# Patient Record
Sex: Female | Born: 1940
Health system: Southern US, Community
[De-identification: ages and names within clinical notes are randomized; demographics above are authoritative.]

## PROBLEM LIST (undated history)

## (undated) DIAGNOSIS — Z1211 Encounter for screening for malignant neoplasm of colon: Secondary | ICD-10-CM

## (undated) DIAGNOSIS — T7840XA Allergy, unspecified, initial encounter: Secondary | ICD-10-CM

## (undated) DIAGNOSIS — M8580 Other specified disorders of bone density and structure, unspecified site: Secondary | ICD-10-CM

## (undated) DIAGNOSIS — D721 Eosinophilia: Secondary | ICD-10-CM

## (undated) DIAGNOSIS — J45909 Unspecified asthma, uncomplicated: Secondary | ICD-10-CM

## (undated) DIAGNOSIS — M810 Age-related osteoporosis without current pathological fracture: Secondary | ICD-10-CM

## (undated) DIAGNOSIS — R011 Cardiac murmur, unspecified: Secondary | ICD-10-CM

## (undated) DIAGNOSIS — J309 Allergic rhinitis, unspecified: Secondary | ICD-10-CM

## (undated) DIAGNOSIS — I10 Essential (primary) hypertension: Secondary | ICD-10-CM

## (undated) DIAGNOSIS — C4492 Squamous cell carcinoma of skin, unspecified: Secondary | ICD-10-CM

## (undated) DIAGNOSIS — E538 Deficiency of other specified B group vitamins: Secondary | ICD-10-CM

## (undated) DIAGNOSIS — H269 Unspecified cataract: Secondary | ICD-10-CM

## (undated) DIAGNOSIS — C50312 Malignant neoplasm of lower-inner quadrant of left female breast: Secondary | ICD-10-CM

## (undated) DIAGNOSIS — C4491 Basal cell carcinoma of skin, unspecified: Secondary | ICD-10-CM

## (undated) DIAGNOSIS — I493 Ventricular premature depolarization: Secondary | ICD-10-CM

## (undated) DIAGNOSIS — D219 Benign neoplasm of connective and other soft tissue, unspecified: Secondary | ICD-10-CM

## (undated) DIAGNOSIS — C50919 Malignant neoplasm of unspecified site of unspecified female breast: Secondary | ICD-10-CM

## (undated) DIAGNOSIS — Z8719 Personal history of other diseases of the digestive system: Secondary | ICD-10-CM

## (undated) DIAGNOSIS — E785 Hyperlipidemia, unspecified: Secondary | ICD-10-CM

## (undated) HISTORY — DX: Age-related osteoporosis without current pathological fracture: M81.0

## (undated) HISTORY — DX: Basal cell carcinoma of skin, unspecified: C44.91

## (undated) HISTORY — DX: Hyperlipidemia, unspecified: E78.5

## (undated) HISTORY — PX: BREAST SURGERY: SHX581

## (undated) HISTORY — PX: OTHER SURGICAL HISTORY: SHX169

## (undated) HISTORY — DX: Eosinophilia: D72.1

## (undated) HISTORY — PX: FOOT SURGERY: SHX648

## (undated) HISTORY — DX: Essential (primary) hypertension: I10

## (undated) HISTORY — DX: Allergy, unspecified, initial encounter: T78.40XA

## (undated) HISTORY — DX: Malignant neoplasm of lower-inner quadrant of left female breast: C50.312

## (undated) HISTORY — PX: KNEE ARTHROSCOPY: SUR90

## (undated) HISTORY — DX: Squamous cell carcinoma of skin, unspecified: C44.92

## (undated) HISTORY — DX: Personal history of other diseases of the digestive system: Z87.19

## (undated) HISTORY — DX: Ventricular premature depolarization: I49.3

## (undated) HISTORY — DX: Malignant neoplasm of unspecified site of unspecified female breast: C50.919

## (undated) HISTORY — DX: Other specified disorders of bone density and structure, unspecified site: M85.80

## (undated) HISTORY — DX: Benign neoplasm of connective and other soft tissue, unspecified: D21.9

## (undated) HISTORY — PX: NEUROMA SURGERY: SHX722

## (undated) HISTORY — DX: Unspecified cataract: H26.9

## (undated) HISTORY — DX: Cardiac murmur, unspecified: R01.1

## (undated) HISTORY — DX: Allergic rhinitis, unspecified: J30.9

## (undated) HISTORY — DX: Deficiency of other specified B group vitamins: E53.8

## (undated) HISTORY — DX: Encounter for screening for malignant neoplasm of colon: Z12.11

---

## 1998-05-02 ENCOUNTER — Other Ambulatory Visit: Admission: RE | Admit: 1998-05-02 | Discharge: 1998-05-02 | Payer: Self-pay | Admitting: *Deleted

## 1999-04-28 ENCOUNTER — Other Ambulatory Visit: Admission: RE | Admit: 1999-04-28 | Discharge: 1999-04-28 | Payer: Self-pay | Admitting: *Deleted

## 1999-04-29 ENCOUNTER — Ambulatory Visit (HOSPITAL_COMMUNITY): Admission: RE | Admit: 1999-04-29 | Discharge: 1999-04-29 | Payer: Self-pay | Admitting: Internal Medicine

## 1999-04-29 ENCOUNTER — Encounter: Payer: Self-pay | Admitting: Internal Medicine

## 2000-07-16 ENCOUNTER — Other Ambulatory Visit: Admission: RE | Admit: 2000-07-16 | Discharge: 2000-07-16 | Payer: Self-pay | Admitting: *Deleted

## 2001-01-25 ENCOUNTER — Encounter: Admission: RE | Admit: 2001-01-25 | Discharge: 2001-01-25 | Payer: Self-pay | Admitting: *Deleted

## 2001-01-25 ENCOUNTER — Encounter: Payer: Self-pay | Admitting: *Deleted

## 2002-08-24 ENCOUNTER — Encounter: Payer: Self-pay | Admitting: Internal Medicine

## 2003-05-02 ENCOUNTER — Other Ambulatory Visit: Admission: RE | Admit: 2003-05-02 | Discharge: 2003-05-02 | Payer: Self-pay | Admitting: Internal Medicine

## 2003-06-02 HISTORY — PX: COLONOSCOPY: SHX174

## 2003-06-13 ENCOUNTER — Encounter: Payer: Self-pay | Admitting: Internal Medicine

## 2003-06-13 LAB — HM COLONOSCOPY

## 2004-10-07 ENCOUNTER — Ambulatory Visit: Payer: Self-pay | Admitting: Internal Medicine

## 2004-10-07 ENCOUNTER — Other Ambulatory Visit: Admission: RE | Admit: 2004-10-07 | Discharge: 2004-10-07 | Payer: Self-pay | Admitting: Internal Medicine

## 2004-10-21 ENCOUNTER — Ambulatory Visit: Payer: Self-pay | Admitting: Internal Medicine

## 2004-12-15 ENCOUNTER — Ambulatory Visit: Payer: Self-pay | Admitting: Internal Medicine

## 2005-03-30 ENCOUNTER — Ambulatory Visit: Payer: Self-pay | Admitting: Internal Medicine

## 2005-05-22 ENCOUNTER — Ambulatory Visit: Payer: Self-pay | Admitting: Internal Medicine

## 2005-07-06 ENCOUNTER — Ambulatory Visit: Payer: Self-pay | Admitting: Internal Medicine

## 2005-07-24 ENCOUNTER — Ambulatory Visit: Payer: Self-pay | Admitting: Internal Medicine

## 2005-07-29 ENCOUNTER — Ambulatory Visit: Payer: Self-pay | Admitting: Internal Medicine

## 2005-08-03 ENCOUNTER — Ambulatory Visit: Payer: Self-pay | Admitting: Cardiology

## 2005-10-27 ENCOUNTER — Ambulatory Visit: Payer: Self-pay | Admitting: Internal Medicine

## 2005-11-26 ENCOUNTER — Ambulatory Visit: Payer: Self-pay | Admitting: Internal Medicine

## 2005-11-26 ENCOUNTER — Other Ambulatory Visit: Admission: RE | Admit: 2005-11-26 | Discharge: 2005-11-26 | Payer: Self-pay | Admitting: Internal Medicine

## 2005-11-26 ENCOUNTER — Encounter: Payer: Self-pay | Admitting: Internal Medicine

## 2005-11-26 LAB — CONVERTED CEMR LAB: Pap Smear: NORMAL

## 2006-05-10 ENCOUNTER — Ambulatory Visit: Payer: Self-pay | Admitting: Internal Medicine

## 2006-05-19 ENCOUNTER — Ambulatory Visit: Payer: Self-pay | Admitting: Internal Medicine

## 2006-05-21 ENCOUNTER — Encounter: Payer: Self-pay | Admitting: Internal Medicine

## 2006-06-16 ENCOUNTER — Ambulatory Visit: Payer: Self-pay | Admitting: Internal Medicine

## 2006-12-15 ENCOUNTER — Ambulatory Visit: Payer: Self-pay | Admitting: Internal Medicine

## 2006-12-20 DIAGNOSIS — M810 Age-related osteoporosis without current pathological fracture: Secondary | ICD-10-CM

## 2006-12-20 DIAGNOSIS — E785 Hyperlipidemia, unspecified: Secondary | ICD-10-CM | POA: Insufficient documentation

## 2006-12-20 DIAGNOSIS — M8580 Other specified disorders of bone density and structure, unspecified site: Secondary | ICD-10-CM | POA: Insufficient documentation

## 2006-12-20 HISTORY — DX: Age-related osteoporosis without current pathological fracture: M81.0

## 2006-12-20 HISTORY — DX: Hyperlipidemia, unspecified: E78.5

## 2007-01-12 ENCOUNTER — Ambulatory Visit: Payer: Self-pay | Admitting: Internal Medicine

## 2007-01-12 LAB — CONVERTED CEMR LAB
ALT: 17 units/L (ref 0–35)
AST: 20 units/L (ref 0–37)
Albumin: 4 g/dL (ref 3.5–5.2)
Alkaline Phosphatase: 55 units/L (ref 39–117)
BUN: 12 mg/dL (ref 6–23)
Basophils Absolute: 0 10*3/uL (ref 0.0–0.1)
Basophils Relative: 0.2 % (ref 0.0–1.0)
Bilirubin Urine: NEGATIVE
Bilirubin, Direct: 0.1 mg/dL (ref 0.0–0.3)
Blood in Urine, dipstick: NEGATIVE
CO2: 28 meq/L (ref 19–32)
Calcium: 9.4 mg/dL (ref 8.4–10.5)
Chloride: 107 meq/L (ref 96–112)
Cholesterol: 219 mg/dL (ref 0–200)
Creatinine, Ser: 1 mg/dL (ref 0.4–1.2)
Direct LDL: 140.8 mg/dL
Eosinophils Absolute: 0.3 10*3/uL (ref 0.0–0.6)
Eosinophils Relative: 5.9 % — ABNORMAL HIGH (ref 0.0–5.0)
GFR calc Af Amer: 71 mL/min
GFR calc non Af Amer: 59 mL/min
Glucose, Bld: 95 mg/dL (ref 70–99)
Glucose, Urine, Semiquant: NEGATIVE
HCT: 37.5 % (ref 36.0–46.0)
HDL: 55.9 mg/dL (ref >39.0)
Hemoglobin: 13.1 g/dL (ref 12.0–15.0)
Ketones, urine, test strip: NEGATIVE
Lymphocytes Relative: 27 % (ref 12.0–46.0)
MCHC: 35 g/dL (ref 30.0–36.0)
MCV: 90.1 fL (ref 78.0–100.0)
Monocytes Absolute: 0.4 10*3/uL (ref 0.2–0.7)
Monocytes Relative: 7.6 % (ref 3.0–11.0)
Neutro Abs: 2.9 10*3/uL (ref 1.4–7.7)
Neutrophils Relative %: 59.3 % (ref 43.0–77.0)
Nitrite: NEGATIVE
Platelets: 292 10*3/uL (ref 150–400)
Potassium: 4 meq/L (ref 3.5–5.1)
Protein, U semiquant: NEGATIVE
RBC: 4.16 M/uL (ref 3.87–5.11)
RDW: 12.1 % (ref 11.5–14.6)
Sodium: 141 meq/L (ref 135–145)
Specific Gravity, Urine: 1.02
TSH: 2.13 microintl units/mL (ref 0.35–5.50)
Total Bilirubin: 1 mg/dL (ref 0.3–1.2)
Total CHOL/HDL Ratio: 3.9
Total Protein: 6.5 g/dL (ref 6.0–8.3)
Triglycerides: 113 mg/dL (ref 0–149)
Urobilinogen, UA: 0.2
VLDL: 23 mg/dL (ref 0–40)
WBC Urine, dipstick: NEGATIVE
WBC: 5 10*3/uL (ref 4.5–10.5)
pH: 6

## 2007-01-19 ENCOUNTER — Encounter: Payer: Self-pay | Admitting: Internal Medicine

## 2007-01-19 ENCOUNTER — Ambulatory Visit: Payer: Self-pay | Admitting: Internal Medicine

## 2007-02-24 ENCOUNTER — Telehealth: Payer: Self-pay | Admitting: Internal Medicine

## 2007-04-14 ENCOUNTER — Telehealth (INDEPENDENT_AMBULATORY_CARE_PROVIDER_SITE_OTHER): Payer: Self-pay | Admitting: *Deleted

## 2007-04-20 ENCOUNTER — Ambulatory Visit: Payer: Self-pay | Admitting: Internal Medicine

## 2007-04-20 ENCOUNTER — Encounter: Payer: Self-pay | Admitting: Internal Medicine

## 2007-07-12 ENCOUNTER — Ambulatory Visit: Payer: Self-pay | Admitting: Internal Medicine

## 2007-08-31 ENCOUNTER — Telehealth: Payer: Self-pay | Admitting: Internal Medicine

## 2007-09-30 ENCOUNTER — Encounter: Payer: Self-pay | Admitting: Internal Medicine

## 2007-09-30 LAB — HM MAMMOGRAPHY: HM Mammogram: NORMAL

## 2007-10-27 ENCOUNTER — Ambulatory Visit: Payer: Self-pay | Admitting: Internal Medicine

## 2008-01-23 ENCOUNTER — Ambulatory Visit: Payer: Self-pay | Admitting: Internal Medicine

## 2008-01-23 LAB — CONVERTED CEMR LAB
AST: 21 units/L (ref 0–37)
Albumin: 4 g/dL (ref 3.5–5.2)
BUN: 11 mg/dL (ref 6–23)
Basophils Relative: 1 % (ref 0.0–3.0)
Creatinine, Ser: 1 mg/dL (ref 0.4–1.2)
Direct LDL: 159.1 mg/dL
Eosinophils Absolute: 0.5 10*3/uL (ref 0.0–0.7)
Eosinophils Relative: 9.3 % — ABNORMAL HIGH (ref 0.0–5.0)
GFR calc Af Amer: 71 mL/min
GFR calc non Af Amer: 59 mL/min
HCT: 39.9 % (ref 36.0–46.0)
Hemoglobin: 13.8 g/dL (ref 12.0–15.0)
Ketones, urine, test strip: NEGATIVE
MCV: 91.8 fL (ref 78.0–100.0)
Monocytes Absolute: 0.4 10*3/uL (ref 0.1–1.0)
Neutro Abs: 2.5 10*3/uL (ref 1.4–7.7)
Nitrite: NEGATIVE
RBC: 4.35 M/uL (ref 3.87–5.11)
Specific Gravity, Urine: 1.015
Total Protein: 6.8 g/dL (ref 6.0–8.3)
Triglycerides: 125 mg/dL (ref 0–149)
WBC Urine, dipstick: NEGATIVE
WBC: 5.1 10*3/uL (ref 4.5–10.5)

## 2008-01-31 ENCOUNTER — Ambulatory Visit: Payer: Self-pay | Admitting: Internal Medicine

## 2008-01-31 DIAGNOSIS — J309 Allergic rhinitis, unspecified: Secondary | ICD-10-CM

## 2008-01-31 HISTORY — DX: Allergic rhinitis, unspecified: J30.9

## 2008-03-20 ENCOUNTER — Telehealth: Payer: Self-pay | Admitting: Internal Medicine

## 2008-03-22 ENCOUNTER — Ambulatory Visit: Payer: Self-pay | Admitting: Internal Medicine

## 2008-03-26 ENCOUNTER — Telehealth: Payer: Self-pay | Admitting: Internal Medicine

## 2008-04-24 ENCOUNTER — Ambulatory Visit: Payer: Self-pay | Admitting: Internal Medicine

## 2008-05-14 ENCOUNTER — Ambulatory Visit: Payer: Self-pay | Admitting: Internal Medicine

## 2008-05-17 ENCOUNTER — Ambulatory Visit: Payer: Self-pay | Admitting: Cardiology

## 2008-06-04 ENCOUNTER — Ambulatory Visit: Payer: Self-pay | Admitting: Internal Medicine

## 2008-06-07 ENCOUNTER — Encounter: Payer: Self-pay | Admitting: Internal Medicine

## 2008-06-26 ENCOUNTER — Encounter: Payer: Self-pay | Admitting: Internal Medicine

## 2008-09-07 ENCOUNTER — Ambulatory Visit: Payer: Self-pay | Admitting: Family Medicine

## 2008-09-07 DIAGNOSIS — Z8719 Personal history of other diseases of the digestive system: Secondary | ICD-10-CM | POA: Insufficient documentation

## 2008-09-07 HISTORY — DX: Personal history of other diseases of the digestive system: Z87.19

## 2008-09-07 LAB — CONVERTED CEMR LAB
Bilirubin Urine: NEGATIVE
Glucose, Urine, Semiquant: NEGATIVE
Urobilinogen, UA: 0.2
WBC Urine, dipstick: NEGATIVE

## 2008-10-01 ENCOUNTER — Encounter: Payer: Self-pay | Admitting: Internal Medicine

## 2008-10-03 ENCOUNTER — Encounter: Payer: Self-pay | Admitting: Internal Medicine

## 2009-01-30 ENCOUNTER — Ambulatory Visit: Payer: Self-pay | Admitting: Internal Medicine

## 2009-01-30 LAB — CONVERTED CEMR LAB
ALT: 18 units/L (ref 0–35)
Albumin: 4 g/dL (ref 3.5–5.2)
Basophils Relative: 0.9 % (ref 0.0–3.0)
CO2: 28 meq/L (ref 19–32)
Chloride: 108 meq/L (ref 96–112)
Cholesterol: 224 mg/dL — ABNORMAL HIGH (ref 0–200)
Creatinine, Ser: 1 mg/dL (ref 0.4–1.2)
Direct LDL: 145 mg/dL
Eosinophils Absolute: 0.6 10*3/uL (ref 0.0–0.7)
Eosinophils Relative: 10.8 % — ABNORMAL HIGH (ref 0.0–5.0)
HCT: 40.5 % (ref 36.0–46.0)
Hemoglobin: 14 g/dL (ref 12.0–15.0)
Lymphs Abs: 1.3 10*3/uL (ref 0.7–4.0)
MCHC: 34.5 g/dL (ref 30.0–36.0)
MCV: 92.2 fL (ref 78.0–100.0)
Monocytes Absolute: 0.4 10*3/uL (ref 0.1–1.0)
Neutro Abs: 2.8 10*3/uL (ref 1.4–7.7)
Neutrophils Relative %: 54.8 % (ref 43.0–77.0)
Potassium: 4.5 meq/L (ref 3.5–5.1)
RBC: 4.39 M/uL (ref 3.87–5.11)
TSH: 2.06 microintl units/mL (ref 0.35–5.50)
Total CHOL/HDL Ratio: 4
Total Protein: 6.6 g/dL (ref 6.0–8.3)
WBC: 5.1 10*3/uL (ref 4.5–10.5)

## 2009-02-08 ENCOUNTER — Ambulatory Visit: Payer: Self-pay | Admitting: Internal Medicine

## 2009-02-08 DIAGNOSIS — D721 Eosinophilia, unspecified: Secondary | ICD-10-CM

## 2009-02-08 HISTORY — DX: Eosinophilia, unspecified: D72.10

## 2009-02-21 ENCOUNTER — Ambulatory Visit: Payer: Self-pay | Admitting: Internal Medicine

## 2009-03-08 ENCOUNTER — Telehealth: Payer: Self-pay | Admitting: Internal Medicine

## 2009-03-08 ENCOUNTER — Ambulatory Visit: Payer: Self-pay | Admitting: Internal Medicine

## 2009-03-09 ENCOUNTER — Encounter: Payer: Self-pay | Admitting: Internal Medicine

## 2009-03-13 ENCOUNTER — Ambulatory Visit: Payer: Self-pay | Admitting: Internal Medicine

## 2009-03-13 ENCOUNTER — Telehealth: Payer: Self-pay | Admitting: Internal Medicine

## 2009-03-14 LAB — CONVERTED CEMR LAB
Basophils Absolute: 0 10*3/uL (ref 0.0–0.1)
Eosinophils Relative: 11.8 % — ABNORMAL HIGH (ref 0.0–5.0)
HCT: 39.8 % (ref 36.0–46.0)
Lymphs Abs: 1.6 10*3/uL (ref 0.7–4.0)
Monocytes Relative: 6.7 % (ref 3.0–12.0)
Neutrophils Relative %: 51.4 % (ref 43.0–77.0)
Platelets: 264 10*3/uL (ref 150.0–400.0)
RDW: 12 % (ref 11.5–14.6)
WBC: 5.2 10*3/uL (ref 4.5–10.5)

## 2009-03-18 ENCOUNTER — Encounter: Payer: Self-pay | Admitting: Internal Medicine

## 2009-04-04 ENCOUNTER — Encounter: Payer: Self-pay | Admitting: Internal Medicine

## 2009-06-27 ENCOUNTER — Encounter: Payer: Self-pay | Admitting: Internal Medicine

## 2009-06-27 ENCOUNTER — Ambulatory Visit: Payer: Self-pay | Admitting: Internal Medicine

## 2009-10-02 ENCOUNTER — Encounter: Payer: Self-pay | Admitting: Internal Medicine

## 2009-10-07 ENCOUNTER — Encounter: Payer: Self-pay | Admitting: Internal Medicine

## 2009-10-08 ENCOUNTER — Encounter: Payer: Self-pay | Admitting: Family Medicine

## 2009-10-21 ENCOUNTER — Ambulatory Visit: Payer: Self-pay | Admitting: Internal Medicine

## 2009-10-30 ENCOUNTER — Ambulatory Visit: Payer: Self-pay | Admitting: Internal Medicine

## 2009-11-19 ENCOUNTER — Ambulatory Visit: Payer: Self-pay | Admitting: Internal Medicine

## 2010-01-29 ENCOUNTER — Ambulatory Visit: Payer: Self-pay | Admitting: Internal Medicine

## 2010-01-29 LAB — CONVERTED CEMR LAB
ALT: 18 units/L (ref 0–35)
BUN: 13 mg/dL (ref 6–23)
Basophils Absolute: 0 10*3/uL (ref 0.0–0.1)
Bilirubin Urine: NEGATIVE
Blood in Urine, dipstick: NEGATIVE
Chloride: 106 meq/L (ref 96–112)
Cholesterol: 231 mg/dL — ABNORMAL HIGH (ref 0–200)
Creatinine, Ser: 1 mg/dL (ref 0.4–1.2)
Direct LDL: 156.5 mg/dL
Eosinophils Absolute: 0.5 10*3/uL (ref 0.0–0.7)
Glucose, Bld: 90 mg/dL (ref 70–99)
Glucose, Urine, Semiquant: NEGATIVE
HCT: 38.5 % (ref 36.0–46.0)
Lymphs Abs: 1.5 10*3/uL (ref 0.7–4.0)
MCHC: 34.9 g/dL (ref 30.0–36.0)
MCV: 92.2 fL (ref 78.0–100.0)
Monocytes Absolute: 0.4 10*3/uL (ref 0.1–1.0)
Neutrophils Relative %: 50.2 % (ref 43.0–77.0)
Platelets: 256 10*3/uL (ref 150.0–400.0)
Potassium: 4.1 meq/L (ref 3.5–5.1)
Protein, U semiquant: NEGATIVE
RDW: 13.4 % (ref 11.5–14.6)
Specific Gravity, Urine: 1.015
TSH: 3.75 microintl units/mL (ref 0.35–5.50)
Total Bilirubin: 0.8 mg/dL (ref 0.3–1.2)
WBC Urine, dipstick: NEGATIVE
pH: 6.5

## 2010-02-10 ENCOUNTER — Ambulatory Visit: Payer: Self-pay | Admitting: Internal Medicine

## 2010-04-07 ENCOUNTER — Ambulatory Visit: Payer: Self-pay | Admitting: Internal Medicine

## 2010-04-16 ENCOUNTER — Encounter: Payer: Self-pay | Admitting: Internal Medicine

## 2010-04-29 ENCOUNTER — Telehealth: Payer: Self-pay | Admitting: Internal Medicine

## 2010-07-01 NOTE — Assessment & Plan Note (Signed)
Summary: cyst on back/dm   Vital Signs:  Patient profile:   70 year old female Menstrual status:  postmenopausal Pulse rate:   80 / minute Pulse rhythm:   regular Resp:     12 per minute BP sitting:   156 / 72  (left arm) Cuff size:   regular  Vitals Entered By: Gladis Riffle, RN (Oct 21, 2009 11:10 AM)  Procedure Note Last Tetanus: Td (07/02/2002)  Incision & Drainage: Onset of lesion: weeks---proggressive  Procedure # 1: I & D with drain    Size (in cm): 2.0 x 2.0    Location: back-lower-left    Comment: verbal consent    Instrument used: #10 blade    Anesthesia: 1% lidocaine w/epinephrine    Closure: simple interrupted  CC: c/o cyst on back x 1 year, has increased in size Is Patient Diabetic? No   CC:  c/o cyst on back x 1 year and has increased in size.  History of Present Illness: enlarging cyst on back tender to touch no fever redness has developed  no fever or chills All other systems reviewed and were negative   Preventive Screening-Counseling & Management  Alcohol-Tobacco     Smoking Status: never  Current Problems (verified): 1)  Eosinophilia  (ICD-288.3) 2)  Diverticulitis, Hx of  (ICD-V12.79) 3)  Allergic Rhinitis  (ICD-477.9) 4)  Physical Examination, Normal  (ICD-V70.0) 5)  Osteoporosis  (ICD-733.00) 6)  Hyperlipidemia  (ICD-272.4)  Current Medications (verified): 1)  Calcium 600 600 Mg Tabs (Calcium Carbonate) .... 2 Every Am 2)  Vitamin D 400 Unit Tabs (Cholecalciferol) .... Two Every Am  Allergies: 1)  ! Penicillin 2)  ! Sulfa  Past History:  Past Medical History: Last updated: 09/07/2008 Hyperlipidemia  increased HDL Osteoporosis palpitations--SVT Allergic rhinitis Diverticulitis, hx of  Past Surgical History: Last updated: 12/20/2006 thyroid duct cyst arthroscopy L knee neuroma X2 feet  Family History: Last updated: 01/31/2008 mother brain tumor 61 yo father deceased age 14--copd (cad-stent age 44)  Social  History: Last updated: 01/19/2007 Retired Never Smoked Alcohol use-no  Regular exercise-yes  Risk Factors: Exercise: yes (01/19/2007)  Risk Factors: Smoking Status: never (10/21/2009)  Physical Exam  General:  Well-developed,well-nourished,in no acute distress; alert,appropriate and cooperative throughout examination Head:  normocephalic and atraumatic.   Skin:  2 cm cyst wiht erythema and some induration on back  tender to touch   Impression & Recommendations:  Problem # 1:  CELLULITIS/ABSCESS NOS (ICD-682.9)  enlarging needs treatment disucssed options she agrees to proceed with  Her updated medication list for this problem includes:    Doxycycline Hyclate 100 Mg Caps (Doxycycline hyclate) .Marland Kitchen... Take 1 tab twice a day , use sun screen if going to be outside  Orders: I&D Abscess, Complex (10061) I&D Abscess, Simple / Single (10060)  Complete Medication List: 1)  Calcium 600 600 Mg Tabs (Calcium carbonate) .... 2 every am 2)  Vitamin D 400 Unit Tabs (Cholecalciferol) .... Two every am 3)  Doxycycline Hyclate 100 Mg Caps (Doxycycline hyclate) .... Take 1 tab twice a day , use sun screen if going to be outside Prescriptions: DOXYCYCLINE HYCLATE 100 MG CAPS (DOXYCYCLINE HYCLATE) Take 1 tab twice a day , use sun screen if going to be outside  #20 x 0   Entered and Authorized by:   Birdie Sons MD   Signed by:   Birdie Sons MD on 10/21/2009   Method used:   Electronically to        [facility]  [place]  [address] [phone]* (retail)       [address]       [place], [place]  [address]       Ph: [phone] or [phone]       Fax: [phone]   RxID:   [id number]

## 2010-07-01 NOTE — Assessment & Plan Note (Signed)
Summary: check cyst//ccm   Vital Signs:  Patient profile:   70 year old female Menstrual status:  postmenopausal Pulse rate:   76 / minute Pulse rhythm:   regular Resp:     12 per minute BP sitting:   136 / 68  (left arm) Cuff size:   regular  Vitals Entered By: Gladis Riffle, RN (November 19, 2009 9:22 AM) CC: recheck of cyst on back, continues to have pain at times and draining some Is Patient Diabetic? No   CC:  recheck of cyst on back and continues to have pain at times and draining some.  Preventive Screening-Counseling & Management  Alcohol-Tobacco     Smoking Status: never  Current Medications (verified): 1)  Calcium 600 600 Mg Tabs (Calcium Carbonate) .... 2 Every Am 2)  Vitamin D 400 Unit Tabs (Cholecalciferol) .... Two Every Am  Allergies: 1)  ! Penicillin 2)  ! Sulfa   Impression & Recommendations:  Problem # 1:  CELLULITIS/ABSCESS NOS (ICD-682.9)  area on back with minimal drainage advised to keep area clean advised to apply moist heat several times daily call if any change  Orders: No Charge Patient Arrived (NCPA0) (NCPA0)  Complete Medication List: 1)  Calcium 600 600 Mg Tabs (Calcium carbonate) .... 2 every am 2)  Vitamin D 400 Unit Tabs (Cholecalciferol) .... Two every am

## 2010-07-01 NOTE — Assessment & Plan Note (Signed)
Summary: cpx//ccm   Vital Signs:  Patient profile:   70 year old female Menstrual status:  postmenopausal Height:      62.75 inches Weight:      144 pounds BMI:     25.81 Temp:     98.9 degrees F oral BP sitting:   120 / 84  (left arm) Cuff size:   regular  Vitals Entered By: Kern Reap CMA Duncan Dull) (February 10, 2010 8:51 AM) CC: annual wellness exam Is Patient Diabetic? No Pain Assessment Patient in pain? no        CC:  annual wellness exam.  History of Present Illness: cpx  Current Problems (verified): 1)  Eosinophilia  (ICD-288.3) 2)  Diverticulitis, Hx of  (ICD-V12.79) 3)  Allergic Rhinitis  (ICD-477.9) 4)  Physical Examination, Normal  (ICD-V70.0) 5)  Osteoporosis  (ICD-733.00) 6)  Hyperlipidemia  (ICD-272.4)  Current Medications (verified): 1)  Calcium 600 600 Mg Tabs (Calcium Carbonate) .... 2 Every Am 2)  Vitamin D 400 Unit Tabs (Cholecalciferol) .... Two Every Am  Allergies: 1)  ! Penicillin 2)  ! Sulfa  Past History:  Past Medical History: Last updated: 09/07/2008 Hyperlipidemia  increased HDL Osteoporosis palpitations--SVT Allergic rhinitis Diverticulitis, hx of  Past Surgical History: Last updated: 12/20/2006 thyroid duct cyst arthroscopy L knee neuroma X2 feet  Family History: Last updated: 01/31/2008 mother brain tumor 65 yo father deceased age 24--copd (cad-stent age 41)  Social History: Last updated: 01/19/2007 Retired Never Smoked Alcohol use-no  Regular exercise-yes  Risk Factors: Exercise: yes (01/19/2007)  Risk Factors: Smoking Status: never (11/19/2009)  Physical Exam  General:  alert and well-developed.   Head:  normocephalic and atraumatic.   Eyes:  pupils equal and pupils round.   Ears:  R ear normal and L ear normal.   Nose:  no external deformity and no external erythema.   Neck:  No deformities, masses, or tenderness noted. Chest Wall:  No deformities, masses, or tenderness noted. Lungs:  normal  respiratory effort and no intercostal retractions.   Heart:  normal rate and regular rhythm.   Abdomen:  soft and non-tender.   Msk:  No deformity or scoliosis noted of thoracic or lumbar spine.   Pulses:  R and L carotid,radial,femoral,dorsalis pedis and posterior tibial pulses are full and equal bilaterally Neurologic:  cranial nerves II-XII intact and gait normal.   Skin:  turgor normal and color normal.   Psych:  normally interactive and good eye contact.     Impression & Recommendations:  Problem # 1:  PHYSICAL EXAMINATION, NORMAL (ICD-V70.0) Health Maint UTD zostavax today continued excellent helath habits (exercise)  Complete Medication List: 1)  Calcium 600 600 Mg Tabs (Calcium carbonate) .... 2 every am 2)  Vitamin D 400 Unit Tabs (Cholecalciferol) .... Two every am  Other Orders: Zoster (Shingles) Vaccine Live [phone]) Admin 1st Vaccine (66440)  Contraindications/Deferment of Procedures/Staging:    Test/Procedure: PAP Smear    Reason for deferment: not indicated    Zostavax (to be given today)

## 2010-07-01 NOTE — Assessment & Plan Note (Signed)
Summary: ?ALLERIES/NJR   Vital Signs:  Patient profile:   70 year old female Menstrual status:  postmenopausal Weight:      146 pounds Temp:     99.0 degrees F oral BP sitting:   142 / 72  (left arm) Cuff size:   regular  Vitals Entered By: Alfred Levins, CMA (April 07, 2010 10:23 AM) CC: sinus pressure, chest sore x1 mth   CC:  sinus pressure and chest sore x1 mth.  History of Present Illness: off and on ST for 3 weeks scrathcy throat  chest soreness for 3 weeks. Sore to touch. No SOB, no exertional chest pain she exercises regularly  some nasal congestion--no real relief from mucinex dm---some relief with allegra  Current Medications (verified): 1)  Calcium 600 600 Mg Tabs (Calcium Carbonate) .Marland Kitchen.. 1 By Mouth Every Morning and 1 By Mouth Every Night 2)  Vitamin D 400 Unit Tabs (Cholecalciferol) .Marland Kitchen.. 1 By Mouth Every Morning and 1 By Mouth Every Night  Allergies (verified): 1)  ! Penicillin 2)  ! Sulfa  Past History:  Past Medical History: Last updated: 09/07/2008 Hyperlipidemia  increased HDL Osteoporosis palpitations--SVT Allergic rhinitis Diverticulitis, hx of  Past Surgical History: Last updated: 12/20/2006 thyroid duct cyst arthroscopy L knee neuroma X2 feet  Family History: Last updated: 01/31/2008 mother brain tumor 68 yo father deceased age 26--copd (cad-stent age 43)  Social History: Last updated: 01/19/2007 Retired Never Smoked Alcohol use-no  Regular exercise-yes  Risk Factors: Exercise: yes (01/19/2007)  Risk Factors: Smoking Status: never (11/19/2009)  Review of Systems       no significant complaints. Specifically no chest pain, shortness breath, PND, orthopnea, rashes, abdominal pain, change in appetite. It's are normal.  Physical Exam  General:  well-developed well-nourished female. Chest clear to auscultation cardiac exam S1-S2 are regular. Abdominal exam active bowel sounds, soft. HEENT exam atraumatic, normocephalic,  nasal mucosa is moist and pink. Oral Marletta Lor is moist and pink.   Impression & Recommendations:  Problem # 1:  ALLERGIC RHINITIS (ICD-477.9) patient with recurrent allergic symptoms. Will try steroid nasal spray. Side effects discussed. She'll try over-the-counter antihistamines. she'll call me if her symptoms persist.  Complete Medication List: 1)  Calcium 600 600 Mg Tabs (Calcium carbonate) .Marland Kitchen.. 1 by mouth every morning and 1 by mouth every night 2)  Vitamin D 400 Unit Tabs (Cholecalciferol) .Marland Kitchen.. 1 by mouth every morning and 1 by mouth every night   Orders Added: 1)  Est. Patient Level III [[address]]

## 2010-07-01 NOTE — Assessment & Plan Note (Signed)
Summary: STITCH REMOVAL // RS   Allergies: 1)  ! Penicillin 2)  ! Sulfa   Impression & Recommendations:  Problem # 1:  CELLULITIS/ABSCESS NOS (ICD-682.9)  sutures removed site looks good ster strips applied  Orders: No Charge Patient Arrived (NCPA0) (NCPA0)  Complete Medication List: 1)  Calcium 600 600 Mg Tabs (Calcium carbonate) .... 2 every am 2)  Vitamin D 400 Unit Tabs (Cholecalciferol) .... Two every am

## 2010-07-01 NOTE — Progress Notes (Signed)
Summary: new rx  Phone Note Call from Patient   Caller: Patient Call For: Birdie Sons MD Summary of Call: PT was given sample of nasonex needs new rx call into [facility] [address] [phone] Initial call taken by: Heron Sabins,  April 29, 2010 10:44 AM  Follow-up for Phone Call        Phone Call Completed, Rx Called In Follow-up by: Alfred Levins, CMA,  April 29, 2010 10:51 AM    New/Updated Medications: NASONEX 50 MCG/ACT SUSP (MOMETASONE FUROATE) 1-2 each nostril qd Prescriptions: NASONEX 50 MCG/ACT SUSP (MOMETASONE FUROATE) 1-2 each nostril qd  #1 x 5   Entered by:   Alfred Levins, CMA   Authorized by:   Birdie Sons MD   Signed by:   Alfred Levins, CMA on 04/29/2010   Method used:   Electronically to        [facility] [address] [phone]* (retail)       [address]       [address]       [place], [place]  [address]       Ph: [phone]       Fax: [phone]   RxID:   [id number]

## 2010-07-01 NOTE — Miscellaneous (Signed)
Summary: Waiver of Liability for Zostavax  Waiver of Liability for Zostavax   Imported By: Maryln Gottron 02/13/2010 13:14:48  _____________________________________________________________________  External Attachment:    Type:   Image     Comment:   External Document

## 2010-07-01 NOTE — Miscellaneous (Signed)
Summary: BONE DENSITY  Clinical Lists Changes  Orders: Added new Test order of T-Bone Densitometry (77080) - Signed Added new Test order of T-Lumbar Vertebral Assessment (77082) - Signed 

## 2010-08-04 ENCOUNTER — Encounter: Payer: Self-pay | Admitting: Internal Medicine

## 2010-08-04 ENCOUNTER — Ambulatory Visit (INDEPENDENT_AMBULATORY_CARE_PROVIDER_SITE_OTHER): Payer: PRIVATE HEALTH INSURANCE | Admitting: Internal Medicine

## 2010-08-04 VITALS — BP 170/90 | HR 72 | Temp 98.3°F | Ht 62.5 in | Wt 146.0 lb

## 2010-08-04 DIAGNOSIS — R42 Dizziness and giddiness: Secondary | ICD-10-CM

## 2010-08-04 MED ORDER — MECLIZINE HCL 25 MG PO TABS: 25.0000 mg | ORAL_TABLET | Freq: Three times a day (TID) | ORAL | Status: DC | PRN

## 2010-08-04 NOTE — Assessment & Plan Note (Signed)
She describes typical sxs. Trial meclizine Call if sxs persist  May need vestibular rehab

## 2010-08-04 NOTE — Progress Notes (Signed)
  Subjective:    Patient ID: Lisa Greene, female    DOB: 08/24/1940, 70 y.o.   MRN: [id number]  HPI  Has had some dizziness over the past week She reports 10 years ago she had intermittent vertigo that spontaneously resolved. Last week she had an episode of vertigo---took dramamine with some relief but she has had increased sinus congestion and cough and mild vertigo. Overall has felt some fatigue. No other specific complaints  Past Medical History  Diagnosis Date  . ALLERGIC RHINITIS 01/31/2008  . DIVERTICULITIS, HX OF 09/07/2008  . Eosinophilia 02/08/2009  . HYPERLIPIDEMIA 12/20/2006  . OSTEOPOROSIS 12/20/2006   Past Surgical History  Procedure Date  . Thyroid duct cyst   . Knee arthroscopy     left  . Neuroma surgery     x2 feet    reports that she has never smoked. She does not have any smokeless tobacco history on file. She reports that she does not drink alcohol or use illicit drugs. family history includes COPD in her father and Heart disease in her father.    Allergies  Allergen Reactions  . Penicillins     REACTION: rash  . Sulfonamide Derivatives     REACTION: rash     Review of Systems  patient denies chest pain, shortness of breath, orthopnea. Denies lower extremity edema, abdominal pain, change in appetite, change in bowel movements. Patient denies rashes, musculoskeletal complaints. No other specific complaints in a complete review of systems.      Objective:   Physical Exam  Well-developed well-nourished female in no acute distress. HEENT exam atraumatic, normocephalic, extraocular muscles are intact. No nystagmus.  Neck is supple. No jugular venous distention no thyromegaly. Chest clear to auscultation without increased work of breathing. Cardiac exam S1 and S2 are regular. Abdominal exam active bowel sounds, soft, nontender. Extremities no edema. Neurologic exam she is alert without any motor sensory deficits. Gait is normal.        Assessment & Plan:     Vertigo. See assessment and plan. Side effects of new meds discussed Her blood pressure is also elevated the office today. She will monitor this at home. Goal blood pressure less than 140/90.

## 2010-09-01 ENCOUNTER — Ambulatory Visit: Payer: Self-pay | Admitting: Internal Medicine

## 2010-09-23 ENCOUNTER — Ambulatory Visit (INDEPENDENT_AMBULATORY_CARE_PROVIDER_SITE_OTHER): Payer: PRIVATE HEALTH INSURANCE | Admitting: Internal Medicine

## 2010-09-23 ENCOUNTER — Encounter: Payer: Self-pay | Admitting: Internal Medicine

## 2010-09-23 DIAGNOSIS — IMO0001 Reserved for inherently not codable concepts without codable children: Secondary | ICD-10-CM

## 2010-09-23 DIAGNOSIS — R030 Elevated blood-pressure reading, without diagnosis of hypertension: Secondary | ICD-10-CM

## 2010-09-23 DIAGNOSIS — J40 Bronchitis, not specified as acute or chronic: Secondary | ICD-10-CM

## 2010-09-23 MED ORDER — DOXYCYCLINE HYCLATE 100 MG PO TABS: 100.0000 mg | ORAL_TABLET | Freq: Two times a day (BID) | ORAL | Status: AC

## 2010-09-23 MED ORDER — ALBUTEROL 90 MCG/ACT IN AERS: 2.0000 | INHALATION_SPRAY | Freq: Four times a day (QID) | RESPIRATORY_TRACT | Status: DC | PRN

## 2010-09-23 NOTE — Progress Notes (Signed)
  Subjective:    Patient ID: Lisa Greene, female    DOB: [birth year], 70 y.o.   MRN: [id number]  HPI Pt presents to clinic for evaluation of cough and wheezing. Patient states four-day history cough intermittently productive without hemoptysis. No segment mild shortness of breath with associated wheezing. Nasal discharge yellow. Positive sick exposure. No alleviating or exacerbating factors. Taking no medication. No known history of asthma. Blood pressure elevated without symptoms. Recheck three hundred forty over eighty. Outpatient monitoring typically reveal systolic of 138-39. No other complaint.  Reviewed past medical history, medications and allergies.    Review of Systems see history of present illness     Objective:   Physical Exam  [nursing notereviewed. Constitutional: She appears well-developed and well-nourished. No distress.  HENT:  Head: Normocephalic and atraumatic.  Right Ear: Tympanic membrane, external ear and ear canal normal.  Left Ear: Tympanic membrane, external ear and ear canal normal.  Nose: Nose normal.  Mouth/Throat: Oropharynx is clear and moist. No oropharyngeal exudate.  Eyes: Conjunctivae are normal. No scleral icterus.  Neck: Neck supple.  Cardiovascular: Normal rate, regular rhythm and normal heart sounds.  Exam reveals no gallop and no friction rub.   No murmur heard. Pulmonary/Chest: Effort normal. No respiratory distress. She has wheezes. She has no rales.       + mild intermittent wheeze bilaterally with inspiration  Lymphadenopathy:    She has no cervical adenopathy.  Neurological: She is alert.  Skin: Skin is warm and dry. She is not diaphoretic.          Assessment & Plan:

## 2010-09-23 NOTE — Assessment & Plan Note (Signed)
Asx. Likely exacerbated by URI. Recommend outpt blood pressure log to be submitted for review

## 2010-09-23 NOTE — Assessment & Plan Note (Signed)
Begin abx tx. Albuterol MDI prn. Followup if no improvement or worsening. Present to ED if worsening dyspnea or wheezing.

## 2010-09-25 ENCOUNTER — Telehealth: Payer: Self-pay | Admitting: *Deleted

## 2010-09-25 NOTE — Telephone Encounter (Signed)
mucinex otc prn is good. Can add nasonex but flonase is cheaper. 2 sprays each nostril qhs #1 rf5. May take a few days to help.

## 2010-09-25 NOTE — Telephone Encounter (Signed)
Pt has been taking Allegra for nasal congestion, but is not helping.  Would like to change to Nasonex, Mucinex or something else that Dr. Ty Hilts would recommend.  [facility]

## 2010-09-25 NOTE — Telephone Encounter (Signed)
Notified pt. 

## 2010-10-20 ENCOUNTER — Encounter: Payer: Self-pay | Admitting: Internal Medicine

## 2010-10-28 ENCOUNTER — Encounter: Payer: Self-pay | Admitting: Internal Medicine

## 2010-11-03 ENCOUNTER — Encounter: Payer: Self-pay | Admitting: Internal Medicine

## 2010-11-18 ENCOUNTER — Observation Stay (HOSPITAL_COMMUNITY)
Admission: EM | Admit: 2010-11-18 | Discharge: 2010-11-19 | Disposition: A | Discharge status: Home / Self Care | Payer: PRIVATE HEALTH INSURANCE | Source: Ambulatory Visit | Attending: Family Medicine | Admitting: Family Medicine

## 2010-11-18 ENCOUNTER — Emergency Department (HOSPITAL_COMMUNITY): Payer: PRIVATE HEALTH INSURANCE

## 2010-11-18 DIAGNOSIS — R5381 Other malaise: Secondary | ICD-10-CM | POA: Insufficient documentation

## 2010-11-18 DIAGNOSIS — I498 Other specified cardiac arrhythmias: Secondary | ICD-10-CM | POA: Insufficient documentation

## 2010-11-18 DIAGNOSIS — K219 Gastro-esophageal reflux disease without esophagitis: Secondary | ICD-10-CM | POA: Insufficient documentation

## 2010-11-18 DIAGNOSIS — I10 Essential (primary) hypertension: Secondary | ICD-10-CM | POA: Insufficient documentation

## 2010-11-18 DIAGNOSIS — R110 Nausea: Secondary | ICD-10-CM | POA: Insufficient documentation

## 2010-11-18 DIAGNOSIS — R079 Chest pain, unspecified: Principal | ICD-10-CM | POA: Insufficient documentation

## 2010-11-18 LAB — HEPATIC FUNCTION PANEL
ALT: 17 U/L (ref 0–35)
AST: 22 U/L (ref 0–37)
Albumin: 3.8 g/dL (ref 3.5–5.2)
Total Bilirubin: 0.3 mg/dL (ref 0.3–1.2)

## 2010-11-18 LAB — DIFFERENTIAL
Basophils Absolute: 0 10*3/uL (ref 0.0–0.1)
Basophils Relative: 0 % (ref 0–1)
Eosinophils Relative: 13 % — ABNORMAL HIGH (ref 0–5)
Lymphocytes Relative: 30 % (ref 12–46)
Monocytes Absolute: 0.6 10*3/uL (ref 0.1–1.0)
Neutro Abs: 3.2 10*3/uL (ref 1.7–7.7)

## 2010-11-18 LAB — BASIC METABOLIC PANEL
CO2: 24 mEq/L (ref 19–32)
Chloride: 105 mEq/L (ref 96–112)
GFR calc non Af Amer: >60 mL/min (ref >60)
Glucose, Bld: 109 mg/dL — ABNORMAL HIGH (ref 70–99)
Potassium: 3.3 mEq/L — ABNORMAL LOW (ref 3.5–5.1)
Sodium: 140 mEq/L (ref 135–145)

## 2010-11-18 LAB — CBC
HCT: 38.6 % (ref 36.0–46.0)
MCHC: 35.8 g/dL (ref 30.0–36.0)
RDW: 12.6 % (ref 11.5–15.5)
WBC: 6.7 10*3/uL (ref 4.0–10.5)

## 2010-11-18 LAB — PRO B NATRIURETIC PEPTIDE: Pro B Natriuretic peptide (BNP): 390 pg/mL — ABNORMAL HIGH (ref 0–125)

## 2010-11-18 LAB — CARDIAC PANEL(CRET KIN+CKTOT+MB+TROPI)
CK, MB: 2 ng/mL (ref 0.3–4.0)
CK, MB: 2 ng/mL (ref 0.3–4.0)
Troponin I: <0.3 ng/mL (ref <0.30)

## 2010-11-19 DIAGNOSIS — I517 Cardiomegaly: Secondary | ICD-10-CM

## 2010-11-19 LAB — LIPID PANEL
Cholesterol: 231 mg/dL — ABNORMAL HIGH (ref 0–200)
HDL: 67 mg/dL (ref >39)
Total CHOL/HDL Ratio: 3.4 RATIO
Triglycerides: 127 mg/dL (ref <150)
VLDL: 25 mg/dL (ref 0–40)

## 2010-11-19 LAB — CBC
Hemoglobin: 13.3 g/dL (ref 12.0–15.0)
MCH: 30.4 pg (ref 26.0–34.0)
MCV: 86.3 fL (ref 78.0–100.0)
Platelets: 269 10*3/uL (ref 150–400)
RBC: 4.38 MIL/uL (ref 3.87–5.11)

## 2010-11-19 LAB — CARDIAC PANEL(CRET KIN+CKTOT+MB+TROPI): Relative Index: INVALID (ref 0.0–2.5)

## 2010-11-19 LAB — BASIC METABOLIC PANEL
CO2: 25 mEq/L (ref 19–32)
Chloride: 104 mEq/L (ref 96–112)
Sodium: 139 mEq/L (ref 135–145)

## 2010-11-25 ENCOUNTER — Ambulatory Visit (INDEPENDENT_AMBULATORY_CARE_PROVIDER_SITE_OTHER): Payer: PRIVATE HEALTH INSURANCE | Admitting: Internal Medicine

## 2010-11-25 ENCOUNTER — Encounter: Payer: Self-pay | Admitting: Internal Medicine

## 2010-11-25 DIAGNOSIS — I10 Essential (primary) hypertension: Secondary | ICD-10-CM

## 2010-11-25 NOTE — Progress Notes (Signed)
  Subjective:    Patient ID: Lisa Greene, female    DOB: 1940-08-31, 70 y.o.   MRN: [id number]  HPI Pt went to hospital---chest discomfort and htn Reviewed records She is now feeling well. No side effect on meds No CP, SOB, PND  Past Medical History  Diagnosis Date  . ALLERGIC RHINITIS 01/31/2008  . DIVERTICULITIS, HX OF 09/07/2008  . Eosinophilia 02/08/2009  . HYPERLIPIDEMIA 12/20/2006  . OSTEOPOROSIS 12/20/2006   Past Surgical History  Procedure Date  . Thyroid duct cyst   . Knee arthroscopy     left  . Neuroma surgery     x2 feet    reports that she has never smoked. She does not have any smokeless tobacco history on file. She reports that she does not drink alcohol or use illicit drugs. family history includes COPD in her father and Heart disease in her father. Allergies  Allergen Reactions  . Penicillins     REACTION: rash  . Sulfonamide Derivatives     REACTION: rash      Review of Systems  patient denies chest pain, shortness of breath, orthopnea. Denies lower extremity edema, abdominal pain, change in appetite, change in bowel movements. Patient denies rashes, musculoskeletal complaints. No other specific complaints in a complete review of systems.      Objective:   Physical Exam  Well-developed well-nourished female in no acute distress. HEENT exam atraumatic, normocephalic, extraocular muscles are intact. Neck is supple. No jugular venous distention no thyromegaly. Chest clear to auscultation without increased work of breathing. Cardiac exam S1 and S2 are regular. Abdominal exam active bowel sounds, soft, nontender. Extremities no edema. Neurologic exam she is alert without any motor sensory deficits. Gait is normal.        Assessment & Plan:

## 2010-11-25 NOTE — Assessment & Plan Note (Signed)
Reviewed ed record and echo report Home bps 117-139/66-72 Continue meds  See me one month

## 2010-11-25 NOTE — Progress Notes (Signed)
Addended by: Lindley Magnus on: 11/25/2010 09:50 AM   Modules accepted: Orders

## 2010-12-01 ENCOUNTER — Telehealth: Payer: Self-pay | Admitting: Internal Medicine

## 2010-12-01 NOTE — Telephone Encounter (Signed)
Pt called 7/2. States Dr. Cato Mulligan told her he would call her in a day or two. He has not. She would like a call.

## 2010-12-04 ENCOUNTER — Telehealth: Payer: Self-pay | Admitting: *Deleted

## 2010-12-04 NOTE — Telephone Encounter (Signed)
Notified pt that Dr. Cato Mulligan said not further evaluation is necessary.

## 2010-12-04 NOTE — Telephone Encounter (Signed)
No further evaluation necessary

## 2010-12-17 NOTE — H&P (Signed)
Lisa Greene, JAHN               ACCOUNT NO.:  [id number]  MEDICAL RECORD NO.:  [id number]  LOCATION:  [facility]                         FACILITY:  [facility]  PHYSICIAN:  Mauro Kaufmann, MD         DATE OF BIRTH:  May 06, 1941  DATE OF ADMISSION:  11/18/2010 DATE OF DISCHARGE:                             HISTORY & PHYSICAL   PRIMARY CARE DOCTOR:  Valetta Mole. Swords, MD  CHIEF COMPLAINT:  Chest pain.  HISTORY OF PRESENT ILLNESS:  This is a 70 year old female who came to the hospital after she had an episode of chest discomfort.  The patient says that she ate popcorn last night and after when she laid down, she experienced heaviness and tightness in the chest which was located in the epigastric region.  She woke up and she had feeling of indigestion, heart racing, nausea which was unusual for her.  The patient has no history of CAD.  The patient took aspirin at home and after that she came to the hospital.  The patient denies any loss of consciousness. Denies any blurred vision.  Denies any shortness of breath.  The patient though says that she was having some chills.  The patient denies any cough.  The patient says that she had a similar episode in the past but this unusual as it was associated with heart racing and nausea.  The patient is very active and has active lifestyle.  She goes to gym 4 times a week and also goes for long walks almost every day which led 2 miles every day.  She denies any dysuria, urgency, frequency of urination.  PAST MEDICAL HISTORY:  Significant for hypertension.  The patient currently is not taking any medication.  She says her blood pressure has been controlled without medication.  FAMILY HISTORY:  Nobody in the family has CAD.  SURGICAL HISTORY:  None.  SOCIAL HISTORY:  The patient is nonsmoker, nonalcoholic.  No history of illicit drug abuse.  ALLERGIES:  There are no known drug allergies.  MEDICATIONS:  Currently the patient is taking Flonase as  needed.  REVIEW OF SYSTEMS:  HEENT:  There is no headache, no blurred vision.  No runny nose or sore throat.  NECK:  No history of thyroid problems. CHEST:  No history of COPD, emphysema.  HEART:  No history of CAD.  GI: As in HPI.  GENITOURINARY:  As in HPI.  NEUROLOGICAL:  No history of stroke or TIA in the past.  No history of seizures.  PHYSICAL EXAMINATION:  VITAL SIGNS:  The patient's blood pressure is 196/74, temperature 98.1, pulse 103, respirations 18. HEENT:  Head is atraumatic, normocephalic.  Eyes:  Extraocular muscles are intact.  Oral mucosa is pink and moist. NECK:  Supple. CHEST:  Clear to auscultation bilaterally. HEART:  S1, S2.  Regular in rate and rhythm. ABDOMEN:  Soft, nontender.  No organomegaly. EXTREMITIES:  No cyanosis, no clubbing, no edema. NEUROLOGICAL:  Cranial nerves II-XII grossly intact.  Motor strength is 5/5 in both upper and lower extremities.  Sensations are intact.  PERTINENT LABS:  WBC of 6.7, hemoglobin 13.8, hematocrit 38.6, platelet count of 273,000.  Sodium is 140, potassium 3.3,  chloride 105, CO2 24, BUN 13, creatinine 0.89, glucose 109.  Full set of cardiac enzymes negative.  EKG shows nonspecific ST-T changes with sinus rhythm.  IMAGING STUDIES:  The chest x-ray shows no acute finding.  ASSESSMENT: 1. Chest pain, rule out acute coronary syndrome. 2. Hypertension. 3. Sinus tachycardia.  PLAN: 1. Chest pain.  The patient was admitted to the hospital and we will     cycle 3 sets of cardiac enzymes and also repeat EKG in the morning.     We will put the patient on telemetry.  The patient will be started     on aspirin.  At this time, the patient is chest pain free, so we     will not give any nitrates at this time. 2. Hypertension.  The patient came with blood pressure of 196/74 and I     am going to obtain an echocardiogram.  The patient also was having     tachycardia.  I will not start any medication at this time except     putting  her on hydralazine 25 mg p.o. q.6 h. p.r.n.  Also I am     going to start the patient on metoprolol 25 mg p.o. b.i.d. for the     hypertension. 3. Sinus tachycardia.  As above, the patient will be started on     metoprolol. 4. DVT prophylaxis with Lovenox.     Mauro Kaufmann, MD     GL/MEDQ  D:  11/18/2010  T:  11/18/2010  Job:  [id number]  Electronically Signed by Mauro Kaufmann  on 12/17/2010 07:49:30 PM

## 2010-12-17 NOTE — Discharge Summary (Signed)
Lisa Greene, Lisa Greene               ACCOUNT NO.:  [id number]  MEDICAL RECORD NO.:  [id number]  LOCATION:  3734                         FACILITY:  [facility]  PHYSICIAN:  Mauro Kaufmann, MD         DATE OF BIRTH:  03/17/1941  DATE OF ADMISSION:  11/18/2010 DATE OF DISCHARGE:  11/19/2010                              DISCHARGE SUMMARY   ADMISSION DIAGNOSES: 1. Chest, rule out acute coronary syndrome. 2. Hypertension. 3. Sinus tachycardia.  DISCHARGE DIAGNOSES: 1. Gastroesophageal reflux disease. 2. Atypical chest pain secondary to gastroesophageal reflux disease. 3. Hypertension. 4. Sinus tachycardia, resolved.  TESTS PERFORMED IN THE HOSPITAL STAY:  Chest x-ray on June 19 showed no findings.  Three sets of cardiac enzymes are negative.  EKG showed sinus tachycardia on the day of admission.  The patient's cholesterol showed LDL of 139 with HDL of 67, total cholesterol 231.  TSH 2.692.  The patient's BNP of 390.  Echocardiogram was done, which showed EF of 65%, wall motion normal with no regional wall motion abnormality, left ventricular diastolic functions were within normal limits.  BRIEF HISTORY AND PHYSICAL:  This is a 70 year old female who came to the hospital after an episode of chest discomfort and the patient ate popcorn last night and after when she laid down, she experienced heaviness and tightness in the chest which was located in the epigastric region.  The patient woke up and had a feeling of indigestion, heart racing, nausea which was unusual for her.  No history of CAD.  The patient was admitted with a diagnosis of chest out, rule out acute coronary syndrome and 3 sets of cardiac enzymes were ordered.  The patient also was found to have hypertension, blood pressure 196/74. Echo was ordered and also the patient was started on metoprolol for hypertension.  BRIEF HOSPITAL COURSE: 1. Chest pain.  The patient's chest pain is atypical in nature as it     resolved before  coming to the hospital and also the patient's 3     sets of cardiac enzymes negative and EKG is normal.  The patient     has a history of GERD.  She was started on Protonix and will be     continued on Protonix at this time. 2. Hypertension.  The patient's blood pressure was elevated and LVH     was seen on the echo, so the patient is to continue her     hypertension medication.  The patient was started on metoprolol 25     b.i.d. along with lisinopril 10 mg p.o. daily, but the patient had     an episode of bradycardia, so the dose of metoprolol has been cut     down to 12.5 b.i.d. or Toprol-XL 25 mg p.o. daily and I am also     going to start the patient on amlodipine 5 mg p.o. daily and stop     the lisinopril as the patient had a funny feeling of taking     lisinopril.  An EKG was performed, which showed sinus bradycardia     and no other abnormality after the patient had this episode on the  day of discharge.  At this time, the patient is asymptomatic.  She     denies any chest pain.  Denies any shortness of breath.  No     dizziness.  The patient is advised to take these medications and     check her blood pressure and pulse every day and report to primary     care physician in 1-2 weeks. 3. GERD.  The patient will be started on Protonix. 4. Sinus tachycardia is resolved.  In fact, the patient is having     sinus bradycardia at this time.  On the day of discharge, the patient's vitals are pulse is 71 as of 2:00 p.m. on June 20, temperature 98.2, respirations 16, blood pressure 131/72, O2 sat 96% on room air.  The patient is stable for discharge and she will follow up in weeks.  DISCHARGE MEDICATIONS: 1. Amlodipine 5 mg p.o. daily. 2. Protonix 40 mg p.o. daily. 3. Toprol-XL 25 mg p.o. daily. 4. Calcium carbonate over the counter 2 tablets p.o. daily 5. Flonase 1 spray daily as needed. 6. Vitamin D2 400 units 2 tablets by mouth daily.     Mauro Kaufmann, MD     GL/MEDQ   D:  11/19/2010  T:  11/20/2010  Job:  [id number]  cc:   Valetta Mole. Swords, MD  Electronically Signed by Mauro Kaufmann  on 12/17/2010 07:48:11 PM

## 2010-12-23 ENCOUNTER — Ambulatory Visit: Payer: PRIVATE HEALTH INSURANCE | Admitting: Internal Medicine

## 2011-01-15 ENCOUNTER — Ambulatory Visit (INDEPENDENT_AMBULATORY_CARE_PROVIDER_SITE_OTHER): Payer: PRIVATE HEALTH INSURANCE | Admitting: Internal Medicine

## 2011-01-15 ENCOUNTER — Encounter: Payer: Self-pay | Admitting: Internal Medicine

## 2011-01-15 DIAGNOSIS — I10 Essential (primary) hypertension: Secondary | ICD-10-CM

## 2011-01-15 DIAGNOSIS — E785 Hyperlipidemia, unspecified: Secondary | ICD-10-CM

## 2011-01-15 MED ORDER — METOPROLOL SUCCINATE ER 25 MG PO TB24: 25.0000 mg | ORAL_TABLET | Freq: Every day | ORAL | Status: DC

## 2011-01-15 MED ORDER — AMLODIPINE BESYLATE 5 MG PO TABS: 5.0000 mg | ORAL_TABLET | Freq: Every day | ORAL | Status: DC

## 2011-01-15 NOTE — Assessment & Plan Note (Signed)
Fair control BP Readings from Last 3 Encounters:  01/15/11 134/72  11/25/10 140/70  09/23/10 148/80

## 2011-01-15 NOTE — Progress Notes (Signed)
  Subjective:    Patient ID: Lisa Greene, female    DOB: 12/28/1940, 70 y.o.   MRN: [id number]  HPI  Patient Active Problem List  Diagnoses  . HYPERLIPIDEMIA  . EOSINOPHILIA  . ALLERGIC RHINITIS  . OSTEOPOROSIS  . DIVERTICULITIS, HX OF  . Hypertension   Past Medical History  Diagnosis Date  . ALLERGIC RHINITIS 01/31/2008  . DIVERTICULITIS, HX OF 09/07/2008  . Eosinophilia 02/08/2009  . HYPERLIPIDEMIA 12/20/2006  . OSTEOPOROSIS 12/20/2006   Past Surgical History  Procedure Date  . Thyroid duct cyst   . Knee arthroscopy     left  . Neuroma surgery     x2 feet    reports that she has never smoked. She does not have any smokeless tobacco history on file. She reports that she does not drink alcohol or use illicit drugs. family history includes COPD in her father and Heart disease in her father. Allergies  Allergen Reactions  . Penicillins     REACTION: rash  . Sulfonamide Derivatives     REACTION: rash     Review of Systems  patient denies chest pain, shortness of breath, orthopnea. Denies lower extremity edema, abdominal pain, change in appetite, change in bowel movements. Patient denies rashes, musculoskeletal complaints. No other specific complaints in a complete review of systems.      Objective:   Physical Exam  Well-developed well-nourished female in no acute distress. HEENT exam atraumatic, normocephalic, extraocular muscles are intact. Neck is supple. No jugular venous distention no thyromegaly. Chest clear to auscultation without increased work of breathing. Cardiac exam S1 and S2 are regular. Abdominal exam active bowel sounds, soft, nontender.      Assessment & Plan:

## 2011-01-15 NOTE — Assessment & Plan Note (Signed)
Reasonable control given HDL

## 2011-06-02 HISTORY — PX: CATARACT EXTRACTION BILATERAL W/ ANTERIOR VITRECTOMY: SHX1304

## 2011-07-20 ENCOUNTER — Ambulatory Visit: Payer: PRIVATE HEALTH INSURANCE | Admitting: Internal Medicine

## 2011-08-20 ENCOUNTER — Encounter: Payer: Self-pay | Admitting: Internal Medicine

## 2011-08-20 ENCOUNTER — Ambulatory Visit (INDEPENDENT_AMBULATORY_CARE_PROVIDER_SITE_OTHER): Payer: PRIVATE HEALTH INSURANCE | Admitting: Internal Medicine

## 2011-08-20 VITALS — BP 132/76 | HR 64 | Temp 98.2°F | Wt 145.0 lb

## 2011-08-20 DIAGNOSIS — I10 Essential (primary) hypertension: Secondary | ICD-10-CM

## 2011-08-20 DIAGNOSIS — E785 Hyperlipidemia, unspecified: Secondary | ICD-10-CM

## 2011-08-20 MED ORDER — MOMETASONE FUROATE 50 MCG/ACT NA SUSP: 2.0000 | Freq: Every day | NASAL | Status: DC

## 2011-08-20 NOTE — Assessment & Plan Note (Signed)
Lab Results  Component Value Date   CHOL 231* 11/19/2010   HDL 67 11/19/2010   LDLCALC 139* 11/19/2010   LDLDIRECT 156.5 01/29/2010   TRIG 127 11/19/2010   CHOLHDL 3.4 11/19/2010    hdl is good No need for treatment

## 2011-08-20 NOTE — Progress Notes (Signed)
Patient ID: Lisa Greene, female   DOB: 1940/08/11, 71 y.o.   MRN: [id number] htn--tolerating meds  Lipids---not treated because hdl is elevated  Past Medical History  Diagnosis Date  . ALLERGIC RHINITIS 01/31/2008  . DIVERTICULITIS, HX OF 09/07/2008  . Eosinophilia 02/08/2009  . HYPERLIPIDEMIA 12/20/2006  . OSTEOPOROSIS 12/20/2006    History   Social History  . Marital Status: Married    Spouse Name: N/A    Number of Children: N/A  . Years of Education: N/A   Occupational History  . Not on file.   Social History Main Topics  . Smoking status: Never Smoker   . Smokeless tobacco: Not on file  . Alcohol Use: No  . Drug Use: No  . Sexually Active: Not on file   Other Topics Concern  . Not on file   Social History Narrative  . No narrative on file    Past Surgical History  Procedure Date  . Thyroid duct cyst   . Knee arthroscopy     left  . Neuroma surgery     x2 feet    Family History  Problem Relation Age of Onset  . COPD Father   . Heart disease Father     stent placed    Allergies  Allergen Reactions  . Penicillins     REACTION: rash  . Sulfonamide Derivatives     REACTION: rash    Current Outpatient Prescriptions on File Prior to Visit  Medication Sig Dispense Refill  . amLODipine (NORVASC) 5 MG tablet Take 1 tablet (5 mg total) by mouth daily.  90 tablet  3  . Calcium Carbonate-Vitamin D (CALCIUM + D) 600-200 MG-UNIT TABS Take 600 Units by mouth daily.        . metoprolol succinate (TOPROL-XL) 25 MG 24 hr tablet Take 1 tablet (25 mg total) by mouth daily.  90 tablet  3     patient denies chest pain, shortness of breath, orthopnea. Denies lower extremity edema, abdominal pain, change in appetite, change in bowel movements. Patient denies rashes, musculoskeletal complaints. No other specific complaints in a complete review of systems.   BP 132/76  Pulse 64  Temp(Src) 98.2 F (36.8 C) (Oral)  Wt 145 lb (65.772 kg)  well-developed well-nourished  female in no acute distress. HEENT exam atraumatic, normocephalic, neck supple without jugular venous distention. Chest clear to auscultation cardiac exam S1-S2 are regular. Abdominal exam overweight with bowel sounds, soft and nontender. Extremities no edema. Neurologic exam is alert with a normal gait.

## 2011-08-20 NOTE — Assessment & Plan Note (Signed)
Well controlled Continue meds BP Readings from Last 3 Encounters:  08/20/11 132/76  01/15/11 134/72  11/25/10 140/70

## 2011-08-21 ENCOUNTER — Ambulatory Visit: Payer: PRIVATE HEALTH INSURANCE | Admitting: Internal Medicine

## 2011-10-29 ENCOUNTER — Encounter: Payer: Self-pay | Admitting: Internal Medicine

## 2011-11-05 ENCOUNTER — Encounter: Payer: Self-pay | Admitting: Internal Medicine

## 2011-12-02 ENCOUNTER — Telehealth: Payer: Self-pay | Admitting: Internal Medicine

## 2011-12-02 NOTE — Telephone Encounter (Signed)
Patient seems to be in control of Asthma symptoms w/Albuterol HFA.

## 2011-12-02 NOTE — Telephone Encounter (Signed)
Caller: Lisa Greene/Patient; PCP: Birdie Sons; CB#: [phone]; ; ; Call regarding Cough/Congestion; ONset yesterday morning 12/01/11, hacky cough. Afebrile, no rash, +runny nose clear.  +wheezing.  She has albuterol inhaler left over from last year but has not used it since then.  [place] and wants to know what she can do for "hacky cough'. Voice is clear and relaxed but noted congested. Do not hear any audible wheezing. No chest pain.  Reviewed Asthma-Adult protocol with patient.  Advised a trial of Albuterol inhaler, 2 puffs now and will follow back up with her in 30 minutes.  Also recommended, steam, humidifer and tea/honey for throat.  Patient expressed understanding. Will follow back up with patient. Called to speak with patient-  She states she now feels better, she does not have any chest tightness and her throat is no longer burning or coughing. She is staying at the beach Tuesday . Emergent s/sx ruled out per Asthma Adult Protocol with exception to "had wheezing episode that has now ended after following providers instructions".  Reviewed Asthma home care and Albuterol usage.  Will closely mointor her s/sx and call back with questions, changes or concerns. She will schedule appt with office upon returning from vacation.  Discussed use of Ace inhbitors sometimes increasing Asthma triggers advised to speak with physician regarding medication. Caller expressed understanding.

## 2011-12-09 ENCOUNTER — Encounter: Payer: Self-pay | Admitting: Internal Medicine

## 2011-12-09 ENCOUNTER — Ambulatory Visit (INDEPENDENT_AMBULATORY_CARE_PROVIDER_SITE_OTHER): Payer: PRIVATE HEALTH INSURANCE | Admitting: Internal Medicine

## 2011-12-09 VITALS — BP 130/70 | Temp 98.3°F | Wt 149.0 lb

## 2011-12-09 DIAGNOSIS — J40 Bronchitis, not specified as acute or chronic: Secondary | ICD-10-CM

## 2011-12-09 DIAGNOSIS — J309 Allergic rhinitis, unspecified: Secondary | ICD-10-CM

## 2011-12-09 DIAGNOSIS — I10 Essential (primary) hypertension: Secondary | ICD-10-CM

## 2011-12-09 NOTE — Patient Instructions (Signed)
Get plenty of rest, Drink lots of  clear liquids, and use Tylenol or ibuprofen for fever and discomfort.    Call or return to clinic prn if these symptoms worsen or fail to improve as anticipated.  

## 2011-12-09 NOTE — Progress Notes (Signed)
  Subjective:    Patient ID: Lisa Greene, female    DOB: [birth year], 71 y.o.   MRN: [id number]  HPI  71 year old patient who has a history of hypertension and allergic rhinitis. 8 days ago as he had the onset of URI symptoms with head and chest congestion and developed wheezing.  She had a similar fashion approximately one year ago and did have albuterol inhaler. This began at the beach and her symptoms have slowly improved. She returned to [facility] yesterday. At the present time she has mild resolving nonproductive cough but no fever or active wheezing. She has not required her albuterol in over 24 hours   Review of Systems  Constitutional: Negative.   HENT: Positive for congestion and rhinorrhea. Negative for hearing loss, sore throat, dental problem, sinus pressure and tinnitus.   Eyes: Negative for pain, discharge and visual disturbance.  Respiratory: Positive for cough, shortness of breath and wheezing.   Cardiovascular: Negative for chest pain, palpitations and leg swelling.  Gastrointestinal: Negative for nausea, vomiting, abdominal pain, diarrhea, constipation, blood in stool and abdominal distention.  Genitourinary: Negative for dysuria, urgency, frequency, hematuria, flank pain, vaginal bleeding, vaginal discharge, difficulty urinating, vaginal pain and pelvic pain.  Musculoskeletal: Negative for joint swelling, arthralgias and gait problem.  Skin: Negative for rash.  Neurological: Negative for dizziness, syncope, speech difficulty, weakness, numbness and headaches.  Hematological: Negative for adenopathy.  Psychiatric/Behavioral: Negative for behavioral problems, dysphoric mood and agitation. The patient is not nervous/anxious.        Objective:   Physical Exam  Constitutional: She is oriented to person, place, and time. She appears well-developed and well-nourished.  HENT:  Head: Normocephalic.  Right Ear: External ear normal.  Left Ear: External ear normal.    Mouth/Throat: Oropharynx is clear and moist.  Eyes: Conjunctivae and EOM are normal. Pupils are equal, round, and reactive to light.  Neck: Normal range of motion. Neck supple. No thyromegaly present.  Cardiovascular: Normal rate, regular rhythm, normal heart sounds and intact distal pulses.   Pulmonary/Chest: Effort normal and breath sounds normal. She has no wheezes.       O2 sat 98%  Abdominal: Soft. Bowel sounds are normal. She exhibits no mass. There is no tenderness.  Musculoskeletal: Normal range of motion.  Lymphadenopathy:    She has no cervical adenopathy.  Neurological: She is alert and oriented to person, place, and time.  Skin: Skin is warm and dry. No rash noted.  Psychiatric: She has a normal mood and affect. Her behavior is normal.          Assessment & Plan:   Viral URI with Asthmatic bronchitis-largely resolved HTN-stable AR-continue Nasonex

## 2012-01-10 ENCOUNTER — Other Ambulatory Visit: Payer: Self-pay | Admitting: Internal Medicine

## 2012-02-12 ENCOUNTER — Other Ambulatory Visit (INDEPENDENT_AMBULATORY_CARE_PROVIDER_SITE_OTHER): Payer: PRIVATE HEALTH INSURANCE

## 2012-02-12 DIAGNOSIS — Z79899 Other long term (current) drug therapy: Secondary | ICD-10-CM

## 2012-02-12 DIAGNOSIS — E785 Hyperlipidemia, unspecified: Secondary | ICD-10-CM

## 2012-02-12 DIAGNOSIS — Z0000 Encounter for general adult medical examination without abnormal findings: Secondary | ICD-10-CM

## 2012-02-12 LAB — CBC WITH DIFFERENTIAL/PLATELET
Basophils Relative: 1.1 % (ref 0.0–3.0)
Eosinophils Absolute: 0.6 10*3/uL (ref 0.0–0.7)
Eosinophils Relative: 11.3 % — ABNORMAL HIGH (ref 0.0–5.0)
Hemoglobin: 13.6 g/dL (ref 12.0–15.0)
Lymphocytes Relative: 26.3 % (ref 12.0–46.0)
MCHC: 33 g/dL (ref 30.0–36.0)
Neutro Abs: 2.8 10*3/uL (ref 1.4–7.7)
RBC: 4.48 Mil/uL (ref 3.87–5.11)
WBC: 5.3 10*3/uL (ref 4.5–10.5)

## 2012-02-12 LAB — POCT URINALYSIS DIPSTICK
Bilirubin, UA: NEGATIVE
Glucose, UA: NEGATIVE
Ketones, UA: NEGATIVE
Leukocytes, UA: NEGATIVE
Nitrite, UA: NEGATIVE

## 2012-02-12 LAB — HEPATIC FUNCTION PANEL
ALT: 20 U/L (ref 0–35)
AST: 19 U/L (ref 0–37)
Albumin: 4 g/dL (ref 3.5–5.2)
Alkaline Phosphatase: 55 U/L (ref 39–117)
Total Protein: 6.8 g/dL (ref 6.0–8.3)

## 2012-02-12 LAB — LIPID PANEL
Total CHOL/HDL Ratio: 4
Triglycerides: 124 mg/dL (ref 0.0–149.0)

## 2012-02-12 LAB — BASIC METABOLIC PANEL
BUN: 11 mg/dL (ref 6–23)
Creatinine, Ser: 1 mg/dL (ref 0.4–1.2)
GFR: 61.53 mL/min (ref >60.00)

## 2012-02-12 LAB — LDL CHOLESTEROL, DIRECT: Direct LDL: 148.5 mg/dL

## 2012-02-19 ENCOUNTER — Ambulatory Visit: Payer: PRIVATE HEALTH INSURANCE | Admitting: Internal Medicine

## 2012-02-22 ENCOUNTER — Encounter: Payer: Self-pay | Admitting: Internal Medicine

## 2012-02-22 ENCOUNTER — Ambulatory Visit (INDEPENDENT_AMBULATORY_CARE_PROVIDER_SITE_OTHER): Payer: PRIVATE HEALTH INSURANCE | Admitting: Internal Medicine

## 2012-02-22 VITALS — BP 146/70 | HR 72 | Temp 98.4°F | Wt 148.0 lb

## 2012-02-22 DIAGNOSIS — M899 Disorder of bone, unspecified: Secondary | ICD-10-CM

## 2012-02-22 DIAGNOSIS — I10 Essential (primary) hypertension: Secondary | ICD-10-CM

## 2012-02-22 DIAGNOSIS — M8580 Other specified disorders of bone density and structure, unspecified site: Secondary | ICD-10-CM

## 2012-02-22 MED ORDER — AMLODIPINE BESYLATE 5 MG PO TABS: 5.0000 mg | ORAL_TABLET | Freq: Every day | ORAL | Status: DC

## 2012-02-22 NOTE — Progress Notes (Signed)
Patient ID: Lisa Greene, female   DOB: 09-20-1940, 71 y.o.   MRN: [id number] BP-- home measurements: 120-135/70  She feels well, no complaints  Reviewed pmhx and meds  Exam:    patient denies chest pain, shortness of breath, orthopnea. Denies lower extremity edema, abdominal pain, change in appetite, change in bowel movements. Patient denies rashes, musculoskeletal complaints. No other specific complaints in a complete review of systems.   BP 146/70  Pulse 72  Temp 98.4 F (36.9 C) (Oral)  Wt 148 lb (67.132 kg)  Well-developed well-nourished female in no acute distress. HEENT exam atraumatic, normocephalic, extraocular muscles are intact. Neck is supple. No jugular venous distention no thyromegaly. Chest clear to auscultation without increased work of breathing. Cardiac exam S1 and S2 are regular. Abdominal exam active bowel sounds, soft, nontender. Extremities no edema. Neurologic exam she is alert without any motor sensory deficits. Gait is normal.

## 2012-02-22 NOTE — Assessment & Plan Note (Signed)
Adequate control, continue same meds

## 2012-02-24 ENCOUNTER — Ambulatory Visit (INDEPENDENT_AMBULATORY_CARE_PROVIDER_SITE_OTHER)
Admission: RE | Admit: 2012-02-24 | Discharge: 2012-02-24 | Disposition: A | Discharge status: Home / Self Care | Payer: PRIVATE HEALTH INSURANCE | Source: Ambulatory Visit

## 2012-02-24 DIAGNOSIS — M949 Disorder of cartilage, unspecified: Secondary | ICD-10-CM

## 2012-02-24 DIAGNOSIS — M8580 Other specified disorders of bone density and structure, unspecified site: Secondary | ICD-10-CM

## 2012-02-24 DIAGNOSIS — M899 Disorder of bone, unspecified: Secondary | ICD-10-CM

## 2012-06-23 ENCOUNTER — Ambulatory Visit (INDEPENDENT_AMBULATORY_CARE_PROVIDER_SITE_OTHER): Payer: PRIVATE HEALTH INSURANCE | Admitting: Family

## 2012-06-23 ENCOUNTER — Encounter: Payer: Self-pay | Admitting: Family

## 2012-06-23 VITALS — BP 138/70 | HR 96 | Temp 98.4°F | Wt 146.0 lb

## 2012-06-23 DIAGNOSIS — J209 Acute bronchitis, unspecified: Secondary | ICD-10-CM

## 2012-06-23 DIAGNOSIS — I10 Essential (primary) hypertension: Secondary | ICD-10-CM

## 2012-06-23 DIAGNOSIS — R059 Cough, unspecified: Secondary | ICD-10-CM

## 2012-06-23 DIAGNOSIS — R05 Cough: Secondary | ICD-10-CM

## 2012-06-23 MED ORDER — METHYLPREDNISOLONE 4 MG PO KIT: PACK | ORAL | Status: AC

## 2012-06-23 NOTE — Progress Notes (Signed)
Subjective:    Patient ID: Lisa Greene, female    DOB: [birth year], 72 y.o.   MRN: [id number]  HPI 72 year old white female, nonsmoker, patient of Dr. Cato Mulligan is in today with complaints of cough, congestion, wheezing x2 days. Cough is productive with clear phlegm. She has an albuterol inhaler that she has not used. Reports feeling tightness in her chest when she coughs. Denies any fever, muscle aches or pain, no chest pain.   Review of Systems  Constitutional: Negative.   HENT: Positive for sore throat and postnasal drip.   Respiratory: Positive for cough.   Cardiovascular: Negative.  Negative for chest pain.  Gastrointestinal: Negative.   Musculoskeletal: Negative.   Skin: Negative.   Hematological: Negative.   Psychiatric/Behavioral: Negative.    Past Medical History  Diagnosis Date  . ALLERGIC RHINITIS 01/31/2008  . DIVERTICULITIS, HX OF 09/07/2008  . Eosinophilia 02/08/2009  . HYPERLIPIDEMIA 12/20/2006  . OSTEOPOROSIS 12/20/2006    History   Social History  . Marital Status: Married    Spouse Name: N/A    Number of Children: N/A  . Years of Education: N/A   Occupational History  . Not on file.   Social History Main Topics  . Smoking status: Never Smoker   . Smokeless tobacco: Not on file  . Alcohol Use: No  . Drug Use: No  . Sexually Active: Not on file   Other Topics Concern  . Not on file   Social History Narrative  . No narrative on file    Past Surgical History  Procedure Date  . Thyroid duct cyst   . Knee arthroscopy     left  . Neuroma surgery     x2 feet  . Cataract extraction bilateral w/ anterior vitrectomy 2013    bilateral cataracts    Family History  Problem Relation Age of Onset  . COPD Father   . Heart disease Father     stent placed    Allergies  Allergen Reactions  . Penicillins     REACTION: rash  . Sulfonamide Derivatives     REACTION: rash    Current Outpatient Prescriptions on File Prior to Visit  Medication Sig  Dispense Refill  . albuterol (PROVENTIL HFA;VENTOLIN HFA) 108 (90 BASE) MCG/ACT inhaler Inhale 2 puffs into the lungs every 6 (six) hours as needed.      Marland Kitchen amLODipine (NORVASC) 5 MG tablet Take 1 tablet (5 mg total) by mouth daily.  90 tablet  2  . Calcium Carbonate-Vitamin D (CALCIUM + D) 600-200 MG-UNIT TABS Take 600 Units by mouth daily.        . metoprolol succinate (TOPROL-XL) 25 MG 24 hr tablet TAKE 1 TABLET BY MOUTH EVERY DAY  90 tablet  2  . mometasone (NASONEX) 50 MCG/ACT nasal spray Place 2 sprays into the nose daily.  17 g  12  . [DISCONTINUED] albuterol (PROVENTIL) 90 MCG/ACT inhaler Inhale 2 puffs into the lungs every 6 (six) hours as needed for wheezing.  17 g  0  . [DISCONTINUED] pantoprazole (PROTONIX) 40 MG tablet Take 1 tablet by mouth daily.        BP 138/70  Pulse 96  Temp 98.4 F (36.9 C) (Oral)  Wt 146 lb (66.225 kg)  SpO2 98%chart    Objective:   Physical Exam  Constitutional: She is oriented to person, place, and time. She appears well-developed and well-nourished.  HENT:  Right Ear: External ear normal.  Nose: Nose normal.  Mouth/Throat: Oropharynx is clear  and moist.       Pharynx moderately red  Neck: Normal range of motion. Neck supple.  Cardiovascular: Normal rate, regular rhythm and normal heart sounds.   Pulmonary/Chest: Effort normal and breath sounds normal.       Very mild expiratory wheezes in the base  Neurological: She is alert and oriented to person, place, and time.  Skin: Skin is warm and dry.  Psychiatric: She has a normal mood and affect.          Assessment & Plan:  Assessment:  1. Bronchitis 2. Cough 3. Hypertension  Plan: Medrol Dosepak as directed. Rest. Drink plenty of fluids. Patient call the office if symptoms worsen or persist. Recheck as scheduled and as needed.

## 2012-06-23 NOTE — Patient Instructions (Signed)

## 2012-06-27 ENCOUNTER — Ambulatory Visit (INDEPENDENT_AMBULATORY_CARE_PROVIDER_SITE_OTHER): Payer: Medicare Other | Admitting: Family Medicine

## 2012-06-27 ENCOUNTER — Encounter: Payer: Self-pay | Admitting: Family Medicine

## 2012-06-27 VITALS — BP 140/70 | Temp 98.8°F | Wt 146.0 lb

## 2012-06-27 DIAGNOSIS — R05 Cough: Secondary | ICD-10-CM

## 2012-06-27 DIAGNOSIS — R062 Wheezing: Secondary | ICD-10-CM

## 2012-06-27 DIAGNOSIS — R059 Cough, unspecified: Secondary | ICD-10-CM

## 2012-06-27 MED ORDER — METHYLPREDNISOLONE ACETATE 80 MG/ML IJ SUSP: 80.0000 mg | Freq: Once | INTRAMUSCULAR | Status: DC

## 2012-06-27 NOTE — Patient Instructions (Addendum)
Follow up promptly for any fever or worsening symptoms 

## 2012-06-27 NOTE — Progress Notes (Signed)
  Subjective:    Patient ID: Lisa Greene, female    DOB: April 05, 1941, 72 y.o.   MRN: [id number]  HPI  Cough with onset 5 days ago.  Mostly dry. No fever.  Mild nasal congestion. No sore throat.  No OTC meds. Nonsmoker.  Pt seen last Thursday and given prednisone for ? Wheezing Using albuterol. Feels she is still wheezing slightly especially at night.  Past Medical History  Diagnosis Date  . ALLERGIC RHINITIS 01/31/2008  . DIVERTICULITIS, HX OF 09/07/2008  . Eosinophilia 02/08/2009  . HYPERLIPIDEMIA 12/20/2006  . OSTEOPOROSIS 12/20/2006   Past Surgical History  Procedure Date  . Thyroid duct cyst   . Knee arthroscopy     left  . Neuroma surgery     x2 feet  . Cataract extraction bilateral w/ anterior vitrectomy 2013    bilateral cataracts    reports that she has never smoked. She does not have any smokeless tobacco history on file. She reports that she does not drink alcohol or use illicit drugs. family history includes COPD in her father and Heart disease in her father. Allergies  Allergen Reactions  . Penicillins     REACTION: rash  . Sulfonamide Derivatives     REACTION: rash      Review of Systems  Constitutional: Negative for fever and chills.  HENT: Positive for congestion. Negative for sore throat.   Respiratory: Positive for cough and wheezing.   Cardiovascular: Negative for chest pain.  Neurological: Negative for headaches.       Objective:   Physical Exam  Constitutional: She appears well-developed and well-nourished.  HENT:  Right Ear: External ear normal.  Left Ear: External ear normal.  Mouth/Throat: Oropharynx is clear and moist.  Neck: Neck supple. No thyromegaly present.  Cardiovascular: Normal rate and regular rhythm.   Pulmonary/Chest: Effort normal.       Patient some faint expiratory wheezes. No rales. No retractions          Assessment & Plan:  Cough associated with mild wheezing. Suspect viral trigger. She has no fever and nonfocal  exam otherwise. Depo-Medrol 80 mg IM given. Continue albuterol as needed.

## 2012-08-21 NOTE — Assessment & Plan Note (Signed)
Lipid Panel     Component Value Date/Time   CHOL 231* 02/12/2012 0822   TRIG 124.0 02/12/2012 0822   HDL 59.30 02/12/2012 0822   CHOLHDL 4 02/12/2012 0822   VLDL 24.8 02/12/2012 0822   LDLCALC 139* 11/19/2010 0500    Will follow. Note HDL

## 2012-08-21 NOTE — Progress Notes (Signed)
htn- tolerating meds without difficulty. Home bps120-128/60s  Hx of bronchospasm- rare use of SABA  Past Medical History  Diagnosis Date  . ALLERGIC RHINITIS 01/31/2008  . DIVERTICULITIS, HX OF 09/07/2008  . Eosinophilia 02/08/2009  . HYPERLIPIDEMIA 12/20/2006  . OSTEOPOROSIS 12/20/2006    History   Social History  . Marital Status: Married    Spouse Name: N/A    Number of Children: N/A  . Years of Education: N/A   Occupational History  . Not on file.   Social History Main Topics  . Smoking status: Never Smoker   . Smokeless tobacco: Not on file  . Alcohol Use: No  . Drug Use: No  . Sexually Active: Not on file   Other Topics Concern  . Not on file   Social History Narrative  . No narrative on file    Past Surgical History  Procedure Laterality Date  . Thyroid duct cyst    . Knee arthroscopy      left  . Neuroma surgery      x2 feet  . Cataract extraction bilateral w/ anterior vitrectomy  2013    bilateral cataracts    Family History  Problem Relation Age of Onset  . COPD Father   . Heart disease Father     stent placed    Allergies  Allergen Reactions  . Penicillins     REACTION: rash  . Sulfonamide Derivatives     REACTION: rash    Current Outpatient Prescriptions on File Prior to Visit  Medication Sig Dispense Refill  . albuterol (PROVENTIL HFA;VENTOLIN HFA) 108 (90 BASE) MCG/ACT inhaler Inhale 2 puffs into the lungs every 6 (six) hours as needed.      Marland Kitchen amLODipine (NORVASC) 5 MG tablet Take 1 tablet (5 mg total) by mouth daily.  90 tablet  2  . Calcium Carbonate-Vitamin D (CALCIUM + D) 600-200 MG-UNIT TABS Take 600 Units by mouth daily.        . metoprolol succinate (TOPROL-XL) 25 MG 24 hr tablet TAKE 1 TABLET BY MOUTH EVERY DAY  90 tablet  2  . [DISCONTINUED] albuterol (PROVENTIL) 90 MCG/ACT inhaler Inhale 2 puffs into the lungs every 6 (six) hours as needed for wheezing.  17 g  0  . [DISCONTINUED] pantoprazole (PROTONIX) 40 MG tablet Take 1 tablet  by mouth daily.       No current facility-administered medications on file prior to visit.     patient denies chest pain, shortness of breath, orthopnea. Denies lower extremity edema, abdominal pain, change in appetite, change in bowel movements. Patient denies rashes, musculoskeletal complaints. No other specific complaints in a complete review of systems.   BP 150/74  Pulse 64  Temp(Src) 98 F (36.7 C) (Oral)  Wt 147 lb (66.679 kg)  BMI 26.88 kg/m2   Well-developed well-nourished female in no acute distress. HEENT exam atraumatic, normocephalic, extraocular muscles are intact. Neck is supple. No jugular venous distention no thyromegaly. Chest clear to auscultation without increased work of breathing. Cardiac exam S1 and S2 are regular. Abdominal exam active bowel sounds, soft, nontender. Extremities no edema. Neurologic exam she is alert without any motor sensory deficits. Gait is normal.

## 2012-08-21 NOTE — Assessment & Plan Note (Addendum)
Good control at home Continue current meds

## 2012-08-22 ENCOUNTER — Ambulatory Visit (INDEPENDENT_AMBULATORY_CARE_PROVIDER_SITE_OTHER): Payer: Medicare Other | Admitting: Internal Medicine

## 2012-08-22 ENCOUNTER — Encounter: Payer: Self-pay | Admitting: Internal Medicine

## 2012-08-22 VITALS — BP 150/74 | HR 64 | Temp 98.0°F | Wt 147.0 lb

## 2012-08-22 DIAGNOSIS — Z23 Encounter for immunization: Secondary | ICD-10-CM

## 2012-08-22 DIAGNOSIS — I10 Essential (primary) hypertension: Secondary | ICD-10-CM

## 2012-08-22 DIAGNOSIS — E785 Hyperlipidemia, unspecified: Secondary | ICD-10-CM

## 2012-08-29 ENCOUNTER — Other Ambulatory Visit: Payer: Self-pay | Admitting: Internal Medicine

## 2012-09-20 ENCOUNTER — Telehealth: Payer: Self-pay | Admitting: Internal Medicine

## 2012-09-20 NOTE — Telephone Encounter (Signed)
Scheduler for [facility] called and stated that they needed a doctors order for a diagnostic mammogram. Please fax it to [phone]. Please assist.

## 2012-09-22 NOTE — Telephone Encounter (Signed)
Pt had an abnormal mammogram last year of the right breast.  Per radiologist report she needs a f/u diagnostic mammogram.  Order was faxed

## 2012-09-22 NOTE — Telephone Encounter (Signed)
Left for Malachi Bonds to call back

## 2012-10-10 ENCOUNTER — Other Ambulatory Visit: Payer: Self-pay | Admitting: Internal Medicine

## 2012-11-07 ENCOUNTER — Encounter: Payer: Self-pay | Admitting: Internal Medicine

## 2013-01-07 ENCOUNTER — Other Ambulatory Visit: Payer: Self-pay | Admitting: Internal Medicine

## 2013-02-20 ENCOUNTER — Ambulatory Visit (INDEPENDENT_AMBULATORY_CARE_PROVIDER_SITE_OTHER): Payer: Medicare Other | Admitting: Internal Medicine

## 2013-02-20 ENCOUNTER — Encounter: Payer: Self-pay | Admitting: Internal Medicine

## 2013-02-20 VITALS — BP 142/66 | HR 76 | Temp 98.3°F | Wt 147.0 lb

## 2013-02-20 DIAGNOSIS — D721 Eosinophilia, unspecified: Secondary | ICD-10-CM

## 2013-02-20 DIAGNOSIS — Z23 Encounter for immunization: Secondary | ICD-10-CM

## 2013-02-20 DIAGNOSIS — M810 Age-related osteoporosis without current pathological fracture: Secondary | ICD-10-CM

## 2013-02-20 DIAGNOSIS — E785 Hyperlipidemia, unspecified: Secondary | ICD-10-CM

## 2013-02-20 DIAGNOSIS — I10 Essential (primary) hypertension: Secondary | ICD-10-CM

## 2013-02-20 LAB — CBC WITH DIFFERENTIAL/PLATELET
Basophils Absolute: 0.1 10*3/uL (ref 0.0–0.1)
Eosinophils Absolute: 0.8 10*3/uL — ABNORMAL HIGH (ref 0.0–0.7)
Eosinophils Relative: 12.3 % — ABNORMAL HIGH (ref 0.0–5.0)
Hemoglobin: 14.1 g/dL (ref 12.0–15.0)
Lymphocytes Relative: 26.9 % (ref 12.0–46.0)
MCHC: 34.2 g/dL (ref 30.0–36.0)
Monocytes Relative: 6.9 % (ref 3.0–12.0)
Neutro Abs: 3.4 10*3/uL (ref 1.4–7.7)
Platelets: 298 10*3/uL (ref 150.0–400.0)
RDW: 13.5 % (ref 11.5–14.6)

## 2013-02-20 LAB — BASIC METABOLIC PANEL
BUN: 12 mg/dL (ref 6–23)
Calcium: 9.1 mg/dL (ref 8.4–10.5)
Creatinine, Ser: 1 mg/dL (ref 0.4–1.2)
GFR: 61.35 mL/min (ref >60.00)
Glucose, Bld: 95 mg/dL (ref 70–99)
Potassium: 4.6 mEq/L (ref 3.5–5.1)

## 2013-02-20 LAB — TSH: TSH: 3.15 u[IU]/mL (ref 0.35–5.50)

## 2013-02-20 LAB — LIPID PANEL
Cholesterol: 248 mg/dL — ABNORMAL HIGH (ref 0–200)
HDL: 57 mg/dL (ref >39.00)
Total CHOL/HDL Ratio: 4
Triglycerides: 188 mg/dL — ABNORMAL HIGH (ref 0.0–149.0)
VLDL: 37.6 mg/dL (ref 0.0–40.0)

## 2013-02-20 LAB — HEPATIC FUNCTION PANEL
Albumin: 4.1 g/dL (ref 3.5–5.2)
Total Bilirubin: 0.7 mg/dL (ref 0.3–1.2)

## 2013-02-20 NOTE — Assessment & Plan Note (Signed)
Continue weight bearing exercise

## 2013-02-20 NOTE — Assessment & Plan Note (Signed)
Chronic- Will continue to follow

## 2013-02-20 NOTE — Progress Notes (Signed)
htn- tolerating meds- minimal lower extremity edema (on amlodipine).  Home bps: 120-140/60s  Lipids- not treated  Osteoporosis- last BMD 2013- "osteopenia"  Reviewed pmh, meds, sochx, surg hx  Data- reviewed last dexa  Ros- no c/o- she remains very active  Exam: reviewed vitals  Well-developed well-nourished female in no acute distress. HEENT exam atraumatic, normocephalic, extraocular muscles are intact. Neck is supple. No jugular venous distention no thyromegaly. Chest clear to auscultation without increased work of breathing. Cardiac exam S1 and S2 are regular. Abdominal exam active bowel sounds, soft, nontender. Extremities no edema. Neurologic exam she is alert without any motor sensory deficits. Gait is normal.

## 2013-02-20 NOTE — Addendum Note (Signed)
Addended by: Alfred Levins D on: 02/20/2013 01:51 PM   Modules accepted: Orders

## 2013-02-20 NOTE — Assessment & Plan Note (Signed)
Check labs today.

## 2013-02-20 NOTE — Assessment & Plan Note (Signed)
Adequate control Continue same meds Monitor minimal LE edema

## 2013-03-06 ENCOUNTER — Ambulatory Visit (INDEPENDENT_AMBULATORY_CARE_PROVIDER_SITE_OTHER): Payer: Medicare Other | Admitting: Family Medicine

## 2013-03-06 ENCOUNTER — Encounter: Payer: Self-pay | Admitting: Family Medicine

## 2013-03-06 VITALS — BP 140/66 | HR 72 | Temp 98.2°F | Wt 150.0 lb

## 2013-03-06 DIAGNOSIS — R21 Rash and other nonspecific skin eruption: Secondary | ICD-10-CM

## 2013-03-06 MED ORDER — ALBUTEROL SULFATE HFA 108 (90 BASE) MCG/ACT IN AERS: 2.0000 | INHALATION_SPRAY | Freq: Four times a day (QID) | RESPIRATORY_TRACT | Status: DC | PRN

## 2013-03-06 NOTE — Progress Notes (Signed)
  Subjective:    Patient ID: Lisa Greene, female    DOB: 1941/05/15, 72 y.o.   MRN: [id number]  HPI  Acute visit for skin rash. Patient has pruritic rash involving her legs back shoulder waist.. Onset about one week ago. No clear precipitants. No recent change in soaps or detergents. No history of any clear food allergies. No alleviating factors. Possibly exacerbated by heat.  No recent infectious symptoms. No fevers or chills. She takes amlodipine and metoprolol for hypertension but has been on these for several years. She declines flu vaccine  Past Medical History  Diagnosis Date  . ALLERGIC RHINITIS 01/31/2008  . DIVERTICULITIS, HX OF 09/07/2008  . Eosinophilia 02/08/2009  . HYPERLIPIDEMIA 12/20/2006  . OSTEOPOROSIS 12/20/2006   Past Surgical History  Procedure Laterality Date  . Thyroid duct cyst    . Knee arthroscopy      left  . Neuroma surgery      x2 feet  . Cataract extraction bilateral w/ anterior vitrectomy  2013    bilateral cataracts    reports that she has never smoked. She does not have any smokeless tobacco history on file. She reports that she does not drink alcohol or use illicit drugs. family history includes COPD in her father; Heart disease in her father. Allergies  Allergen Reactions  . Penicillins     REACTION: rash  . Sulfonamide Derivatives     REACTION: rash     Review of Systems  Constitutional: Negative for fever and chills.  Skin: Positive for rash.       Objective:   Physical Exam  Constitutional: She appears well-developed and well-nourished.  Cardiovascular: Normal rate and regular rhythm.   Pulmonary/Chest: Effort normal and breath sounds normal. No respiratory distress. She has no wheezes. She has no rales.  Skin: Rash noted.  Patient has nonspecific minimally raised scattered rash which is erythematous and blanches with pressure involving her legs, back, waist, upper arms. Nonscaly. No vesicles          Assessment & Plan:   Nonspecific, allergic appearing rash with widespread distribution as above. She'll try over-the-counter Zyrtec or Allegra. Consider Aveeno soap. Avoid scratching. Avoid getting overheated.

## 2013-03-06 NOTE — Patient Instructions (Addendum)
Try daily use of Zyrtec or Allegra for skin rash Avoid getting over heated Consider non itch soap such as Aveeno.

## 2013-04-10 ENCOUNTER — Other Ambulatory Visit: Payer: Self-pay | Admitting: Internal Medicine

## 2013-05-13 ENCOUNTER — Encounter: Payer: Self-pay | Admitting: Internal Medicine

## 2013-07-10 ENCOUNTER — Telehealth: Payer: Self-pay | Admitting: Internal Medicine

## 2013-07-10 MED ORDER — AMLODIPINE BESYLATE 5 MG PO TABS: ORAL_TABLET | ORAL | Status: DC

## 2013-07-10 NOTE — Telephone Encounter (Signed)
rx sent in electronically 

## 2013-07-10 NOTE — Telephone Encounter (Signed)
Pt in requesting new rx amLODipine (NORVASC) 5 MG tablet, and metroprolol succinate (torprol-xl) 25 mg, 90 day supply- sent to [address]

## 2013-07-11 MED ORDER — METOPROLOL SUCCINATE ER 25 MG PO TB24: ORAL_TABLET | ORAL | Status: DC

## 2013-07-11 NOTE — Addendum Note (Signed)
Addended by: Townsend Roger D on: 07/11/2013 01:29 PM   Modules accepted: Orders

## 2013-07-11 NOTE — Telephone Encounter (Signed)
rx sent in electronically 

## 2013-07-11 NOTE — Telephone Encounter (Signed)
Pt states metoprolol succinate (TOPROL-XL) 25 MG 24 hr tablet wasn't filled yesterday.  90 day supply

## 2013-08-07 ENCOUNTER — Ambulatory Visit (INDEPENDENT_AMBULATORY_CARE_PROVIDER_SITE_OTHER): Payer: Medicare HMO | Admitting: Family Medicine

## 2013-08-07 ENCOUNTER — Encounter: Payer: Self-pay | Admitting: Family Medicine

## 2013-08-07 VITALS — BP 150/70 | Temp 98.3°F | Wt 151.0 lb

## 2013-08-07 DIAGNOSIS — I10 Essential (primary) hypertension: Secondary | ICD-10-CM

## 2013-08-07 DIAGNOSIS — N9489 Other specified conditions associated with female genital organs and menstrual cycle: Secondary | ICD-10-CM

## 2013-08-07 DIAGNOSIS — N949 Unspecified condition associated with female genital organs and menstrual cycle: Secondary | ICD-10-CM

## 2013-08-07 DIAGNOSIS — M549 Dorsalgia, unspecified: Secondary | ICD-10-CM

## 2013-08-07 LAB — POCT URINALYSIS DIPSTICK
BILIRUBIN UA: NEGATIVE
Blood, UA: NEGATIVE
Glucose, UA: NEGATIVE
KETONES UA: NEGATIVE
LEUKOCYTES UA: NEGATIVE
NITRITE UA: NEGATIVE
PH UA: 6
Spec Grav, UA: 1.025
Urobilinogen, UA: 0.2

## 2013-08-07 NOTE — Patient Instructions (Addendum)
-  heat for 15 minutes twice daily  -exercises provided 3-4 days per week  -gentle activities  -tylenol 500-1000mg  up to 3 times per day on if needed  -follow up in 1 month with your doctor

## 2013-08-07 NOTE — Progress Notes (Signed)
Pre visit review using our clinic review tool, if applicable. No additional management support is needed unless otherwise documented below in the visit note. 

## 2013-08-07 NOTE — Progress Notes (Signed)
Chief Complaint  Patient presents with  . burning sensation in lower back and pelvic area    HPI:  Acute visit back pain: -started: 2 weeks ago after starting back on the treadmill - 40 minutes -symptoms: bilateral lumbar soreness in back and radiates to groin on L at times, mild to mod, intermittent, standing/walking a lot makes it worse, relieved with tylenol -denies: fevers, malaise, weight loss, diarrhea, constipation, urinary symptoms, weakness or numbness   ROS: See pertinent positives and negatives per HPI.  Past Medical History  Diagnosis Date  . ALLERGIC RHINITIS 01/31/2008  . DIVERTICULITIS, HX OF 09/07/2008  . Eosinophilia 02/08/2009  . HYPERLIPIDEMIA 12/20/2006  . OSTEOPOROSIS 12/20/2006    Past Surgical History  Procedure Laterality Date  . Thyroid duct cyst    . Knee arthroscopy      left  . Neuroma surgery      x2 feet  . Cataract extraction bilateral w/ anterior vitrectomy  2013    bilateral cataracts    Family History  Problem Relation Age of Onset  . COPD Father   . Heart disease Father     stent placed    History   Social History  . Marital Status: Married    Spouse Name: N/A    Number of Children: N/A  . Years of Education: N/A   Social History Main Topics  . Smoking status: Never Smoker   . Smokeless tobacco: None  . Alcohol Use: No  . Drug Use: No  . Sexual Activity: None   Other Topics Concern  . None   Social History Narrative  . None    Current outpatient prescriptions:albuterol (PROVENTIL HFA;VENTOLIN HFA) 108 (90 BASE) MCG/ACT inhaler, Inhale 2 puffs into the lungs every 6 (six) hours as needed., Disp: 1 Inhaler, Rfl: 2;  amLODipine (NORVASC) 5 MG tablet, TAKE 1 TABLET BY MOUTH EVERY DAY, Disp: 90 tablet, Rfl: 1;  metoprolol succinate (TOPROL-XL) 25 MG 24 hr tablet, TAKE 1 TABLET BY MOUTH EVERY DAY, Disp: 90 tablet, Rfl: 1 NASONEX 50 MCG/ACT nasal spray, INSTILL 2 SPRAYS IN THE NOSE EVERY DAY, Disp: 17 g, Rfl: 1;  [DISCONTINUED]  albuterol (PROVENTIL) 90 MCG/ACT inhaler, Inhale 2 puffs into the lungs every 6 (six) hours as needed for wheezing., Disp: 17 g, Rfl: 0;  [DISCONTINUED] pantoprazole (PROTONIX) 40 MG tablet, Take 1 tablet by mouth daily., Disp: , Rfl:   EXAM:  Filed Vitals:   08/07/13 0907  BP: 150/70  Temp: 98.3 F (36.8 C)    Body mass index is 27.61 kg/(m^2).  GENERAL: vitals reviewed and listed above, alert, oriented, appears well hydrated and in no acute distress  HEENT: atraumatic, conjunttiva clear, no obvious abnormalities on inspection of external nose and ears  NECK: no obvious masses on inspection  LUNGS: clear to auscultation bilaterally, no wheezes, rales or rhonchi, good air movement  CV: HRRR, no peripheral edema  MS: moves all extremities without noticeable abnormality  PSYCH: pleasant and cooperative, no obvious depression or anxiety  ASSESSMENT AND PLAN:  Discussed the following assessment and plan:  Burning sensation of female pelvis - Plan: POCT urinalysis dipstick  Hypertension  Back pain -we discussed possible serious and likely etiologies, workup and treatment, treatment risks and return precautions -seems musculoskeletal per exam with posisbility of sacroiliitis mild or mild L hip OA though seems more in muscles and ligaments from -udip neg -after this discussion, Riki opted for conservative approach per instructions and tylenol prn -follow up advised in 1 month -of  course, we advised Avice  to return or notify a doctor immediately if symptoms worsen or persist or new concerns arise.  Patient Instructions  -heat for 15 minutes twice daily  -exercises provided 3-4 days per week  -gentle activities  -tylenol 500-1000mg  up to 3 times per day on if needed  -follow up in 1 month with your doctor     Lucretia Kern.

## 2013-08-08 ENCOUNTER — Telehealth: Payer: Self-pay | Admitting: Internal Medicine

## 2013-08-08 NOTE — Telephone Encounter (Signed)
Relevant patient education assigned to patient using Emmi. ° °

## 2013-09-29 LAB — HM MAMMOGRAPHY: HM MAMMO: NORMAL

## 2013-10-04 ENCOUNTER — Ambulatory Visit (INDEPENDENT_AMBULATORY_CARE_PROVIDER_SITE_OTHER): Payer: Medicare HMO | Admitting: Podiatry

## 2013-10-04 ENCOUNTER — Encounter: Payer: Self-pay | Admitting: Podiatry

## 2013-10-04 ENCOUNTER — Ambulatory Visit (INDEPENDENT_AMBULATORY_CARE_PROVIDER_SITE_OTHER): Payer: Medicare HMO

## 2013-10-04 VITALS — BP 151/74 | HR 79 | Resp 15 | Ht 62.0 in | Wt 150.0 lb

## 2013-10-04 DIAGNOSIS — M79609 Pain in unspecified limb: Secondary | ICD-10-CM

## 2013-10-04 DIAGNOSIS — M21619 Bunion of unspecified foot: Secondary | ICD-10-CM

## 2013-10-04 NOTE — Progress Notes (Signed)
   Subjective:    Patient ID: Lisa Greene, female    DOB: 1940/11/20, 73 y.o.   MRN: [id number]  HPI Comments: N foot pain L right 5th MPJ plantar area D 3 months O  C enlarged MPJ, redness, burning pain A walking for exercise, and enclosed shoe feels like a knot T none treatment  Foot Pain   This patient walks 2 miles 3 times a week for the past 3 years. She is still able to continue walking in spite of her foot pain.    Review of Systems  All other systems reviewed and are negative.      Objective:   Physical Exam  Orientated x3 space white female  Vascular: DP and PT pulses 2/4 bilaterally  Neurological: Sensation to 10 g monofilament wire intact 5/5 bilaterally Vibratory sensation intact bilaterally Ankle reflexes reactive bilaterally  Dermatological: There is a palpable fibrosis on the plantar fifth right MPJ.  Musculoskeletal: HAV and Taylor's bunions bilaterally The lateral fifth right MPJ is tender to lateral and plantar pressure which is the symptomatic area the  X-ray examination right foot  Intact bony structure without fracture and/or dislocation noted.  HAV and Taylor's bunion noted  Generalized decrease bone density noted in all views  Inferior calcaneal spur noted  Radiographic impression:  No acute bony abnormality noted in the right foot        Assessment & Plan:   Assessment: Symptomatic right Taylor's bunion with associated surrounding capsulitis and reactive fibrosis plantarly.  Plan: Placed a surgical felt pad and the patient's athletic shoe insole to offload the fifth right MPJ Skin is prepped with alcohol and Betadine and 4 mg of dexamethasone phosphate mixed with 5 mg of plain Marcaine were injected around the medial and plantar fifth right MPJ area.  Reappoint at patient's request

## 2013-10-05 ENCOUNTER — Encounter: Payer: Self-pay | Admitting: Podiatry

## 2013-10-05 DIAGNOSIS — M21619 Bunion of unspecified foot: Secondary | ICD-10-CM

## 2013-10-05 MED ORDER — DEXAMETHASONE SODIUM PHOSPHATE 120 MG/30ML IJ SOLN
4.0000 mg | Freq: Once | INTRAMUSCULAR | Status: AC
  Administered 2013-10-05: 4 mg via INTRA_ARTICULAR

## 2013-11-01 ENCOUNTER — Encounter: Payer: Self-pay | Admitting: Internal Medicine

## 2013-11-07 ENCOUNTER — Ambulatory Visit (INDEPENDENT_AMBULATORY_CARE_PROVIDER_SITE_OTHER): Payer: Medicare HMO | Admitting: Family Medicine

## 2013-11-07 ENCOUNTER — Encounter: Payer: Self-pay | Admitting: Family Medicine

## 2013-11-07 VITALS — BP 136/74 | HR 85 | Temp 98.6°F | Wt 150.0 lb

## 2013-11-07 DIAGNOSIS — J209 Acute bronchitis, unspecified: Secondary | ICD-10-CM

## 2013-11-07 MED ORDER — MOMETASONE FUROATE 50 MCG/ACT NA SUSP: NASAL | Status: DC

## 2013-11-07 NOTE — Progress Notes (Signed)
   Subjective:    Patient ID: Lisa Greene, female    DOB: [birth year], 73 y.o.   MRN: [id number]  Cough Associated symptoms include chills and headaches. Pertinent negatives include no fever or sore throat.   Acute visit for cough. Minimal nasal congestion. Onset last Thursday. She's had some mild headache off and on. Occasional chills but no fever. She was exposed to granddaughter who had similar symptoms couple of weeks ago. Cough is been relatively mild. She has not taken any medications for her cough. Patient is nonsmoker. Denies sore throat.  Past Medical History  Diagnosis Date  . ALLERGIC RHINITIS 01/31/2008  . DIVERTICULITIS, HX OF 09/07/2008  . Eosinophilia 02/08/2009  . HYPERLIPIDEMIA 12/20/2006  . OSTEOPOROSIS 12/20/2006   Past Surgical History  Procedure Laterality Date  . Thyroid duct cyst    . Knee arthroscopy      left  . Neuroma surgery      x2 feet  . Cataract extraction bilateral w/ anterior vitrectomy  2013    bilateral cataracts    reports that she has never smoked. She does not have any smokeless tobacco history on file. She reports that she does not drink alcohol or use illicit drugs. family history includes COPD in her father; Heart disease in her father. Allergies  Allergen Reactions  . Penicillins     REACTION: rash  . Sulfonamide Derivatives     REACTION: rash      Review of Systems  Constitutional: Positive for chills and fatigue. Negative for fever.  HENT: Positive for congestion. Negative for sore throat.   Respiratory: Positive for cough.   Neurological: Positive for headaches.       Objective:   Physical Exam  Constitutional: She appears well-developed and well-nourished.  HENT:  Right Ear: External ear normal.  Left Ear: External ear normal.  Mouth/Throat: Oropharynx is clear and moist.  Neck: Neck supple. No thyromegaly present.  Cardiovascular: Normal rate and regular rhythm.   Pulmonary/Chest: Effort normal and breath sounds  normal. No respiratory distress. She has no wheezes. She has no rales.  Lymphadenopathy:    She has no cervical adenopathy.          Assessment & Plan:  Acute bronchitis. Suspect viral. Nonfocal exam. Treat symptomatically. She'll try over-the-counter Mucinex. Followup for fever or worsening symptoms

## 2013-11-07 NOTE — Patient Instructions (Signed)
Acute Bronchitis Bronchitis is inflammation of the airways that extend from the windpipe into the lungs (bronchi). The inflammation often causes mucus to develop. This leads to a cough, which is the most common symptom of bronchitis.  In acute bronchitis, the condition usually develops suddenly and goes away over time, usually in a couple weeks. Smoking, allergies, and asthma can make bronchitis worse. Repeated episodes of bronchitis may cause further lung problems.  CAUSES Acute bronchitis is most often caused by the same virus that causes a cold. The virus can spread from person to person (contagious).  SIGNS AND SYMPTOMS   Cough.   Fever.   Coughing up mucus.   Body aches.   Chest congestion.   Chills.   Shortness of breath.   Sore throat.  DIAGNOSIS  Acute bronchitis is usually diagnosed through a physical exam. Tests, such as chest X-rays, are sometimes done to rule out other conditions.  TREATMENT  Acute bronchitis usually goes away in a couple weeks. Often times, no medical treatment is necessary. Medicines are sometimes given for relief of fever or cough. Antibiotics are usually not needed but may be prescribed in certain situations. In some cases, an inhaler may be recommended to help reduce shortness of breath and control the cough. A cool mist vaporizer may also be used to help thin bronchial secretions and make it easier to clear the chest.  HOME CARE INSTRUCTIONS  Get plenty of rest.   Drink enough fluids to keep your urine clear or pale yellow (unless you have a medical condition that requires fluid restriction). Increasing fluids may help thin your secretions and will prevent dehydration.   Only take over-the-counter or prescription medicines as directed by your health care provider.   Avoid smoking and secondhand smoke. Exposure to cigarette smoke or irritating chemicals will make bronchitis worse. If you are a smoker, consider using nicotine gum or skin  patches to help control withdrawal symptoms. Quitting smoking will help your lungs heal faster.   Reduce the chances of another bout of acute bronchitis by washing your hands frequently, avoiding people with cold symptoms, and trying not to touch your hands to your mouth, nose, or eyes.   Follow up with your health care provider as directed.  SEEK MEDICAL CARE IF: Your symptoms do not improve after 1 week of treatment.  SEEK IMMEDIATE MEDICAL CARE IF:  You develop an increased fever or chills.   You have chest pain.   You have severe shortness of breath.  You have bloody sputum.   You develop dehydration.  You develop fainting.  You develop repeated vomiting.  You develop a severe headache. MAKE SURE YOU:   Understand these instructions.  Will watch your condition.  Will get help right away if you are not doing well or get worse. Document Released: 06/25/2004 Document Revised: 01/18/2013 Document Reviewed: 11/08/2012 Exit[employer] Patient Information 2014 [employer].  

## 2013-11-08 ENCOUNTER — Encounter: Payer: Self-pay | Admitting: Internal Medicine

## 2013-11-13 ENCOUNTER — Encounter: Payer: Self-pay | Admitting: Family Medicine

## 2013-11-13 ENCOUNTER — Ambulatory Visit (INDEPENDENT_AMBULATORY_CARE_PROVIDER_SITE_OTHER): Payer: Medicare HMO | Admitting: Family Medicine

## 2013-11-13 VITALS — BP 132/70 | HR 81 | Temp 98.6°F | Wt 150.0 lb

## 2013-11-13 DIAGNOSIS — H6121 Impacted cerumen, right ear: Secondary | ICD-10-CM

## 2013-11-13 DIAGNOSIS — H6120 Impacted cerumen, unspecified ear: Secondary | ICD-10-CM

## 2013-11-13 NOTE — Progress Notes (Signed)
   Subjective:    Patient ID: Lisa Greene, female    DOB: 03/16/1941, 73 y.o.   MRN: [id number]  HPI  Patient recently seen for acute bronchitis. Her cough is slightly improved. She now presents today with some right ear fullness and pain.  No fever. Denies any sore throat. Pain is relatively mild.   last night noted throbbing type sensation right ear but no tinnitus.  No hearing loss.  No headaches   Past Medical History  Diagnosis Date  . ALLERGIC RHINITIS 01/31/2008  . DIVERTICULITIS, HX OF 09/07/2008  . Eosinophilia 02/08/2009  . HYPERLIPIDEMIA 12/20/2006  . OSTEOPOROSIS 12/20/2006   Past Surgical History  Procedure Laterality Date  . Thyroid duct cyst    . Knee arthroscopy      left  . Neuroma surgery      x2 feet  . Cataract extraction bilateral w/ anterior vitrectomy  2013    bilateral cataracts    reports that she has never smoked. She does not have any smokeless tobacco history on file. She reports that she does not drink alcohol or use illicit drugs. family history includes COPD in her father; Heart disease in her father. Allergies  Allergen Reactions  . Penicillins     REACTION: rash  . Sulfonamide Derivatives     REACTION: rash      Review of Systems  Constitutional: Negative for fever and chills.  HENT: Positive for congestion and ear pain. Negative for ear discharge, facial swelling and hearing loss.   Neurological: Negative for headaches.       Objective:   Physical Exam  Constitutional: She appears well-developed and well-nourished.  HENT:  Left Ear: External ear normal.  Mouth/Throat: Oropharynx is clear and moist.  She has some cerumen right canal which is partially impacting, otherwise canal appears normal  Neck: Neck supple. No thyromegaly present.  Lymphadenopathy:    She has no cervical adenopathy.          Assessment & Plan:   right otalgia. She has some cerumen impaction. Will irrigate.  Cerumen removed with irrigation.  TM appears  normal.  Symptoms improved.

## 2013-11-13 NOTE — Progress Notes (Signed)
Pre visit review using our clinic review tool, if applicable. No additional management support is needed unless otherwise documented below in the visit note. 

## 2013-11-13 NOTE — Patient Instructions (Signed)
Cerumen Impaction A cerumen impaction is when the wax in your ear forms a plug. This plug usually causes reduced hearing. Sometimes it also causes an earache or dizziness. Removing a cerumen impaction can be difficult and painful. The wax sticks to the ear canal. The canal is sensitive and bleeds easily. If you try to remove a heavy wax buildup with a cotton tipped swab, you may push it in further. Irrigation with water, suction, and small ear curettes may be used to clear out the wax. If the impaction is fixed to the skin in the ear canal, ear drops may be needed for a few days to loosen the wax. People who build up a lot of wax frequently can use ear wax removal products available in your local drugstore. SEEK MEDICAL CARE IF:  You develop an earache, increased hearing loss, or marked dizziness. Document Released: 06/25/2004 Document Revised: 08/10/2011 Document Reviewed: 08/15/2009 Exit[employer] Patient Information 2014 [employer].  

## 2013-11-28 ENCOUNTER — Other Ambulatory Visit: Payer: Self-pay | Admitting: Physician Assistant

## 2013-11-28 DIAGNOSIS — C4492 Squamous cell carcinoma of skin, unspecified: Secondary | ICD-10-CM

## 2013-11-28 HISTORY — DX: Squamous cell carcinoma of skin, unspecified: C44.92

## 2014-01-08 ENCOUNTER — Other Ambulatory Visit: Payer: Self-pay | Admitting: Ophthalmology

## 2014-01-08 ENCOUNTER — Other Ambulatory Visit: Payer: Self-pay | Admitting: Internal Medicine

## 2014-02-01 ENCOUNTER — Ambulatory Visit: Payer: Medicare HMO | Admitting: Family Medicine

## 2014-03-05 ENCOUNTER — Encounter: Payer: Self-pay | Admitting: Internal Medicine

## 2014-04-06 ENCOUNTER — Encounter: Payer: Self-pay | Admitting: Family Medicine

## 2014-04-09 ENCOUNTER — Other Ambulatory Visit: Payer: Self-pay | Admitting: Internal Medicine

## 2014-04-12 ENCOUNTER — Encounter: Payer: Self-pay | Admitting: Internal Medicine

## 2014-04-19 ENCOUNTER — Ambulatory Visit (INDEPENDENT_AMBULATORY_CARE_PROVIDER_SITE_OTHER): Payer: Medicare HMO | Admitting: Family Medicine

## 2014-04-19 ENCOUNTER — Encounter: Payer: Self-pay | Admitting: Family Medicine

## 2014-04-19 VITALS — BP 126/74 | Temp 98.1°F | Ht 62.25 in | Wt 153.0 lb

## 2014-04-19 DIAGNOSIS — E785 Hyperlipidemia, unspecified: Secondary | ICD-10-CM

## 2014-04-19 DIAGNOSIS — R062 Wheezing: Secondary | ICD-10-CM

## 2014-04-19 DIAGNOSIS — Z23 Encounter for immunization: Secondary | ICD-10-CM

## 2014-04-19 DIAGNOSIS — I10 Essential (primary) hypertension: Secondary | ICD-10-CM

## 2014-04-19 DIAGNOSIS — J4540 Moderate persistent asthma, uncomplicated: Secondary | ICD-10-CM | POA: Insufficient documentation

## 2014-04-19 MED ORDER — ALBUTEROL SULFATE 108 (90 BASE) MCG/ACT IN AEPB: 1.0000 | INHALATION_SPRAY | Freq: Three times a day (TID) | RESPIRATORY_TRACT | Status: DC | PRN

## 2014-04-19 NOTE — Progress Notes (Signed)
Lisa Reddish, MD Phone: [phone]  Subjective:  Patient presents today to establish care with me as their new primary care provider. Patient was formerly a patient of Dr. Leanne Chang. Chief complaint-noted.   Hypertension-excellent control  BP Readings from Last 3 Encounters:  04/19/14 126/74  11/13/13 132/70  11/07/13 136/74  Tries to walk 2 miles a day Home BP monitoring-no Compliant with medications-yes without side effects ROS-Denies any CP, HA, SOB, blurry vision, LE edema   Hyperlipidemia-poor control with last LDL >140 not on statin  on statin: yes,  Regular exercise: walking as above ROS- no chest pain or shortness of breath. No myalgias  Wheeze-stable 2012 had what appeared to be asthma flare with wheezing and improved with albuterol has wheezing intermittently at night now and requires as needed albuterol. Seems to be worse in the winter. No current issues but likes to have albuterol on hand ROS- no cough or shortness of breath  Colonoscopy coming December 11th.   The following were reviewed and entered/updated in epic: Past Medical History  Diagnosis Date  . ALLERGIC RHINITIS 01/31/2008  . DIVERTICULITIS, HX OF 09/07/2008  . Eosinophilia 02/08/2009  . HYPERLIPIDEMIA 12/20/2006  . OSTEOPOROSIS 12/20/2006   Patient Active Problem List   Diagnosis Date Noted  . Wheeze 04/19/2014    Priority: Medium  . Hypertension 11/25/2010    Priority: Medium  . Hyperlipidemia 12/20/2006    Priority: Medium  . Eosinophilia 02/08/2009    Priority: Low  . Allergic rhinitis 01/31/2008    Priority: Low  . Osteopenia 12/20/2006    Priority: Low   Past Surgical History  Procedure Laterality Date  . Thyroid duct cyst    . Knee arthroscopy      left  . Neuroma surgery      x2 feet  . Cataract extraction bilateral w/ anterior vitrectomy  2013    bilateral cataracts    Family History  Problem Relation Age of Onset  . COPD Father   . Heart disease Father     stent placed 80     Medications- reviewed and updated Current Outpatient Prescriptions  Medication Sig Dispense Refill  . amLODipine (NORVASC) 5 MG tablet TAKE 1 TABLET BY MOUTH EVERY DAY 30 tablet 0  . metoprolol succinate (TOPROL-XL) 25 MG 24 hr tablet TAKE 1 TABLET BY MOUTH EVERY DAY 30 tablet 0  . mometasone (NASONEX) 50 MCG/ACT nasal spray INSTILL 2 SPRAYS IN THE NOSE EVERY DAY 17 g 1  . Albuterol Sulfate (PROAIR RESPICLICK) 093 (90 BASE) MCG/ACT AEPB Inhale 1 puff into the lungs every 8 (eight) hours as needed. 1 each 0  . [DISCONTINUED] albuterol (PROVENTIL) 90 MCG/ACT inhaler Inhale 2 puffs into the lungs every 6 (six) hours as needed for wheezing. 17 g 0  . [DISCONTINUED] pantoprazole (PROTONIX) 40 MG tablet Take 1 tablet by mouth daily.     No current facility-administered medications for this visit.    Allergies-reviewed and updated Allergies  Allergen Reactions  . Penicillins     REACTION: rash  . Sulfonamide Derivatives     REACTION: rash    History   Social History  . Marital Status: Married    Spouse Name: N/A    Number of Children: N/A  . Years of Education: N/A   Social History Main Topics  . Smoking status: Never Smoker   . Smokeless tobacco: None  . Alcohol Use: No  . Drug Use: No  . Sexual Activity: None   Other Topics Concern  . None  Social History Narrative   Married. No kids- 2 step kids. 5 step grandkids.       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 Oropharynx normal. Dentures on top, implant dentures on bottom.  CV: RRR no murmurs rubs or gallops Lungs: CTAB no crackles, wheeze, rhonchi Abdomen: soft/nontender/nondistended/normal bowel sounds.  Ext: no edema Skin: warm,  dry, no rash  Neuro: PERRLA   Assessment/Plan:  Wheeze I provided a refill for albuterol with coupon for free proair respiclick. I discussed with patient we really needed to consider PFTs in the future.   Hypertension Excellent control on amlodipine 5 mg of metoprolol 25 mg. Continue current medications  Hyperlipidemia We had a long discussion today about benefits and risks of statins. Patient is in the potential benefit group  For primary prevention. She is worried about potential side effects of statins and decides not to take 1. We discussed that she ever had an event she would definitely need to take one. I will plan on discussing on aspirin as a middle ground next visit.   Return in 6 months for a morning visit with fasting labs.   Orders Placed This Encounter  Procedures  . Pneumococcal conjugate vaccine 13-valent IM   Meds ordered this encounter  Medications  . Albuterol Sulfate (PROAIR RESPICLICK) 400 (90 BASE) MCG/ACT AEPB    Sig: Inhale 1 puff into the lungs every 8 (eight) hours as needed.    Dispense:  1 each    Refill:  0

## 2014-04-19 NOTE — Patient Instructions (Signed)
Come in for a morning appointment in 6 months and we will check labs  Things look great today from blood pressure perspective.   Cholesterol is high but you made a decision that risks are higher to you than potential benefits for cholesterol medicatoins  Pneumonia shot today -final one

## 2014-04-19 NOTE — Progress Notes (Signed)
Pre visit review using our clinic review tool, if applicable. No additional management support is needed unless otherwise documented below in the visit note. 

## 2014-04-20 NOTE — Assessment & Plan Note (Signed)
I provided a refill for albuterol with coupon for free proair respiclick. I discussed with patient we really needed to consider PFTs in the future.

## 2014-04-20 NOTE — Assessment & Plan Note (Signed)
We had a long discussion today about benefits and risks of statins. Patient is in the potential benefit group  For primary prevention. She is worried about potential side effects of statins and decides not to take 1. We discussed that she ever had an event she would definitely need to take one. I will plan on discussing on aspirin as a middle ground next visit.

## 2014-04-20 NOTE — Assessment & Plan Note (Signed)
Excellent control on amlodipine 5 mg of metoprolol 25 mg. Continue current medications

## 2014-05-04 ENCOUNTER — Ambulatory Visit (AMBULATORY_SURGERY_CENTER): Payer: Self-pay | Admitting: *Deleted

## 2014-05-04 VITALS — Ht 62.0 in | Wt 153.8 lb

## 2014-05-04 DIAGNOSIS — Z1211 Encounter for screening for malignant neoplasm of colon: Secondary | ICD-10-CM

## 2014-05-04 MED ORDER — MOVIPREP 100 G PO SOLR: 1.0000 | Freq: Once | ORAL | Status: DC

## 2014-05-04 NOTE — Progress Notes (Signed)
No egg or soy allergy. ewm No issues with past sedation. ewm No home 02 use. ewm No diet pills. ewm Pt declined emmi. ewm  

## 2014-05-08 ENCOUNTER — Other Ambulatory Visit: Payer: Self-pay | Admitting: Internal Medicine

## 2014-05-11 ENCOUNTER — Ambulatory Visit (AMBULATORY_SURGERY_CENTER): Payer: Medicare HMO | Admitting: Internal Medicine

## 2014-05-11 ENCOUNTER — Encounter: Payer: Self-pay | Admitting: Internal Medicine

## 2014-05-11 VITALS — BP 179/82 | HR 70 | Temp 96.5°F | Resp 23 | Ht 62.0 in | Wt 153.0 lb

## 2014-05-11 DIAGNOSIS — Z1211 Encounter for screening for malignant neoplasm of colon: Secondary | ICD-10-CM

## 2014-05-11 MED ORDER — SODIUM CHLORIDE 0.9 % IV SOLN: 500.0000 mL | INTRAVENOUS | Status: DC

## 2014-05-11 NOTE — Patient Instructions (Signed)
YOU HAD AN ENDOSCOPIC PROCEDURE TODAY AT THE [facility]: Refer to the procedure report that was given to you for any specific questions about what was found during the examination.  If the procedure report does not answer your questions, please call your gastroenterologist to clarify.  If you requested that your care partner not be given the details of your procedure findings, then the procedure report has been included in a sealed envelope for you to review at your convenience later.  YOU SHOULD EXPECT: Some feelings of bloating in the abdomen. Passage of more gas than usual.  Walking can help get rid of the air that was put into your GI tract during the procedure and reduce the bloating. If you had a lower endoscopy (such as a colonoscopy or flexible sigmoidoscopy) you may notice spotting of blood in your stool or on the toilet paper. If you underwent a bowel prep for your procedure, then you may not have a normal bowel movement for a few days.  DIET: Your first meal following the procedure should be a light meal and then it is ok to progress to your normal diet.  A half-sandwich or bowl of soup is an example of a good first meal.  Heavy or fried foods are harder to digest and may make you feel nauseous or bloated.  Likewise meals heavy in dairy and vegetables can cause extra gas to form and this can also increase the bloating.  Drink plenty of fluids but you should avoid alcoholic beverages for 24 hours.  ACTIVITY: Your care partner should take you home directly after the procedure.  You should plan to take it easy, moving slowly for the rest of the day.  You can resume normal activity the day after the procedure however you should NOT DRIVE or use heavy machinery for 24 hours (because of the sedation medicines used during the test).    SYMPTOMS TO REPORT IMMEDIATELY: A gastroenterologist can be reached at any hour.  During normal business hours, 8:30 AM to 5:00 PM Monday through Friday,  call [phone].  After hours and on weekends, please call the GI answering service at [phone] who will take a message and have the physician on call contact you.   Following lower endoscopy (colonoscopy or flexible sigmoidoscopy):  Excessive amounts of blood in the stool  Significant tenderness or worsening of abdominal pains  Swelling of the abdomen that is new, acute  Fever of 100F or higher    FOLLOW UP: If any biopsies were taken you will be contacted by phone or by letter within the next 1-3 weeks.  Call your gastroenterologist if you have not heard about the biopsies in 3 weeks.  Our staff will call the home number listed on your records the next business day following your procedure to check on you and address any questions or concerns that you may have at that time regarding the information given to you following your procedure. This is a courtesy call and so if there is no answer at the home number and we have not heard from you through the emergency physician on call, we will assume that you have returned to your regular daily activities without incident.  SIGNATURES/CONFIDENTIALITY: You and/or your care partner have signed paperwork which will be entered into your electronic medical record.  These signatures attest to the fact that that the information above on your After Visit Summary has been reviewed and is understood.  Full responsibility of the confidentiality   of this discharge information lies with you and/or your care-partner.  Diverticulosis and high fiber diet information given.  No recall due to age. 

## 2014-05-11 NOTE — Progress Notes (Signed)
Stable to RR 

## 2014-05-11 NOTE — Op Note (Signed)
[place]  [address]. [place], [address]   COLONOSCOPY PROCEDURE REPORT  PATIENT: Lisa Greene, Lisa Greene  MR#: [id number] BIRTHDATE: 1940-08-02 , 73  yrs. old GENDER: female ENDOSCOPIST: Lafayette Dragon, MD REFERRED BY:Dr S.Hunter PROCEDURE DATE:  05/11/2014 PROCEDURE:   Colonoscopy, screening First Screening Colonoscopy - Avg.  risk and is 50 yrs.  old or older - No.  Prior Negative Screening - Now for repeat screening. 10 or more years since last screening  History of Adenoma - Now for follow-up colonoscopy & has been > or = to 3 yrs.  N/A  Polyps Removed Today? No.  Polyps Removed Today? No.  Recommend repeat exam, <10 yrs? Polyps Removed Today? No.  Recommend repeat exam, <10 yrs? No. ASA CLASS:   Class II INDICATIONS:prior colonoscopy in January 2005 showed diverticulosis.  MEDICATIONS: Monitored anesthesia care and Propofol 250 mg IV  DESCRIPTION OF PROCEDURE:   After the risks benefits and alternatives of the procedure were thoroughly explained, informed consent was obtained.  The digital rectal exam revealed no abnormalities of the rectum.   The LB [id number]  endoscope was introduced through the anus and advanced to the cecum, which was identified by both the appendix and ileocecal valve. No adverse events experienced.   The quality of the prep was good, using MoviPrep  The instrument was then slowly withdrawn as the colon was fully examined.      COLON FINDINGS: There was moderate diverticulosis noted in the descending colon with associated tortuosity.  Retroflexed views revealed no abnormalities. The time to cecum=7 minutes 10 seconds. Withdrawal time=6 minutes 48 seconds.  The scope was withdrawn and the procedure completed. COMPLICATIONS: There were no immediate complications.  ENDOSCOPIC IMPRESSION: There was moderate diverticulosis noted in the descending colon  RECOMMENDATIONS: High fiber diet No recall due to age  eSigned:  Lafayette Dragon, MD 05/11/2014 9:38 AM   cc:

## 2014-05-14 ENCOUNTER — Telehealth: Payer: Self-pay

## 2014-05-14 NOTE — Telephone Encounter (Signed)
  Follow up Call-  Call back number 05/11/2014  Post procedure Call Back phone  # [phone]  Permission to leave phone message Yes     Patient questions:  Do you have a fever, pain , or abdominal swelling? No. Pain Score  0 *  Have you tolerated food without any problems? Yes.    Have you been able to return to your normal activities? Yes.    Do you have any questions about your discharge instructions: Diet   No. Medications  No. Follow up visit  No.  Do you have questions or concerns about your Care? No.  Actions: * If pain score is 4 or above: No action needed, pain <4.

## 2014-05-21 ENCOUNTER — Encounter: Payer: Self-pay | Admitting: Family Medicine

## 2014-05-21 DIAGNOSIS — Z1211 Encounter for screening for malignant neoplasm of colon: Secondary | ICD-10-CM | POA: Insufficient documentation

## 2014-05-21 HISTORY — DX: Encounter for screening for malignant neoplasm of colon: Z12.11

## 2014-06-28 ENCOUNTER — Other Ambulatory Visit: Payer: Self-pay

## 2014-06-28 MED ORDER — METOPROLOL SUCCINATE ER 25 MG PO TB24: 25.0000 mg | ORAL_TABLET | Freq: Every day | ORAL | Status: DC

## 2014-06-28 MED ORDER — AMLODIPINE BESYLATE 5 MG PO TABS: 5.0000 mg | ORAL_TABLET | Freq: Every day | ORAL | Status: DC

## 2014-07-19 ENCOUNTER — Other Ambulatory Visit: Payer: Self-pay | Admitting: Dermatology

## 2014-08-13 ENCOUNTER — Ambulatory Visit (INDEPENDENT_AMBULATORY_CARE_PROVIDER_SITE_OTHER): Payer: Medicare HMO | Admitting: Family Medicine

## 2014-08-13 ENCOUNTER — Encounter: Payer: Self-pay | Admitting: Family Medicine

## 2014-08-13 VITALS — BP 148/78 | HR 76 | Temp 98.8°F | Wt 155.0 lb

## 2014-08-13 DIAGNOSIS — R062 Wheezing: Secondary | ICD-10-CM

## 2014-08-13 DIAGNOSIS — R059 Cough, unspecified: Secondary | ICD-10-CM

## 2014-08-13 DIAGNOSIS — R0602 Shortness of breath: Secondary | ICD-10-CM

## 2014-08-13 DIAGNOSIS — R05 Cough: Secondary | ICD-10-CM

## 2014-08-13 MED ORDER — PREDNISONE 20 MG PO TABS: ORAL_TABLET | ORAL | Status: DC

## 2014-08-13 NOTE — Patient Instructions (Addendum)
Let's try to calm your wheezing/cough/breathing issues down with prednisone (strong antiinflammatory). Albuterol 2x today and 3x tomorrow.   Come back to see me if not better in about 2 weeks.   If you improve, give me a call in about a month and we can refer to allergy and asthma for evaluation including lung function tests

## 2014-08-13 NOTE — Progress Notes (Signed)
  Garret Reddish, MD Phone: [phone]  Subjective:   Lisa Greene is a 74 y.o. year old very pleasant female patient who presents with the following:  Cough and wheeze -deep cough coming and going along with wheezing sensation. Seems to hear a humming sound from chest. Occasional shortness of breath associated with the humming. Has used albuterol twice and seems to help the shortness of breath and some with the cough. Persistent symptoms. Mild shortness of breath. Coughing fits make things worse, sitting up helps some. Has gained 3-4 pounds in last few weeks.   Has tried benadryl and seems to help some with cough (but not the SOB periods). Took claritin and helped some (once again with cough portion). Taking her nasonex  ROS- no chest pain, no fever/chills/nausea/vomiting  Past Medical History- Patient Active Problem List   Diagnosis Date Noted  . Wheeze 04/19/2014    Priority: Medium  . Hypertension 11/25/2010    Priority: Medium  . Hyperlipidemia 12/20/2006    Priority: Medium  . Eosinophilia 02/08/2009    Priority: Low  . Allergic rhinitis 01/31/2008    Priority: Low  . Osteopenia 12/20/2006    Priority: Low   Medications- reviewed and updated Current Outpatient Prescriptions  Medication Sig Dispense Refill  . Albuterol Sulfate (PROAIR RESPICLICK) 562 (90 BASE) MCG/ACT AEPB Inhale 1 puff into the lungs every 8 (eight) hours as needed. 1 each 0  . amLODipine (NORVASC) 5 MG tablet Take 1 tablet (5 mg total) by mouth daily. 90 tablet 1  . cholecalciferol (VITAMIN D) 1000 UNITS tablet Take 2,000 Units by mouth daily.    . metoprolol succinate (TOPROL-XL) 25 MG 24 hr tablet Take 1 tablet (25 mg total) by mouth daily. 90 tablet 1  . mometasone (NASONEX) 50 MCG/ACT nasal spray INSTILL 2 SPRAYS IN THE NOSE EVERY DAY 17 g 1     Objective: BP 148/78 mmHg  Pulse 76  Temp(Src) 98.8 F (37.1 C)  Wt 155 lb (70.308 kg)  SpO2 98% Gen: NAD, resting comfortably in chair TM  normal, MMM, oropharynx normal with some mild pharyngeal erythema CV: RRR no murmurs rubs or gallops Lungs: good air movement, no crackles,  Rhonchi, occasional diffuse wheeze Ext: no edema Skin: warm, dry, no rash Neuro: grossly normal, moves all extremities   Assessment/Plan:  Wheeze/Cough/Shortness of breath Wheeze and shortness of breath resolves with albuterol. Cough improves as well. History of ? Asthma in 2012. We will treat current issues with course of prednisone and scheduled albuterol. Once patient improved from current ailment, she will alert me and we will refer to [facility] allergy and asthma. She will need PFTs and has some portions of this which may be allergic rhinitis as well.   Return precautions advised. Discussed BP. Was up at colonoscopy and higher today than previous. Home readings in 120s. We will monitor for now.    Meds ordered this encounter  Medications  . predniSONE (DELTASONE) 20 MG tablet    Sig: Take 2 pills for 2 days then 1 pill for 3 days, then 1/2 pill for 2 days    Dispense:  8 tablet    Refill:  0

## 2014-08-13 NOTE — Assessment & Plan Note (Signed)
Wheeze and shortness of breath resolves with albuterol. Cough improves as well. History of ? Asthma in 2012. We will treat current issues with course of prednisone and scheduled albuterol. Once patient improved from current ailment, she will alert me and we will refer to [facility] allergy and asthma. She will need PFTs and has some portions of this which may be allergic rhinitis as well.

## 2014-08-23 ENCOUNTER — Ambulatory Visit (INDEPENDENT_AMBULATORY_CARE_PROVIDER_SITE_OTHER): Payer: Medicare HMO | Admitting: Family Medicine

## 2014-08-23 ENCOUNTER — Encounter: Payer: Self-pay | Admitting: Family Medicine

## 2014-08-23 VITALS — BP 150/64 | HR 84 | Temp 98.8°F | Wt 153.0 lb

## 2014-08-23 DIAGNOSIS — J069 Acute upper respiratory infection, unspecified: Secondary | ICD-10-CM

## 2014-08-23 DIAGNOSIS — J309 Allergic rhinitis, unspecified: Secondary | ICD-10-CM

## 2014-08-23 NOTE — Patient Instructions (Addendum)
Add claritin to nasonex for daily use Cold portion should clear within about 10 more days, hopeful allergies will be better within 2 weeks. You may have both allergies and an upper respiratory infection.  No signs bacterial infection.   Use claritin and nasonex over next month  I do not like your blood pressure trend, let's check in next month when hopefully you are feeling better-if blood pressure remains high, may need to increase medication  BP Readings from Last 3 Encounters:  08/23/14 150/64  08/13/14 148/78  05/11/14 179/82

## 2014-08-23 NOTE — Progress Notes (Signed)
  Garret Reddish, MD Phone: [phone]  Subjective:   Lisa Greene is a 74 y.o. year old very pleasant female patient who presents with the following:  Cough/burning and watery eyes/sore throat -Patient got at least 80% better on prednisone for recent possible asthma exacerbation. At tail end of treatment, started with sore throat and then developed a new cough. Had been outside some and started having some watery itchy/burning eyes. Restarted nasonex with only mild relief. Symptoms have persisted over last 4 days since starting. No other treatments tried. Occasional chill. No wheeze from lungs, thought she heard some upper airway wheeze last night. Used albuterol 1x last night for cough and seemed to help some.   ROS- no fever/nausea/vomiting. No chest pain or shortness of breath.   Past Medical History- Patient Active Problem List   Diagnosis Date Noted  . Wheeze 04/19/2014    Priority: Medium  . Hypertension 11/25/2010    Priority: Medium  . Hyperlipidemia 12/20/2006    Priority: Medium  . Eosinophilia 02/08/2009    Priority: Low  . Allergic rhinitis 01/31/2008    Priority: Low  . Osteopenia 12/20/2006    Priority: Low   Medications- reviewed and updated Current Outpatient Prescriptions  Medication Sig Dispense Refill  . amLODipine (NORVASC) 5 MG tablet Take 1 tablet (5 mg total) by mouth daily. 90 tablet 1  . cholecalciferol (VITAMIN D) 1000 UNITS tablet Take 2,000 Units by mouth daily.    . metoprolol succinate (TOPROL-XL) 25 MG 24 hr tablet Take 1 tablet (25 mg total) by mouth daily. 90 tablet 1  . Albuterol Sulfate (PROAIR RESPICLICK) 791 (90 BASE) MCG/ACT AEPB Inhale 1 puff into the lungs every 8 (eight) hours as needed. (Patient not taking: Reported on 08/23/2014) 1 each 0  . mometasone (NASONEX) 50 MCG/ACT nasal spray INSTILL 2 SPRAYS IN THE NOSE EVERY DAY (Patient not taking: Reported on 08/23/2014) 17 g 1  . [DISCONTINUED] albuterol (PROVENTIL) 90 MCG/ACT inhaler  Inhale 2 puffs into the lungs every 6 (six) hours as needed for wheezing. 17 g 0  . [DISCONTINUED] pantoprazole (PROTONIX) 40 MG tablet Take 1 tablet by mouth daily.     Objective: BP 150/64 mmHg  Pulse 84  Temp(Src) 98.8 F (37.1 C)  Wt 153 lb (69.4 kg)  SpO2 96% Gen: NAD, resting comfortably HEENT: nares erythematous and swollen, oropharynx normal without pharyngeal exudate but some cobblestoning noted, TM normal bilaterally, Mucous membranes are moist.sinuses nontender. Some watering of eyes.  CV: RRR no murmurs rubs or gallops Lungs: CTAB no crackles, wheeze, rhonchi Ext: no edema Skin: warm, dry, no rash Neuro: grossly normal, moves all extremities, normal gait  Assessment/Plan:  Allergic Rhinitis (possibly with viral URI as well) Add claritin to nasonex for daily use Cold portion should clear within about 10 more days, hopeful allergies will be better within 2 weeks.  No signs bacterial infection.   Return precautions advised. Keeping an eye on blood pressure-higher while ill but previously controled. 1 month follow up.

## 2014-08-24 ENCOUNTER — Telehealth: Payer: Self-pay | Admitting: Family Medicine

## 2014-08-27 ENCOUNTER — Telehealth: Payer: Self-pay | Admitting: Family Medicine

## 2014-08-27 MED ORDER — MOMETASONE FUROATE 50 MCG/ACT NA SUSP: NASAL | Status: DC

## 2014-08-27 NOTE — Telephone Encounter (Signed)
This is a [employer] patient.  Forwarded to [employer]

## 2014-08-27 NOTE — Telephone Encounter (Signed)
Rx called in 

## 2014-08-27 NOTE — Telephone Encounter (Signed)
Medication sent in. 

## 2014-08-27 NOTE — Telephone Encounter (Signed)
Pt needs new rx nasonex call into [facility] in [place] [phone]

## 2014-10-03 ENCOUNTER — Ambulatory Visit (INDEPENDENT_AMBULATORY_CARE_PROVIDER_SITE_OTHER): Payer: Medicare HMO | Admitting: Family Medicine

## 2014-10-03 ENCOUNTER — Encounter: Payer: Self-pay | Admitting: Family Medicine

## 2014-10-03 VITALS — BP 154/72 | HR 83 | Temp 98.5°F | Wt 153.0 lb

## 2014-10-03 DIAGNOSIS — I10 Essential (primary) hypertension: Secondary | ICD-10-CM

## 2014-10-03 DIAGNOSIS — R062 Wheezing: Secondary | ICD-10-CM | POA: Diagnosis not present

## 2014-10-03 DIAGNOSIS — E785 Hyperlipidemia, unspecified: Secondary | ICD-10-CM

## 2014-10-03 LAB — COMPREHENSIVE METABOLIC PANEL
ALT: 17 U/L (ref 0–35)
AST: 20 U/L (ref 0–37)
Albumin: 4.3 g/dL (ref 3.5–5.2)
Alkaline Phosphatase: 65 U/L (ref 39–117)
BUN: 12 mg/dL (ref 6–23)
CO2: 26 mEq/L (ref 19–32)
Calcium: 9.4 mg/dL (ref 8.4–10.5)
Chloride: 106 mEq/L (ref 96–112)
Creatinine, Ser: 0.97 mg/dL (ref 0.40–1.20)
GFR: 59.63 mL/min — ABNORMAL LOW (ref >60.00)
Glucose, Bld: 95 mg/dL (ref 70–99)
Potassium: 4.1 mEq/L (ref 3.5–5.1)
Sodium: 138 mEq/L (ref 135–145)
Total Bilirubin: 0.6 mg/dL (ref 0.2–1.2)
Total Protein: 7.2 g/dL (ref 6.0–8.3)

## 2014-10-03 LAB — LIPID PANEL
CHOLESTEROL: 250 mg/dL — AB (ref 0–200)
HDL: 63.4 mg/dL (ref >39.00)
LDL CALC: 161 mg/dL — AB (ref 0–99)
NonHDL: 186.6
TRIGLYCERIDES: 129 mg/dL (ref 0.0–149.0)
Total CHOL/HDL Ratio: 4
VLDL: 25.8 mg/dL (ref 0.0–40.0)

## 2014-10-03 LAB — CBC
HCT: 41 % (ref 36.0–46.0)
Hemoglobin: 14.3 g/dL (ref 12.0–15.0)
MCHC: 34.7 g/dL (ref 30.0–36.0)
MCV: 88.5 fl (ref 78.0–100.0)
Platelets: 309 10*3/uL (ref 150.0–400.0)
RBC: 4.64 Mil/uL (ref 3.87–5.11)
RDW: 13 % (ref 11.5–15.5)
WBC: 5.9 10*3/uL (ref 4.0–10.5)

## 2014-10-03 LAB — TSH: TSH: 2.52 u[IU]/mL (ref 0.35–4.50)

## 2014-10-03 MED ORDER — AMLODIPINE BESYLATE 10 MG PO TABS: 10.0000 mg | ORAL_TABLET | Freq: Every day | ORAL | Status: DC

## 2014-10-03 NOTE — Assessment & Plan Note (Signed)
Poor control on Amlodipine 5mg , metoprolol 25 mg XL though home readings are reasonable per JNC 8. Increase amlodipine to 10mg . May have to come off metoprolol if does has asthma but has had palpitations in past that this has helped. Consider HCTZ as next step.

## 2014-10-03 NOTE — Patient Instructions (Addendum)
Fasting labs today. Calculate 10 year cardiac risk though I doubt I would start statin given no history heart attack or stroke  Increase amlodipine to 10mg , continue home monitoring. See me in 3 months unless BP regularly over 140/90.   We will call you within a week about your referral to allergy/asthma for your wheeze. If you do not hear within 2 weeks, give [place] a call.

## 2014-10-03 NOTE — Assessment & Plan Note (Signed)
Wants to know lipids but is not on meds. Patient has opted against statin given age near 65 and no CVA/MI. We will calculate 10 year risk and let her make a decision. Risks may outweigh benefits.

## 2014-10-03 NOTE — Assessment & Plan Note (Signed)
i think patient needs PFTs. She is concerned about allergen exposure causing symptoms. We agreed to refer to allergy/asthma [place] for further evaluation given intermittent issues since 2012 requiring albuterol. Possible asthma

## 2014-10-03 NOTE — Progress Notes (Signed)
Garret Reddish, MD  Subjective:  Lisa Greene is a 74 y.o. year old very pleasant female patient who presents with:  Hypertension-poor control at office, reasonable at home per JNC 8  BP Readings from Last 3 Encounters:  10/03/14 154/72  08/23/14 150/64  08/13/14 148/78   Home BP monitoring-raninging from 129-144 over 68-75 Compliant with medications-yes without side effects, except potential asthma related issues ROS-Denies any CP, HA,  blurry vision, LE edema, transient weakness, orthopnea, PND.   Asthma concern/wheeze-recurrent concern -intermittent wheezing/coughing for 2 months. Has been using albuterol intermittently with these flares. Using nasonex and claritin. Walking an hour on treadmill each day and doing well without shortness of breath but if is wheezing has some shortness of breath improved by inhaler  Previously had noted "2012 had what appeared to be asthma flare with wheezing and improved with albuterol has wheezing intermittently at night now and requires as needed albuterol. Consider PFTs in future"  ROS- some itchy eyes, no watery eyes or runny nose  Hyperlipidemia-poor control in past without rx, fasting today  Lab Results  Component Value Date   LDLCALC 139* 11/19/2010   On statin: no Regular exercise: yes walks daily on treadmill ROS- no chest pain or shortness of breath. No myalgias  Past Medical History- known eosiophilia chronically, allergic rhinits, osteopenia  Medications- reviewed and updated Current Outpatient Prescriptions  Medication Sig Dispense Refill  . Albuterol Sulfate (PROAIR RESPICLICK) 364 (90 BASE) MCG/ACT AEPB Inhale 1 puff into the lungs every 8 (eight) hours as needed. 1 each 0  . amLODipine (NORVASC) 5 MG tablet Take 1 tablet (5 mg total) by mouth daily. 90 tablet 1  . cholecalciferol (VITAMIN D) 1000 UNITS tablet Take 2,000 Units by mouth daily.    . metoprolol succinate (TOPROL-XL) 25 MG 24 hr tablet Take 1 tablet (25 mg total)  by mouth daily. 90 tablet 1  . mometasone (NASONEX) 50 MCG/ACT nasal spray INSTILL 2 SPRAYS IN THE NOSE EVERY DAY 17 g 1  . NASONEX 50 MCG/ACT nasal spray INSTILL 2 SPRAYS IN THE NOSE EVERY DAY 17 g 0  . [DISCONTINUED] albuterol (PROVENTIL) 90 MCG/ACT inhaler Inhale 2 puffs into the lungs every 6 (six) hours as needed for wheezing. 17 g 0  . [DISCONTINUED] pantoprazole (PROTONIX) 40 MG tablet Take 1 tablet by mouth daily.     Objective: BP 154/72 mmHg  Pulse 83  Temp(Src) 98.5 F (36.9 C)  Wt 153 lb (69.4 kg)  SpO2 95% Gen: NAD, resting comfortably in chair CV: RRR no murmurs rubs or gallops Lungs: CTAB no crackles, wheeze, rhonchi Abdomen: soft/nontender/nondistended/normal bowel sounds.  Ext: no edema on amlodipine 5mg  Skin: warm, dry, no rash Neuro: grossly normal, moves all extremities   Assessment/Plan:  Wheeze i think patient needs PFTs. She is concerned about allergen exposure causing symptoms. We agreed to refer to allergy/asthma [place] for further evaluation given intermittent issues since 2012 requiring albuterol. Possible asthma   Hypertension Poor control on Amlodipine 5mg , metoprolol 25 mg XL though home readings are reasonable per JNC 8. Increase amlodipine to 10mg . May have to come off metoprolol if does has asthma but has had palpitations in past that this has helped. Consider HCTZ as next step.    Hyperlipidemia Wants to know lipids but is not on meds. Patient has opted against statin given age near 32 and no CVA/MI. We will calculate 10 year risk and let her make a decision. Risks may outweigh benefits.    3 month BP  recheck  Fasting labs Orders Placed This Encounter  Procedures  . CBC    [place]  . Comprehensive metabolic panel    [place]    Order Specific Question:  Has the patient fasted?    Answer:  No  . TSH    [place]  . Lipid panel    [place]    Order Specific Question:  Has the patient fasted?    Answer:  No  . Ambulatory referral to  Allergy    Referral Priority:  Routine    Referral Type:  Allergy Testing    Referral Reason:  Specialty Services Required    Requested Specialty:  Allergy    Number of Visits Requested:  1    Meds ordered this encounter  Medications  . amLODipine (NORVASC) 10 MG tablet    Sig: Take 1 tablet (10 mg total) by mouth daily.    Dispense:  30 tablet    Refill:  5

## 2014-10-08 ENCOUNTER — Ambulatory Visit
Admission: RE | Admit: 2014-10-08 | Discharge: 2014-10-08 | Disposition: A | Discharge status: Home / Self Care | Payer: Medicare HMO | Source: Ambulatory Visit | Attending: Allergy | Admitting: Allergy

## 2014-10-08 ENCOUNTER — Telehealth: Payer: Self-pay | Admitting: Family Medicine

## 2014-10-08 ENCOUNTER — Other Ambulatory Visit: Payer: Self-pay | Admitting: Allergy

## 2014-10-08 DIAGNOSIS — J4540 Moderate persistent asthma, uncomplicated: Secondary | ICD-10-CM

## 2014-10-08 MED ORDER — HYDROCHLOROTHIAZIDE 25 MG PO TABS: 25.0000 mg | ORAL_TABLET | Freq: Every day | ORAL | Status: DC

## 2014-10-08 NOTE — Telephone Encounter (Signed)
Pt said these are her blood pressure readings while taking the 10mg  10/03/14 thru 10/08/14    She would like a call back       145/74/75 150/73/71 145/67/80 153/76/89 144/75/84 150/87/67

## 2014-10-08 NOTE — Telephone Encounter (Signed)
See below

## 2014-10-08 NOTE — Telephone Encounter (Signed)
About 50% of blood pressures are too high. Let's add HCTZ 25 mg #30 take daily and see patient back 2 weeks from now. Bring home readings with her.

## 2014-10-09 ENCOUNTER — Telehealth: Payer: Self-pay | Admitting: Family Medicine

## 2014-10-09 NOTE — Telephone Encounter (Signed)
Pt notified and will call back to schedule 2 week f/u visit

## 2014-10-09 NOTE — Telephone Encounter (Signed)
Pt said she picked up the following med hydrochlorothiazide (HYDRODIURIL) 25 MG tablet and it say sensitive to sunlight. She said she is outside a lot and this will not work for her. She is asking if there is something else she can take .

## 2014-10-09 NOTE — Telephone Encounter (Signed)
Called and lm for pt tcb. 

## 2014-10-09 NOTE — Telephone Encounter (Signed)
That label probably means leave the medicine inside-not that she cannot be outside while taking the medicine. Tell her not to put the medicine in direct sunlight. She can bring the bottle and her concern in to next visit as well.

## 2014-10-09 NOTE — Telephone Encounter (Signed)
See below

## 2014-10-10 NOTE — Telephone Encounter (Signed)
Spoke with pt and relayed below information, pt verbalized understanding.

## 2014-10-20 LAB — HM MAMMOGRAPHY: HM Mammogram: NORMAL

## 2014-10-22 ENCOUNTER — Encounter: Payer: Self-pay | Admitting: Family Medicine

## 2014-10-23 ENCOUNTER — Encounter: Payer: Self-pay | Admitting: Family Medicine

## 2014-10-23 ENCOUNTER — Ambulatory Visit (INDEPENDENT_AMBULATORY_CARE_PROVIDER_SITE_OTHER): Payer: Medicare HMO | Admitting: Family Medicine

## 2014-10-23 VITALS — BP 128/72 | HR 71 | Temp 98.2°F | Wt 150.0 lb

## 2014-10-23 DIAGNOSIS — J4540 Moderate persistent asthma, uncomplicated: Secondary | ICD-10-CM | POA: Diagnosis not present

## 2014-10-23 DIAGNOSIS — E785 Hyperlipidemia, unspecified: Secondary | ICD-10-CM | POA: Diagnosis not present

## 2014-10-23 DIAGNOSIS — I10 Essential (primary) hypertension: Secondary | ICD-10-CM

## 2014-10-23 NOTE — Assessment & Plan Note (Signed)
Well controlled. Continue current meds: Hctz, 25mg , metoprolol 25 mg XL Stopped amlodipine which was not planned but seems to control BP better so continue current rx. Consider low dose amlodipine as next step

## 2014-10-23 NOTE — Patient Instructions (Signed)
Blood pressure looks much better with switch from amlodipine to HCTZ (although planned for both). Let's continue on HCTZ and metoprolol alone for now.   Follow up 6 months with kidney test a week before. May need to start cholesterol medicine if any worse. Hopeful we don't need this.   Glad we finally got to the bottom of the cough! Thanks for seeing the allergist

## 2014-10-23 NOTE — Assessment & Plan Note (Signed)
2016- new diagnosis asthma per [facility] allergy/asthma. Symptoms much improved on singulair and symbicort and well controlled and continue current rx.

## 2014-10-23 NOTE — Assessment & Plan Note (Signed)
No rx statin per patient. Willing to trial red yeast rice. She will have LDL checked before follow up visit.

## 2014-10-23 NOTE — Progress Notes (Signed)
Garret Reddish, MD  Subjective:  Lisa Greene is a 74 y.o. year old very pleasant female patient who presents with:  Hypertension-controlled on metorprolol and HCTZ alone ( was supposed to also be taking amlodipine)  BP Readings from Last 3 Encounters:  10/23/14 128/72  10/03/14 154/72  08/23/14 150/64   Home BP monitoring-majority of readings over 2 weeks <140/90. 3 readings right at 140 SBP Compliant with medications-yes without side effects, metop and hctz alone.  ROS-Denies any CP, HA, SOB, blurry vision, LE edema, transient weakness, orthopnea, PND.   Hyperlipidemia-poor control  Lab Results  Component Value Date   LDLCALC 161* 10/03/2014   On statin: no Regular exercise: yes ROS- no chest pain or shortness of breath. No myalgias  Asthma, moderate persistent, now controlled -sent to allergist for chronic cough and found to have asthma. On singulair and symbicort, has not had to use albuterol and cough has stopped ROS- denies recent wheeze  Past Medical History- eosinophilia, allergic rhinitis, osteopenia  Medications- reviewed and updated Current Outpatient Prescriptions  Medication Sig Dispense Refill  . amLODipine (NORVASC) 10 MG tablet  STOPPED TAKING after starting HCTZ Take 1 tablet (10 mg total) by mouth daily. 30 tablet 5  . cholecalciferol (VITAMIN D) 1000 UNITS tablet Take 2,000 Units by mouth daily.    . fluticasone (FLONASE) 50 MCG/ACT nasal spray     . hydrochlorothiazide (HYDRODIURIL) 25 MG tablet Take 1 tablet (25 mg total) by mouth daily. 30 tablet 0  . metoprolol succinate (TOPROL-XL) 25 MG 24 hr tablet Take 1 tablet (25 mg total) by mouth daily. 90 tablet 1  . mometasone (NASONEX) 50 MCG/ACT nasal spray INSTILL 2 SPRAYS IN THE NOSE EVERY DAY 17 g 1  . montelukast (SINGULAIR) 10 MG tablet     . NASONEX 50 MCG/ACT nasal spray INSTILL 2 SPRAYS IN THE NOSE EVERY DAY 17 g 0  . SYMBICORT 160-4.5 MCG/ACT inhaler     . Albuterol Sulfate (PROAIR RESPICLICK)  790 (90 BASE) MCG/ACT AEPB Inhale 1 puff into the lungs every 8 (eight) hours as needed. (Patient not taking: Reported on 10/23/2014) 1 each 0  . [DISCONTINUED] albuterol (PROVENTIL) 90 MCG/ACT inhaler Inhale 2 puffs into the lungs every 6 (six) hours as needed for wheezing. 17 g 0  . [DISCONTINUED] pantoprazole (PROTONIX) 40 MG tablet Take 1 tablet by mouth daily.     No current facility-administered medications for this visit.    Objective: BP 128/72 mmHg  Pulse 71  Temp(Src) 98.2 F (36.8 C)  Wt 150 lb (68.04 kg) Gen: NAD, resting comfortably CV: RRR no murmurs rubs or gallops Lungs: CTAB no crackles, wheeze, rhonchi Abdomen: soft/nontender/nondistended/normal bowel sounds.  Ext: no edema Skin: warm, dry, no rash- some varicose veins noted Neuro: grossly normal, moves all extremities   Assessment/Plan:  Hyperlipidemia No rx statin per patient. Willing to trial red yeast rice. She will have LDL checked before follow up visit.    Hypertension Well controlled. Continue current meds: Hctz, 25mg , metoprolol 25 mg XL Stopped amlodipine which was not planned but seems to control BP better so continue current rx. Consider low dose amlodipine as next step     Asthma, moderate persistent, well-controlled 2016- new diagnosis asthma per [place] allergy/asthma. Symptoms much improved on singulair and symbicort and well controlled and continue current rx.     6 month follow up with labs before visit Orders Placed This Encounter  Procedures  . Basic metabolic panel      Standing Status: Future     Number of Occurrences:      Standing Expiration Date: 10/23/2015  . LDL cholesterol, direct    [facility]    Standing Status: Future     Number of Occurrences:      Standing Expiration Date: 10/23/2015   Added by [place] allergy Meds ordered this encounter  Medications  . montelukast (SINGULAIR) 10 MG tablet    Sig:   . fluticasone (FLONASE) 50 MCG/ACT nasal spray    Sig:    . SYMBICORT 160-4.5 MCG/ACT inhaler    Sig:

## 2014-11-05 MED ORDER — HYDROCHLOROTHIAZIDE 25 MG PO TABS
25.0000 mg | ORAL_TABLET | Freq: Every day | ORAL | Status: DC
Start: 2014-11-05 — End: 2015-05-01

## 2014-11-05 MED ORDER — HYDROCHLOROTHIAZIDE 25 MG PO TABS: 25.0000 mg | ORAL_TABLET | Freq: Every day | ORAL | Status: DC

## 2014-11-05 NOTE — Addendum Note (Signed)
Addended by: Clyde Lundborg A on: 11/05/2014 10:23 AM   Modules accepted: Orders

## 2014-11-05 NOTE — Addendum Note (Signed)
Addended by: Clyde Lundborg A on: 11/05/2014 10:25 AM   Modules accepted: Orders

## 2015-01-02 ENCOUNTER — Other Ambulatory Visit: Payer: Self-pay | Admitting: Family Medicine

## 2015-01-14 ENCOUNTER — Encounter: Payer: Self-pay | Admitting: Family Medicine

## 2015-01-14 ENCOUNTER — Ambulatory Visit (INDEPENDENT_AMBULATORY_CARE_PROVIDER_SITE_OTHER): Payer: Medicare HMO | Admitting: Family Medicine

## 2015-01-14 VITALS — BP 140/70 | HR 75 | Temp 98.8°F | Wt 152.0 lb

## 2015-01-14 DIAGNOSIS — I4902 Ventricular flutter: Secondary | ICD-10-CM

## 2015-01-14 DIAGNOSIS — I10 Essential (primary) hypertension: Secondary | ICD-10-CM | POA: Diagnosis not present

## 2015-01-14 DIAGNOSIS — I498 Other specified cardiac arrhythmias: Secondary | ICD-10-CM

## 2015-01-14 MED ORDER — AMLODIPINE BESYLATE 10 MG PO TABS: 10.0000 mg | ORAL_TABLET | Freq: Every day | ORAL | Status: DC

## 2015-01-14 NOTE — Assessment & Plan Note (Addendum)
S: Blood pressure up to 170/90 at home last night on Hctz 25mg , metoprolol 25 mg XL. She had an old amlodipine and took that. Improved BP this morning. Has noted BP slowly creeping up in recent weeks. She states last night when blood pressure was up had short term palpitations which improved with improved control A/P: we will add back on amlodipine 10mg  (titrate to this per AVS). Continue Hctz 25mg , metoprolol 25 mg XL. Close follow up per avs

## 2015-01-14 NOTE — Progress Notes (Signed)
Garret Reddish, MD  Subjective:  Lisa Greene is a 74 y.o. year old very pleasant female patient who presents with:  See problem oriented charting ROS- no chest pain, blurry vision, shortness of breath, LE edema  Past Medical History- HTN, HLD, asthma, osteopenia, allergic rhinitis  Medications- reviewed and updated Current Outpatient Prescriptions  Medication Sig Dispense Refill  . cholecalciferol (VITAMIN D) 1000 UNITS tablet Take 2,000 Units by mouth daily.    . fluticasone (FLONASE) 50 MCG/ACT nasal spray     . hydrochlorothiazide (HYDRODIURIL) 25 MG tablet Take 1 tablet (25 mg total) by mouth daily. 90 tablet 1  . metoprolol succinate (TOPROL-XL) 25 MG 24 hr tablet TAKE 1 TABLET (25 MG TOTAL) BY MOUTH DAILY. 90 tablet 0  . mometasone (NASONEX) 50 MCG/ACT nasal spray INSTILL 2 SPRAYS IN THE NOSE EVERY DAY 17 g 1  . montelukast (SINGULAIR) 10 MG tablet     . SYMBICORT 160-4.5 MCG/ACT inhaler     . Albuterol Sulfate (PROAIR RESPICLICK) 440 (90 BASE) MCG/ACT AEPB Inhale 1 puff into the lungs every 8 (eight) hours as needed. (Patient not taking: Reported on 10/23/2014) 1 each 0  . amLODipine (NORVASC) 10 MG tablet Take 1 tablet (10 mg total) by mouth daily. 90 tablet 3  . [DISCONTINUED] albuterol (PROVENTIL) 90 MCG/ACT inhaler Inhale 2 puffs into the lungs every 6 (six) hours as needed for wheezing. 17 g 0  . [DISCONTINUED] pantoprazole (PROTONIX) 40 MG tablet Take 1 tablet by mouth daily.     No current facility-administered medications for this visit.    Objective: BP 140/70 mmHg  Pulse 75  Temp(Src) 98.8 F (37.1 C)  Wt 152 lb (68.947 kg)  My repeat 148/80 Gen: NAD, resting comfortably CV: RRR no murmurs rubs or gallops Lungs: CTAB no crackles, wheeze, rhonchi Abdomen: soft/nontender/nondistended/normal bowel sounds.  Ext: no edema Skin: warm, dry, no rash  Neuro: grossly normal, moves all extremities   EKG: Sinus rhythm, rate 70, normal axis, normal intervals, no  hypertrophy, no st or t wave changes except flattening at III (similar from 6/27/012 except from this flattening)   Assessment/Plan:  Hypertension S: Blood pressure up to 170/90 at home last night on Hctz 25mg , metoprolol 25 mg XL. Poor control She had an old amlodipine and took that. Improved BP this morning. Has noted BP slowly creeping up in recent weeks. She states last night when blood pressure was up had short term palpitations which improved with improved control A/P: we will add back on amlodipine 10mg  (titrate to this per AVS). Continue Hctz 25mg , metoprolol 25 mg XL. Close follow up per avs  EKG was done due to palpitations with BP elevation and no significant ischemic changes. We discussed if had recurrent issues, could consider cardiac monitoring or cards referral but 1x episode at this point.   Return precautions advised. Patient to call next Thursday to give BP update. 3 week in person visit.   Orders Placed This Encounter  Procedures  . EKG 12-Lead    Meds ordered this encounter  Medications  . amLODipine (NORVASC) 10 MG tablet    Sig: Take 1 tablet (10 mg total) by mouth daily.    Dispense:  90 tablet    Refill:  3

## 2015-01-14 NOTE — Patient Instructions (Signed)
Medication Instructions:  Continue metoprolol AND Hydrochlorothiazide  ALSO add amlodipine. Take 1/2 tab for 3 days and if blood pressures still above 140/90 increase to full tab on day 4.   Other Instructions:  Call and update K[facility] with your #s over week next Thursday.   Follow-Up (all visit scheduling, rescheduling, cancellations including labs should be scheduled at front desk): Schedule a vsit for about 3 weeks in person- if fluttering feeling persists with controlling blood pressure, may need to do more investigation

## 2015-01-15 ENCOUNTER — Ambulatory Visit: Payer: Medicare HMO | Admitting: Family Medicine

## 2015-01-24 ENCOUNTER — Telehealth: Payer: Self-pay | Admitting: Family Medicine

## 2015-01-24 NOTE — Telephone Encounter (Signed)
Lets go to full tab and try to keep all top #s below 140.

## 2015-01-24 NOTE — Telephone Encounter (Signed)
Pt is taking half a tab of amlodipine 5mg .

## 2015-01-24 NOTE — Telephone Encounter (Signed)
She is on a full tab of amlodipine 10mg  now correct? If so, would continue on current dose with in person check within a month

## 2015-01-24 NOTE — Telephone Encounter (Signed)
See below

## 2015-01-24 NOTE — Telephone Encounter (Signed)
Pt is calling with bp results. 8/16   144/68, 8/17 143/60, 8/18 137/67, 8/19 134/64, 8/20 133/63, 8/21 135/68, 8/22 140/65 .

## 2015-01-25 NOTE — Telephone Encounter (Signed)
Pt.notified

## 2015-02-12 ENCOUNTER — Encounter: Payer: Self-pay | Admitting: Family Medicine

## 2015-02-12 ENCOUNTER — Ambulatory Visit (INDEPENDENT_AMBULATORY_CARE_PROVIDER_SITE_OTHER): Payer: Medicare HMO | Admitting: Family Medicine

## 2015-02-12 VITALS — BP 140/56 | HR 82 | Temp 98.6°F | Wt 153.0 lb

## 2015-02-12 DIAGNOSIS — I10 Essential (primary) hypertension: Secondary | ICD-10-CM

## 2015-02-12 MED ORDER — LISINOPRIL 20 MG PO TABS: 20.0000 mg | ORAL_TABLET | Freq: Every day | ORAL | Status: DC

## 2015-02-12 NOTE — Patient Instructions (Signed)
Stop amlodipine since it is causing swelling Start lisinopril 20mg  Come back for labs in 2 weeks  Keep monitoring at home. As long as <150/90 can see me back in 1 month but if it is above this then call me and I will increase your dose of lisinopril to 40mg  before you see me back for an in office check

## 2015-02-12 NOTE — Assessment & Plan Note (Signed)
S: controlled at home with no blood pressure over 140. Mild poor control today. Unfortunately edema on amlodipine BP Readings from Last 3 Encounters:  02/12/15 140/56  01/14/15 140/70  10/23/14 128/72  A/P:Continue current meds:  HCTZ and metoprolol. Stop amlodipine. Start lisinopril 20 mg. Creatinine in 2 weeks. Follow-up in 1-2 months as long as blood pressure less than 150/90 at home

## 2015-02-12 NOTE — Progress Notes (Signed)
Garret Reddish, MD  Subjective:  Lisa Greene is a 74 y.o. year old very pleasant female patient who presents for/with See problem oriented charting ROS- no chest pain or shortness of breath. No headaches or blurry bruising. Does complain of edema at ankles  Past Medical History-  Patient Active Problem List   Diagnosis Date Noted  . Asthma, moderate persistent, well-controlled 04/19/2014    Priority: Medium  . Hypertension 11/25/2010    Priority: Medium  . Hyperlipidemia 12/20/2006    Priority: Medium  . Eosinophilia 02/08/2009    Priority: Low  . Allergic rhinitis 01/31/2008    Priority: Low  . Osteopenia 12/20/2006    Priority: Low    Medications- reviewed and updated Current Outpatient Prescriptions  Medication Sig Dispense Refill  . cholecalciferol (VITAMIN D) 1000 UNITS tablet Take 2,000 Units by mouth daily.    . fluticasone (FLONASE) 50 MCG/ACT nasal spray     . hydrochlorothiazide (HYDRODIURIL) 25 MG tablet Take 1 tablet (25 mg total) by mouth daily. 90 tablet 1  . metoprolol succinate (TOPROL-XL) 25 MG 24 hr tablet TAKE 1 TABLET (25 MG TOTAL) BY MOUTH DAILY. 90 tablet 0  . mometasone (NASONEX) 50 MCG/ACT nasal spray INSTILL 2 SPRAYS IN THE NOSE EVERY DAY 17 g 1  . montelukast (SINGULAIR) 10 MG tablet     . SYMBICORT 160-4.5 MCG/ACT inhaler     . Albuterol Sulfate (PROAIR RESPICLICK) 122 (90 BASE) MCG/ACT AEPB Inhale 1 puff into the lungs every 8 (eight) hours as needed. (Patient not taking: Reported on 02/12/2015) 1 each 0  . lisinopril (PRINIVIL,ZESTRIL) 20 MG tablet Take 1 tablet (20 mg total) by mouth daily. 30 tablet 3  . [DISCONTINUED] albuterol (PROVENTIL) 90 MCG/ACT inhaler Inhale 2 puffs into the lungs every 6 (six) hours as needed for wheezing. 17 g 0  . [DISCONTINUED] pantoprazole (PROTONIX) 40 MG tablet Take 1 tablet by mouth daily.     No current facility-administered medications for this visit.    Objective: BP 140/56 mmHg  Pulse 82  Temp(Src) 98.6  F (37 C)  Wt 153 lb (69.4 kg) Gen: NAD, resting comfortably CV: RRR no murmurs rubs or gallops Lungs: CTAB no crackles, wheeze, rhonchi Abdomen: soft/nontender/nondistended/normal bowel sounds. No rebound or guarding.  Ext: Trace edema Skin: warm, dry Neuro: grossly normal, moves all extremities  Assessment/Plan:  Hypertension S: controlled at home with no blood pressure over 140. Mild poor control today. Unfortunately edema on amlodipine BP Readings from Last 3 Encounters:  02/12/15 140/56  01/14/15 140/70  10/23/14 128/72  A/P:Continue current meds:  HCTZ and metoprolol. Stop amlodipine. Start lisinopril 20 mg. Creatinine in 2 weeks. Follow-up in 1-2 months as long as blood pressure less than 150/90 at home   Return precautions advised.   Meds ordered this encounter  Medications  . lisinopril (PRINIVIL,ZESTRIL) 20 MG tablet    Sig: Take 1 tablet (20 mg total) by mouth daily.    Dispense:  30 tablet    Refill:  3

## 2015-02-19 ENCOUNTER — Other Ambulatory Visit: Payer: Self-pay | Admitting: Family Medicine

## 2015-02-19 ENCOUNTER — Telehealth: Payer: Self-pay | Admitting: Family Medicine

## 2015-02-19 DIAGNOSIS — I10 Essential (primary) hypertension: Secondary | ICD-10-CM

## 2015-02-19 NOTE — Telephone Encounter (Signed)
Pt scheduled  

## 2015-02-19 NOTE — Telephone Encounter (Signed)
Lab entered, ok to schedule.

## 2015-02-19 NOTE — Telephone Encounter (Signed)
Lm with pt hus °

## 2015-02-19 NOTE — Telephone Encounter (Signed)
Pt was seen on 02-12-15 and was told to make lab appt in 2 wks. What labs ?

## 2015-02-25 ENCOUNTER — Other Ambulatory Visit (INDEPENDENT_AMBULATORY_CARE_PROVIDER_SITE_OTHER): Payer: Medicare HMO

## 2015-02-25 DIAGNOSIS — E785 Hyperlipidemia, unspecified: Secondary | ICD-10-CM | POA: Diagnosis not present

## 2015-02-25 DIAGNOSIS — I10 Essential (primary) hypertension: Secondary | ICD-10-CM

## 2015-02-25 LAB — BASIC METABOLIC PANEL
BUN: 17 mg/dL (ref 6–23)
CO2: 26 meq/L (ref 19–32)
CREATININE: 1.22 mg/dL — AB (ref 0.40–1.20)
Calcium: 9.5 mg/dL (ref 8.4–10.5)
Chloride: 102 mEq/L (ref 96–112)
GFR: 45.72 mL/min — ABNORMAL LOW (ref >60.00)
Glucose, Bld: 198 mg/dL — ABNORMAL HIGH (ref 70–99)
Potassium: 4.6 mEq/L (ref 3.5–5.1)
SODIUM: 139 meq/L (ref 135–145)

## 2015-02-25 LAB — LDL CHOLESTEROL, DIRECT: Direct LDL: 134 mg/dL

## 2015-03-01 ENCOUNTER — Encounter: Payer: Self-pay | Admitting: Family Medicine

## 2015-03-07 ENCOUNTER — Telehealth: Payer: Self-pay | Admitting: Family Medicine

## 2015-03-07 NOTE — Telephone Encounter (Signed)
Patient Name: Lisa Greene DOB: 1941-05-21 Initial Comment Caller States she is getting itching and stinging that appears in different parts of the body. she was just put on a new BP med 3 wks ago. Nurse Assessment Nurse: Marcelline Deist, RN, Lynda Date/Time (Eastern Time): 03/07/2015 9:17:27 AM Confirm and document reason for call. If symptomatic, describe symptoms. ---Caller states she is getting itching and stinging that appears in different parts of the body. She was just put on a new BP medication 3 wks. ago. No rash is apparent. Not sure of the rx name. The episodes can last minutes up to an hour. Has used anti itch lotion on different areas. Has the patient traveled out of the country within the last 30 days? ---Not Applicable Does the patient have any new or worsening symptoms? ---Yes Will a triage be completed? ---Yes Related visit to physician within the last 2 weeks? ---No Does the PT have any chronic conditions? (i.e. diabetes, asthma, etc.) ---Yes List chronic conditions. ---BP rx, asthma Guidelines Guideline Title Affirmed Question Affirmed Notes Itching - Widespread [1] MODERATE-SEVERE widespread itching (i.e., interferes with sleep, normal activities or school) AND [2] not improved after 24 hours of itching Care Advice Final Disposition User See Physician within [place], RN, Lynda Referrals REFERRED TO PCP OFFICE Disagree/Comply: Comply

## 2015-03-07 NOTE — Telephone Encounter (Signed)
Patient scheduled with Dr. Yong Channel tomorrow

## 2015-03-08 ENCOUNTER — Encounter: Payer: Self-pay | Admitting: Family Medicine

## 2015-03-08 ENCOUNTER — Ambulatory Visit (INDEPENDENT_AMBULATORY_CARE_PROVIDER_SITE_OTHER): Payer: Medicare HMO | Admitting: Family Medicine

## 2015-03-08 VITALS — BP 122/70 | HR 70 | Temp 98.4°F | Wt 152.0 lb

## 2015-03-08 DIAGNOSIS — Z8249 Family history of ischemic heart disease and other diseases of the circulatory system: Secondary | ICD-10-CM | POA: Diagnosis not present

## 2015-03-08 DIAGNOSIS — I10 Essential (primary) hypertension: Secondary | ICD-10-CM | POA: Diagnosis not present

## 2015-03-08 MED ORDER — LOSARTAN POTASSIUM 50 MG PO TABS: 50.0000 mg | ORAL_TABLET | Freq: Every day | ORAL | Status: DC

## 2015-03-08 NOTE — Assessment & Plan Note (Signed)
S: Blood pressure has done very well with lisinopril addition staying in 120s or 665L and diastolic always controlled. Started medicine about a month ago but recently within a few days has noted Stinging spots on skin - random spots all over body. Worse right after she takess lisinopril. She also in last few weeks has developed a dry cough.  A/P: we will continue hctz 25mg  and metoprolol 25mg  XL. Change lisinopril 20mg  to losartan 50mg . Odd side effect- stinging spots but there is no visual element to this such as hives so doubt that as cause. No angioedema. Patient will see me back in in November or sooner if other side effects. Lisa Greene

## 2015-03-08 NOTE — Patient Instructions (Signed)
Due to side effects , stop lisinopril  Start losartan instead   Follow up in November unless continued symptoms

## 2015-03-08 NOTE — Progress Notes (Signed)
Garret Reddish, MD  Subjective:  Lisa Greene is a 74 y.o. year old very pleasant female patient who presents for/with See problem oriented charting ROS- no chest pain, shortness of breath, blurry vision, lip or tongue swelling, no hives. Occasionally mild red spot in area that stings  Past Medical History-  Patient Active Problem List   Diagnosis Date Noted  . Asthma, moderate persistent, well-controlled 04/19/2014    Priority: Medium  . Hypertension 11/25/2010    Priority: Medium  . Hyperlipidemia 12/20/2006    Priority: Medium  . Family history of abdominal aortic aneurysm 03/08/2015    Priority: Low  . Eosinophilia 02/08/2009    Priority: Low  . Allergic rhinitis 01/31/2008    Priority: Low  . Osteopenia 12/20/2006    Priority: Low    Medications- reviewed and updated Current Outpatient Prescriptions  Medication Sig Dispense Refill  . cholecalciferol (VITAMIN D) 1000 UNITS tablet Take 2,000 Units by mouth daily.    . fluticasone (FLONASE) 50 MCG/ACT nasal spray     . hydrochlorothiazide (HYDRODIURIL) 25 MG tablet Take 1 tablet (25 mg total) by mouth daily. 90 tablet 1  . metoprolol succinate (TOPROL-XL) 25 MG 24 hr tablet TAKE 1 TABLET (25 MG TOTAL) BY MOUTH DAILY. 90 tablet 0  . mometasone (NASONEX) 50 MCG/ACT nasal spray INSTILL 2 SPRAYS IN THE NOSE EVERY DAY 17 g 1  . montelukast (SINGULAIR) 10 MG tablet     . SYMBICORT 160-4.5 MCG/ACT inhaler     . Albuterol Sulfate (PROAIR RESPICLICK) 856 (90 BASE) MCG/ACT AEPB Inhale 1 puff into the lungs every 8 (eight) hours as needed. (Patient not taking: Reported on 02/12/2015) 1 each 0  . losartan (COZAAR) 50 MG tablet Take 1 tablet (50 mg total) by mouth daily. 30 tablet 3  . [DISCONTINUED] albuterol (PROVENTIL) 90 MCG/ACT inhaler Inhale 2 puffs into the lungs every 6 (six) hours as needed for wheezing. 17 g 0  . [DISCONTINUED] pantoprazole (PROTONIX) 40 MG tablet Take 1 tablet by mouth daily.     No current  facility-administered medications for this visit.    Objective: BP 122/70 mmHg  Pulse 70  Temp(Src) 98.4 F (36.9 C)  Wt 152 lb (68.947 kg) Gen: NAD, resting comfortably CV: RRR no murmurs rubs or gallops Lungs: CTAB no crackles, wheeze, rhonchi Abdomen: soft/nontender/nondistended/normal bowel sounds. No rebound or guarding.  Ext: no edema Skin: warm, dry, no visualized lesions in areas she points to stinging Neuro: grossly normal, moves all extremities  Assessment/Plan:  Hypertension S: Blood pressure has done very well with lisinopril addition staying in 120s or 314H and diastolic always controlled. Started medicine about a month ago but recently within a few days has noted Stinging spots on skin - random spots all over body. Worse right after she takess lisinopril. She also in last few weeks has developed a dry cough.  A/P: we will continue hctz 25mg  and metoprolol 25mg  XL. Change lisinopril 20mg  to losartan 50mg . Odd side effect- stinging spots but there is no visual element to this such as hives so doubt that as cause. No angioedema. Patient will see me back in in November or sooner if other side effects. .    Sooner Return precautions advised.   Meds ordered this encounter  Medications  . losartan (COZAAR) 50 MG tablet    Sig: Take 1 tablet (50 mg total) by mouth daily.    Dispense:  30 tablet    Refill:  3

## 2015-03-13 ENCOUNTER — Ambulatory Visit: Payer: Medicare HMO | Admitting: Family Medicine

## 2015-03-18 ENCOUNTER — Ambulatory Visit: Payer: Medicare HMO | Admitting: Family Medicine

## 2015-03-19 DIAGNOSIS — R69 Illness, unspecified: Secondary | ICD-10-CM | POA: Diagnosis not present

## 2015-03-20 ENCOUNTER — Ambulatory Visit: Payer: Medicare HMO | Admitting: Family Medicine

## 2015-03-28 DIAGNOSIS — J4520 Mild intermittent asthma, uncomplicated: Secondary | ICD-10-CM | POA: Diagnosis not present

## 2015-03-28 DIAGNOSIS — I10 Essential (primary) hypertension: Secondary | ICD-10-CM | POA: Diagnosis not present

## 2015-04-01 ENCOUNTER — Other Ambulatory Visit: Payer: Self-pay | Admitting: Family Medicine

## 2015-04-10 DIAGNOSIS — H1045 Other chronic allergic conjunctivitis: Secondary | ICD-10-CM | POA: Diagnosis not present

## 2015-04-10 DIAGNOSIS — J4540 Moderate persistent asthma, uncomplicated: Secondary | ICD-10-CM | POA: Diagnosis not present

## 2015-04-10 DIAGNOSIS — J300 Vasomotor rhinitis: Secondary | ICD-10-CM | POA: Diagnosis not present

## 2015-04-12 ENCOUNTER — Other Ambulatory Visit (INDEPENDENT_AMBULATORY_CARE_PROVIDER_SITE_OTHER): Payer: Medicare HMO

## 2015-04-12 DIAGNOSIS — I10 Essential (primary) hypertension: Secondary | ICD-10-CM

## 2015-04-12 LAB — BASIC METABOLIC PANEL
BUN: 12 mg/dL (ref 6–23)
CALCIUM: 9.3 mg/dL (ref 8.4–10.5)
CO2: 28 meq/L (ref 19–32)
Chloride: 99 mEq/L (ref 96–112)
Creatinine, Ser: 1.06 mg/dL (ref 0.40–1.20)
GFR: 53.75 mL/min — ABNORMAL LOW (ref >60.00)
GLUCOSE: 103 mg/dL — AB (ref 70–99)
POTASSIUM: 4 meq/L (ref 3.5–5.1)
SODIUM: 136 meq/L (ref 135–145)

## 2015-04-19 ENCOUNTER — Encounter: Payer: Self-pay | Admitting: Family Medicine

## 2015-04-19 ENCOUNTER — Ambulatory Visit (INDEPENDENT_AMBULATORY_CARE_PROVIDER_SITE_OTHER): Payer: Medicare HMO | Admitting: Family Medicine

## 2015-04-19 VITALS — BP 124/52 | HR 71 | Temp 98.9°F | Wt 152.0 lb

## 2015-04-19 DIAGNOSIS — E785 Hyperlipidemia, unspecified: Secondary | ICD-10-CM | POA: Diagnosis not present

## 2015-04-19 DIAGNOSIS — I10 Essential (primary) hypertension: Secondary | ICD-10-CM

## 2015-04-19 DIAGNOSIS — M8580 Other specified disorders of bone density and structure, unspecified site: Secondary | ICD-10-CM

## 2015-04-19 NOTE — Patient Instructions (Addendum)
No changes to medicines today Thankfully things seem to have settled down and blood pressure looks great!   Physical in 6 months.  A week before- Schedule a lab visit at the front desk. Return for future fasting labs. Nothing but water after midnight please.   Schedule your bone density test at front desk

## 2015-04-19 NOTE — Progress Notes (Signed)
Garret Reddish, MD  Subjective:  Lisa Greene is a 74 y.o. year old very pleasant female patient who presents for/with See problem oriented charting ROS- No chest pain or shortness of breath. No headache or blurry vision.  No myalgias.   Past Medical History-  Patient Active Problem List   Diagnosis Date Noted  . Asthma, moderate persistent, well-controlled 04/19/2014    Priority: Medium  . Hypertension 11/25/2010    Priority: Medium  . Hyperlipidemia 12/20/2006    Priority: Medium  . Family history of abdominal aortic aneurysm 03/08/2015    Priority: Low  . Eosinophilia 02/08/2009    Priority: Low  . Allergic rhinitis 01/31/2008    Priority: Low  . Osteopenia 12/20/2006    Priority: Low    Medications- reviewed and updated Current Outpatient Prescriptions  Medication Sig Dispense Refill  . cholecalciferol (VITAMIN D) 1000 UNITS tablet Take 2,000 Units by mouth daily.    . fluticasone (FLONASE) 50 MCG/ACT nasal spray     . hydrochlorothiazide (HYDRODIURIL) 25 MG tablet Take 1 tablet (25 mg total) by mouth daily. 90 tablet 1  . losartan (COZAAR) 50 MG tablet Take 1 tablet (50 mg total) by mouth daily. 30 tablet 3  . metoprolol succinate (TOPROL-XL) 25 MG 24 hr tablet TAKE 1 TABLET (25 MG TOTAL) BY MOUTH DAILY. 90 tablet 3  . montelukast (SINGULAIR) 10 MG tablet     . SYMBICORT 160-4.5 MCG/ACT inhaler     . Albuterol Sulfate (PROAIR RESPICLICK) 123XX123 (90 BASE) MCG/ACT AEPB Inhale 1 puff into the lungs every 8 (eight) hours as needed. (Patient not taking: Reported on 02/12/2015) 1 each 0  . [DISCONTINUED] albuterol (PROVENTIL) 90 MCG/ACT inhaler Inhale 2 puffs into the lungs every 6 (six) hours as needed for wheezing. 17 g 0  . [DISCONTINUED] pantoprazole (PROTONIX) 40 MG tablet Take 1 tablet by mouth daily.     No current facility-administered medications for this visit.    Objective: BP 124/52 mmHg  Pulse 71  Temp(Src) 98.9 F (37.2 C)  Wt 152 lb (68.947 kg) Gen: NAD,  resting comfortably CV: RRR no murmurs rubs or gallops Lungs: CTAB no crackles, wheeze, rhonchi Abdomen: soft/nontender/nondistended/normal bowel sounds. No rebound or guarding.  Ext: no edema (had edema on amlodipine) Skin: warm, dry Neuro: grossly normal, moves all extremities  Assessment/Plan:  Hypertension S: controlled. On  Hctz 25mg , metoprolol 25 mg XL, losartan 50mg . Finally without SE on this combination BP Readings from Last 3 Encounters:  04/19/15 124/52  03/08/15 122/70  02/12/15 140/56  A/P:Continue current meds:  Doing well   Osteopenia S: compliant with Vitamin D, last DEXA about 5 years ago A/P: update DEXA at this time   Hyperlipidemia S: improved control with nearly 30 point drop of LDL on red yeast rice 2 capules daily A/P: No rx statin per patient. Doing well with red yeast rice. Patient opts against statin given age near 59 and no CVA/MI.    Return precautions advised.   6 month CPE with labs before visit Orders Placed This Encounter  Procedures  . DG Bone Density    Standing Status: Future     Number of Occurrences:      Standing Expiration Date: 06/18/2016    Order Specific Question:  Reason for Exam (SYMPTOM  OR DIAGNOSIS REQUIRED)    Answer:  osteopenia follow up    Order Specific Question:  Preferred imaging location?    Answer:  [facility]  . CBC    Standing Status:  Future     Number of Occurrences:      Standing Expiration Date: 04/18/2016  . Comprehensive metabolic panel    Standing Status: Future     Number of Occurrences:      Standing Expiration Date: 04/18/2016  . Lipid panel    Standing Status: Future     Number of Occurrences:      Standing Expiration Date: 04/18/2016

## 2015-04-20 NOTE — Assessment & Plan Note (Signed)
S: controlled. On  Hctz 25mg , metoprolol 25 mg XL, losartan 50mg . Finally without SE on this combination BP Readings from Last 3 Encounters:  04/19/15 124/52  03/08/15 122/70  02/12/15 140/56  A/P:Continue current meds:  Doing well

## 2015-04-20 NOTE — Assessment & Plan Note (Signed)
S: compliant with Vitamin D, last DEXA about 5 years ago A/P: update DEXA at this time

## 2015-04-20 NOTE — Assessment & Plan Note (Signed)
S: improved control with nearly 30 point drop of LDL on red yeast rice 2 capules daily A/P: No rx statin per patient. Doing well with red yeast rice. Patient opts against statin given age near 56 and no CVA/MI.

## 2015-04-23 ENCOUNTER — Ambulatory Visit: Payer: Medicare HMO | Admitting: Family Medicine

## 2015-05-01 ENCOUNTER — Other Ambulatory Visit: Payer: Self-pay | Admitting: Family Medicine

## 2015-06-05 ENCOUNTER — Telehealth: Payer: Self-pay | Admitting: Family Medicine

## 2015-06-05 NOTE — Telephone Encounter (Signed)
Used SDA 06/06/15 11:15 FOR DIZZYNESS

## 2015-06-06 ENCOUNTER — Ambulatory Visit (INDEPENDENT_AMBULATORY_CARE_PROVIDER_SITE_OTHER): Payer: Medicare HMO | Admitting: Family Medicine

## 2015-06-06 ENCOUNTER — Encounter: Payer: Self-pay | Admitting: Family Medicine

## 2015-06-06 VITALS — BP 122/64 | HR 70 | Temp 98.9°F | Wt 150.0 lb

## 2015-06-06 DIAGNOSIS — H8112 Benign paroxysmal vertigo, left ear: Secondary | ICD-10-CM | POA: Diagnosis not present

## 2015-06-06 DIAGNOSIS — Z23 Encounter for immunization: Secondary | ICD-10-CM

## 2015-06-06 MED ORDER — MECLIZINE HCL 25 MG PO TABS: 25.0000 mg | ORAL_TABLET | Freq: Three times a day (TID) | ORAL | Status: DC | PRN

## 2015-06-06 NOTE — Progress Notes (Signed)
Garret Reddish, MD  Subjective:  Lisa Greene is a 75 y.o. year old very pleasant female patient who presents for/with See problem oriented charting ROS- see HPI - included no palpitations, hearing loss, tinnitus, chest pain, shortness of breath  Past Medical History-  Patient Active Problem List   Diagnosis Date Noted  . Asthma, moderate persistent, well-controlled 04/19/2014    Priority: Medium  . Hypertension 11/25/2010    Priority: Medium  . Hyperlipidemia 12/20/2006    Priority: Medium  . Family history of abdominal aortic aneurysm 03/08/2015    Priority: Low  . Eosinophilia 02/08/2009    Priority: Low  . Allergic rhinitis 01/31/2008    Priority: Low  . Osteopenia 12/20/2006    Priority: Low    Medications- reviewed and updated Current Outpatient Prescriptions  Medication Sig Dispense Refill  . cholecalciferol (VITAMIN D) 1000 UNITS tablet Take 2,000 Units by mouth daily.    . fluticasone (FLONASE) 50 MCG/ACT nasal spray     . hydrochlorothiazide (HYDRODIURIL) 25 MG tablet TAKE 1 TABLET (25 MG TOTAL) BY MOUTH DAILY. 90 tablet 3  . losartan (COZAAR) 50 MG tablet Take 1 tablet (50 mg total) by mouth daily. 30 tablet 3  . metoprolol succinate (TOPROL-XL) 25 MG 24 hr tablet TAKE 1 TABLET (25 MG TOTAL) BY MOUTH DAILY. 90 tablet 3  . montelukast (SINGULAIR) 10 MG tablet     . SYMBICORT 160-4.5 MCG/ACT inhaler     . Albuterol Sulfate (PROAIR RESPICLICK) 123XX123 (90 BASE) MCG/ACT AEPB Inhale 1 puff into the lungs every 8 (eight) hours as needed. (Patient not taking: Reported on 02/12/2015) 1 each 0  . [DISCONTINUED] albuterol (PROVENTIL) 90 MCG/ACT inhaler Inhale 2 puffs into the lungs every 6 (six) hours as needed for wheezing. 17 g 0  . [DISCONTINUED] pantoprazole (PROTONIX) 40 MG tablet Take 1 tablet by mouth daily.     No current facility-administered medications for this visit.    Objective: BP 122/64 mmHg  Pulse 70  Temp(Src) 98.9 F (37.2 C)  Wt 150 lb (68.04  kg) Gen: NAD, resting comfortably CV: RRR no murmurs rubs or gallops Lungs: CTAB no crackles, wheeze, rhonchi Abdomen: soft/nontender/nondistended/normal bowel sounds. No rebound or guarding.  Ext: no edema Skin: warm, dry Neuro: CN II-XII intact, sensation and reflexes normal throughout, 5/5 muscle strength in bilateral upper and lower extremities. Normal finger to nose. Normal rapid alternating movements. No pronator drift. Normal romberg. Normal gait.   Patient does get dizzy with head thrust. Did not perform epley.    Assessment/Plan:  Dizziness/lightheadedness S: started 2 days ago when she woke up. Seems to come and go. 5-6x a day lasts for a few minutes. Sometimes with standing, sometimes not. History of inner ear problems in past and antihistamines helped. No falls. No falling to one side or other. Mild ear pain in left ear. No hearing loss. Sinuses feel full. Some sinus drainage- taking symbicort and zyrtec. No chest pain, palpitations, shortness of breath. No hearing loss. Symptoms seem to be worse on turning head to left  She has had a history of similar symptoms in the past and usually responded to meclizine. Thought to be BPPV. Used to be yearly but has not had issues in about 10 years.   A/P: Likely BPPV though not with classic room spinning. Neuro exam reassuring, doubt CVA TIA. No symptoms to suggest arrhythmia and RRR on exam even when dizzy. Meniere's possible but no tinnitus or hearing loss. Labyrinthitis possible but would think less likely  to come and go. We discussed reasons for return/red flags. Sympomatic treatment with meclizine which has helped in past. Also given epley maneuvers for therapy at home.   Orders Placed This Encounter  Procedures  . Pneumococcal conjugate vaccine 13-valent

## 2015-06-06 NOTE — Patient Instructions (Addendum)
Final pneumonia shot [id number]) received today.  If you note any hearing loss please let us know immediately or if symptoms worsen. Return if not improving within 2 weeks.   May use meclizine to help with symptoms.   Benign Positional Vertigo Vertigo is the feeling that you or your surroundings are moving when they are not. Benign positional vertigo is the most common form of vertigo. The cause of this condition is not serious (is benign). This condition is triggered by certain movements and positions (is positional). This condition can be dangerous if it occurs while you are doing something that could endanger you or others, such as driving.  CAUSES In many cases, the cause of this condition is not known. It may be caused by a disturbance in an area of the inner ear that helps your brain to sense movement and balance. This disturbance can be caused by a viral infection (labyrinthitis), head injury, or repetitive motion. RISK FACTORS This condition is more likely to develop in:  Women.  People who are 47 years of age or older. SYMPTOMS Symptoms of this condition usually happen when you move your head or your eyes in different directions. Symptoms may start suddenly, and they usually last for less than a minute. Symptoms may include:  Loss of balance and falling.  Feeling like you are spinning or moving.  Feeling like your surroundings are spinning or moving.  Nausea and vomiting.  Blurred vision.  Dizziness.  Involuntary eye movement (nystagmus). Symptoms can be mild and cause only slight annoyance, or they can be severe and interfere with daily life. Episodes of benign positional vertigo may return (recur) over time, and they may be triggered by certain movements. Symptoms may improve over time. DIAGNOSIS This condition is usually diagnosed by medical history and a physical exam of the head, neck, and ears. You may be referred to a health care provider who specializes in ear,  nose, and throat (ENT) problems (otolaryngologist) or a provider who specializes in disorders of the nervous system (neurologist). You may have additional testing, including:  MRI.  A CT scan.  Eye movement tests. Your health care provider may ask you to change positions quickly while he or she watches you for symptoms of benign positional vertigo, such as nystagmus. Eye movement may be tested with an electronystagmogram (ENG), caloric stimulation, the Dix-Hallpike test, or the roll test.  An electroencephalogram (EEG). This records electrical activity in your brain.  Hearing tests. TREATMENT Usually, your health care provider will treat this by moving your head in specific positions to adjust your inner ear back to normal. Surgery may be needed in severe cases, but this is rare. In some cases, benign positional vertigo may resolve on its own in 2-4 weeks. HOME CARE INSTRUCTIONS Safety  Move slowly.Avoid sudden body or head movements.  Avoid driving.  Avoid operating heavy machinery.  Avoid doing any tasks that would be dangerous to you or others if a vertigo episode would occur.  If you have trouble walking or keeping your balance, try using a cane for stability. If you feel dizzy or unstable, sit down right away.  Return to your normal activities as told by your health care provider. Ask your health care provider what activities are safe for you. General Instructions  Take over-the-counter and prescription medicines only as told by your health care provider.  Avoid certain positions or movements as told by your health care provider.  Drink enough fluid to keep your urine clear or pale  yellow.  Keep all follow-up visits as told by your health care provider. This is important. SEEK MEDICAL CARE IF:  You have a fever.  Your condition gets worse or you develop new symptoms.  Your family or friends notice any behavioral changes.  Your nausea or vomiting gets worse.  You  have numbness or a "pins and needles" sensation. SEEK IMMEDIATE MEDICAL CARE IF:  You have difficulty speaking or moving.  You are always dizzy.  You faint.  You develop severe headaches.  You have weakness in your legs or arms.  You have changes in your hearing or vision.  You develop a stiff neck.  You develop sensitivity to light.   This information is not intended to replace advice given to you by your health care provider. Make sure you discuss any questions you have with your health care provider.   Document Released: 02/23/2006 Document Revised: 02/06/2015 Document Reviewed: 09/10/2014 Elsevier Interactive Patient Education [employer].

## 2015-06-13 ENCOUNTER — Ambulatory Visit (INDEPENDENT_AMBULATORY_CARE_PROVIDER_SITE_OTHER)
Admission: RE | Admit: 2015-06-13 | Discharge: 2015-06-13 | Disposition: A | Discharge status: Home / Self Care | Payer: Medicare HMO | Source: Ambulatory Visit | Attending: Family Medicine | Admitting: Family Medicine

## 2015-06-13 DIAGNOSIS — M8580 Other specified disorders of bone density and structure, unspecified site: Secondary | ICD-10-CM | POA: Diagnosis not present

## 2015-06-18 ENCOUNTER — Encounter: Payer: Self-pay | Admitting: Family Medicine

## 2015-06-25 ENCOUNTER — Other Ambulatory Visit: Payer: Self-pay | Admitting: Dermatology

## 2015-06-25 DIAGNOSIS — D485 Neoplasm of uncertain behavior of skin: Secondary | ICD-10-CM | POA: Diagnosis not present

## 2015-06-25 DIAGNOSIS — L570 Actinic keratosis: Secondary | ICD-10-CM | POA: Diagnosis not present

## 2015-06-25 DIAGNOSIS — D2239 Melanocytic nevi of other parts of face: Secondary | ICD-10-CM | POA: Diagnosis not present

## 2015-07-02 ENCOUNTER — Other Ambulatory Visit: Payer: Self-pay | Admitting: Family Medicine

## 2015-07-16 ENCOUNTER — Ambulatory Visit (INDEPENDENT_AMBULATORY_CARE_PROVIDER_SITE_OTHER): Payer: Medicare HMO | Admitting: Family Medicine

## 2015-07-16 ENCOUNTER — Encounter: Payer: Self-pay | Admitting: Family Medicine

## 2015-07-16 VITALS — BP 132/70 | HR 77 | Temp 98.8°F | Wt 153.0 lb

## 2015-07-16 DIAGNOSIS — I10 Essential (primary) hypertension: Secondary | ICD-10-CM

## 2015-07-16 MED ORDER — LOSARTAN POTASSIUM 100 MG PO TABS: 100.0000 mg | ORAL_TABLET | Freq: Every day | ORAL | Status: DC

## 2015-07-16 NOTE — Patient Instructions (Signed)
Blood pressure higher than desired at home at least 2/3 of home readings over last week. Let's increase to 100mg  losartan and follow up in 1 month.   You have also had some heart racing feelings- please keep a journal of frequency. If increases more, let's see each other sooner. We may set you up with a heart monitor at next visit  Your blood pressure trend concerns me. I would like for you to use a home cuff to check at least daily. Your goal is <150/90 but under 140/90 even better. If you note in the next few weeks that it is higher than our goal, see me sooner. Otherwise, see me in 4 weeks. Bring your home cuff and your log of blood pressures with you to visit.

## 2015-07-16 NOTE — Assessment & Plan Note (Signed)
S: controlled in office with Hctz 25mg , metoprolol 25 mg XL, losartan 50mg . Home readings 67% of time above 150/90. Some over 140 as well. 1-2x a day feeling palpitations as well- that's what prompted her to restart checking. Only SE- occasional cramping but better in recent days BP Readings from Last 3 Encounters:  07/16/15 132/70  06/06/15 122/64  04/19/15 124/52  A/P:Continue current meds but increase losartan to 100mg  due to poor home control. HR and rhythm normal today- we discussed if persistent palpitations consider holter monitor for 24-48 hours. She will journal BP as well as palpitations.

## 2015-07-16 NOTE — Progress Notes (Signed)
Garret Reddish, MD  Subjective:  Lisa Greene is a 75 y.o. year old very pleasant female patient who presents for/with See problem oriented charting ROS- No chest pain or shortness of breath. No headache or blurry vision. Palpitations short lived one to two times a day as below  Past Medical History-  Patient Active Problem List   Diagnosis Date Noted  . Asthma, moderate persistent, well-controlled 04/19/2014    Priority: Medium  . Hypertension 11/25/2010    Priority: Medium  . Hyperlipidemia 12/20/2006    Priority: Medium  . Family history of abdominal aortic aneurysm 03/08/2015    Priority: Low  . Eosinophilia 02/08/2009    Priority: Low  . Allergic rhinitis 01/31/2008    Priority: Low  . Osteopenia 12/20/2006    Priority: Low    Medications- reviewed and updated Current Outpatient Prescriptions  Medication Sig Dispense Refill  . cholecalciferol (VITAMIN D) 1000 UNITS tablet Take 2,000 Units by mouth daily.    . fluticasone (FLONASE) 50 MCG/ACT nasal spray     . hydrochlorothiazide (HYDRODIURIL) 25 MG tablet TAKE 1 TABLET (25 MG TOTAL) BY MOUTH DAILY. 90 tablet 3  . losartan (COZAAR) 100 MG tablet Take 1 tablet (100 mg total) by mouth daily. 30 tablet 5  . metoprolol succinate (TOPROL-XL) 25 MG 24 hr tablet TAKE 1 TABLET (25 MG TOTAL) BY MOUTH DAILY. 90 tablet 3  . montelukast (SINGULAIR) 10 MG tablet     . SYMBICORT 160-4.5 MCG/ACT inhaler     . Albuterol Sulfate (PROAIR RESPICLICK) 123XX123 (90 BASE) MCG/ACT AEPB Inhale 1 puff into the lungs every 8 (eight) hours as needed. (Patient not taking: Reported on 02/12/2015) 1 each 0  . meclizine (ANTIVERT) 25 MG tablet Take 1 tablet (25 mg total) by mouth 3 (three) times daily as needed for dizziness. (Patient not taking: Reported on 07/16/2015) 30 tablet 1  . [DISCONTINUED] albuterol (PROVENTIL) 90 MCG/ACT inhaler Inhale 2 puffs into the lungs every 6 (six) hours as needed for wheezing. 17 g 0  . [DISCONTINUED] pantoprazole  (PROTONIX) 40 MG tablet Take 1 tablet by mouth daily.     No current facility-administered medications for this visit.    Objective: BP 132/70 mmHg  Pulse 77  Temp(Src) 98.8 F (37.1 C)  Wt 153 lb (69.4 kg) Gen: NAD, resting comfortably CV: RRR no murmurs rubs or gallops Lungs: CTAB no crackles, wheeze, rhonchi Abdomen: soft/nontender/nondistended/normal bowel sounds. No rebound or guarding.  Ext: no edema Skin: warm, dry Neuro: grossly normal, moves all extremities  Assessment/Plan:  Hypertension S: controlled in office with Hctz 25mg , metoprolol 25 mg XL, losartan 50mg . Home readings 67% of time above 150/90. Some over 140 as well. 1-2x a day feeling palpitations as well- that's what prompted her to restart checking. Only SE- occasional cramping but better in recent days BP Readings from Last 3 Encounters:  07/16/15 132/70  06/06/15 122/64  04/19/15 124/52  A/P:Continue current meds but increase losartan to 100mg  due to poor home control. HR and rhythm normal today- we discussed if persistent palpitations consider holter monitor for 24-48 hours. She will journal BP as well as palpitations.     4 weeks. Also see avs Return precautions advised.   Meds ordered this encounter  Medications  . losartan (COZAAR) 100 MG tablet    Sig: Take 1 tablet (100 mg total) by mouth daily.    Dispense:  30 tablet    Refill:  5

## 2015-08-13 ENCOUNTER — Encounter: Payer: Self-pay | Admitting: Family Medicine

## 2015-08-13 ENCOUNTER — Ambulatory Visit (INDEPENDENT_AMBULATORY_CARE_PROVIDER_SITE_OTHER): Payer: Medicare HMO | Admitting: Family Medicine

## 2015-08-13 VITALS — BP 122/64 | HR 72 | Temp 98.5°F | Wt 154.0 lb

## 2015-08-13 DIAGNOSIS — I10 Essential (primary) hypertension: Secondary | ICD-10-CM

## 2015-08-13 NOTE — Progress Notes (Signed)
Garret Reddish, MD  Subjective:  Lisa Greene is a 75 y.o. year old very pleasant female patient who presents for/with See problem oriented charting ROS- No chest pain or shortness of breath. No headache or blurry vision.   Past Medical History-  Patient Active Problem List   Diagnosis Date Noted  . Asthma, moderate persistent, well-controlled 04/19/2014    Priority: Medium  . Hypertension 11/25/2010    Priority: Medium  . Hyperlipidemia 12/20/2006    Priority: Medium  . Family history of abdominal aortic aneurysm 03/08/2015    Priority: Low  . Eosinophilia 02/08/2009    Priority: Low  . Allergic rhinitis 01/31/2008    Priority: Low  . Osteopenia 12/20/2006    Priority: Low    Medications- reviewed and updated Current Outpatient Prescriptions  Medication Sig Dispense Refill  . cholecalciferol (VITAMIN D) 1000 UNITS tablet Take 2,000 Units by mouth daily.    . fluticasone (FLONASE) 50 MCG/ACT nasal spray     . fluticasone furoate-vilanterol (BREO ELLIPTA) 100-25 MCG/INH AEPB Inhale 1 puff into the lungs daily.    . hydrochlorothiazide (HYDRODIURIL) 25 MG tablet TAKE 1 TABLET (25 MG TOTAL) BY MOUTH DAILY. 90 tablet 3  . losartan (COZAAR) 100 MG tablet Take 1 tablet (100 mg total) by mouth daily. 30 tablet 5  . metoprolol succinate (TOPROL-XL) 25 MG 24 hr tablet TAKE 1 TABLET (25 MG TOTAL) BY MOUTH DAILY. 90 tablet 3  . Albuterol Sulfate (PROAIR RESPICLICK) 123XX123 (90 BASE) MCG/ACT AEPB Inhale 1 puff into the lungs every 8 (eight) hours as needed. (Patient not taking: Reported on 02/12/2015) 1 each 0  . meclizine (ANTIVERT) 25 MG tablet Take 1 tablet (25 mg total) by mouth 3 (three) times daily as needed for dizziness. (Patient not taking: Reported on 07/16/2015) 30 tablet 1  . [DISCONTINUED] albuterol (PROVENTIL) 90 MCG/ACT inhaler Inhale 2 puffs into the lungs every 6 (six) hours as needed for wheezing. 17 g 0  . [DISCONTINUED] pantoprazole (PROTONIX) 40 MG tablet Take 1 tablet by  mouth daily.     No current facility-administered medications for this visit.    Objective: BP 122/64 mmHg  Pulse 72  Temp(Src) 98.5 F (36.9 C)  Wt 154 lb (69.854 kg) Gen: NAD, resting comfortably CV: RRR no murmurs rubs or gallops Lungs: CTAB no crackles, wheeze, rhonchi Abdomen: soft/nontender/nondistended/normal bowel sounds. No rebound or guarding.  Ext: no edema Skin: warm, dry Neuro: grossly normal, moves all extremities  Assessment/Plan:  Hypertension S: controlled on Hctz 25mg , metoprolol 25 mg XL, losartan 100mg . Multiple/trial erorrs to get here. Reviewed home readings and only 1/13 above 150. With only 6/13 above 140. 138/68 here compared to home cuff 152/67 BP Readings from Last 3 Encounters:  08/13/15 122/64  07/16/15 132/70  06/06/15 122/64  A/P:Continue current meds:  Doing very well at home and in office. Home cuff about 10 points higher. We discussed home monitoring and goals 150/90 on home cuff though prefer if able <140/90 here. She was having some palpitations before but those have largely resolved from prior 1-2x per day  has visit in may. Return precautions advised.   Meds ordered this encounter  Medications  . fluticasone furoate-vilanterol (BREO ELLIPTA) 100-25 MCG/INH AEPB    Sig: Inhale 1 puff into the lungs daily.   The duration of face-to-face time during this visit was 15 minutes. Greater than 50% of this time was spent in counseling, explanation of diagnosis, planning of further management, and/or coordination of care.

## 2015-08-13 NOTE — Patient Instructions (Signed)
Your home cuff seems to be about 10 points higher than our cuff here. I would not get rid of it at this point- continue to bring to visits and we will continue to compare readings. I would say if your home #s are above 150 more than a few times- return to see [place].   Continue current blood pressure medicines- circled  Glad your palpitations are better  Sorry I can't hear you murmur!

## 2015-08-13 NOTE — Assessment & Plan Note (Signed)
S: controlled on Hctz 25mg , metoprolol 25 mg XL, losartan 100mg . Multiple/trial erorrs to get here. Reviewed home readings and only 1/13 above 150. With only 6/13 above 140. 138/68 here compared to home cuff 152/67 BP Readings from Last 3 Encounters:  08/13/15 122/64  07/16/15 132/70  06/06/15 122/64  A/P:Continue current meds:  Doing very well at home and in office. Home cuff about 10 points higher. We discussed home monitoring and goals 150/90 on home cuff though prefer if able <140/90 here. She was having some palpitations before but those have largely resolved from prior 1-2x per day

## 2015-08-14 MED ORDER — HYDROCHLOROTHIAZIDE 25 MG PO TABS
ORAL_TABLET | ORAL | Status: DC
Start: 2015-08-14 — End: 2016-06-06

## 2015-08-14 MED ORDER — METOPROLOL SUCCINATE ER 25 MG PO TB24: ORAL_TABLET | ORAL | Status: DC

## 2015-08-14 MED ORDER — LOSARTAN POTASSIUM 100 MG PO TABS: 100.0000 mg | ORAL_TABLET | Freq: Every day | ORAL | Status: DC

## 2015-08-14 NOTE — Addendum Note (Signed)
Addended by: Marin Olp on: 08/14/2015 01:12 PM   Modules accepted: Orders

## 2015-10-01 DIAGNOSIS — G43909 Migraine, unspecified, not intractable, without status migrainosus: Secondary | ICD-10-CM | POA: Diagnosis not present

## 2015-10-15 ENCOUNTER — Other Ambulatory Visit (INDEPENDENT_AMBULATORY_CARE_PROVIDER_SITE_OTHER): Payer: Medicare HMO

## 2015-10-15 DIAGNOSIS — E785 Hyperlipidemia, unspecified: Secondary | ICD-10-CM

## 2015-10-15 DIAGNOSIS — I10 Essential (primary) hypertension: Secondary | ICD-10-CM | POA: Diagnosis not present

## 2015-10-15 LAB — LIPID PANEL
Cholesterol: 172 mg/dL (ref 0–200)
HDL: 55.6 mg/dL (ref >39.00)
LDL CALC: 98 mg/dL (ref 0–99)
NONHDL: 116.74
Total CHOL/HDL Ratio: 3
Triglycerides: 94 mg/dL (ref 0.0–149.0)
VLDL: 18.8 mg/dL (ref 0.0–40.0)

## 2015-10-15 LAB — COMPREHENSIVE METABOLIC PANEL
ALK PHOS: 53 U/L (ref 39–117)
ALT: 13 U/L (ref 0–35)
AST: 17 U/L (ref 0–37)
Albumin: 4.1 g/dL (ref 3.5–5.2)
BUN: 18 mg/dL (ref 6–23)
CHLORIDE: 99 meq/L (ref 96–112)
CO2: 25 mEq/L (ref 19–32)
Calcium: 9 mg/dL (ref 8.4–10.5)
Creatinine, Ser: 1.2 mg/dL (ref 0.40–1.20)
GFR: 46.52 mL/min — AB (ref >60.00)
GLUCOSE: 100 mg/dL — AB (ref 70–99)
POTASSIUM: 3.6 meq/L (ref 3.5–5.1)
Sodium: 133 mEq/L — ABNORMAL LOW (ref 135–145)
TOTAL PROTEIN: 6.4 g/dL (ref 6.0–8.3)
Total Bilirubin: 0.9 mg/dL (ref 0.2–1.2)

## 2015-10-15 LAB — CBC
HCT: 37 % (ref 36.0–46.0)
HEMOGLOBIN: 12.6 g/dL (ref 12.0–15.0)
MCHC: 34.2 g/dL (ref 30.0–36.0)
MCV: 89.2 fl (ref 78.0–100.0)
PLATELETS: 341 10*3/uL (ref 150.0–400.0)
RBC: 4.15 Mil/uL (ref 3.87–5.11)
RDW: 12.9 % (ref 11.5–15.5)
WBC: 6.1 10*3/uL (ref 4.0–10.5)

## 2015-10-21 DIAGNOSIS — Z1231 Encounter for screening mammogram for malignant neoplasm of breast: Secondary | ICD-10-CM | POA: Diagnosis not present

## 2015-10-21 LAB — HM MAMMOGRAPHY

## 2015-10-22 ENCOUNTER — Ambulatory Visit (INDEPENDENT_AMBULATORY_CARE_PROVIDER_SITE_OTHER): Payer: Medicare HMO | Admitting: Family Medicine

## 2015-10-22 ENCOUNTER — Encounter: Payer: Self-pay | Admitting: Family Medicine

## 2015-10-22 VITALS — BP 128/72 | HR 65 | Temp 97.8°F | Ht 62.25 in | Wt 153.0 lb

## 2015-10-22 DIAGNOSIS — R921 Mammographic calcification found on diagnostic imaging of breast: Secondary | ICD-10-CM | POA: Diagnosis not present

## 2015-10-22 DIAGNOSIS — N6002 Solitary cyst of left breast: Secondary | ICD-10-CM | POA: Diagnosis not present

## 2015-10-22 DIAGNOSIS — Z0000 Encounter for general adult medical examination without abnormal findings: Secondary | ICD-10-CM | POA: Diagnosis not present

## 2015-10-22 DIAGNOSIS — N1832 Chronic kidney disease, stage 3b: Secondary | ICD-10-CM | POA: Insufficient documentation

## 2015-10-22 DIAGNOSIS — N183 Chronic kidney disease, stage 3 unspecified: Secondary | ICD-10-CM | POA: Insufficient documentation

## 2015-10-22 NOTE — Patient Instructions (Addendum)
You are doing so well!   Cholesterol looks great. Weight down 1 pound- great job. Glad you are being so active.   I would also like for you to sign up for an annual wellness visit on a Friday with our nurse Manuela Schwartz. This is a free benefit under medicare that may help [place] find additional ways to help you.

## 2015-10-22 NOTE — Progress Notes (Signed)
Phone: [phone]  Subjective:  Patient presents today for their annual physical. Chief complaint-noted.   See problem oriented charting- ROS- full  review of systems was completed and negative including No chest pain or shortness of breath. No headache or blurry vision.   The following were reviewed and entered/updated in epic: Past Medical History  Diagnosis Date  . ALLERGIC RHINITIS 01/31/2008  . DIVERTICULITIS, HX OF 09/07/2008    pt denies this   . Eosinophilia 02/08/2009  . HYPERLIPIDEMIA 12/20/2006  . OSTEOPOROSIS 12/20/2006  . Allergy   . Osteopenia   . Cataract   . Heart murmur     slight per pt.   . Hypertension   . Colon cancer screening 05/21/2014    No polyps at age 35. Diverticulosis alone-no more colonoscopies.     Patient Active Problem List   Diagnosis Date Noted  . Asthma, moderate persistent, well-controlled 04/19/2014    Priority: Medium  . Hypertension 11/25/2010    Priority: Medium  . Hyperlipidemia 12/20/2006    Priority: Medium  . Family history of abdominal aortic aneurysm 03/08/2015    Priority: Low  . Eosinophilia 02/08/2009    Priority: Low  . Allergic rhinitis 01/31/2008    Priority: Low  . Osteopenia 12/20/2006    Priority: Low   Past Surgical History  Procedure Laterality Date  . Thyroid duct cyst    . Knee arthroscopy      left  . Neuroma surgery      x2 feet  . Cataract extraction bilateral w/ anterior vitrectomy  2013    bilateral cataracts  . Breast surgery      cysts removed x2  . Colonoscopy  2005    tics only     Family History  Problem Relation Age of Onset  . COPD Father   . Heart disease Father     stent placed 50  . Colon cancer Neg Hx   . Stomach cancer Neg Hx     Medications- reviewed and updated Current Outpatient Prescriptions  Medication Sig Dispense Refill  . Albuterol Sulfate (PROAIR RESPICLICK) 123XX123 (90 BASE) MCG/ACT AEPB Inhale 1 puff into the lungs every 8 (eight) hours as needed. (Patient not  taking: Reported on 02/12/2015) 1 each 0  . cholecalciferol (VITAMIN D) 1000 UNITS tablet Take 2,000 Units by mouth daily.    . fluticasone (FLONASE) 50 MCG/ACT nasal spray     . fluticasone furoate-vilanterol (BREO ELLIPTA) 100-25 MCG/INH AEPB Inhale 1 puff into the lungs daily.    . hydrochlorothiazide (HYDRODIURIL) 25 MG tablet TAKE 1 TABLET (25 MG TOTAL) BY MOUTH DAILY. 90 tablet 3  . losartan (COZAAR) 100 MG tablet Take 1 tablet (100 mg total) by mouth daily. 90 tablet 3  . meclizine (ANTIVERT) 25 MG tablet Take 1 tablet (25 mg total) by mouth 3 (three) times daily as needed for dizziness. (Patient not taking: Reported on 07/16/2015) 30 tablet 1  . metoprolol succinate (TOPROL-XL) 25 MG 24 hr tablet TAKE 1 TABLET (25 MG TOTAL) BY MOUTH DAILY. 90 tablet 3  . [DISCONTINUED] albuterol (PROVENTIL) 90 MCG/ACT inhaler Inhale 2 puffs into the lungs every 6 (six) hours as needed for wheezing. 17 g 0  . [DISCONTINUED] pantoprazole (PROTONIX) 40 MG tablet Take 1 tablet by mouth daily.     No current facility-administered medications for this visit.    Allergies-reviewed and updated Allergies  Allergen Reactions  . Lisinopril     Dry cough and stinging rash  . Penicillins  REACTION: rash  . Sulfonamide Derivatives     REACTION: rash    Social History   Social History  . Marital Status: Married    Spouse Name: N/A  . Number of Children: N/A  . Years of Education: N/A   Social History Main Topics  . Smoking status: Never Smoker   . Smokeless tobacco: Never Used  . Alcohol Use: No  . Drug Use: No  . Sexual Activity: Not on file   Other Topics Concern  . Not on file   Social History Narrative   Married. No kids- 2 step kids. 5 step grandkids.       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 Gen: NAD, resting comfortably HEENT: Mucous membranes are  moist. Oropharynx normal Neck: no thyromegaly CV: RRR no murmurs rubs or gallops Lungs: CTAB no crackles, wheeze, rhonchi Abdomen: soft/nontender/nondistended/normal bowel sounds. No rebound or guarding.  Ext: no edema Skin: warm, dry Neuro: grossly normal, moves all extremities, PERRLA  Breasts: normal appearance, no masses or tenderness  Assessment/Plan:  75 y.o. female presenting for annual physical.  Health Maintenance counseling: 1. Anticipatory guidance: Patient counseled regarding regular dental exams, eye exams, wearing seatbelts.  2. Risk factor reduction:  Advised patient of need for regular exercise and diet rich and fruits and vegetables to reduce risk of heart attack and stroke. Very active in the yard- at least 1.5 hours once a week and then spends other time. Walks when goes to [employer]- goes at least a week a month.  3. Immunizations/screenings/ancillary studies Immunization History  Administered Date(s) Administered  . Influenza Whole 03/02/2011  . Pneumococcal Conjugate-13 01/19/2007, 04/19/2014, 06/06/2015  . Pneumococcal Polysaccharide-23 01/19/2007  . Td 07/02/2002  . Tdap 08/22/2012  . Zoster 02/10/2010  4. Cervical cancer screening- aged out 20. Breast cancer screening-  breast exam today hand mammogram 10/20/14 and repeated yesterday- awaiting results 6. Colon cancer screening - 10/09/13 with no recall due to age per Dr. Olevia Perches 7. Skin cancer screening- Dr Jerrell Belfast once a year  Status of chronic or acute concerns  Hyperlipidemia- previou spoor control, now with excellent control on red yeast rice- will continue  Wt Readings from Last 3 Encounters:  10/22/15 153 lb (69.4 kg)  08/13/15 154 lb (69.854 kg)  07/16/15 153 lb (69.4 kg)   Hypertension- well controlled on hctz 25, metoprolol 25mg  XL, losartan 100mg  BP Readings from Last 3 Encounters:  10/22/15 128/72  08/13/15 122/64  07/16/15 132/70   Asthma- controlled on breo and using albuterol once every 3  months about  Allergic Rhinitis- sees [facility] allergy and is on singulair, flonase.   Osteopenia with worst t-score -2.3 on 06/2015. Will plan on repeat in 2 years.     No problem-specific assessment & plan notes found for this encounter.   No Follow-up on file. Return precautions advised.   No orders of the defined types were placed in this encounter.    No orders of the defined types were placed in this encounter.    Garret Reddish, MD

## 2015-10-23 ENCOUNTER — Encounter: Payer: Self-pay | Admitting: Pediatrics

## 2015-10-30 ENCOUNTER — Other Ambulatory Visit: Payer: Self-pay | Admitting: Radiology

## 2015-10-30 ENCOUNTER — Encounter: Payer: Self-pay | Admitting: Family Medicine

## 2015-10-30 DIAGNOSIS — R921 Mammographic calcification found on diagnostic imaging of breast: Secondary | ICD-10-CM | POA: Diagnosis not present

## 2015-10-30 DIAGNOSIS — D0512 Intraductal carcinoma in situ of left breast: Secondary | ICD-10-CM | POA: Diagnosis not present

## 2015-10-30 DIAGNOSIS — Z0000 Encounter for general adult medical examination without abnormal findings: Secondary | ICD-10-CM | POA: Diagnosis not present

## 2015-11-01 ENCOUNTER — Encounter: Payer: Self-pay | Admitting: *Deleted

## 2015-11-01 ENCOUNTER — Telehealth: Payer: Self-pay | Admitting: *Deleted

## 2015-11-01 DIAGNOSIS — Z853 Personal history of malignant neoplasm of breast: Secondary | ICD-10-CM | POA: Insufficient documentation

## 2015-11-01 DIAGNOSIS — C50312 Malignant neoplasm of lower-inner quadrant of left female breast: Secondary | ICD-10-CM

## 2015-11-01 HISTORY — DX: Malignant neoplasm of lower-inner quadrant of left female breast: C50.312

## 2015-11-01 NOTE — Telephone Encounter (Signed)
Confirmed [facility] for 11/06/15 at 1215 .  Instructions and contact information given.

## 2015-11-04 ENCOUNTER — Encounter: Payer: Self-pay | Admitting: Family Medicine

## 2015-11-04 DIAGNOSIS — H1045 Other chronic allergic conjunctivitis: Secondary | ICD-10-CM | POA: Diagnosis not present

## 2015-11-04 DIAGNOSIS — J300 Vasomotor rhinitis: Secondary | ICD-10-CM | POA: Diagnosis not present

## 2015-11-04 DIAGNOSIS — J4540 Moderate persistent asthma, uncomplicated: Secondary | ICD-10-CM | POA: Diagnosis not present

## 2015-11-06 ENCOUNTER — Ambulatory Visit: Payer: Medicare HMO | Admitting: Physical Therapy

## 2015-11-06 ENCOUNTER — Ambulatory Visit
Admission: RE | Admit: 2015-11-06 | Discharge: 2015-11-06 | Disposition: A | Discharge status: Home / Self Care | Payer: Medicare HMO | Source: Ambulatory Visit | Attending: Radiation Oncology | Admitting: Radiation Oncology

## 2015-11-06 ENCOUNTER — Encounter: Payer: Self-pay | Admitting: Hematology and Oncology

## 2015-11-06 ENCOUNTER — Other Ambulatory Visit (HOSPITAL_BASED_OUTPATIENT_CLINIC_OR_DEPARTMENT_OTHER): Payer: Medicare HMO

## 2015-11-06 ENCOUNTER — Encounter: Payer: Self-pay | Admitting: *Deleted

## 2015-11-06 ENCOUNTER — Encounter: Payer: Self-pay | Admitting: General Practice

## 2015-11-06 ENCOUNTER — Encounter: Payer: Self-pay | Admitting: Family Medicine

## 2015-11-06 ENCOUNTER — Ambulatory Visit (HOSPITAL_BASED_OUTPATIENT_CLINIC_OR_DEPARTMENT_OTHER): Payer: Medicare HMO | Admitting: Hematology and Oncology

## 2015-11-06 ENCOUNTER — Other Ambulatory Visit: Payer: Self-pay | Admitting: General Surgery

## 2015-11-06 VITALS — BP 155/63 | HR 78 | Temp 98.5°F | Resp 18 | Ht 62.25 in | Wt 153.6 lb

## 2015-11-06 DIAGNOSIS — D0512 Intraductal carcinoma in situ of left breast: Secondary | ICD-10-CM

## 2015-11-06 DIAGNOSIS — C50312 Malignant neoplasm of lower-inner quadrant of left female breast: Secondary | ICD-10-CM

## 2015-11-06 LAB — CBC WITH DIFFERENTIAL/PLATELET
BASO%: 0.2 % (ref 0.0–2.0)
Basophils Absolute: 0 10*3/uL (ref 0.0–0.1)
EOS%: 2 % (ref 0.0–7.0)
Eosinophils Absolute: 0.2 10*3/uL (ref 0.0–0.5)
HCT: 38.9 % (ref 34.8–46.6)
HEMOGLOBIN: 13.7 g/dL (ref 11.6–15.9)
LYMPH%: 16.9 % (ref 14.0–49.7)
MCH: 31 pg (ref 25.1–34.0)
MCHC: 35.2 g/dL (ref 31.5–36.0)
MCV: 88 fL (ref 79.5–101.0)
MONO#: 0.6 10*3/uL (ref 0.1–0.9)
MONO%: 7.4 % (ref 0.0–14.0)
NEUT%: 73.5 % (ref 38.4–76.8)
NEUTROS ABS: 6 10*3/uL (ref 1.5–6.5)
Platelets: 308 10*3/uL (ref 145–400)
RBC: 4.42 10*6/uL (ref 3.70–5.45)
RDW: 12.5 % (ref 11.2–14.5)
WBC: 8.2 10*3/uL (ref 3.9–10.3)
lymph#: 1.4 10*3/uL (ref 0.9–3.3)

## 2015-11-06 LAB — COMPREHENSIVE METABOLIC PANEL
ALBUMIN: 3.9 g/dL (ref 3.5–5.0)
ALK PHOS: 63 U/L (ref 40–150)
ALT: 13 U/L (ref 0–55)
AST: 15 U/L (ref 5–34)
Anion Gap: 9 mEq/L (ref 3–11)
BILIRUBIN TOTAL: 0.57 mg/dL (ref 0.20–1.20)
BUN: 12.8 mg/dL (ref 7.0–26.0)
CO2: 26 meq/L (ref 22–29)
Calcium: 9.9 mg/dL (ref 8.4–10.4)
Chloride: 102 mEq/L (ref 98–109)
Creatinine: 1.1 mg/dL (ref 0.6–1.1)
EGFR: 47 mL/min/{1.73_m2} — ABNORMAL LOW (ref >90)
GLUCOSE: 145 mg/dL — AB (ref 70–140)
Potassium: 4.3 mEq/L (ref 3.5–5.1)
SODIUM: 137 meq/L (ref 136–145)
TOTAL PROTEIN: 7.2 g/dL (ref 6.4–8.3)

## 2015-11-06 NOTE — Assessment & Plan Note (Signed)
Left breast biopsy 10/30/2015: DCIS intermediate grade with calcifications (micropapillary architecture), fibrocystic changes with adenosis, complex sclerosing lesion, UDH, ER 95%, PR 80%; mammogram in ultrasound showed right breast calcifications 4.1 cm Clinical stage: Tis N0 stage 0  Pathology review: I discussed with the patient the difference between DCIS and invasive breast cancer. It is considered a precancerous lesion. DCIS is classified as a 0. It is generally detected through mammograms as calcifications. We discussed the significance of grades and its impact on prognosis. We also discussed the importance of ER and PR receptors and their implications to adjuvant treatment options. Prognosis of DCIS dependence on grade, comedo necrosis. It is anticipated that if not treated, 20-30% of DCIS can develop into invasive breast cancer.  Recommendation: 1. Breast conserving surgery 2. Followed by adjuvant radiation therapy 3. Followed by antiestrogen therapy with tamoxifen 5 years  Tamoxifen counseling: We discussed the risks and benefits of tamoxifen. These include but not limited to insomnia, hot flashes, mood changes, vaginal dryness, and weight gain. Although rare, serious side effects including endometrial cancer, risk of blood clots were also discussed. We strongly believe that the benefits far outweigh the risks. Patient understands these risks and consented to starting treatment. Planned treatment duration is 5 years.  Return to clinic after surgery to discuss the final pathology report and come up with an adjuvant treatment plan.

## 2015-11-06 NOTE — Progress Notes (Signed)
Lisa Greene  Patient Care Team: Marin Olp, MD as PCP - General (Family Medicine) Rolm Bookbinder, MD as Consulting Physician (General Surgery) Nicholas Lose, MD as Consulting Physician (Hematology and Oncology) Kyung Rudd, MD as Consulting Physician (Radiation Oncology)  CHIEF COMPLAINTS/PURPOSE OF CONSULTATION:  Newly diagnosed breast cancer  HISTORY OF PRESENTING ILLNESS:  Lisa Greene 75 y.o. female is here because of recent diagnosis of left breast DCIS. In 1983 she had benign biopsies. She did not have any lumps or nodules or any discomfort. She reveals screening mammogram that revealed calcifications measuring 4.1 cm. She underwent an ultrasound and biopsy which revealed intermediate grade DCIS. The tumor was ER/PR positive. They had biopsied a second area which was sclerosing adenosis. The third area of abnormality is felt to be fibrocystic changes. Patient was presented this morning at the multidisciplinary tumor board and she is here today to discuss a treatment plan.  I reviewed her records extensively and collaborated the history with the patient.  SUMMARY OF ONCOLOGIC HISTORY:   Breast cancer of lower-inner quadrant of left female breast ([place])   10/30/2015 Initial Diagnosis Left breast biopsy: DCIS intermediate grade with calcifications (micropapillary architecture), fibrocystic changes with adenosis, complex sclerosing lesion, UDH, ER 95%, PR 80%; mammogram in ultrasound showed right breast calcifications 4.1 cm    In terms of breast cancer risk profile:  She menarched at early age of 52 and went to menopause at age 34  She had no children  She had received birth control pills for approximately 2 years.  She was never exposed to fertility medications or hormone replacement therapy.  She has no family history of Breast/GYN/GI cancer  MEDICAL HISTORY:  Past Medical History  Diagnosis Date  . ALLERGIC RHINITIS 01/31/2008  .  DIVERTICULITIS, HX OF 09/07/2008    pt denies this   . Eosinophilia 02/08/2009  . HYPERLIPIDEMIA 12/20/2006  . OSTEOPOROSIS 12/20/2006  . Allergy   . Osteopenia   . Cataract   . Heart murmur     slight per pt.   . Hypertension   . Colon cancer screening 05/21/2014    No polyps at age 70. Diverticulosis alone-no more colonoscopies.    . Breast cancer of lower-inner quadrant of left female breast ([place]) 11/01/2015    SURGICAL HISTORY: Past Surgical History  Procedure Laterality Date  . Thyroid duct cyst    . Knee arthroscopy      left  . Neuroma surgery      x2 feet  . Cataract extraction bilateral w/ anterior vitrectomy  2013    bilateral cataracts  . Breast surgery      cysts removed x2  . Colonoscopy  2005    tics only     SOCIAL HISTORY: Social History   Social History  . Marital Status: Married    Spouse Name: N/A  . Number of Children: N/A  . Years of Education: N/A   Occupational History  . Not on file.   Social History Main Topics  . Smoking status: Never Smoker   . Smokeless tobacco: Never Used  . Alcohol Use: No  . Drug Use: No  . Sexual Activity: Not on file   Other Topics Concern  . Not on file   Social History Narrative   Married. No kids- 2 step kids. 5 step grandkids.       Retired from [employer] (different washes for [employer])      Hobbies: beach, read, crochet, knit, watch  tv    FAMILY HISTORY: Family History  Problem Relation Age of Onset  . COPD Father   . Heart disease Father     stent placed 2  . Colon cancer Neg Hx   . Stomach cancer Neg Hx     ALLERGIES:  is allergic to lisinopril; penicillins; and sulfonamide derivatives.  MEDICATIONS:  Current Outpatient Prescriptions  Medication Sig Dispense Refill  . Albuterol Sulfate (PROAIR RESPICLICK) 123XX123 (90 BASE) MCG/ACT AEPB Inhale 1 puff into the lungs every 8 (eight) hours as needed. 1 each 0  . cholecalciferol (VITAMIN D) 1000 UNITS tablet Take 2,000 Units by mouth daily.    .  fluticasone (FLONASE) 50 MCG/ACT nasal spray Place 1 spray into both nostrils daily as needed.     . fluticasone furoate-vilanterol (BREO ELLIPTA) 100-25 MCG/INH AEPB Inhale 1 puff into the lungs daily.    . hydrochlorothiazide (HYDRODIURIL) 25 MG tablet TAKE 1 TABLET (25 MG TOTAL) BY MOUTH DAILY. 90 tablet 3  . losartan (COZAAR) 100 MG tablet Take 1 tablet (100 mg total) by mouth daily. 90 tablet 3  . meclizine (ANTIVERT) 25 MG tablet Take 1 tablet (25 mg total) by mouth 3 (three) times daily as needed for dizziness. (Patient not taking: Reported on 07/16/2015) 30 tablet 1  . metoprolol succinate (TOPROL-XL) 25 MG 24 hr tablet TAKE 1 TABLET (25 MG TOTAL) BY MOUTH DAILY. 90 tablet 3  . montelukast (SINGULAIR) 10 MG tablet Take 1 tablet by mouth at bedtime.    . Red Yeast Rice Extract (RED YEAST RICE PO) Take 2,000 mg by mouth daily.    . [DISCONTINUED] albuterol (PROVENTIL) 90 MCG/ACT inhaler Inhale 2 puffs into the lungs every 6 (six) hours as needed for wheezing. 17 g 0  . [DISCONTINUED] pantoprazole (PROTONIX) 40 MG tablet Take 1 tablet by mouth daily.     No current facility-administered medications for this visit.    REVIEW OF SYSTEMS:   Constitutional: Denies fevers, chills or abnormal night sweats Eyes: Denies blurriness of vision, double vision or watery eyes Ears, nose, mouth, throat, and face: Denies mucositis or sore throat Respiratory: Denies cough, dyspnea or wheezes Cardiovascular: Denies palpitation, chest discomfort or lower extremity swelling Gastrointestinal:  Denies nausea, heartburn or change in bowel habits Skin: Denies abnormal skin rashes Lymphatics: Denies new lymphadenopathy or easy bruising Neurological:Denies numbness, tingling or new weaknesses Behavioral/Psych: Mood is stable, no new changes  Breast:  Denies any palpable lumps or discharge All other systems were reviewed with the patient and are negative.  PHYSICAL EXAMINATION: ECOG PERFORMANCE STATUS: 0 -  Asymptomatic  Filed Vitals:   11/06/15 1233  BP: 155/63  Pulse: 78  Temp: 98.5 F (36.9 C)  Resp: 18   Filed Weights   11/06/15 1233  Weight: 153 lb 9.6 oz (69.673 kg)    GENERAL:alert, no distress and comfortable SKIN: skin color, texture, turgor are normal, no rashes or significant lesions EYES: normal, conjunctiva are pink and non-injected, sclera clear OROPHARYNX:no exudate, no erythema and lips, buccal mucosa, and tongue normal  NECK: supple, thyroid normal size, non-tender, without nodularity LYMPH:  no palpable lymphadenopathy in the cervical, axillary or inguinal LUNGS: clear to auscultation and percussion with normal breathing effort HEART: regular rate & rhythm and no murmurs and no lower extremity edema ABDOMEN:abdomen soft, non-tender and normal bowel sounds Musculoskeletal:no cyanosis of digits and no clubbing  PSYCH: alert & oriented x 3 with fluent speech NEURO: no focal motor/sensory deficits BREAST: No palpable nodules in breast other  than postbiopsy changes in the left breast. No palpable axillary or supraclavicular lymphadenopathy (exam performed in the presence of a chaperone)   LABORATORY DATA:  I have reviewed the data as listed Lab Results  Component Value Date   WBC 8.2 11/06/2015   HGB 13.7 11/06/2015   HCT 38.9 11/06/2015   MCV 88.0 11/06/2015   PLT 308 11/06/2015   Lab Results  Component Value Date   NA 137 11/06/2015   K 4.3 11/06/2015   CL 99 10/15/2015   CO2 26 11/06/2015    RADIOGRAPHIC STUDIES: I have personally reviewed the radiological reports and agreed with the findings in the report.  ASSESSMENT AND PLAN:  Breast cancer of lower-inner quadrant of left female breast ([place]) Left breast biopsy 10/30/2015: DCIS intermediate grade with calcifications (micropapillary architecture), fibrocystic changes with adenosis, complex sclerosing lesion, UDH, ER 95%, PR 80%; mammogram in ultrasound showed right breast calcifications 4.1  cm Clinical stage: Tis N0 stage 0  Pathology review: I discussed with the patient the difference between DCIS and invasive breast cancer. It is considered a precancerous lesion. DCIS is classified as a 0. It is generally detected through mammograms as calcifications. We discussed the significance of grades and its impact on prognosis. We also discussed the importance of ER and PR receptors and their implications to adjuvant treatment options. Prognosis of DCIS dependence on grade, comedo necrosis. It is anticipated that if not treated, 20-30% of DCIS can develop into invasive breast cancer.  Recommendation: 1. Breast conserving surgery 2. Followed by adjuvant radiation therapy 3. Followed by antiestrogen therapy with tamoxifen 5 years  Tamoxifen counseling: We discussed the risks and benefits of tamoxifen. These include but not limited to insomnia, hot flashes, mood changes, vaginal dryness, and weight gain. Although rare, serious side effects including endometrial cancer, risk of blood clots were also discussed. We strongly believe that the benefits far outweigh the risks. Patient understands these risks and consented to starting treatment. Planned treatment duration is 5 years.  Return to clinic after surgery to discuss the final pathology report and come up with an adjuvant treatment plan.  All questions were answered. The patient knows to call the clinic with any problems, questions or concerns.    Rulon Eisenmenger, MD 11/06/2015

## 2015-11-06 NOTE — Addendum Note (Signed)
Addended by: Cheree Ditto on: 11/06/2015 04:43 PM   Modules accepted: Orders, Medications

## 2015-11-07 ENCOUNTER — Other Ambulatory Visit: Payer: Self-pay | Admitting: Family Medicine

## 2015-11-08 NOTE — Progress Notes (Signed)
[facility] Psychosocial Distress Screening Spiritual Care  Shadowed by counseling intern Aviry Reich/LPCA, met with pt, who goes by Lisa Greene, and her husband at [facility].  Reviewed distress screen per protocol.  The patient scored a 0 on the Psychosocial Distress Thermometer which indicates mild distress. Also assessed for distress and other psychosocial needs.   ON[facility]N DISTRESS SCREENING 11/08/2015  Screening Type Initial Screening  Distress experienced in past week (1-10) 0  Referral to support programs Yes  Other Spiritual Care, counseling intern   Lisa Greene reports no distress, approaching dx very matter-of-factly.  Per pt, she has good support from church and is buoyed up by prayer and faith.  They report no needs at this time.  Follow up needed: No.  Family is aware of ongoing [place] and team availability as desired.  Please also call if needs arise/circumstances change.  Thank you.  [place], [place], [facility] Pager [phone] Voicemail [phone]

## 2015-11-13 ENCOUNTER — Encounter: Payer: Self-pay | Admitting: Radiation Oncology

## 2015-11-13 NOTE — Progress Notes (Signed)
Radiation Oncology         [phone] ________________________________  Name: Lisa Greene MRN: [id number]  Date: 11/06/2015  DOB: 18-Feb-1941  [id number] Yong Channel, MD  Rolm Bookbinder, MD     REFERRING PHYSICIAN: Rolm Bookbinder, MD   DIAGNOSIS: The encounter diagnosis was Breast cancer of lower-inner quadrant of left female breast [facility]).  Breast cancer of lower-inner quadrant of left female breast [facility])   Staging form: Breast, AJCC 7th Edition     Clinical stage from 11/06/2015: Stage 0 (Tis (DCIS), N0, M0) - Unsigned       Staging comments: Staged at breast conference on 6.7.17     [place] ILLNESS::Lisa Greene is a 75 y.o. female who is seen for an initial consultation visit regarding the patient's diagnosis of DCIS of the left breast.  The patient presented was originally with suspicious calcifications on screening mammogram. A diagnostic mammogram confirmed this finding and a stereotactic biopsy of this area was performed. The area in question measured 4.1 cm. This returned positive for DCIS corresponding to a grade 2 tumor. Receptors studies have been performed and the tumor is estrogen receptor positive and progesterone receptor positive.  The patient has not undergone an MRI scan of the breasts.    PREVIOUS RADIATION THERAPY: No   PAST MEDICAL HISTORY:  has a past medical history of ALLERGIC RHINITIS (01/31/2008); DIVERTICULITIS, HX OF (09/07/2008); Eosinophilia (02/08/2009); HYPERLIPIDEMIA (12/20/2006); OSTEOPOROSIS (12/20/2006); Allergy; Osteopenia; Cataract; Heart murmur; Hypertension; Colon cancer screening (05/21/2014); Breast cancer of lower-inner quadrant of left female breast ([place]) (11/01/2015); and Breast cancer ([place]).     PAST SURGICAL HISTORY: Past Surgical History  Procedure Laterality Date  . Thyroid duct cyst    . Knee arthroscopy      left  . Neuroma surgery      x2 feet  . Cataract extraction bilateral w/ anterior vitrectomy  2013   bilateral cataracts  . Breast surgery      cysts removed x2  . Colonoscopy  2005    tics only      FAMILY HISTORY: family history includes Brain cancer in her mother; COPD in her father; Heart disease in her father. There is no history of Colon cancer or Stomach cancer.   SOCIAL HISTORY:  reports that she has never smoked. She has never used smokeless tobacco. She reports that she does not drink alcohol or use illicit drugs.   ALLERGIES: Lisinopril; Penicillins; and Sulfonamide derivatives   MEDICATIONS:  Current Outpatient Prescriptions  Medication Sig Dispense Refill  . Albuterol Sulfate (PROAIR RESPICLICK) [id number] (90 BASE) MCG/ACT AEPB Inhale 1 puff into the lungs every 8 (eight) hours as needed. 1 each 0  . Calcium-Vitamin D-Vitamin K (VIACTIV PO) Take by mouth.    . cholecalciferol (VITAMIN D) 1000 UNITS tablet Take 2,000 Units by mouth daily.    . Docosanol (ABREVA EX) Apply topically 3 (three) times daily.    . fluticasone (FLONASE) 50 MCG/ACT nasal spray Place 1 spray into both nostrils daily as needed.     . fluticasone furoate-vilanterol (BREO ELLIPTA) 100-25 MCG/INH AEPB Inhale 1 puff into the lungs daily.    . hydrochlorothiazide (HYDRODIURIL) 25 MG tablet TAKE 1 TABLET (25 MG TOTAL) BY MOUTH DAILY. 90 tablet 3  . losartan (COZAAR) 100 MG tablet TAKE 1 TABLET (100 MG TOTAL) BY MOUTH DAILY. 30 tablet 4  . metoprolol succinate (TOPROL-XL) 25 MG 24 hr tablet TAKE 1 TABLET (25 MG TOTAL) BY MOUTH DAILY. 90 tablet 3  .  montelukast (SINGULAIR) 10 MG tablet Take 1 tablet by mouth at bedtime.    . niacin 500 MG tablet Take 500 mg by mouth at bedtime.    . Red Yeast Rice Extract (RED YEAST RICE PO) Take 2,000 mg by mouth daily.    . [DISCONTINUED] albuterol (PROVENTIL) 90 MCG/ACT inhaler Inhale 2 puffs into the lungs every 6 (six) hours as needed for wheezing. 17 g 0  . [DISCONTINUED] pantoprazole (PROTONIX) 40 MG tablet Take 1 tablet by mouth daily.     No current  facility-administered medications for this encounter.     REVIEW OF SYSTEMS:  A 15 point review of systems is documented in the electronic medical record. This was obtained by the nursing staff. However, I reviewed this with the patient to discuss relevant findings and make appropriate changes.  Pertinent items are noted in HPI.    PHYSICAL EXAM:  vitals were not taken for this visit.  ECOG = 1  0 - Asymptomatic (Fully active, able to carry on all predisease activities without restriction)  1 - Symptomatic but completely ambulatory (Restricted in physically strenuous activity but ambulatory and able to carry out work of a light or sedentary nature. For example, light housework, office work)  2 - Symptomatic, <50% in bed during the day (Ambulatory and capable of all self care but unable to carry out any work activities. Up and about more than 50% of waking hours)  3 - Symptomatic, >50% in bed, but not bedbound (Capable of only limited self-care, confined to bed or chair 50% or more of waking hours)  4 - Bedbound (Completely disabled. Cannot carry on any self-care. Totally confined to bed or chair)  5 - Death   Eustace Pen MM, Creech RH, Tormey DC, et al. [phone]). "Toxicity and response criteria of the [facility]". [place] Oncol. 5 (6): 649-55  General: Well-developed, in no acute distress HEENT: Normocephalic, atraumatic; oral cavity clear Neuro: No focal deficits     LABORATORY DATA:  Lab Results  Component Value Date   WBC 8.2 11/06/2015   HGB 13.7 11/06/2015   HCT 38.9 11/06/2015   MCV 88.0 11/06/2015   PLT 308 11/06/2015   Lab Results  Component Value Date   NA 137 11/06/2015   K 4.3 11/06/2015   CL 99 10/15/2015   CO2 26 11/06/2015   Lab Results  Component Value Date   ALT 13 11/06/2015   AST 15 11/06/2015   ALKPHOS 63 11/06/2015   BILITOT 0.57 11/06/2015      RADIOGRAPHY: No results found.     IMPRESSION:    Breast cancer of  lower-inner quadrant of left female breast ([place])   10/30/2015 Initial Diagnosis Left breast biopsy: DCIS intermediate grade with calcifications (micropapillary architecture), fibrocystic changes with adenosis, complex sclerosing lesion, UDH, ER 95%, PR 80%; mammogram in ultrasound showed right breast calcifications 4.1 cm    The patient has a recent diagnosis of DCIS of the Left breast. She appears to be a good candidate for breast conservation treatment.  I discussed with the patient the role of adjuvant radiation treatment in this setting. We discussed the potential benefit of radiation treatment, especially with regards to local control of the patient's tumor. We also discussed the possible side effects and risks of such a treatment as well.  Given the patient's performance status, and size of the tumor at over 4 cm which is intermediate grade, I believe that it would be very reasonable to see the  patient postoperatively and review her final surgical results. The patient is currently gathering information and did not voice a strong preference at this time in terms of how aggressive she would like to be. The decision for this does not need to be made today and she was comfortable with seeing me after surgery to make a final decision.   All of the patient's questions were answered.   PLAN: I look forward to seeing the patient postoperatively to review her case and further discuss and coordinate a possible course of radiation treatment.  If we proceeded with this, the patient would be a good candidate for a hypo-fractionated, four-week course of treatment   I spent 45 minutes face to face with the patient and more than 50% of that time was spent in counseling and/or coordination of care.    ________________________________   Jodelle Gross, MD, PhD   **Disclaimer: This note was dictated with voice recognition software. Similar sounding words can inadvertently be transcribed and this note may  contain transcription errors which may not have been corrected upon publication of note.**

## 2015-11-14 ENCOUNTER — Telehealth: Payer: Self-pay | Admitting: *Deleted

## 2015-11-14 ENCOUNTER — Other Ambulatory Visit: Payer: Self-pay | Admitting: General Surgery

## 2015-11-14 DIAGNOSIS — C50312 Malignant neoplasm of lower-inner quadrant of left female breast: Secondary | ICD-10-CM

## 2015-11-14 NOTE — Telephone Encounter (Signed)
  Oncology Nurse Navigator Documentation    Navigator Encounter Type: Telephone (11/14/15 1000) Telephone: Lisa Greene Call;Clinic/MDC Follow-up (11/14/15 1000)  Denies questions or concerns regarding dx or treatment care plan   Surgery Date: 11/28/15 (11/14/15 1000) Treatment Initiated Date: 11/28/15 (11/14/15 1000)     Barriers/Navigation Needs: Coordination of Care (11/14/15 1000)   Interventions: Coordination of Care (11/14/15 1000)  Scheduled and confirmed post op appt with Dr. Lindi Adie on 12/13/15 at 8:15                    Time Spent with Patient: 15 (11/14/15 1000)

## 2015-11-25 ENCOUNTER — Encounter (HOSPITAL_BASED_OUTPATIENT_CLINIC_OR_DEPARTMENT_OTHER): Payer: Self-pay | Admitting: *Deleted

## 2015-11-26 ENCOUNTER — Inpatient Hospital Stay: Admission: RE | Admit: 2015-11-26 | Payer: Medicare HMO | Source: Ambulatory Visit

## 2015-11-26 DIAGNOSIS — R921 Mammographic calcification found on diagnostic imaging of breast: Secondary | ICD-10-CM | POA: Diagnosis not present

## 2015-11-26 DIAGNOSIS — C50312 Malignant neoplasm of lower-inner quadrant of left female breast: Secondary | ICD-10-CM | POA: Diagnosis not present

## 2015-11-26 NOTE — Pre-Procedure Instructions (Signed)
Pt in to pick up boost breeze, instructions reviewed. 

## 2015-11-28 ENCOUNTER — Ambulatory Visit (HOSPITAL_BASED_OUTPATIENT_CLINIC_OR_DEPARTMENT_OTHER): Payer: Medicare HMO | Admitting: Anesthesiology

## 2015-11-28 ENCOUNTER — Encounter (HOSPITAL_BASED_OUTPATIENT_CLINIC_OR_DEPARTMENT_OTHER)
Admission: RE | Disposition: A | Discharge status: Home / Self Care | Payer: Self-pay | Source: Ambulatory Visit | Attending: General Surgery

## 2015-11-28 ENCOUNTER — Ambulatory Visit (HOSPITAL_BASED_OUTPATIENT_CLINIC_OR_DEPARTMENT_OTHER)
Admission: RE | Admit: 2015-11-28 | Discharge: 2015-11-28 | Disposition: A | Discharge status: Home / Self Care | Payer: Medicare HMO | Source: Ambulatory Visit | Attending: General Surgery | Admitting: General Surgery

## 2015-11-28 ENCOUNTER — Encounter (HOSPITAL_BASED_OUTPATIENT_CLINIC_OR_DEPARTMENT_OTHER): Payer: Self-pay | Admitting: Anesthesiology

## 2015-11-28 DIAGNOSIS — D0512 Intraductal carcinoma in situ of left breast: Secondary | ICD-10-CM | POA: Diagnosis not present

## 2015-11-28 DIAGNOSIS — N183 Chronic kidney disease, stage 3 (moderate): Secondary | ICD-10-CM | POA: Insufficient documentation

## 2015-11-28 DIAGNOSIS — I10 Essential (primary) hypertension: Secondary | ICD-10-CM | POA: Insufficient documentation

## 2015-11-28 DIAGNOSIS — C50312 Malignant neoplasm of lower-inner quadrant of left female breast: Secondary | ICD-10-CM | POA: Diagnosis not present

## 2015-11-28 DIAGNOSIS — I129 Hypertensive chronic kidney disease with stage 1 through stage 4 chronic kidney disease, or unspecified chronic kidney disease: Secondary | ICD-10-CM | POA: Insufficient documentation

## 2015-11-28 DIAGNOSIS — J45909 Unspecified asthma, uncomplicated: Secondary | ICD-10-CM | POA: Diagnosis not present

## 2015-11-28 DIAGNOSIS — C50912 Malignant neoplasm of unspecified site of left female breast: Secondary | ICD-10-CM | POA: Diagnosis not present

## 2015-11-28 HISTORY — DX: Unspecified asthma, uncomplicated: J45.909

## 2015-11-28 HISTORY — PX: BREAST LUMPECTOMY WITH RADIOACTIVE SEED LOCALIZATION: SHX6424

## 2015-11-28 SURGERY — BREAST LUMPECTOMY WITH RADIOACTIVE SEED LOCALIZATION
Anesthesia: General | Site: Breast | Laterality: Left

## 2015-11-28 MED ORDER — ACETAMINOPHEN 500 MG PO TABS
ORAL_TABLET | ORAL | Status: AC
  Filled 2015-11-28: qty 2

## 2015-11-28 MED ORDER — FENTANYL CITRATE (PF) 100 MCG/2ML IJ SOLN
50.0000 ug | INTRAMUSCULAR | Status: DC | PRN
  Administered 2015-11-28: 50 ug via INTRAVENOUS

## 2015-11-28 MED ORDER — ONDANSETRON HCL 4 MG/2ML IJ SOLN
INTRAMUSCULAR | Status: DC | PRN
  Administered 2015-11-28: 4 mg via INTRAVENOUS

## 2015-11-28 MED ORDER — GABAPENTIN 300 MG PO CAPS
ORAL_CAPSULE | ORAL | Status: AC
  Filled 2015-11-28: qty 1

## 2015-11-28 MED ORDER — ACETAMINOPHEN 500 MG PO TABS
1000.0000 mg | ORAL_TABLET | ORAL | Status: AC
  Administered 2015-11-28: 1000 mg via ORAL

## 2015-11-28 MED ORDER — DEXAMETHASONE SODIUM PHOSPHATE 4 MG/ML IJ SOLN
INTRAMUSCULAR | Status: DC | PRN
  Administered 2015-11-28: 10 mg via INTRAVENOUS

## 2015-11-28 MED ORDER — GLYCOPYRROLATE 0.2 MG/ML IJ SOLN: 0.2000 mg | Freq: Once | INTRAMUSCULAR | Status: DC | PRN

## 2015-11-28 MED ORDER — PROPOFOL 10 MG/ML IV BOLUS
INTRAVENOUS | Status: DC | PRN
  Administered 2015-11-28: 200 mg via INTRAVENOUS

## 2015-11-28 MED ORDER — ONDANSETRON HCL 4 MG/2ML IJ SOLN
INTRAMUSCULAR | Status: AC
  Filled 2015-11-28: qty 2

## 2015-11-28 MED ORDER — HYDROCODONE-ACETAMINOPHEN 10-325 MG PO TABS: 1.0000 | ORAL_TABLET | Freq: Four times a day (QID) | ORAL | Status: DC | PRN

## 2015-11-28 MED ORDER — BUPIVACAINE HCL (PF) 0.25 % IJ SOLN
INTRAMUSCULAR | Status: DC | PRN
  Administered 2015-11-28: 10 mL

## 2015-11-28 MED ORDER — GABAPENTIN 300 MG PO CAPS
300.0000 mg | ORAL_CAPSULE | ORAL | Status: AC
  Administered 2015-11-28: 300 mg via ORAL

## 2015-11-28 MED ORDER — LACTATED RINGERS IV SOLN
INTRAVENOUS | Status: DC
  Administered 2015-11-28: 10:00:00 via INTRAVENOUS

## 2015-11-28 MED ORDER — FENTANYL CITRATE (PF) 100 MCG/2ML IJ SOLN
INTRAMUSCULAR | Status: AC
  Filled 2015-11-28: qty 2

## 2015-11-28 MED ORDER — SCOPOLAMINE 1 MG/3DAYS TD PT72: 1.0000 | MEDICATED_PATCH | Freq: Once | TRANSDERMAL | Status: DC | PRN

## 2015-11-28 MED ORDER — LIDOCAINE 2% (20 MG/ML) 5 ML SYRINGE
INTRAMUSCULAR | Status: DC | PRN
  Administered 2015-11-28: 80 mg via INTRAVENOUS

## 2015-11-28 MED ORDER — CIPROFLOXACIN IN D5W 400 MG/200ML IV SOLN
INTRAVENOUS | Status: AC
  Filled 2015-11-28: qty 200

## 2015-11-28 MED ORDER — LIDOCAINE 2% (20 MG/ML) 5 ML SYRINGE
INTRAMUSCULAR | Status: AC
  Filled 2015-11-28: qty 5

## 2015-11-28 MED ORDER — FENTANYL CITRATE (PF) 100 MCG/2ML IJ SOLN: 25.0000 ug | INTRAMUSCULAR | Status: DC | PRN

## 2015-11-28 MED ORDER — MIDAZOLAM HCL 2 MG/2ML IJ SOLN: 1.0000 mg | INTRAMUSCULAR | Status: DC | PRN

## 2015-11-28 MED ORDER — PROMETHAZINE HCL 25 MG/ML IJ SOLN: 6.2500 mg | INTRAMUSCULAR | Status: DC | PRN

## 2015-11-28 MED ORDER — CIPROFLOXACIN IN D5W 400 MG/200ML IV SOLN
400.0000 mg | INTRAVENOUS | Status: AC
  Administered 2015-11-28: 400 mg via INTRAVENOUS

## 2015-11-28 SURGICAL SUPPLY — 51 items
BINDER BREAST LRG (GAUZE/BANDAGES/DRESSINGS) IMPLANT
BINDER BREAST MEDIUM (GAUZE/BANDAGES/DRESSINGS) IMPLANT
BINDER BREAST XLRG (GAUZE/BANDAGES/DRESSINGS) ×2 IMPLANT
BINDER BREAST XXLRG (GAUZE/BANDAGES/DRESSINGS) IMPLANT
BLADE SURG 15 STRL LF DISP TIS (BLADE) ×1 IMPLANT
BLADE SURG 15 STRL SS (BLADE) ×2
CANISTER SUC SOCK COL 7IN (MISCELLANEOUS) IMPLANT
CANISTER SUCT 1200ML W/VALVE (MISCELLANEOUS) IMPLANT
CHLORAPREP W/TINT 26ML (MISCELLANEOUS) ×2 IMPLANT
CLIP TI WIDE RED SMALL 6 (CLIP) ×2 IMPLANT
COVER BACK TABLE 60X90IN (DRAPES) ×2 IMPLANT
COVER MAYO STAND STRL (DRAPES) ×2 IMPLANT
COVER PROBE W GEL 5X96 (DRAPES) ×2 IMPLANT
DECANTER SPIKE VIAL GLASS SM (MISCELLANEOUS) IMPLANT
DEVICE DUBIN W/COMP PLATE 8390 (MISCELLANEOUS) ×2 IMPLANT
DRAPE LAPAROSCOPIC ABDOMINAL (DRAPES) ×2 IMPLANT
DRAPE UTILITY XL STRL (DRAPES) ×2 IMPLANT
DRSG TEGADERM 4X4.75 (GAUZE/BANDAGES/DRESSINGS) IMPLANT
ELECT COATED BLADE 2.86 ST (ELECTRODE) ×2 IMPLANT
ELECT REM PT RETURN 9FT ADLT (ELECTROSURGICAL) ×2
ELECTRODE REM PT RTRN 9FT ADLT (ELECTROSURGICAL) ×1 IMPLANT
GLOVE BIO SURGEON STRL SZ7 (GLOVE) ×4 IMPLANT
GLOVE BIOGEL PI IND STRL 7.5 (GLOVE) ×1 IMPLANT
GLOVE BIOGEL PI INDICATOR 7.5 (GLOVE) ×1
GOWN STRL REUS W/ TWL LRG LVL3 (GOWN DISPOSABLE) ×2 IMPLANT
GOWN STRL REUS W/TWL LRG LVL3 (GOWN DISPOSABLE) ×4
ILLUMINATOR WAVEGUIDE N/F (MISCELLANEOUS) ×2 IMPLANT
KIT MARKER MARGIN INK (KITS) ×2 IMPLANT
LIGHT WAVEGUIDE WIDE FLAT (MISCELLANEOUS) IMPLANT
LIQUID BAND (GAUZE/BANDAGES/DRESSINGS) ×2 IMPLANT
NEEDLE HYPO 25X1 1.5 SAFETY (NEEDLE) ×2 IMPLANT
NS IRRIG 1000ML POUR BTL (IV SOLUTION) IMPLANT
PACK BASIN DAY SURGERY FS (CUSTOM PROCEDURE TRAY) ×2 IMPLANT
PENCIL BUTTON HOLSTER BLD 10FT (ELECTRODE) ×2 IMPLANT
SLEEVE SCD COMPRESS KNEE MED (MISCELLANEOUS) ×2 IMPLANT
SPONGE GAUZE 4X4 12PLY STER LF (GAUZE/BANDAGES/DRESSINGS) IMPLANT
SPONGE LAP 4X18 X RAY DECT (DISPOSABLE) ×2 IMPLANT
STRIP CLOSURE SKIN 1/2X4 (GAUZE/BANDAGES/DRESSINGS) ×2 IMPLANT
SUT MNCRL AB 4-0 PS2 18 (SUTURE) ×2 IMPLANT
SUT MON AB 5-0 PS2 18 (SUTURE) ×2 IMPLANT
SUT SILK 2 0 SH (SUTURE) IMPLANT
SUT VIC AB 2-0 SH 27 (SUTURE) ×2
SUT VIC AB 2-0 SH 27XBRD (SUTURE) ×1 IMPLANT
SUT VIC AB 3-0 SH 27 (SUTURE) ×2
SUT VIC AB 3-0 SH 27X BRD (SUTURE) ×1 IMPLANT
SUT VIC AB 5-0 PS2 18 (SUTURE) IMPLANT
SYR CONTROL 10ML LL (SYRINGE) ×2 IMPLANT
TOWEL OR 17X24 6PK STRL BLUE (TOWEL DISPOSABLE) ×2 IMPLANT
TOWEL OR NON WOVEN STRL DISP B (DISPOSABLE) ×2 IMPLANT
TUBE CONNECTING 20X1/4 (TUBING) IMPLANT
YANKAUER SUCT BULB TIP NO VENT (SUCTIONS) IMPLANT

## 2015-11-28 NOTE — Interval H&P Note (Signed)
History and Physical Interval Note:  11/28/2015 10:40 AM  Lisa Greene  has presented today for surgery, with the diagnosis of LEFT BREAST CANCER  The various methods of treatment have been discussed with the patient and family. After consideration of risks, benefits and other options for treatment, the patient has consented to  Procedure(s): LEFT BREAST LUMPECTOMY WITH BRACKETED RADIOACTIVE SEED LOCALIZATION (Left) as a surgical intervention .  The patient's history has been reviewed, patient examined, no change in status, stable for surgery.  I have reviewed the patient's chart and labs.  Questions were answered to the patient's satisfaction.     [name]

## 2015-11-28 NOTE — Op Note (Signed)
Preoperative diagnosis:left breast cancer, stage 0 Postoperative diagnosis: same as above Procedure: Left breast seed bracketed lumpectomy Surgeon Dr Serita Grammes Anes general  EBL: minimal Comps none Specimen: left breast tissue marked with paint Sponge count correct at completion dispo to recovery stable  Indications: This is a 14 yof who presents with about a 4 cm area of calcifications and dcis.  We discussed all options and elected to proceed with seed guided bracketed lumpectomy. She had two seeds placed prior to beginning in the OR. I had her mammograms available in the OR>   Procedure: After informed consent was obtained the patient was taken to the OR. She was injected with technetium in the standard periareolar fashion.She was given abx and  SCDs were in place. She was prepped and draped in the standard sterile surgical fashion. A timeout was performed. One of the seeds was immediately under the skin. I elected to remove an ellipse of skin to get clear margins.  I then created flaps and then went down to the pectoralis muscle.I was able to easily identify both seeds and  I then removed this entire area to include both seeds and the intervening tissue. I did confirm removal of the seeds with mammography. This was confirmed by radiology. The removal of both seeds was also confirmed by pathology. I placed clips in the cavity. I then closed this with 2-0 vicryl, 3-0 vicryl and 4-0 monocryl. I placed glue on this wound. A binder was placed, she was awakened and transferred to recovery stable.

## 2015-11-28 NOTE — Anesthesia Procedure Notes (Signed)
Procedure Name: LMA Insertion Date/Time: 11/28/2015 10:51 AM Performed by: Lieutenant Diego Pre-anesthesia Checklist: Patient identified, Emergency Drugs available, Suction available and Patient being monitored Patient Re-evaluated:Patient Re-evaluated prior to inductionOxygen Delivery Method: Circle system utilized Preoxygenation: Pre-oxygenation with 100% oxygen Intubation Type: IV induction Ventilation: Mask ventilation without difficulty LMA: LMA inserted LMA Size: 4.0 Number of attempts: 1 Airway Equipment and Method: Bite block Placement Confirmation: positive ETCO2 and breath sounds checked- equal and bilateral Tube secured with: Tape Dental Injury: Teeth and Oropharynx as per pre-operative assessment

## 2015-11-28 NOTE — Anesthesia Postprocedure Evaluation (Signed)
Anesthesia Post Note  Patient: Lisa Greene  Procedure(s) Performed: Procedure(s) (LRB): LEFT BREAST LUMPECTOMY WITH BRACKETED RADIOACTIVE SEED LOCALIZATION (Left)  Patient location during evaluation: PACU Anesthesia Type: General Level of consciousness: awake and alert Pain management: pain level controlled Vital Signs Assessment: post-procedure vital signs reviewed and stable Respiratory status: spontaneous breathing, nonlabored ventilation, respiratory function stable and patient connected to nasal cannula oxygen Cardiovascular status: blood pressure returned to baseline and stable Postop Assessment: no signs of nausea or vomiting Anesthetic complications: no    Last Vitals:  Filed Vitals:   11/28/15 1230 11/28/15 1245  BP: 140/63 148/60  Pulse: 65 60  Temp:  36.4 C  Resp: 19 19    Last Pain:  Filed Vitals:   11/28/15 1251  PainSc: 2                  Patti Shorb J

## 2015-11-28 NOTE — Interval H&P Note (Signed)
History and Physical Interval Note:  11/28/2015 10:34 AM  Lisa Greene  has presented today for surgery, with the diagnosis of LEFT BREAST CANCER  The various methods of treatment have been discussed with the patient and family. After consideration of risks, benefits and other options for treatment, the patient has consented to  Procedure(s): LEFT BREAST LUMPECTOMY WITH BRACKETED RADIOACTIVE SEED LOCALIZATION (Left) as a surgical intervention .  The patient's history has been reviewed, patient examined, no change in status, stable for surgery.  I have reviewed the patient's chart and labs.  Questions were answered to the patient's satisfaction.     Reford Olliff

## 2015-11-28 NOTE — Discharge Instructions (Signed)
[facility] °Office Phone Number [phone] ° °POST OP INSTRUCTIONS ° °Always review your discharge instruction sheet given to you by the facility where your surgery was performed. ° °IF YOU HAVE DISABILITY OR FAMILY LEAVE FORMS, YOU MUST BRING THEM TO THE OFFICE FOR PROCESSING.  DO NOT GIVE THEM TO YOUR DOCTOR. ° °1. A prescription for pain medication may be given to you upon discharge.  Take your pain medication as prescribed, if needed.  If narcotic pain medicine is not needed, then you may take acetaminophen (Tylenol), naprosyn (Alleve) or ibuprofen (Advil) as needed. °2. Take your usually prescribed medications unless otherwise directed °3. If you need a refill on your pain medication, please contact your pharmacy.  They will contact our office to request authorization.  Prescriptions will not be filled after 5pm or on week-ends. °4. You should eat very light the first 24 hours after surgery, such as soup, crackers, pudding, etc.  Resume your normal diet the day after surgery. °5. Most patients will experience some swelling and bruising in the breast.  Ice packs and a good support bra will help.  Wear the breast binder provided or a sports bra for 72 hours day and night.  After that wear a sports bra during the day until you return to the office. Swelling and bruising can take several days to resolve.  °6. It is common to experience some constipation if taking pain medication after surgery.  Increasing fluid intake and taking a stool softener will usually help or prevent this problem from occurring.  A mild laxative (Milk of Magnesia or Miralax) should be taken according to package directions if there are no bowel movements after 48 hours. °7. Unless discharge instructions indicate otherwise, you may remove your bandages 48 hours after surgery and you may shower at that time.  You may have steri-strips (small skin tapes) in place directly over the incision.  These strips should be left on the  skin for 7-10 days and will come off on their own.  If your surgeon used skin glue on the incision, you may shower in 24 hours.  The glue will flake off over the next 2-3 weeks.  Any sutures or staples will be removed at the office during your follow-up visit. °8. ACTIVITIES:  You may resume regular daily activities (gradually increasing) beginning the next day.  Wearing a good support bra or sports bra minimizes pain and swelling.  You may have sexual intercourse when it is comfortable. °a. You may drive when you no longer are taking prescription pain medication, you can comfortably wear a seatbelt, and you can safely maneuver your car and apply brakes. °b. RETURN TO WORK:  ______________________________________________________________________________________ °9. You should see your doctor in the office for a follow-up appointment approximately two weeks after your surgery.  Your doctor’s nurse will typically make your follow-up appointment when she calls you with your pathology report.  Expect your pathology report 3-4 business days after your surgery.  You may call to check if you do not hear from us after three days. °10. OTHER INSTRUCTIONS: _______________________________________________________________________________________________ _____________________________________________________________________________________________________________________________________ °_____________________________________________________________________________________________________________________________________ °_____________________________________________________________________________________________________________________________________ ° °WHEN TO CALL DR WAKEFIELD: °1. Fever over 101.0 °2. Nausea and/or vomiting. °3. Extreme swelling or bruising. °4. Continued bleeding from incision. °5. Increased pain, redness, or drainage from the incision. ° °The clinic staff is available to answer your questions during regular  business hours.  Please don’t hesitate to call and ask to speak to one of the nurses for clinical concerns.  If   you have a medical emergency, go to the nearest emergency room or call 911.  A surgeon from [facility] is always on call at the hospital. ° °For further questions, please visit [email or web address] ° ° ° °Post Anesthesia Home Care Instructions ° °Activity: °Get plenty of rest for the remainder of the day. A responsible adult should stay with you for 24 hours following the procedure.  °For the next 24 hours, DO NOT: °-Drive a car °-Operate machinery °-Drink alcoholic beverages °-Take any medication unless instructed by your physician °-Make any legal decisions or sign important papers. ° °Meals: °Start with liquid foods such as gelatin or soup. Progress to regular foods as tolerated. Avoid greasy, spicy, heavy foods. If nausea and/or vomiting occur, drink only clear liquids until the nausea and/or vomiting subsides. Call your physician if vomiting continues. ° °Special Instructions/Symptoms: °Your throat may feel dry or sore from the anesthesia or the breathing tube placed in your throat during surgery. If this causes discomfort, gargle with warm salt water. The discomfort should disappear within 24 hours. ° °If you had a scopolamine patch placed behind your ear for the management of post- operative nausea and/or vomiting: ° °1. The medication in the patch is effective for 72 hours, after which it should be removed.  Wrap patch in a tissue and discard in the trash. Wash hands thoroughly with soap and water. °2. You may remove the patch earlier than 72 hours if you experience unpleasant side effects which may include dry mouth, dizziness or visual disturbances. °3. Avoid touching the patch. Wash your hands with soap and water after contact with the patch. °  ° °

## 2015-11-28 NOTE — H&P (Signed)
68 yof who has history of right breast excisional biopsies by Dr Marylene Buerger is past, she has no fh. underwent routine screening mm that shows screening detected left breast calcifications. the calcs are in left upper inner quadrant and measure 4.1 cm. the axilla is negative by [place]. core biopsy of this area was done and is dics with csl that is int grade, er pos at 95%, pr pos at 80%. she has no complaints referable to either breast.   Other Problems Tawni Pummel, RN; 11/06/2015 7:53 AM) Asthma Back Pain Heart murmur High blood pressure  Past Surgical History Tawni Pummel, RN; 11/06/2015 7:53 AM) Breast Biopsy Bilateral. Knee Surgery Left. Thyroid Surgery  Diagnostic Studies History Tawni Pummel, RN; 11/06/2015 7:53 AM) Colonoscopy within last year Mammogram within last year Pap Smear >5 years ago  Medication History Tawni Pummel, RN; 11/06/2015 7:53 AM) Medications Reconciled  Social History Tawni Pummel, RN; 11/06/2015 7:53 AM) No alcohol use No caffeine use No drug use Tobacco use Never smoker.  Family History Tawni Pummel, RN; 11/06/2015 7:53 AM) Arthritis Brother, Mother. Cancer Mother. Hypertension Father. Melanoma Brother. Respiratory Condition Father.  Pregnancy / Birth History Tawni Pummel, RN; 11/06/2015 7:53 AM) Age at menarche 62 years. Age of menopause 11-50 Contraceptive History Oral contraceptives. Gravida 0    Review of Systems Sunday Spillers Ledford RN; 11/06/2015 7:53 AM) General Not Present- Appetite Loss, Chills, Fatigue, Fever, Night Sweats, Weight Gain and Weight Loss. Skin Not Present- Change in Wart/Mole, Dryness, Hives, Jaundice, New Lesions, Non-Healing Wounds, Rash and Ulcer. HEENT Present- Hoarseness, Seasonal Allergies and Wears glasses/contact lenses. Not Present- Earache, Hearing Loss, Nose Bleed, Oral Ulcers, Ringing in the Ears, Sinus Pain, Sore Throat, Visual Disturbances and Yellow Eyes. Respiratory Present-  Snoring. Not Present- Bloody sputum, Chronic Cough, Difficulty Breathing and Wheezing. Breast Not Present- Breast Mass, Breast Pain, Nipple Discharge and Skin Changes. Cardiovascular Present- Rapid Heart Rate. Not Present- Chest Pain, Difficulty Breathing Lying Down, Leg Cramps, Palpitations, Shortness of Breath and Swelling of Extremities. Gastrointestinal Not Present- Abdominal Pain, Bloating, Bloody Stool, Change in Bowel Habits, Chronic diarrhea, Constipation, Difficulty Swallowing, Excessive gas, Gets full quickly at meals, Hemorrhoids, Indigestion, Nausea, Rectal Pain and Vomiting. Female Genitourinary Not Present- Frequency, Nocturia, Painful Urination, Pelvic Pain and Urgency. Musculoskeletal Present- Back Pain and Muscle Pain. Not Present- Joint Pain, Joint Stiffness, Muscle Weakness and Swelling of Extremities. Neurological Not Present- Decreased Memory, Fainting, Headaches, Numbness, Seizures, Tingling, Tremor, Trouble walking and Weakness. Psychiatric Not Present- Anxiety, Bipolar, Change in Sleep Pattern, Depression, Fearful and Frequent crying. Endocrine Not Present- Cold Intolerance, Excessive Hunger, Hair Changes, Heat Intolerance, Hot flashes and New Diabetes. Hematology Not Present- Easy Bruising, Excessive bleeding, Gland problems, HIV and Persistent Infections.   Physical Exam Rolm Bookbinder MD; 11/09/2015 3:24 PM) General Mental Status-Alert. Orientation-Oriented X3.  Chest and Lung Exam Chest and lung exam reveals -on auscultation, normal breath sounds, no adventitious sounds and normal vocal resonance.  Breast Nipples-No Discharge. Breast Lump-No Palpable Breast Mass.  Cardiovascular Cardiovascular examination reveals -normal heart sounds, regular rate and rhythm with no murmurs.  Lymphatic Head & Neck  General Head & Neck Lymphatics: Bilateral - Description - Normal. Axillary  General Axillary Region: Bilateral - Description - Normal. Note: no  [place] adenopathy   Assessment & Plan Rolm Bookbinder MD; 11/09/2015 3:26 PM) DUCTAL CARCINOMA IN SITU (DCIS) OF LEFT BREAST (D05.12) Story: Left breast seed guided bracketed lumpectomy We discussed the staging and pathophysiology of breast cancer. We discussed all of the different options for treatment  for breast cancer including surgery, chemotherapy, radiation therapy, Herceptin, and antiestrogen therapy. We discussed the options for treatment of the breast cancer which included lumpectomy versus a mastectomy. We discussed the performance of the lumpectomy with radioactive seed placement. We discussed a 5-10% chance of a positive margin requiring reexcision in the operating room. We also discussed that she will need radiation therapy if she undergoes lumpectomy. We discussed the mastectomy (removal of whole breast) and the postoperative care for that as well. Mastectomy can be followed by reconstruction. The decision for lumpectomy vs mastectomy has no impact on decision for chemotherapy. Most mastectomy patients will not need radiation therapy. We discussed that there is no difference in her survival whether she undergoes lumpectomy with radiation therapy or antiestrogen therapy versus a mastectomy. There is also no real difference between her recurrence in the breast. We discussed the risks of operation including bleeding, infection, possible reoperation. She understands her further therapy will be based on what her stages at the time of her operation.

## 2015-11-28 NOTE — Transfer of Care (Signed)
Immediate Anesthesia Transfer of Care Note  Patient: Lisa Greene  Procedure(s) Performed: Procedure(s): LEFT BREAST LUMPECTOMY WITH BRACKETED RADIOACTIVE SEED LOCALIZATION (Left)  Patient Location: PACU  Anesthesia Type:General  Level of Consciousness: sedated  Airway & Oxygen Therapy: Patient Spontanous Breathing and Patient connected to face mask oxygen  Post-op Assessment: Report given to RN and Post -op Vital signs reviewed and stable  Post vital signs: Reviewed and stable  Last Vitals:  Filed Vitals:   11/28/15 1000  BP: 153/62  Pulse: 71  Temp: 36.6 C  Resp: 18    Last Pain: There were no vitals filed for this visit.       Complications: No apparent anesthesia complications

## 2015-11-28 NOTE — Anesthesia Preprocedure Evaluation (Addendum)
Anesthesia Evaluation  Patient identified by MRN, date of birth, ID band Patient awake    Reviewed: Allergy & Precautions, NPO status , Patient's Chart, lab work & pertinent test results  Airway Mallampati: II  TM Distance: >3 FB Neck ROM: Full    Dental no notable dental hx.    Pulmonary asthma ,    Pulmonary exam normal breath sounds clear to auscultation       Cardiovascular hypertension, Pt. on medications and Pt. on home beta blockers Normal cardiovascular exam+ Valvular Problems/Murmurs  Rhythm:Regular Rate:Normal     Neuro/Psych negative neurological ROS  negative psych ROS   GI/Hepatic negative GI ROS, Neg liver ROS,   Endo/Other  negative endocrine ROS  Renal/GU Renal diseaseCKD stage 3. Last GFR is 47  negative genitourinary   Musculoskeletal negative musculoskeletal ROS (+)   Abdominal   Peds negative pediatric ROS (+)  Hematology negative hematology ROS (+)   Anesthesia Other Findings   Reproductive/Obstetrics negative OB ROS                           Anesthesia Physical Anesthesia Plan  ASA: III  Anesthesia Plan: General   Post-op Pain Management:    Induction: Intravenous  Airway Management Planned: LMA  Additional Equipment:   Intra-op Plan:   Post-operative Plan: Extubation in OR  Informed Consent: I have reviewed the patients History and Physical, chart, labs and discussed the procedure including the risks, benefits and alternatives for the proposed anesthesia with the patient or authorized representative who has indicated his/her understanding and acceptance.   Dental advisory given  Plan Discussed with: CRNA  Anesthesia Plan Comments:         Anesthesia Quick Evaluation

## 2015-11-29 ENCOUNTER — Encounter (HOSPITAL_BASED_OUTPATIENT_CLINIC_OR_DEPARTMENT_OTHER): Payer: Self-pay | Admitting: General Surgery

## 2015-12-05 ENCOUNTER — Other Ambulatory Visit: Payer: Self-pay | Admitting: General Surgery

## 2015-12-05 ENCOUNTER — Encounter: Payer: Self-pay | Admitting: Family Medicine

## 2015-12-05 ENCOUNTER — Ambulatory Visit (INDEPENDENT_AMBULATORY_CARE_PROVIDER_SITE_OTHER): Payer: Medicare HMO | Admitting: Family Medicine

## 2015-12-05 VITALS — BP 110/70 | HR 72 | Temp 98.3°F | Ht 62.0 in | Wt 153.0 lb

## 2015-12-05 DIAGNOSIS — C50312 Malignant neoplasm of lower-inner quadrant of left female breast: Secondary | ICD-10-CM

## 2015-12-05 DIAGNOSIS — M79662 Pain in left lower leg: Secondary | ICD-10-CM

## 2015-12-05 NOTE — Patient Instructions (Signed)
Take xarelto twice a day with meals  This is to treat blood clot if there is one  Could just be a calf strain but with your history we have to be sure  We will try to get the doppler (ultrasound of leg) tomorrow. We will call you about this

## 2015-12-05 NOTE — Progress Notes (Signed)
Subjective:  Lisa Greene is a 75 y.o. year old very pleasant female patient who presents for/with See problem oriented charting ROS- see any ROS included in HPI as well.   Past Medical History-  Patient Active Problem List   Diagnosis Date Noted  . CKD (chronic kidney disease), stage III 10/22/2015    Priority: Medium  . Asthma, moderate persistent, well-controlled 04/19/2014    Priority: Medium  . Hypertension 11/25/2010    Priority: Medium  . Hyperlipidemia 12/20/2006    Priority: Medium  . Family history of abdominal aortic aneurysm 03/08/2015    Priority: Low  . Eosinophilia 02/08/2009    Priority: Low  . Allergic rhinitis 01/31/2008    Priority: Low  . Osteopenia 12/20/2006    Priority: Low  . Breast cancer of lower-inner quadrant of left female breast ([place]) 11/01/2015    Medications- reviewed and updated Current Outpatient Prescriptions  Medication Sig Dispense Refill  . Albuterol Sulfate (PROAIR RESPICLICK) 123XX123 (90 BASE) MCG/ACT AEPB Inhale 1 puff into the lungs every 8 (eight) hours as needed. 1 each 0  . Calcium-Vitamin D-Vitamin K (VIACTIV PO) Take by mouth.    . cholecalciferol (VITAMIN D) 1000 UNITS tablet Take 2,000 Units by mouth daily.    . Docosanol (ABREVA EX) Apply topically 3 (three) times daily.    . fluticasone (FLONASE) 50 MCG/ACT nasal spray Place 1 spray into both nostrils daily as needed.     . fluticasone furoate-vilanterol (BREO ELLIPTA) 100-25 MCG/INH AEPB Inhale 1 puff into the lungs daily.    . hydrochlorothiazide (HYDRODIURIL) 25 MG tablet TAKE 1 TABLET (25 MG TOTAL) BY MOUTH DAILY. 90 tablet 3  . HYDROcodone-acetaminophen (NORCO) 10-325 MG tablet Take 1 tablet by mouth every 6 (six) hours as needed. 20 tablet 0  . losartan (COZAAR) 100 MG tablet TAKE 1 TABLET (100 MG TOTAL) BY MOUTH DAILY. 30 tablet 4  . metoprolol succinate (TOPROL-XL) 25 MG 24 hr tablet TAKE 1 TABLET (25 MG TOTAL) BY MOUTH DAILY. 90 tablet 3  . montelukast (SINGULAIR) 10 MG  tablet Take 1 tablet by mouth at bedtime.    . niacin 500 MG tablet Take 500 mg by mouth at bedtime.    . [DISCONTINUED] albuterol (PROVENTIL) 90 MCG/ACT inhaler Inhale 2 puffs into the lungs every 6 (six) hours as needed for wheezing. 17 g 0  . [DISCONTINUED] pantoprazole (PROTONIX) 40 MG tablet Take 1 tablet by mouth daily.     No current facility-administered medications for this visit.    Objective: BP 110/70 mmHg  Pulse 72  Temp(Src) 98.3 F (36.8 C) (Oral)  Ht 5\' 2"  (1.575 m)  Wt 153 lb (69.4 kg)  BMI 27.98 kg/m2 Gen: NAD, resting comfortably CV: RRR no murmurs rubs or gallops Lungs: CTAB no crackles, wheeze, rhonchi Ext: 10 cm below tibial plateau right leg 34 cm, left leg 35.5 cm. Negative homans sign. No pitting edeam Skin: warm, dry, no rash Neuro: grossly normal, moves all extremities  Assessment/Plan:  Calf pain, left - Plan: VAS [place] LOWER EXTREMITY VENOUS (DVT) S: Patient started with left calf pain on Saturday. Of note she had breast cancer surgery last month and was less mobile recently. States mild toothache like pain sometimes goes up to knee and sometimes down leg but mainly in calf. Does not hurt getting on toes. Does not always hurt to walk but sometimes it does. Not worsening. Mild swelling in left leg.  ROS- no chest pain, shortness of breath, no pitting edema. No headache.  A/P: will get duplex tomorrow AM, given high risk of DVT with recent surgery and cancer- will cover with xarelto 15mg  BID until scan completed. Samples were given today. She also states her left eye vision is slightly off and has optho appt in AM. This could simply be a muscle strain but must rule out given level of risk.    New acute issue in high risk patient for DVT. Medication management.   Return precautions advised.  Garret Reddish, MD

## 2015-12-06 ENCOUNTER — Telehealth: Payer: Self-pay

## 2015-12-06 ENCOUNTER — Ambulatory Visit (HOSPITAL_COMMUNITY)
Admission: RE | Admit: 2015-12-06 | Discharge: 2015-12-06 | Disposition: A | Discharge status: Home / Self Care | Payer: Medicare HMO | Source: Ambulatory Visit | Attending: Family Medicine | Admitting: Family Medicine

## 2015-12-06 DIAGNOSIS — E785 Hyperlipidemia, unspecified: Secondary | ICD-10-CM | POA: Insufficient documentation

## 2015-12-06 DIAGNOSIS — H26492 Other secondary cataract, left eye: Secondary | ICD-10-CM | POA: Diagnosis not present

## 2015-12-06 DIAGNOSIS — C50919 Malignant neoplasm of unspecified site of unspecified female breast: Secondary | ICD-10-CM | POA: Insufficient documentation

## 2015-12-06 DIAGNOSIS — M79662 Pain in left lower leg: Secondary | ICD-10-CM | POA: Diagnosis not present

## 2015-12-06 DIAGNOSIS — I10 Essential (primary) hypertension: Secondary | ICD-10-CM | POA: Insufficient documentation

## 2015-12-06 NOTE — Telephone Encounter (Signed)
Received a call from [facility] at [facility]. Patient had a bilateral venus duplex and she states both legs are negative. Report given to Dr. Yong Channel and patient informed she can stop taking Xarelto. Patient to call with any questions or concerns.

## 2015-12-11 ENCOUNTER — Telehealth: Payer: Self-pay | Admitting: Family Medicine

## 2015-12-11 NOTE — Telephone Encounter (Signed)
Pt would like for you to know she is feeling better

## 2015-12-12 DIAGNOSIS — R921 Mammographic calcification found on diagnostic imaging of breast: Secondary | ICD-10-CM | POA: Diagnosis not present

## 2015-12-12 DIAGNOSIS — Z09 Encounter for follow-up examination after completed treatment for conditions other than malignant neoplasm: Secondary | ICD-10-CM | POA: Diagnosis not present

## 2015-12-12 NOTE — Telephone Encounter (Signed)
Great! Thanks for update.  

## 2015-12-13 ENCOUNTER — Encounter: Payer: Self-pay | Admitting: Hematology and Oncology

## 2015-12-13 ENCOUNTER — Telehealth: Payer: Self-pay | Admitting: Hematology and Oncology

## 2015-12-13 ENCOUNTER — Ambulatory Visit (HOSPITAL_BASED_OUTPATIENT_CLINIC_OR_DEPARTMENT_OTHER): Payer: Medicare HMO | Admitting: Hematology and Oncology

## 2015-12-13 VITALS — BP 135/60 | HR 71 | Temp 98.9°F | Resp 18 | Ht 62.0 in | Wt 153.0 lb

## 2015-12-13 DIAGNOSIS — D0512 Intraductal carcinoma in situ of left breast: Secondary | ICD-10-CM

## 2015-12-13 DIAGNOSIS — C50312 Malignant neoplasm of lower-inner quadrant of left female breast: Secondary | ICD-10-CM

## 2015-12-13 NOTE — Progress Notes (Signed)
Patient Care Team: Marin Olp, MD as PCP - General (Family Medicine) Rolm Bookbinder, MD as Consulting Physician (General Surgery) Nicholas Lose, MD as Consulting Physician (Hematology and Oncology) Kyung Rudd, MD as Consulting Physician (Radiation Oncology)  DIAGNOSIS: Breast cancer of lower-inner quadrant of left female breast [facility])   Staging form: Breast, AJCC 7th Edition     Clinical stage from 11/06/2015: Stage 0 (Tis (DCIS), N0, M0) - Unsigned       Staging comments: Staged at breast conference on 6.7.17  SUMMARY OF ONCOLOGIC HISTORY:   Breast cancer of lower-inner quadrant of left female breast ([place])   10/30/2015 Initial Diagnosis Left breast biopsy: DCIS intermediate grade with calcifications (micropapillary architecture), fibrocystic changes with adenosis, complex sclerosing lesion, UDH, ER 95%, PR 80%; mammogram in ultrasound showed right breast calcifications 4.1 cm   11/28/2015 Surgery Left lumpectomy: DCIS with calcifications, high-grade, 1.3 cm, focally less than 0.1 cm to posterior margin and focally less than 0.1 cm lateral margin, ER 95%, PR 80%, TisN0 stage 0    CHIEF COMPLIANT: Follow-up after recent lumpectomy  INTERVAL HISTORY: Lisa Greene is a 75 year old with above-mentioned history of left breast DCIS who underwent lumpectomy and is here today to discuss the final pathology report. She is recovering very well from recent surgery. She reports mild discomfort but not requiring any medications.  REVIEW OF SYSTEMS:   Constitutional: Denies fevers, chills or abnormal weight loss Eyes: Denies blurriness of vision Ears, nose, mouth, throat, and face: Denies mucositis or sore throat Respiratory: Denies cough, dyspnea or wheezes Cardiovascular: Denies palpitation, chest discomfort Gastrointestinal:  Denies nausea, heartburn or change in bowel habits Skin: Denies abnormal skin rashes Lymphatics: Denies new lymphadenopathy or easy bruising Neurological:Denies  numbness, tingling or new weaknesses Behavioral/Psych: Mood is stable, no new changes  Extremities: No lower extremity edema Breast: Recent right lumpectomy All other systems were reviewed with the patient and are negative.  I have reviewed the past medical history, past surgical history, social history and family history with the patient and they are unchanged from previous note.  ALLERGIES:  is allergic to lisinopril; penicillins; pork-derived products; and sulfonamide derivatives.  MEDICATIONS:  Current Outpatient Prescriptions  Medication Sig Dispense Refill  . Albuterol Sulfate (PROAIR RESPICLICK) 123XX123 (90 BASE) MCG/ACT AEPB Inhale 1 puff into the lungs every 8 (eight) hours as needed. 1 each 0  . Calcium-Vitamin D-Vitamin K (VIACTIV PO) Take by mouth.    . cholecalciferol (VITAMIN D) 1000 UNITS tablet Take 2,000 Units by mouth daily.    . Docosanol (ABREVA EX) Apply topically 3 (three) times daily.    . fluticasone (FLONASE) 50 MCG/ACT nasal spray Place 1 spray into both nostrils daily as needed.     . fluticasone furoate-vilanterol (BREO ELLIPTA) 100-25 MCG/INH AEPB Inhale 1 puff into the lungs daily.    . hydrochlorothiazide (HYDRODIURIL) 25 MG tablet TAKE 1 TABLET (25 MG TOTAL) BY MOUTH DAILY. 90 tablet 3  . HYDROcodone-acetaminophen (NORCO) 10-325 MG tablet Take 1 tablet by mouth every 6 (six) hours as needed. 20 tablet 0  . losartan (COZAAR) 100 MG tablet TAKE 1 TABLET (100 MG TOTAL) BY MOUTH DAILY. 30 tablet 4  . metoprolol succinate (TOPROL-XL) 25 MG 24 hr tablet TAKE 1 TABLET (25 MG TOTAL) BY MOUTH DAILY. 90 tablet 3  . montelukast (SINGULAIR) 10 MG tablet Take 1 tablet by mouth at bedtime.    . niacin 500 MG tablet Take 500 mg by mouth at bedtime.    . [DISCONTINUED]  albuterol (PROVENTIL) 90 MCG/ACT inhaler Inhale 2 puffs into the lungs every 6 (six) hours as needed for wheezing. 17 g 0  . [DISCONTINUED] pantoprazole (PROTONIX) 40 MG tablet Take 1 tablet by mouth daily.      No current facility-administered medications for this visit.    PHYSICAL EXAMINATION: ECOG PERFORMANCE STATUS: 1 - Symptomatic but completely ambulatory  There were no vitals filed for this visit. There were no vitals filed for this visit.  GENERAL:alert, no distress and comfortable SKIN: skin color, texture, turgor are normal, no rashes or significant lesions EYES: normal, Conjunctiva are pink and non-injected, sclera clear OROPHARYNX:no exudate, no erythema and lips, buccal mucosa, and tongue normal  NECK: supple, thyroid normal size, non-tender, without nodularity LYMPH:  no palpable lymphadenopathy in the cervical, axillary or inguinal LUNGS: clear to auscultation and percussion with normal breathing effort HEART: regular rate & rhythm and no murmurs and no lower extremity edema ABDOMEN:abdomen soft, non-tender and normal bowel sounds MUSCULOSKELETAL:no cyanosis of digits and no clubbing  NEURO: alert & oriented x 3 with fluent speech, no focal motor/sensory deficits EXTREMITIES: No lower extremity edema   LABORATORY DATA:  I have reviewed the data as listed   Chemistry      Component Value Date/Time   NA 137 11/06/2015 1219   NA 133* 10/15/2015 0818   K 4.3 11/06/2015 1219   K 3.6 10/15/2015 0818   CL 99 10/15/2015 0818   CO2 26 11/06/2015 1219   CO2 25 10/15/2015 0818   BUN 12.8 11/06/2015 1219   BUN 18 10/15/2015 0818   CREATININE 1.1 11/06/2015 1219   CREATININE 1.20 10/15/2015 0818      Component Value Date/Time   CALCIUM 9.9 11/06/2015 1219   CALCIUM 9.0 10/15/2015 0818   ALKPHOS 63 11/06/2015 1219   ALKPHOS 53 10/15/2015 0818   AST 15 11/06/2015 1219   AST 17 10/15/2015 0818   ALT 13 11/06/2015 1219   ALT 13 10/15/2015 0818   BILITOT 0.57 11/06/2015 1219   BILITOT 0.9 10/15/2015 0818       Lab Results  Component Value Date   WBC 8.2 11/06/2015   HGB 13.7 11/06/2015   HCT 38.9 11/06/2015   MCV 88.0 11/06/2015   PLT 308 11/06/2015   NEUTROABS  6.0 11/06/2015     ASSESSMENT & PLAN:  Breast cancer of lower-inner quadrant of left female breast ([place]) Left lumpectomy 11/28/2015: DCIS with calcifications, high-grade, 1.3 cm, focally less than 0.1 cm to posterior margin and focally less than 0.1 cm lateral margin, ER 95%, PR 80%, TisN0 stage 0  Pathology counseling: I discussed the final pathology report of the patient provided  a copy of this report. I discussed the margins. We also discussed the final staging along with previously performed ER/PR Testing. We presented her case at tumor board and the decision was made to obtain a mammogram. The mammogram still shows residual findings concerning for residual DCIS, she may need reexcision.  Treatment plan: 1. ? Reexcision 2. Radiation therapy 3. Followed by adjuvant antiestrogen therapy.  Prognosis: Based on [facility] nomogram, risk of recurrence without any further treatment 5 years 13% and 10 years 20%; both radiation and antiestrogen therapy are done, risk of recurrence 5 years 2%, 10 years 4 %; with antiestrogen therapy alone, risk of recurrence 6% 5 years and 10% at 10 years; with radiation therapy alone, risk of recurrence 5% at 5 years and 8% at 10 years  Return to clinic after radiation therapy to start  antiestrogen therapy.   No orders of the defined types were placed in this encounter.   The patient has a good understanding of the overall plan. she agrees with it. she will call with any problems that may develop before the next visit here.   Rulon Eisenmenger, MD 12/13/2015

## 2015-12-13 NOTE — Telephone Encounter (Signed)
appt made and avs printed °

## 2015-12-13 NOTE — Assessment & Plan Note (Signed)
Left lumpectomy 11/28/2015: DCIS with calcifications, high-grade, 1.3 cm, focally less than 0.1 cm to posterior margin and focally less than 0.1 cm lateral margin, ER 95%, PR 80%, TisN0 stage 0  Pathology counseling: I discussed the final pathology report of the patient provided  a copy of this report. I discussed the margins. We also discussed the final staging along with previously performed ER/PR Testing. We presented her case at tumor board and the decision was made to obtain a mammogram. The mammogram still shows residual findings concerning for residual DCIS, she may need reexcision. If the mammogram does not show any such changes, but no reexcision is going to be recommended.  Treatment plan: 1. ? Reexcision 2. Radiation therapy 3. Followed by adjuvant antiestrogen therapy.  Prognosis: Based on [facility] nomogram, risk of recurrence without any further treatment 5 years 13% and 10 years 20%; both radiation and antiestrogen therapy are done, risk of recurrence 5 years 2%, 10 years 4 %; with antiestrogen therapy alone, risk of recurrence 6% 5 years and 10% at 10 years; with radiation therapy alone, risk of recurrence 5% at 5 years and 8% at 10 years  Return to clinic after radiation therapy to start antiestrogen therapy.

## 2015-12-17 DIAGNOSIS — H26492 Other secondary cataract, left eye: Secondary | ICD-10-CM | POA: Diagnosis not present

## 2015-12-18 ENCOUNTER — Ambulatory Visit: Payer: Medicare HMO | Admitting: Radiation Oncology

## 2015-12-18 ENCOUNTER — Ambulatory Visit: Payer: Medicare HMO

## 2015-12-19 ENCOUNTER — Other Ambulatory Visit: Payer: Self-pay | Admitting: General Surgery

## 2015-12-19 DIAGNOSIS — C50312 Malignant neoplasm of lower-inner quadrant of left female breast: Secondary | ICD-10-CM

## 2015-12-30 ENCOUNTER — Encounter (HOSPITAL_BASED_OUTPATIENT_CLINIC_OR_DEPARTMENT_OTHER): Payer: Self-pay | Admitting: *Deleted

## 2015-12-30 ENCOUNTER — Institutional Professional Consult (permissible substitution): Payer: Medicare HMO | Admitting: Radiation Oncology

## 2015-12-31 ENCOUNTER — Encounter (HOSPITAL_BASED_OUTPATIENT_CLINIC_OR_DEPARTMENT_OTHER)
Admission: RE | Admit: 2015-12-31 | Discharge: 2015-12-31 | Disposition: A | Discharge status: Home / Self Care | Payer: Medicare HMO | Source: Ambulatory Visit | Attending: General Surgery | Admitting: General Surgery

## 2015-12-31 DIAGNOSIS — Z79899 Other long term (current) drug therapy: Secondary | ICD-10-CM | POA: Diagnosis not present

## 2015-12-31 DIAGNOSIS — Z7951 Long term (current) use of inhaled steroids: Secondary | ICD-10-CM | POA: Diagnosis not present

## 2015-12-31 DIAGNOSIS — I10 Essential (primary) hypertension: Secondary | ICD-10-CM | POA: Diagnosis not present

## 2015-12-31 DIAGNOSIS — M810 Age-related osteoporosis without current pathological fracture: Secondary | ICD-10-CM | POA: Diagnosis not present

## 2015-12-31 DIAGNOSIS — N6092 Unspecified benign mammary dysplasia of left breast: Secondary | ICD-10-CM | POA: Diagnosis not present

## 2015-12-31 DIAGNOSIS — J45909 Unspecified asthma, uncomplicated: Secondary | ICD-10-CM | POA: Diagnosis not present

## 2015-12-31 DIAGNOSIS — D0512 Intraductal carcinoma in situ of left breast: Secondary | ICD-10-CM | POA: Diagnosis not present

## 2015-12-31 LAB — BASIC METABOLIC PANEL
Anion gap: 7 (ref 5–15)
BUN: 15 mg/dL (ref 6–20)
CHLORIDE: 103 mmol/L (ref 101–111)
CO2: 26 mmol/L (ref 22–32)
CREATININE: 1.18 mg/dL — AB (ref 0.44–1.00)
Calcium: 9.4 mg/dL (ref 8.9–10.3)
GFR calc Af Amer: 51 mL/min — ABNORMAL LOW (ref >60)
GFR calc non Af Amer: 44 mL/min — ABNORMAL LOW (ref >60)
Glucose, Bld: 127 mg/dL — ABNORMAL HIGH (ref 65–99)
POTASSIUM: 4.1 mmol/L (ref 3.5–5.1)
SODIUM: 136 mmol/L (ref 135–145)

## 2015-12-31 NOTE — Progress Notes (Signed)
Pt given 8oz carton of wild berry boost breeze to drink at 10 am morning of surgery, given verbal and written instructions (teach back). Pt instructed no other liquid after midnight.  Pt voiced understanding.

## 2016-01-03 ENCOUNTER — Encounter (HOSPITAL_BASED_OUTPATIENT_CLINIC_OR_DEPARTMENT_OTHER): Payer: Self-pay

## 2016-01-03 ENCOUNTER — Ambulatory Visit (HOSPITAL_BASED_OUTPATIENT_CLINIC_OR_DEPARTMENT_OTHER): Payer: Medicare HMO | Admitting: Anesthesiology

## 2016-01-03 ENCOUNTER — Encounter (HOSPITAL_BASED_OUTPATIENT_CLINIC_OR_DEPARTMENT_OTHER)
Admission: RE | Disposition: A | Discharge status: Home / Self Care | Payer: Self-pay | Source: Ambulatory Visit | Attending: General Surgery

## 2016-01-03 ENCOUNTER — Ambulatory Visit (HOSPITAL_BASED_OUTPATIENT_CLINIC_OR_DEPARTMENT_OTHER)
Admission: RE | Admit: 2016-01-03 | Discharge: 2016-01-03 | Disposition: A | Discharge status: Home / Self Care | Payer: Medicare HMO | Source: Ambulatory Visit | Attending: General Surgery | Admitting: General Surgery

## 2016-01-03 DIAGNOSIS — N6092 Unspecified benign mammary dysplasia of left breast: Secondary | ICD-10-CM | POA: Insufficient documentation

## 2016-01-03 DIAGNOSIS — C50912 Malignant neoplasm of unspecified site of left female breast: Secondary | ICD-10-CM | POA: Diagnosis not present

## 2016-01-03 DIAGNOSIS — I129 Hypertensive chronic kidney disease with stage 1 through stage 4 chronic kidney disease, or unspecified chronic kidney disease: Secondary | ICD-10-CM | POA: Diagnosis not present

## 2016-01-03 DIAGNOSIS — I10 Essential (primary) hypertension: Secondary | ICD-10-CM | POA: Insufficient documentation

## 2016-01-03 DIAGNOSIS — C50919 Malignant neoplasm of unspecified site of unspecified female breast: Secondary | ICD-10-CM | POA: Diagnosis not present

## 2016-01-03 DIAGNOSIS — R920 Mammographic microcalcification found on diagnostic imaging of breast: Secondary | ICD-10-CM | POA: Diagnosis not present

## 2016-01-03 DIAGNOSIS — Z79899 Other long term (current) drug therapy: Secondary | ICD-10-CM | POA: Diagnosis not present

## 2016-01-03 DIAGNOSIS — D0512 Intraductal carcinoma in situ of left breast: Secondary | ICD-10-CM | POA: Diagnosis not present

## 2016-01-03 DIAGNOSIS — M810 Age-related osteoporosis without current pathological fracture: Secondary | ICD-10-CM | POA: Insufficient documentation

## 2016-01-03 DIAGNOSIS — Z0000 Encounter for general adult medical examination without abnormal findings: Secondary | ICD-10-CM | POA: Diagnosis not present

## 2016-01-03 DIAGNOSIS — N183 Chronic kidney disease, stage 3 (moderate): Secondary | ICD-10-CM | POA: Diagnosis not present

## 2016-01-03 DIAGNOSIS — J45909 Unspecified asthma, uncomplicated: Secondary | ICD-10-CM | POA: Insufficient documentation

## 2016-01-03 DIAGNOSIS — Z7951 Long term (current) use of inhaled steroids: Secondary | ICD-10-CM | POA: Insufficient documentation

## 2016-01-03 HISTORY — PX: BREAST LUMPECTOMY WITH NEEDLE LOCALIZATION: SHX5759

## 2016-01-03 SURGERY — BREAST LUMPECTOMY WITH NEEDLE LOCALIZATION
Anesthesia: General | Site: Breast | Laterality: Left

## 2016-01-03 MED ORDER — GLYCOPYRROLATE 0.2 MG/ML IV SOSY
PREFILLED_SYRINGE | INTRAVENOUS | Status: AC
  Filled 2016-01-03: qty 3

## 2016-01-03 MED ORDER — ACETAMINOPHEN 500 MG PO TABS
ORAL_TABLET | ORAL | Status: AC
  Filled 2016-01-03: qty 2

## 2016-01-03 MED ORDER — FENTANYL CITRATE (PF) 100 MCG/2ML IJ SOLN
50.0000 ug | INTRAMUSCULAR | Status: DC | PRN
  Administered 2016-01-03: 50 ug via INTRAVENOUS

## 2016-01-03 MED ORDER — ACETAMINOPHEN 500 MG PO TABS
1000.0000 mg | ORAL_TABLET | ORAL | Status: AC
  Administered 2016-01-03: 1000 mg via ORAL

## 2016-01-03 MED ORDER — GLYCOPYRROLATE 0.2 MG/ML IV SOSY
PREFILLED_SYRINGE | INTRAVENOUS | Status: DC | PRN
  Administered 2016-01-03: .2 mg via INTRAVENOUS

## 2016-01-03 MED ORDER — LIDOCAINE 2% (20 MG/ML) 5 ML SYRINGE
INTRAMUSCULAR | Status: AC
  Filled 2016-01-03: qty 5

## 2016-01-03 MED ORDER — MIDAZOLAM HCL 2 MG/2ML IJ SOLN: 1.0000 mg | INTRAMUSCULAR | Status: DC | PRN

## 2016-01-03 MED ORDER — CIPROFLOXACIN IN D5W 400 MG/200ML IV SOLN
INTRAVENOUS | Status: AC
  Filled 2016-01-03: qty 200

## 2016-01-03 MED ORDER — EPHEDRINE SULFATE-NACL 50-0.9 MG/10ML-% IV SOSY
PREFILLED_SYRINGE | INTRAVENOUS | Status: DC | PRN
  Administered 2016-01-03: 10 mg via INTRAVENOUS

## 2016-01-03 MED ORDER — DEXAMETHASONE SODIUM PHOSPHATE 10 MG/ML IJ SOLN
INTRAMUSCULAR | Status: AC
  Filled 2016-01-03: qty 1

## 2016-01-03 MED ORDER — DEXAMETHASONE SODIUM PHOSPHATE 4 MG/ML IJ SOLN
INTRAMUSCULAR | Status: DC | PRN
Start: 2016-01-03 — End: 2016-01-03
  Administered 2016-01-03: 10 mg via INTRAVENOUS

## 2016-01-03 MED ORDER — SCOPOLAMINE 1 MG/3DAYS TD PT72
1.0000 | MEDICATED_PATCH | Freq: Once | TRANSDERMAL | Status: DC | PRN
Start: 2016-01-03 — End: 2016-01-03

## 2016-01-03 MED ORDER — EPHEDRINE 5 MG/ML INJ
INTRAVENOUS | Status: AC
  Filled 2016-01-03: qty 10

## 2016-01-03 MED ORDER — FENTANYL CITRATE (PF) 100 MCG/2ML IJ SOLN: 25.0000 ug | INTRAMUSCULAR | Status: DC | PRN

## 2016-01-03 MED ORDER — BUPIVACAINE HCL (PF) 0.25 % IJ SOLN
INTRAMUSCULAR | Status: AC
  Filled 2016-01-03: qty 30

## 2016-01-03 MED ORDER — CIPROFLOXACIN IN D5W 400 MG/200ML IV SOLN
400.0000 mg | INTRAVENOUS | Status: AC
  Administered 2016-01-03: 400 mg via INTRAVENOUS

## 2016-01-03 MED ORDER — GABAPENTIN 300 MG PO CAPS
ORAL_CAPSULE | ORAL | Status: AC
  Filled 2016-01-03: qty 1

## 2016-01-03 MED ORDER — PROPOFOL 10 MG/ML IV BOLUS
INTRAVENOUS | Status: DC | PRN
  Administered 2016-01-03: 150 mg via INTRAVENOUS

## 2016-01-03 MED ORDER — FENTANYL CITRATE (PF) 100 MCG/2ML IJ SOLN
INTRAMUSCULAR | Status: AC
  Filled 2016-01-03: qty 2

## 2016-01-03 MED ORDER — BUPIVACAINE HCL (PF) 0.25 % IJ SOLN
INTRAMUSCULAR | Status: DC | PRN
  Administered 2016-01-03: 10 mL

## 2016-01-03 MED ORDER — GABAPENTIN 300 MG PO CAPS
300.0000 mg | ORAL_CAPSULE | ORAL | Status: AC
Start: 2016-01-03 — End: 2016-01-03
  Administered 2016-01-03: 300 mg via ORAL

## 2016-01-03 MED ORDER — HYDROCODONE-ACETAMINOPHEN 10-325 MG PO TABS: 1.0000 | ORAL_TABLET | Freq: Four times a day (QID) | ORAL | 0 refills | Status: DC | PRN

## 2016-01-03 MED ORDER — GLYCOPYRROLATE 0.2 MG/ML IJ SOLN: 0.2000 mg | Freq: Once | INTRAMUSCULAR | Status: DC | PRN

## 2016-01-03 MED ORDER — ONDANSETRON HCL 4 MG/2ML IJ SOLN
INTRAMUSCULAR | Status: AC
  Filled 2016-01-03: qty 2

## 2016-01-03 MED ORDER — LACTATED RINGERS IV SOLN
INTRAVENOUS | Status: DC
  Administered 2016-01-03: 13:00:00 via INTRAVENOUS

## 2016-01-03 MED ORDER — LIDOCAINE 2% (20 MG/ML) 5 ML SYRINGE
INTRAMUSCULAR | Status: DC | PRN
  Administered 2016-01-03: 60 mg via INTRAVENOUS

## 2016-01-03 SURGICAL SUPPLY — 51 items
BINDER BREAST LRG (GAUZE/BANDAGES/DRESSINGS) IMPLANT
BINDER BREAST MEDIUM (GAUZE/BANDAGES/DRESSINGS) IMPLANT
BINDER BREAST XLRG (GAUZE/BANDAGES/DRESSINGS) IMPLANT
BINDER BREAST XXLRG (GAUZE/BANDAGES/DRESSINGS) IMPLANT
BLADE SURG 15 STRL LF DISP TIS (BLADE) ×1 IMPLANT
BLADE SURG 15 STRL SS (BLADE) ×2
CANISTER SUCT 1200ML W/VALVE (MISCELLANEOUS) IMPLANT
CHLORAPREP W/TINT 26ML (MISCELLANEOUS) ×2 IMPLANT
CLIP TI WIDE RED SMALL 6 (CLIP) IMPLANT
COVER BACK TABLE 60X90IN (DRAPES) ×2 IMPLANT
COVER MAYO STAND STRL (DRAPES) ×2 IMPLANT
DECANTER SPIKE VIAL GLASS SM (MISCELLANEOUS) IMPLANT
DEVICE DUBIN W/COMP PLATE 8390 (MISCELLANEOUS) IMPLANT
DRAPE LAPAROSCOPIC ABDOMINAL (DRAPES) ×2 IMPLANT
DRSG TEGADERM 4X4.75 (GAUZE/BANDAGES/DRESSINGS) ×2 IMPLANT
ELECT COATED BLADE 2.86 ST (ELECTRODE) ×2 IMPLANT
ELECT REM PT RETURN 9FT ADLT (ELECTROSURGICAL) ×2
ELECTRODE REM PT RTRN 9FT ADLT (ELECTROSURGICAL) ×1 IMPLANT
GLOVE BIO SURGEON STRL SZ7 (GLOVE) ×2 IMPLANT
GLOVE BIOGEL PI IND STRL 7.5 (GLOVE) ×1 IMPLANT
GLOVE BIOGEL PI INDICATOR 7.5 (GLOVE) ×1
GOWN STRL REUS W/ TWL LRG LVL3 (GOWN DISPOSABLE) ×3 IMPLANT
GOWN STRL REUS W/TWL LRG LVL3 (GOWN DISPOSABLE) ×6
HEMOSTAT ARISTA ABSORB 3G PWDR (MISCELLANEOUS) ×2 IMPLANT
ILLUMINATOR WAVEGUIDE N/F (MISCELLANEOUS) IMPLANT
KIT MARKER MARGIN INK (KITS) IMPLANT
LIGHT WAVEGUIDE WIDE FLAT (MISCELLANEOUS) IMPLANT
LIQUID BAND (GAUZE/BANDAGES/DRESSINGS) ×2 IMPLANT
NEEDLE HYPO 25X1 1.5 SAFETY (NEEDLE) ×2 IMPLANT
NS IRRIG 1000ML POUR BTL (IV SOLUTION) IMPLANT
PACK BASIN DAY SURGERY FS (CUSTOM PROCEDURE TRAY) ×2 IMPLANT
PENCIL BUTTON HOLSTER BLD 10FT (ELECTRODE) ×2 IMPLANT
SLEEVE SCD COMPRESS KNEE MED (MISCELLANEOUS) ×2 IMPLANT
SPONGE GAUZE 4X4 12PLY STER LF (GAUZE/BANDAGES/DRESSINGS) ×2 IMPLANT
SPONGE LAP 4X18 X RAY DECT (DISPOSABLE) ×2 IMPLANT
STRIP CLOSURE SKIN 1/2X4 (GAUZE/BANDAGES/DRESSINGS) IMPLANT
SUT MNCRL AB 4-0 PS2 18 (SUTURE) IMPLANT
SUT MON AB 5-0 PS2 18 (SUTURE) IMPLANT
SUT SILK 2 0 SH (SUTURE) ×2 IMPLANT
SUT VIC AB 2-0 SH 27 (SUTURE) ×2
SUT VIC AB 2-0 SH 27XBRD (SUTURE) ×1 IMPLANT
SUT VIC AB 3-0 SH 27 (SUTURE) ×2
SUT VIC AB 3-0 SH 27X BRD (SUTURE) ×1 IMPLANT
SUT VIC AB 5-0 PS2 18 (SUTURE) IMPLANT
SUT VICRYL AB 3 0 TIES (SUTURE) IMPLANT
SYR CONTROL 10ML LL (SYRINGE) ×2 IMPLANT
TAPE STRIPS DRAPE STRL (GAUZE/BANDAGES/DRESSINGS) ×2 IMPLANT
TOWEL OR 17X24 6PK STRL BLUE (TOWEL DISPOSABLE) ×2 IMPLANT
TOWEL OR NON WOVEN STRL DISP B (DISPOSABLE) ×2 IMPLANT
TUBE CONNECTING 20X1/4 (TUBING) IMPLANT
YANKAUER SUCT BULB TIP NO VENT (SUCTIONS) IMPLANT

## 2016-01-03 NOTE — Anesthesia Procedure Notes (Signed)
Procedure Name: LMA Insertion Date/Time: 01/03/2016 12:39 PM Performed by: Lyndee Leo Pre-anesthesia Checklist: Patient identified, Emergency Drugs available, Suction available and Patient being monitored Patient Re-evaluated:Patient Re-evaluated prior to inductionOxygen Delivery Method: Circle system utilized Preoxygenation: Pre-oxygenation with 100% oxygen Intubation Type: IV induction Ventilation: Mask ventilation without difficulty LMA: LMA inserted LMA Size: 4.0 Number of attempts: 1 Airway Equipment and Method: Bite block Placement Confirmation: positive ETCO2 Tube secured with: Tape Dental Injury: Teeth and Oropharynx as per pre-operative assessment

## 2016-01-03 NOTE — Anesthesia Preprocedure Evaluation (Addendum)
Anesthesia Evaluation  Patient identified by MRN, date of birth, ID band Patient awake    Reviewed: Allergy & Precautions, NPO status , Patient's Chart, lab work & pertinent test results  Airway Mallampati: II  TM Distance: >3 FB Neck ROM: Full    Dental no notable dental hx.    Pulmonary asthma ,    Pulmonary exam normal breath sounds clear to auscultation       Cardiovascular hypertension, Pt. on medications and Pt. on home beta blockers Normal cardiovascular exam Rhythm:Regular Rate:Normal     Neuro/Psych negative neurological ROS  negative psych ROS   GI/Hepatic negative GI ROS, Neg liver ROS,   Endo/Other  negative endocrine ROS  Renal/GU Renal diseaseCKD stage 3. Last GFR is 47  negative genitourinary   Musculoskeletal negative musculoskeletal ROS (+)   Abdominal   Peds negative pediatric ROS (+)  Hematology negative hematology ROS (+)   Anesthesia Other Findings   Reproductive/Obstetrics negative OB ROS                             Lab Results  Component Value Date   WBC 8.2 11/06/2015   HGB 13.7 11/06/2015   HCT 38.9 11/06/2015   MCV 88.0 11/06/2015   PLT 308 11/06/2015   Lab Results  Component Value Date   CREATININE 1.18 (H) 12/31/2015   BUN 15 12/31/2015   NA 136 12/31/2015   K 4.1 12/31/2015   CL 103 12/31/2015   CO2 26 12/31/2015    Anesthesia Physical  Anesthesia Plan  ASA: III  Anesthesia Plan: General   Post-op Pain Management:    Induction: Intravenous  Airway Management Planned: LMA  Additional Equipment:   Intra-op Plan:   Post-operative Plan: Extubation in OR  Informed Consent: I have reviewed the patients History and Physical, chart, labs and discussed the procedure including the risks, benefits and alternatives for the proposed anesthesia with the patient or authorized representative who has indicated his/her understanding and acceptance.    Dental advisory given  Plan Discussed with: CRNA  Anesthesia Plan Comments:         Anesthesia Quick Evaluation

## 2016-01-03 NOTE — Op Note (Signed)
Preoperative diagnosis:left breast cancer, stage 0, s/p bracketed lumpectomy with close lateral margin and one additional remaining calc on postop mm Postoperative diagnosis: same as above Procedure: Left breast wire guided excision of calc and lateral margin Surgeon Dr Serita Grammes Anes general  EBL: minimal Comps none Specimen: left breast tissue with new lateral marginmarked with paint Sponge count correct at completion dispo to recovery stable  Indications: This is a 30 yof who presents with about a 4 cm area of calcifications and dcis.  We discussed all options and elected to proceed with seed guided bracketed lumpectomy.she underwent this and was noted to have a close lateral margin with dcis.  I obtained mm postop and she had one additional calcification. Discussed going back to or to remove calc (will do with wire guidance to ensure removal) and lateral margin.   Procedure: After informed consent was obtained the patient was taken to the OR. She was injected with technetium in the standard periareolar fashion.She was given abx and  SCDs were in place. She was prepped and draped in the standard sterile surgical fashion. A timeout was performed. I reentered the old incision. I opened all the sutures. I brought the wire in.  I then proceeded to remove the wire and what ends up being that lateral margin.  This was painted. Mm was taken.  Hemostasis was observed.  I then closed this with 2-0 vicryl, 3-0 vicryl and 4-0 monocryl. I placed glue on this wound. A binder was placed, she was awakened and transferred to recovery stable.

## 2016-01-03 NOTE — Anesthesia Postprocedure Evaluation (Signed)
Anesthesia Post Note  Patient: Lisa Greene  Procedure(s) Performed: Procedure(s) (LRB): BREAST re-excision LUMPECTOMY WITH NEEDLE LOCALIZATION (Left)  Patient location during evaluation: PACU Anesthesia Type: General Level of consciousness: awake and alert Pain management: pain level controlled Vital Signs Assessment: post-procedure vital signs reviewed and stable Respiratory status: spontaneous breathing, nonlabored ventilation, respiratory function stable and patient connected to nasal cannula oxygen Cardiovascular status: blood pressure returned to baseline and stable Postop Assessment: no signs of nausea or vomiting Anesthetic complications: no    Last Vitals:  Vitals:   01/03/16 1330 01/03/16 1345  BP: 112/63 122/62  Pulse: 81 70  Resp: 17 14  Temp:      Last Pain:  Vitals:   01/03/16 1050  TempSrc: Oral                 [name]

## 2016-01-03 NOTE — Interval H&P Note (Signed)
History and Physical Interval Note:  01/03/2016 12:04 PM  Lisa Greene  has presented today for surgery, with the diagnosis of LEFT BREAST CANCER  The various methods of treatment have been discussed with the patient and family. After consideration of risks, benefits and other options for treatment, the patient has consented to  Procedure(s): BREAST re-excision LUMPECTOMY WITH NEEDLE LOCALIZATION (Left) as a surgical intervention .  The patient's history has been reviewed, patient examined, no change in status, stable for surgery.  I have reviewed the patient's chart and labs.  Questions were answered to the patient's satisfaction.     Kazuo Durnil

## 2016-01-03 NOTE — H&P (Signed)
Lisa Greene is an 75 y.o. female.   Chief Complaint: breast cancer HPI:  80 yof s/p seed bracketed lumpectomy doing well. her path show dcis measuring 1.3 cm (this was in much larger area of calcs) that was er/pr pos. the posterior margin is focally close but this is muscle. the lateral margin is focally less than 1 mm. there is remaining calcification which will be removed   Past Medical History:  Diagnosis Date  . ALLERGIC RHINITIS 01/31/2008  . Allergy   . Asthma   . Breast cancer ([place])   . Breast cancer of lower-inner quadrant of left female breast ([place]) 11/01/2015  . Cataract   . Colon cancer screening 05/21/2014   No polyps at age 68. Diverticulosis alone-no more colonoscopies.    Marland Kitchen DIVERTICULITIS, HX OF 09/07/2008   pt denies this   . Eosinophilia 02/08/2009  . Heart murmur    slight per pt.   Marland Kitchen HYPERLIPIDEMIA 12/20/2006  . Hypertension   . Osteopenia   . OSTEOPOROSIS 12/20/2006    Past Surgical History:  Procedure Laterality Date  . BREAST LUMPECTOMY WITH RADIOACTIVE SEED LOCALIZATION Left 11/28/2015   Procedure: LEFT BREAST LUMPECTOMY WITH BRACKETED RADIOACTIVE SEED LOCALIZATION;  Surgeon: Rolm Bookbinder, MD;  Location: [facility];  Service: General;  Laterality: Left;  . BREAST SURGERY     cysts removed x2  . CATARACT EXTRACTION BILATERAL W/ ANTERIOR VITRECTOMY  2013   bilateral cataracts  . COLONOSCOPY  2005   tics only   . KNEE ARTHROSCOPY     left  . NEUROMA SURGERY     x2 feet  . thyroid duct cyst      Family History  Problem Relation Age of Onset  . COPD Father   . Heart disease Father     stent placed 37  . Brain cancer Mother   . Colon cancer Neg Hx   . Stomach cancer Neg Hx    Social History:  reports that she has never smoked. She has never used smokeless tobacco. She reports that she does not drink alcohol or use drugs.  Allergies:  Allergies  Allergen Reactions  . Lisinopril     Dry cough and stinging rash  . Penicillins      REACTION: rash  . Pork-Derived Products Other (See Comments)    Headache   . Sulfonamide Derivatives     REACTION: rash    Medications Prior to Admission  Medication Sig Dispense Refill  . cholecalciferol (VITAMIN D) 1000 UNITS tablet Take 2,000 Units by mouth daily.    . Docosanol (ABREVA EX) Apply topically 3 (three) times daily.    . fluticasone furoate-vilanterol (BREO ELLIPTA) 100-25 MCG/INH AEPB Inhale 1 puff into the lungs daily.    . hydrochlorothiazide (HYDRODIURIL) 25 MG tablet TAKE 1 TABLET (25 MG TOTAL) BY MOUTH DAILY. 90 tablet 3  . losartan (COZAAR) 100 MG tablet TAKE 1 TABLET (100 MG TOTAL) BY MOUTH DAILY. 30 tablet 4  . metoprolol succinate (TOPROL-XL) 25 MG 24 hr tablet TAKE 1 TABLET (25 MG TOTAL) BY MOUTH DAILY. 90 tablet 3  . montelukast (SINGULAIR) 10 MG tablet Take 1 tablet by mouth at bedtime.    . niacin 500 MG tablet Take 500 mg by mouth at bedtime.    . Albuterol Sulfate (PROAIR RESPICLICK) 123XX123 (90 BASE) MCG/ACT AEPB Inhale 1 puff into the lungs every 8 (eight) hours as needed. 1 each 0  . Calcium-Vitamin D-Vitamin K (VIACTIV PO) Take by mouth.    Marland Kitchen  fluticasone (FLONASE) 50 MCG/ACT nasal spray Place 1 spray into both nostrils daily as needed.       No results found for this or any previous visit (from the past 48 hour(s)). No results found.  ROS Negative  Blood pressure 134/63, pulse 70, resp. rate 18, height 5\' 2"  (1.575 m), weight 68.5 kg (151 lb), SpO2 98 %. Physical Exam   Vitals  Weight: 152 lb Height: 62in Body Surface Area: 1.7 m Body Mass Index: 27.8 kg/m  Temp.: 97.25F(Temporal)  Pulse: 77 (Regular)  BP: 122/76 (Sitting, Left Arm, Standard) Physical Exam  Breast Note: left breast liq incision healing well without infection cv rrr pulm clear bilaterally  Assessment/Plan POSTOPERATIVE STATE [id number]) Plan for wire guided excisional biopsy of remaining calc and margin excision    Jamontae Thwaites, MD 01/03/2016, 12:02  PM

## 2016-01-03 NOTE — Discharge Instructions (Signed)
[facility] °Office Phone Number [phone] ° °POST OP INSTRUCTIONS ° °Always review your discharge instruction sheet given to you by the facility where your surgery was performed. ° °IF YOU HAVE DISABILITY OR FAMILY LEAVE FORMS, YOU MUST BRING THEM TO THE OFFICE FOR PROCESSING.  DO NOT GIVE THEM TO YOUR DOCTOR. ° °1. A prescription for pain medication may be given to you upon discharge.  Take your pain medication as prescribed, if needed.  If narcotic pain medicine is not needed, then you may take acetaminophen (Tylenol), naprosyn (Alleve) or ibuprofen (Advil) as needed. °2. Take your usually prescribed medications unless otherwise directed °3. If you need a refill on your pain medication, please contact your pharmacy.  They will contact our office to request authorization.  Prescriptions will not be filled after 5pm or on week-ends. °4. You should eat very light the first 24 hours after surgery, such as soup, crackers, pudding, etc.  Resume your normal diet the day after surgery. °5. Most patients will experience some swelling and bruising in the breast.  Ice packs and a good support bra will help.  Wear the breast binder provided or a sports bra for 72 hours day and night.  After that wear a sports bra during the day until you return to the office. Swelling and bruising can take several days to resolve.  °6. It is common to experience some constipation if taking pain medication after surgery.  Increasing fluid intake and taking a stool softener will usually help or prevent this problem from occurring.  A mild laxative (Milk of Magnesia or Miralax) should be taken according to package directions if there are no bowel movements after 48 hours. °7. Unless discharge instructions indicate otherwise, you may remove your bandages 48 hours after surgery and you may shower at that time.  You may have steri-strips (small skin tapes) in place directly over the incision.  These strips should be left on the  skin for 7-10 days and will come off on their own.  If your surgeon used skin glue on the incision, you may shower in 24 hours.  The glue will flake off over the next 2-3 weeks.  Any sutures or staples will be removed at the office during your follow-up visit. °8. ACTIVITIES:  You may resume regular daily activities (gradually increasing) beginning the next day.  Wearing a good support bra or sports bra minimizes pain and swelling.  You may have sexual intercourse when it is comfortable. °a. You may drive when you no longer are taking prescription pain medication, you can comfortably wear a seatbelt, and you can safely maneuver your car and apply brakes. °b. RETURN TO WORK:  ______________________________________________________________________________________ °9. You should see your doctor in the office for a follow-up appointment approximately two weeks after your surgery.  Your doctor’s nurse will typically make your follow-up appointment when she calls you with your pathology report.  Expect your pathology report 3-4 business days after your surgery.  You may call to check if you do not hear from us after three days. °10. OTHER INSTRUCTIONS: _______________________________________________________________________________________________ _____________________________________________________________________________________________________________________________________ °_____________________________________________________________________________________________________________________________________ °_____________________________________________________________________________________________________________________________________ ° °WHEN TO CALL DR WAKEFIELD: °1. Fever over 101.0 °2. Nausea and/or vomiting. °3. Extreme swelling or bruising. °4. Continued bleeding from incision. °5. Increased pain, redness, or drainage from the incision. ° °The clinic staff is available to answer your questions during regular  business hours.  Please don’t hesitate to call and ask to speak to one of the nurses for clinical concerns.  If   you have a medical emergency, go to the nearest emergency room or call 911.  A surgeon from [facility] is always on call at the hospital. ° °For further questions, please visit [email or web address] ° ° ° °Post Anesthesia Home Care Instructions ° °Activity: °Get plenty of rest for the remainder of the day. A responsible adult should stay with you for 24 hours following the procedure.  °For the next 24 hours, DO NOT: °-Drive a car °-Operate machinery °-Drink alcoholic beverages °-Take any medication unless instructed by your physician °-Make any legal decisions or sign important papers. ° °Meals: °Start with liquid foods such as gelatin or soup. Progress to regular foods as tolerated. Avoid greasy, spicy, heavy foods. If nausea and/or vomiting occur, drink only clear liquids until the nausea and/or vomiting subsides. Call your physician if vomiting continues. ° °Special Instructions/Symptoms: °Your throat may feel dry or sore from the anesthesia or the breathing tube placed in your throat during surgery. If this causes discomfort, gargle with warm salt water. The discomfort should disappear within 24 hours. ° °If you had a scopolamine patch placed behind your ear for the management of post- operative nausea and/or vomiting: ° °1. The medication in the patch is effective for 72 hours, after which it should be removed.  Wrap patch in a tissue and discard in the trash. Wash hands thoroughly with soap and water. °2. You may remove the patch earlier than 72 hours if you experience unpleasant side effects which may include dry mouth, dizziness or visual disturbances. °3. Avoid touching the patch. Wash your hands with soap and water after contact with the patch. °  ° °

## 2016-01-03 NOTE — Transfer of Care (Signed)
Immediate Anesthesia Transfer of Care Note  Patient: Lisa Greene  Procedure(s) Performed: Procedure(s) with comments: BREAST re-excision LUMPECTOMY WITH NEEDLE LOCALIZATION (Left) - BREAST re-excision LUMPECTOMY WITH NEEDLE LOCALIZATION  Patient Location: PACU  Anesthesia Type:General  Level of Consciousness: awake, sedated and patient cooperative  Airway & Oxygen Therapy: Patient Spontanous Breathing and Patient connected to face mask oxygen  Post-op Assessment: Report given to RN and Post -op Vital signs reviewed and stable  Post vital signs: Reviewed and stable  Last Vitals:  Vitals:   01/03/16 1050  BP: 134/63  Pulse: 70  Resp: 18    Last Pain:  Vitals:   01/03/16 1050  TempSrc: Oral         Complications: No apparent anesthesia complications

## 2016-01-06 ENCOUNTER — Encounter (HOSPITAL_BASED_OUTPATIENT_CLINIC_OR_DEPARTMENT_OTHER): Payer: Self-pay | Admitting: General Surgery

## 2016-01-16 NOTE — Progress Notes (Signed)
Location of Breast Cancer:: Left breast  Lower Inner Quadrant   Histology per Pathology Report: Diagnosis 10/30/15: Breast, left, needle core biopsy - DUCTAL CARCINOMA IN SITU WITH CALCIFICATIONS.- FIBROCYSTIC CHANGES WITH ADENOSIS AND CALCIFICATIONS.- COMPLEX SCLEROSING LESION.- USUAL DUCTAL HYPERPLASIA  Receptor Status: ER(95%), PR (80% ), Her2-neu (), Ki-()  Did patient present with symptoms (if so, please note symptoms) or was this found on screening mammography?: Routine screening  Past/Anticipated interventions by surgeon, if [id number] 01/03/2016  Dr. Lennox Pippins Breast, excision, Left new lateral margin- FOCAL DUCTAL CARCINOMA IN SITU, INTERMEDIATE GRADE, 0.7 CM. - DUCTAL CARCINOMA IN SITU IS 0.1-0.2 CM FROM THE POSTERIOR INKED EDGE.- FIBROCYSTIC CHANGE, USUAL DUCTAL HYPERPLASIA.- NO MALIGNANCY IDENTIFIED    Diagnosis 11/28/15 Breast, lumpectomy, Left - DUCTAL CARCINOMA IN SITU WITH CALCIFICATIONS, HIGH GRADE, SPANNING 1.3 CM.- DUCTAL CARCINOMA IN SITU IS FOCALLY LESS THAN 0.1 CM TO THE POSTERIOR MARGIN AND FOCALLY LESS THAN 0.1 CM TO THE LATERAL MARGIN   Past/Anticipated interventions by medical oncology, if any: Chemotherapy :Dr. Lindi Adie seen 12/13/15, rfolow up after radiation completed, then adjuvant antiestrogen therapy  Lymphedema issues, if any: No    Pain issues, if any: Tenderness in right breast level 2/10  SAFETY ISSUES:  Prior radiation? NO  Pacemaker/ICD? NO  Possible current pregnancy?N/A  Is the patient on methotrexate? NO  Current Complaints / other details:  Seen in Breast Clinic       Menarche age 75, G28 menopause [age],oral contraceptives HX right breast incisional biopsies Dr. Marylene Buerger in the past Mother Cancer, , Brother Melanoma    Rebecca Eaton, RN 01/16/2016,5:07 PM  Allergies: lisinopril,pcns,pork-derived products, sulfonamide deriivitives

## 2016-01-19 NOTE — Progress Notes (Signed)
Radiation Oncology         ([phone]) [phone] ________________________________  Name: Lisa Greene MRN: [id number]  Date: 01/20/2016  DOB: May 05, 1941  FX:OVANVBT Lisa Channel, MD  Lisa Bookbinder, MD     REFERRING PHYSICIAN: Rolm Bookbinder, MD   DIAGNOSIS: The encounter diagnosis was Breast cancer of lower-inner quadrant of left female breast [facility]).   HISTORY OF PRESENT ILLNESS:Lisa Greene is a 75 y.o. female who was seen on 11/06/15 for an initial consultation visit regarding a diagnosis of DCIS of the left breast.  The patient presented was originally with suspicious calcifications on screening mammogram. A diagnostic mammogram confirmed this finding and a stereotactic biopsy of this area was performed. The area in question measured 4.1 cm. This returned positive for DCIS corresponding to a grade 2 tumor. Receptors studies have been performed and the tumor is estrogen receptor positive and progesterone receptor positive.  She was taken to the operating room on 11/26/15 where a left lumpectomy was performed revealing DCIS with calcifications, high-grade spanning 1.3 cm, and in situ disease focally less than 1 mm from the posterior and lateral margin.the remainder of her right lumpectomy with close lateral margin was completed on 01/03/2016, and the second specimen revealed focal ductal carcinoma in situ, intermediate grade, measuring 7 mm 1-2 mm from the posterior margin. She comes today to further discuss the role for radiotherapy.   PREVIOUS RADIATION THERAPY: No   PAST MEDICAL HISTORY:  Past Medical History:  Diagnosis Date  . ALLERGIC RHINITIS 01/31/2008  . Allergy   . Asthma   . Breast cancer ([place])   . Breast cancer of lower-inner quadrant of left female breast ([place]) 11/01/2015  . Cataract   . Colon cancer screening 05/21/2014   No polyps at age 6. Diverticulosis alone-no more colonoscopies.    Marland Kitchen DIVERTICULITIS, HX OF 09/07/2008   pt denies this   . Eosinophilia 02/08/2009  .  Heart murmur    slight per pt.   Marland Kitchen HYPERLIPIDEMIA 12/20/2006  . Hypertension   . Osteopenia   . OSTEOPOROSIS 12/20/2006     PAST SURGICAL HISTORY: Past Surgical History:  Procedure Laterality Date  . BREAST LUMPECTOMY WITH NEEDLE LOCALIZATION Left 01/03/2016   Procedure: BREAST re-excision LUMPECTOMY WITH NEEDLE LOCALIZATION;  Surgeon: Lisa Bookbinder, MD;  Location: [facility];  Service: General;  Laterality: Left;  BREAST re-excision LUMPECTOMY WITH NEEDLE LOCALIZATION  . BREAST LUMPECTOMY WITH RADIOACTIVE SEED LOCALIZATION Left 11/28/2015   Procedure: LEFT BREAST LUMPECTOMY WITH BRACKETED RADIOACTIVE SEED LOCALIZATION;  Surgeon: Lisa Bookbinder, MD;  Location: [facility];  Service: General;  Laterality: Left;  . BREAST SURGERY     cysts removed x2  . CATARACT EXTRACTION BILATERAL W/ ANTERIOR VITRECTOMY  2013   bilateral cataracts  . COLONOSCOPY  2005   tics only   . KNEE ARTHROSCOPY     left  . NEUROMA SURGERY     x2 feet  . thyroid duct cyst       FAMILY HISTORY:  Family History  Problem Relation Age of Onset  . COPD Father   . Heart disease Father     stent placed 55  . Brain cancer Mother   . Colon cancer Neg Hx   . Stomach cancer Neg Hx      SOCIAL HISTORY:  reports that she has never smoked. She has never used smokeless tobacco. She reports that she does not drink alcohol or use drugs. The patient lives in [place] and is married. She  spends much of her time at the [facility]: Lisinopril; Penicillins; Pork-derived products; and Sulfonamide derivatives   MEDICATIONS:  Current Outpatient Prescriptions  Medication Sig Dispense Refill  . Albuterol Sulfate (PROAIR RESPICLICK) 694 (90 BASE) MCG/ACT AEPB Inhale 1 puff into the lungs every 8 (eight) hours as needed. 1 each 0  . Calcium-Vitamin D-Vitamin K (VIACTIV PO) Take by mouth.    . cholecalciferol (VITAMIN D) 1000 UNITS tablet Take 2,000 Units by mouth daily.    .  Docosanol (ABREVA EX) Apply topically 3 (three) times daily.    . fluticasone (FLONASE) 50 MCG/ACT nasal spray Place 1 spray into both nostrils daily as needed.     . fluticasone furoate-vilanterol (BREO ELLIPTA) 100-25 MCG/INH AEPB Inhale 1 puff into the lungs daily.    . hydrochlorothiazide (HYDRODIURIL) 25 MG tablet TAKE 1 TABLET (25 MG TOTAL) BY MOUTH DAILY. 90 tablet 3  . HYDROcodone-acetaminophen (NORCO) 10-325 MG tablet Take 1 tablet by mouth every 6 (six) hours as needed. 10 tablet 0  . losartan (COZAAR) 100 MG tablet TAKE 1 TABLET (100 MG TOTAL) BY MOUTH DAILY. 30 tablet 4  . metoprolol succinate (TOPROL-XL) 25 MG 24 hr tablet TAKE 1 TABLET (25 MG TOTAL) BY MOUTH DAILY. 90 tablet 3  . montelukast (SINGULAIR) 10 MG tablet Take 1 tablet by mouth at bedtime.    . niacin 500 MG tablet Take 500 mg by mouth at bedtime.     No current facility-administered medications for this encounter.      REVIEW OF SYSTEMS: On review of systems, the patient reports that she is doing well overall. She denies any chest pain, shortness of breath, cough, fevers, chills, night sweats, unintended weight changes. She denies any bowel or bladder disturbances, and denies abdominal pain, nausea or vomiting. She denies any new musculoskeletal or joint aches or pains. A complete review of systems is obtained and is otherwise negative.   PHYSICAL EXAM:  vitals were not taken for this visit.  In general this is a well appearing Caucasian female in no acute distress. She's alert and oriented x4 and appropriate throughout the examination. Cardiopulmonary assessment is negative for acute distress and she exhibits normal effort. The left breast is evaluated with an intact lumpectomy site without erythema or separation/bleeding.   ECOG = 1  0 - Asymptomatic (Fully active, able to carry on all predisease activities without restriction)  1 - Symptomatic but completely ambulatory (Restricted in physically strenuous activity  but ambulatory and able to carry out work of a light or sedentary nature. For example, light housework, office work)  2 - Symptomatic, <50% in bed during the day (Ambulatory and capable of all self care but unable to carry out any work activities. Up and about more than 50% of waking hours)  3 - Symptomatic, >50% in bed, but not bedbound (Capable of only limited self-care, confined to bed or chair 50% or more of waking hours)  4 - Bedbound (Completely disabled. Cannot carry on any self-care. Totally confined to bed or chair)  5 - Death   Eustace Pen MM, Creech RH, Tormey DC, et al. [phone]). "Toxicity and response criteria of the [facility] Group". [place] Oncol. 5 (6): 649-55       LABORATORY DATA:  Lab Results  Component Value Date   WBC 8.2 11/06/2015   HGB 13.7 11/06/2015   HCT 38.9 11/06/2015   MCV 88.0 11/06/2015   PLT 308 11/06/2015   Lab Results  Component  Value Date   NA 136 12/31/2015   K 4.1 12/31/2015   CL 103 12/31/2015   CO2 26 12/31/2015   Lab Results  Component Value Date   ALT 13 11/06/2015   AST 15 11/06/2015   ALKPHOS 63 11/06/2015   BILITOT 0.57 11/06/2015      RADIOGRAPHY: No results found.     IMPRESSION:  1. ER/PR positive DCIS of the Left breast. Dr. Lisbeth Renshaw and I have met with the paitent and reviewed with her the  role of adjuvant radiation treatment in this setting. We discussed the potential benefit of radiation treatment, especially with regards to local control of the patient's tumor. We also discussed the possible side effects and risks of such a treatment as well.  Given the patient's performance status, and size of the tumor at over 4 cm which is intermediate grade, we discussed the options for 4 weeks of hypofractionated radiation in 20 fractions. She is interested in moving forward and we discussed the logistics and delivery as well as risks, benefits, short, and long term effects of treatment. She has healed from surgery  and thus will move forward with CT simulation on 01/24/16. Following completion of radiotherapy, she will meet back with Dr. Lindi Adie to discuss the use of anti-estrogen therapy.  The above documentation reflects my direct findings during this shared patient visit. Please see the separate note by Dr. Lisbeth Renshaw on this date for the remainder of the patient's plan of care.     Carola Rhine, PAC

## 2016-01-20 ENCOUNTER — Ambulatory Visit
Admission: RE | Admit: 2016-01-20 | Discharge: 2016-01-20 | Disposition: A | Discharge status: Home / Self Care | Payer: Medicare HMO | Source: Ambulatory Visit | Attending: Radiation Oncology | Admitting: Radiation Oncology

## 2016-01-20 ENCOUNTER — Encounter: Payer: Self-pay | Admitting: Radiation Oncology

## 2016-01-20 VITALS — BP 134/103 | HR 72 | Temp 98.4°F | Resp 16 | Ht 62.0 in | Wt 155.5 lb

## 2016-01-20 DIAGNOSIS — E785 Hyperlipidemia, unspecified: Secondary | ICD-10-CM | POA: Diagnosis not present

## 2016-01-20 DIAGNOSIS — Z79899 Other long term (current) drug therapy: Secondary | ICD-10-CM | POA: Insufficient documentation

## 2016-01-20 DIAGNOSIS — Z825 Family history of asthma and other chronic lower respiratory diseases: Secondary | ICD-10-CM | POA: Insufficient documentation

## 2016-01-20 DIAGNOSIS — Z8249 Family history of ischemic heart disease and other diseases of the circulatory system: Secondary | ICD-10-CM | POA: Diagnosis not present

## 2016-01-20 DIAGNOSIS — Z7951 Long term (current) use of inhaled steroids: Secondary | ICD-10-CM | POA: Insufficient documentation

## 2016-01-20 DIAGNOSIS — Z510 Encounter for antineoplastic radiation therapy: Secondary | ICD-10-CM | POA: Diagnosis not present

## 2016-01-20 DIAGNOSIS — J45909 Unspecified asthma, uncomplicated: Secondary | ICD-10-CM | POA: Diagnosis not present

## 2016-01-20 DIAGNOSIS — Z888 Allergy status to other drugs, medicaments and biological substances status: Secondary | ICD-10-CM | POA: Insufficient documentation

## 2016-01-20 DIAGNOSIS — I10 Essential (primary) hypertension: Secondary | ICD-10-CM | POA: Diagnosis not present

## 2016-01-20 DIAGNOSIS — C50312 Malignant neoplasm of lower-inner quadrant of left female breast: Secondary | ICD-10-CM

## 2016-01-20 DIAGNOSIS — D0512 Intraductal carcinoma in situ of left breast: Secondary | ICD-10-CM | POA: Insufficient documentation

## 2016-01-20 DIAGNOSIS — Z170 Estrogen receptor positive status [ER+]: Secondary | ICD-10-CM | POA: Diagnosis not present

## 2016-01-20 DIAGNOSIS — Z880 Allergy status to penicillin: Secondary | ICD-10-CM | POA: Insufficient documentation

## 2016-01-20 DIAGNOSIS — Z882 Allergy status to sulfonamides status: Secondary | ICD-10-CM | POA: Insufficient documentation

## 2016-01-20 DIAGNOSIS — Z9889 Other specified postprocedural states: Secondary | ICD-10-CM | POA: Diagnosis not present

## 2016-01-20 DIAGNOSIS — Z808 Family history of malignant neoplasm of other organs or systems: Secondary | ICD-10-CM | POA: Diagnosis not present

## 2016-01-20 NOTE — Addendum Note (Signed)
Encounter addended by: Benn Moulder, RN on: 01/20/2016  5:51 PM<BR>    Actions taken: Visit Navigator Flowsheet section accepted

## 2016-01-20 NOTE — Addendum Note (Signed)
Encounter addended by: Benn Moulder, RN on: 01/20/2016  2:49 PM<BR>    Actions taken: Charge Capture section accepted

## 2016-01-24 ENCOUNTER — Ambulatory Visit
Admission: RE | Admit: 2016-01-24 | Discharge: 2016-01-24 | Disposition: A | Discharge status: Home / Self Care | Payer: Medicare HMO | Source: Ambulatory Visit | Attending: Radiation Oncology | Admitting: Radiation Oncology

## 2016-01-24 DIAGNOSIS — Z7951 Long term (current) use of inhaled steroids: Secondary | ICD-10-CM | POA: Diagnosis not present

## 2016-01-24 DIAGNOSIS — C50312 Malignant neoplasm of lower-inner quadrant of left female breast: Secondary | ICD-10-CM | POA: Diagnosis not present

## 2016-01-24 DIAGNOSIS — Z825 Family history of asthma and other chronic lower respiratory diseases: Secondary | ICD-10-CM | POA: Diagnosis not present

## 2016-01-24 DIAGNOSIS — Z510 Encounter for antineoplastic radiation therapy: Secondary | ICD-10-CM | POA: Diagnosis not present

## 2016-01-24 DIAGNOSIS — J45909 Unspecified asthma, uncomplicated: Secondary | ICD-10-CM | POA: Diagnosis not present

## 2016-01-24 DIAGNOSIS — D0512 Intraductal carcinoma in situ of left breast: Secondary | ICD-10-CM | POA: Diagnosis not present

## 2016-01-24 DIAGNOSIS — Z8249 Family history of ischemic heart disease and other diseases of the circulatory system: Secondary | ICD-10-CM | POA: Diagnosis not present

## 2016-01-24 DIAGNOSIS — Z79899 Other long term (current) drug therapy: Secondary | ICD-10-CM | POA: Diagnosis not present

## 2016-01-24 DIAGNOSIS — I10 Essential (primary) hypertension: Secondary | ICD-10-CM | POA: Diagnosis not present

## 2016-01-24 DIAGNOSIS — E785 Hyperlipidemia, unspecified: Secondary | ICD-10-CM | POA: Diagnosis not present

## 2016-01-24 DIAGNOSIS — Z170 Estrogen receptor positive status [ER+]: Secondary | ICD-10-CM | POA: Diagnosis not present

## 2016-01-27 NOTE — Progress Notes (Signed)
  Radiation Oncology         (336) [phone] ________________________________  Name: Lisa Greene MRN: [id number]  Date: 01/24/2016  DOB: [birth year]   DIAGNOSIS:     ICD-9-CM ICD-10-CM   1. Breast cancer of lower-inner quadrant of left female breast ([place]) 174.3 C50.312     SIMULATION AND TREATMENT PLANNING NOTE  The patient presented for simulation prior to beginning her course of radiation treatment for her diagnosis of Left-sided breast cancer. The patient was placed in a supine position on a breast board. A customized vac-lock bag was constructed and this complex treatment device will be used on a daily basis during her treatment. In this fashion, a CT scan was obtained through the chest area and an isocenter was placed near the chest wall within the breast.  The patient will be planned to receive a course of radiation initially to a dose of 42.5 Gy. This will consist of a whole breast radiotherapy technique. To accomplish this, 2 customized blocks have been designed which will correspond to medial and lateral whole breast tangent fields. This treatment will be accomplished at 2.5 Gy per fraction. A forward planning technique will also be evaluated to determine if this approach improves the plan. It is anticipated that the patient will then receive a 7.5 Gy boost to the seroma cavity which has been contoured. This will be accomplished at 2.5 Gy per fraction.   This initial treatment will consist of a 3-D conformal technique. The seroma has been contoured as the primary target structure. Additionally, dose volume histograms of both this target as well as the lungs and heart will also be evaluated. Such an approach is necessary to ensure that the target area is adequately covered while the nearby critical  normal structures are adequately spared.  Plan:  The final anticipated total dose therefore will correspond to 50 Gy.   Special treatment procedure was performed today due to the extra time  and effort required by myself to plan and prepare this patient for deep inspiration breath hold technique.  I have determined cardiac sparing to be of benefit to this patient to prevent long term cardiac damage due to radiation of the heart.  Bellows were placed on the patient's abdomen. To facilitate cardiac sparing, the patient was coached by the radiation therapists on breath hold techniques and breathing practice was performed. Practice waveforms were obtained. The patient was then scanned while maintaining breath hold in the treatment position.  This image was then transferred over to the imaging specialist. The imaging specialist then created a fusion of the free breathing and breath hold scans using the chest wall as the stable structure. I personally reviewed the fusion in axial, coronal and sagittal image planes.  Excellent cardiac sparing was obtained.  I felt the patient is an appropriate candidate for breath hold and the patient will be treated as such.  The image fusion was then reviewed with the patient to reinforce the necessity of reproducible breath hold.      _______________________________   Jodelle Gross, MD, PhD

## 2016-01-27 NOTE — Progress Notes (Signed)
  Radiation Oncology         [phone] ________________________________  Name: Lisa Greene MRN: [id number]  Date: 01/24/2016  DOB: [birth year]  Optical Surface Tracking Plan:  Since intensity modulated radiotherapy (IMRT) and 3D conformal radiation treatment methods are predicated on accurate and precise positioning for treatment, intrafraction motion monitoring is medically necessary to ensure accurate and safe treatment delivery.  The ability to quantify intrafraction motion without excessive ionizing radiation dose can only be performed with optical surface tracking. Accordingly, surface imaging offers the opportunity to obtain 3D measurements of patient position throughout IMRT and 3D treatments without excessive radiation exposure.  I am ordering optical surface tracking for this patient's upcoming course of radiotherapy. ________________________________  Kyung Rudd, MD 01/27/2016 10:08 AM    Reference:   Particia Jasper, et al. Surface imaging-based analysis of intrafraction motion for breast radiotherapy patients.Journal of [place], n. 6, nov. 2014. ISSN [id number].   Available at: <http://www.jacmp.org/index.php/jacmp/article/view/4957>.

## 2016-01-31 DIAGNOSIS — D0512 Intraductal carcinoma in situ of left breast: Secondary | ICD-10-CM | POA: Diagnosis not present

## 2016-01-31 DIAGNOSIS — I10 Essential (primary) hypertension: Secondary | ICD-10-CM | POA: Diagnosis not present

## 2016-01-31 DIAGNOSIS — Z825 Family history of asthma and other chronic lower respiratory diseases: Secondary | ICD-10-CM | POA: Diagnosis not present

## 2016-01-31 DIAGNOSIS — Z510 Encounter for antineoplastic radiation therapy: Secondary | ICD-10-CM | POA: Diagnosis not present

## 2016-01-31 DIAGNOSIS — J45909 Unspecified asthma, uncomplicated: Secondary | ICD-10-CM | POA: Diagnosis not present

## 2016-01-31 DIAGNOSIS — C50312 Malignant neoplasm of lower-inner quadrant of left female breast: Secondary | ICD-10-CM | POA: Diagnosis not present

## 2016-01-31 DIAGNOSIS — E785 Hyperlipidemia, unspecified: Secondary | ICD-10-CM | POA: Diagnosis not present

## 2016-01-31 DIAGNOSIS — Z79899 Other long term (current) drug therapy: Secondary | ICD-10-CM | POA: Diagnosis not present

## 2016-01-31 DIAGNOSIS — Z170 Estrogen receptor positive status [ER+]: Secondary | ICD-10-CM | POA: Diagnosis not present

## 2016-01-31 DIAGNOSIS — Z7951 Long term (current) use of inhaled steroids: Secondary | ICD-10-CM | POA: Diagnosis not present

## 2016-01-31 DIAGNOSIS — Z8249 Family history of ischemic heart disease and other diseases of the circulatory system: Secondary | ICD-10-CM | POA: Diagnosis not present

## 2016-02-04 ENCOUNTER — Ambulatory Visit
Admission: RE | Admit: 2016-02-04 | Discharge: 2016-02-04 | Disposition: A | Discharge status: Home / Self Care | Payer: Medicare HMO | Source: Ambulatory Visit | Attending: Radiation Oncology | Admitting: Radiation Oncology

## 2016-02-04 DIAGNOSIS — C50312 Malignant neoplasm of lower-inner quadrant of left female breast: Secondary | ICD-10-CM

## 2016-02-04 DIAGNOSIS — Z8249 Family history of ischemic heart disease and other diseases of the circulatory system: Secondary | ICD-10-CM | POA: Diagnosis not present

## 2016-02-04 DIAGNOSIS — E785 Hyperlipidemia, unspecified: Secondary | ICD-10-CM | POA: Diagnosis not present

## 2016-02-04 DIAGNOSIS — D0512 Intraductal carcinoma in situ of left breast: Secondary | ICD-10-CM | POA: Diagnosis not present

## 2016-02-04 DIAGNOSIS — Z7951 Long term (current) use of inhaled steroids: Secondary | ICD-10-CM | POA: Diagnosis not present

## 2016-02-04 DIAGNOSIS — Z510 Encounter for antineoplastic radiation therapy: Secondary | ICD-10-CM | POA: Diagnosis not present

## 2016-02-04 DIAGNOSIS — Z170 Estrogen receptor positive status [ER+]: Secondary | ICD-10-CM | POA: Diagnosis not present

## 2016-02-04 DIAGNOSIS — Z825 Family history of asthma and other chronic lower respiratory diseases: Secondary | ICD-10-CM | POA: Diagnosis not present

## 2016-02-04 DIAGNOSIS — J45909 Unspecified asthma, uncomplicated: Secondary | ICD-10-CM | POA: Diagnosis not present

## 2016-02-04 DIAGNOSIS — I10 Essential (primary) hypertension: Secondary | ICD-10-CM | POA: Diagnosis not present

## 2016-02-04 DIAGNOSIS — Z79899 Other long term (current) drug therapy: Secondary | ICD-10-CM | POA: Diagnosis not present

## 2016-02-04 MED ORDER — ALRA NON-METALLIC DEODORANT (RAD-ONC)
1.0000 "application " | Freq: Once | TOPICAL | Status: AC
  Administered 2016-02-04: 1 via TOPICAL

## 2016-02-04 MED ORDER — RADIAPLEXRX EX GEL
Freq: Once | CUTANEOUS | Status: AC
  Administered 2016-02-04: 15:00:00 via TOPICAL

## 2016-02-04 NOTE — Progress Notes (Signed)
Patient education done, radiation therapy and you book, my business card, alra deodorant,radiaplex gel given to patient, discussed ways to manage side efects, skin irritation,swelling/sorenes breast, fatigue,,pain, use skin products after rad txs and bedtime, increase protein in diet, stay hydrated,drink plenty fluids,water, use of electric shaver , lukewarm shower/.baths, verbal understanding, teach back given 2:50 PM'

## 2016-02-05 ENCOUNTER — Ambulatory Visit
Admission: RE | Admit: 2016-02-05 | Discharge: 2016-02-05 | Disposition: A | Discharge status: Home / Self Care | Payer: Medicare HMO | Source: Ambulatory Visit | Attending: Radiation Oncology | Admitting: Radiation Oncology

## 2016-02-05 ENCOUNTER — Encounter: Payer: Self-pay | Admitting: Radiation Oncology

## 2016-02-05 VITALS — BP 140/67 | HR 74 | Temp 98.4°F | Resp 18 | Ht 62.0 in | Wt 155.4 lb

## 2016-02-05 DIAGNOSIS — Z510 Encounter for antineoplastic radiation therapy: Secondary | ICD-10-CM | POA: Diagnosis not present

## 2016-02-05 DIAGNOSIS — Z825 Family history of asthma and other chronic lower respiratory diseases: Secondary | ICD-10-CM | POA: Diagnosis not present

## 2016-02-05 DIAGNOSIS — Z79899 Other long term (current) drug therapy: Secondary | ICD-10-CM | POA: Diagnosis not present

## 2016-02-05 DIAGNOSIS — Z170 Estrogen receptor positive status [ER+]: Secondary | ICD-10-CM | POA: Diagnosis not present

## 2016-02-05 DIAGNOSIS — Z8249 Family history of ischemic heart disease and other diseases of the circulatory system: Secondary | ICD-10-CM | POA: Diagnosis not present

## 2016-02-05 DIAGNOSIS — C50312 Malignant neoplasm of lower-inner quadrant of left female breast: Secondary | ICD-10-CM | POA: Diagnosis not present

## 2016-02-05 DIAGNOSIS — D0512 Intraductal carcinoma in situ of left breast: Secondary | ICD-10-CM | POA: Diagnosis not present

## 2016-02-05 DIAGNOSIS — Z7951 Long term (current) use of inhaled steroids: Secondary | ICD-10-CM | POA: Diagnosis not present

## 2016-02-05 DIAGNOSIS — I10 Essential (primary) hypertension: Secondary | ICD-10-CM | POA: Diagnosis not present

## 2016-02-05 DIAGNOSIS — J45909 Unspecified asthma, uncomplicated: Secondary | ICD-10-CM | POA: Diagnosis not present

## 2016-02-05 DIAGNOSIS — E785 Hyperlipidemia, unspecified: Secondary | ICD-10-CM | POA: Diagnosis not present

## 2016-02-05 NOTE — Progress Notes (Signed)
Department of Radiation Oncology  Phone:  [phone] Fax:        [phone]  Weekly Treatment Note    Name: Lisa Greene Date: 02/05/2016 MRN: [id number] DOB: 1940-11-11   Diagnosis:     ICD-9-CM ICD-10-CM   1. Breast cancer of lower-inner quadrant of left female breast ([facility]) 174.3 C50.312      Current dose: 2.5 Gy  Current fraction: 1   MEDICATIONS: Current Outpatient Prescriptions  Medication Sig Dispense Refill  . Calcium-Vitamin D-Vitamin K (VIACTIV PO) Take by mouth.    . cholecalciferol (VITAMIN D) 1000 UNITS tablet Take 2,000 Units by mouth daily.    . fluticasone furoate-vilanterol (BREO ELLIPTA) 100-25 MCG/INH AEPB Inhale 1 puff into the lungs daily.    . hyaluronate sodium (RADIAPLEXRX) GEL Apply 1 application topically 2 (two) times daily.    . hydrochlorothiazide (HYDRODIURIL) 25 MG tablet TAKE 1 TABLET (25 MG TOTAL) BY MOUTH DAILY. 90 tablet 3  . losartan (COZAAR) 100 MG tablet TAKE 1 TABLET (100 MG TOTAL) BY MOUTH DAILY. 30 tablet 4  . metoprolol succinate (TOPROL-XL) 25 MG 24 hr tablet TAKE 1 TABLET (25 MG TOTAL) BY MOUTH DAILY. 90 tablet 3  . montelukast (SINGULAIR) 10 MG tablet Take 1 tablet by mouth at bedtime.    . niacin 500 MG tablet Take 500 mg by mouth at bedtime.    . non-metallic deodorant Jethro Poling) MISC Apply 1 application topically daily.    . Omega-3 Fatty Acids (FISH OIL) 1000 MG CAPS Take 1,000 mg by mouth Nightly.    . Albuterol Sulfate (PROAIR RESPICLICK) 123XX123 (90 BASE) MCG/ACT AEPB Inhale 1 puff into the lungs every 8 (eight) hours as needed. (Patient not taking: Reported on 02/05/2016) 1 each 0  . fluticasone (FLONASE) 50 MCG/ACT nasal spray Place 1 spray into both nostrils daily as needed.     Marland Kitchen HYDROcodone-acetaminophen (NORCO) 10-325 MG tablet Take 1 tablet by mouth every 6 (six) hours as needed. (Patient not taking: Reported on 02/05/2016) 10 tablet 0   No current facility-administered medications for this encounter.      ALLERGIES:  Lisinopril; Penicillins; Pork-derived products; and Sulfonamide derivatives   LABORATORY DATA:  Lab Results  Component Value Date   WBC 8.2 11/06/2015   HGB 13.7 11/06/2015   HCT 38.9 11/06/2015   MCV 88.0 11/06/2015   PLT 308 11/06/2015   Lab Results  Component Value Date   NA 136 12/31/2015   K 4.1 12/31/2015   CL 103 12/31/2015   CO2 26 12/31/2015   Lab Results  Component Value Date   ALT 13 11/06/2015   AST 15 11/06/2015   ALKPHOS 63 11/06/2015   BILITOT 0.57 11/06/2015     NARRATIVE: LIZVETTE CARMICKLE was seen today for weekly treatment management. The chart was checked and the patient's films were reviewed.  Mrs. Aceituno has received 1 fraction to her left breast.  Skin to left breast intact with normal skin color, using Radiaplex gel bid.  Appetite is good.  Denies pain and fatigue. BP 140/67 (BP Location: Right Arm, Patient Position: Sitting, Cuff Size: Normal)   Pulse 74   Temp 98.4 F (36.9 C) (Oral)   Resp 18   Ht 5\' 2"  (1.575 m)   Wt 155 lb 6.4 oz (70.5 kg)   SpO2 100%   BMI 28.42 kg/m   PHYSICAL EXAMINATION: height is 5\' 2"  (1.575 m) and weight is 155 lb 6.4 oz (70.5 kg). Her oral temperature is 98.4 F (36.9 C).  Her blood pressure is 140/67 and her pulse is 74. Her respiration is 18 and oxygen saturation is 100%.        ASSESSMENT: The patient is doing satisfactorily with treatment.  PLAN: We will continue with the patient's radiation treatment as planned.

## 2016-02-05 NOTE — Progress Notes (Signed)
Lisa Greene has received 1 fraction to her left breast.  Skin to left breast intact with normal skin color, using Radiaplex gel bid.  Appetite is good.  Denies pain and fatigue. BP 140/67 (BP Location: Right Arm, Patient Position: Sitting, Cuff Size: Normal)   Pulse 74   Temp 98.4 F (36.9 C) (Oral)   Resp 18   Ht 5\' 2"  (1.575 m)   Wt 155 lb 6.4 oz (70.5 kg)   SpO2 100%   BMI 28.42 kg/m

## 2016-02-06 ENCOUNTER — Ambulatory Visit
Admission: RE | Admit: 2016-02-06 | Discharge: 2016-02-06 | Disposition: A | Discharge status: Home / Self Care | Payer: Medicare HMO | Source: Ambulatory Visit | Attending: Radiation Oncology | Admitting: Radiation Oncology

## 2016-02-06 DIAGNOSIS — I10 Essential (primary) hypertension: Secondary | ICD-10-CM | POA: Diagnosis not present

## 2016-02-06 DIAGNOSIS — Z8249 Family history of ischemic heart disease and other diseases of the circulatory system: Secondary | ICD-10-CM | POA: Diagnosis not present

## 2016-02-06 DIAGNOSIS — E785 Hyperlipidemia, unspecified: Secondary | ICD-10-CM | POA: Diagnosis not present

## 2016-02-06 DIAGNOSIS — Z7951 Long term (current) use of inhaled steroids: Secondary | ICD-10-CM | POA: Diagnosis not present

## 2016-02-06 DIAGNOSIS — Z79899 Other long term (current) drug therapy: Secondary | ICD-10-CM | POA: Diagnosis not present

## 2016-02-06 DIAGNOSIS — Z510 Encounter for antineoplastic radiation therapy: Secondary | ICD-10-CM | POA: Diagnosis not present

## 2016-02-06 DIAGNOSIS — Z170 Estrogen receptor positive status [ER+]: Secondary | ICD-10-CM | POA: Diagnosis not present

## 2016-02-06 DIAGNOSIS — J45909 Unspecified asthma, uncomplicated: Secondary | ICD-10-CM | POA: Diagnosis not present

## 2016-02-06 DIAGNOSIS — D0512 Intraductal carcinoma in situ of left breast: Secondary | ICD-10-CM | POA: Diagnosis not present

## 2016-02-06 DIAGNOSIS — C50312 Malignant neoplasm of lower-inner quadrant of left female breast: Secondary | ICD-10-CM | POA: Diagnosis not present

## 2016-02-06 DIAGNOSIS — Z825 Family history of asthma and other chronic lower respiratory diseases: Secondary | ICD-10-CM | POA: Diagnosis not present

## 2016-02-07 ENCOUNTER — Ambulatory Visit
Admission: RE | Admit: 2016-02-07 | Discharge: 2016-02-07 | Disposition: A | Discharge status: Home / Self Care | Payer: Medicare HMO | Source: Ambulatory Visit | Attending: Radiation Oncology | Admitting: Radiation Oncology

## 2016-02-07 DIAGNOSIS — Z8249 Family history of ischemic heart disease and other diseases of the circulatory system: Secondary | ICD-10-CM | POA: Diagnosis not present

## 2016-02-07 DIAGNOSIS — Z79899 Other long term (current) drug therapy: Secondary | ICD-10-CM | POA: Diagnosis not present

## 2016-02-07 DIAGNOSIS — J45909 Unspecified asthma, uncomplicated: Secondary | ICD-10-CM | POA: Diagnosis not present

## 2016-02-07 DIAGNOSIS — Z510 Encounter for antineoplastic radiation therapy: Secondary | ICD-10-CM | POA: Diagnosis not present

## 2016-02-07 DIAGNOSIS — D0512 Intraductal carcinoma in situ of left breast: Secondary | ICD-10-CM | POA: Diagnosis not present

## 2016-02-07 DIAGNOSIS — E785 Hyperlipidemia, unspecified: Secondary | ICD-10-CM | POA: Diagnosis not present

## 2016-02-07 DIAGNOSIS — Z7951 Long term (current) use of inhaled steroids: Secondary | ICD-10-CM | POA: Diagnosis not present

## 2016-02-07 DIAGNOSIS — I10 Essential (primary) hypertension: Secondary | ICD-10-CM | POA: Diagnosis not present

## 2016-02-07 DIAGNOSIS — Z170 Estrogen receptor positive status [ER+]: Secondary | ICD-10-CM | POA: Diagnosis not present

## 2016-02-07 DIAGNOSIS — C50312 Malignant neoplasm of lower-inner quadrant of left female breast: Secondary | ICD-10-CM | POA: Diagnosis not present

## 2016-02-07 DIAGNOSIS — Z825 Family history of asthma and other chronic lower respiratory diseases: Secondary | ICD-10-CM | POA: Diagnosis not present

## 2016-02-10 ENCOUNTER — Ambulatory Visit
Admission: RE | Admit: 2016-02-10 | Discharge: 2016-02-10 | Disposition: A | Discharge status: Home / Self Care | Payer: Medicare HMO | Source: Ambulatory Visit | Attending: Radiation Oncology | Admitting: Radiation Oncology

## 2016-02-10 DIAGNOSIS — Z825 Family history of asthma and other chronic lower respiratory diseases: Secondary | ICD-10-CM | POA: Diagnosis not present

## 2016-02-10 DIAGNOSIS — Z8249 Family history of ischemic heart disease and other diseases of the circulatory system: Secondary | ICD-10-CM | POA: Diagnosis not present

## 2016-02-10 DIAGNOSIS — Z79899 Other long term (current) drug therapy: Secondary | ICD-10-CM | POA: Diagnosis not present

## 2016-02-10 DIAGNOSIS — J45909 Unspecified asthma, uncomplicated: Secondary | ICD-10-CM | POA: Diagnosis not present

## 2016-02-10 DIAGNOSIS — I10 Essential (primary) hypertension: Secondary | ICD-10-CM | POA: Diagnosis not present

## 2016-02-10 DIAGNOSIS — Z170 Estrogen receptor positive status [ER+]: Secondary | ICD-10-CM | POA: Diagnosis not present

## 2016-02-10 DIAGNOSIS — E785 Hyperlipidemia, unspecified: Secondary | ICD-10-CM | POA: Diagnosis not present

## 2016-02-10 DIAGNOSIS — D0512 Intraductal carcinoma in situ of left breast: Secondary | ICD-10-CM | POA: Diagnosis not present

## 2016-02-10 DIAGNOSIS — Z7951 Long term (current) use of inhaled steroids: Secondary | ICD-10-CM | POA: Diagnosis not present

## 2016-02-10 DIAGNOSIS — C50312 Malignant neoplasm of lower-inner quadrant of left female breast: Secondary | ICD-10-CM | POA: Diagnosis not present

## 2016-02-10 DIAGNOSIS — Z510 Encounter for antineoplastic radiation therapy: Secondary | ICD-10-CM | POA: Diagnosis not present

## 2016-02-11 ENCOUNTER — Ambulatory Visit
Admission: RE | Admit: 2016-02-11 | Discharge: 2016-02-11 | Disposition: A | Discharge status: Home / Self Care | Payer: Medicare HMO | Source: Ambulatory Visit | Attending: Radiation Oncology | Admitting: Radiation Oncology

## 2016-02-11 DIAGNOSIS — Z170 Estrogen receptor positive status [ER+]: Secondary | ICD-10-CM | POA: Diagnosis not present

## 2016-02-11 DIAGNOSIS — Z7951 Long term (current) use of inhaled steroids: Secondary | ICD-10-CM | POA: Diagnosis not present

## 2016-02-11 DIAGNOSIS — D0512 Intraductal carcinoma in situ of left breast: Secondary | ICD-10-CM | POA: Diagnosis not present

## 2016-02-11 DIAGNOSIS — Z79899 Other long term (current) drug therapy: Secondary | ICD-10-CM | POA: Diagnosis not present

## 2016-02-11 DIAGNOSIS — Z8249 Family history of ischemic heart disease and other diseases of the circulatory system: Secondary | ICD-10-CM | POA: Diagnosis not present

## 2016-02-11 DIAGNOSIS — I10 Essential (primary) hypertension: Secondary | ICD-10-CM | POA: Diagnosis not present

## 2016-02-11 DIAGNOSIS — Z825 Family history of asthma and other chronic lower respiratory diseases: Secondary | ICD-10-CM | POA: Diagnosis not present

## 2016-02-11 DIAGNOSIS — C50312 Malignant neoplasm of lower-inner quadrant of left female breast: Secondary | ICD-10-CM | POA: Diagnosis not present

## 2016-02-11 DIAGNOSIS — J45909 Unspecified asthma, uncomplicated: Secondary | ICD-10-CM | POA: Diagnosis not present

## 2016-02-11 DIAGNOSIS — E785 Hyperlipidemia, unspecified: Secondary | ICD-10-CM | POA: Diagnosis not present

## 2016-02-11 DIAGNOSIS — Z510 Encounter for antineoplastic radiation therapy: Secondary | ICD-10-CM | POA: Diagnosis not present

## 2016-02-12 ENCOUNTER — Ambulatory Visit
Admission: RE | Admit: 2016-02-12 | Discharge: 2016-02-12 | Disposition: A | Discharge status: Home / Self Care | Payer: Medicare HMO | Source: Ambulatory Visit | Attending: Radiation Oncology | Admitting: Radiation Oncology

## 2016-02-12 ENCOUNTER — Telehealth: Payer: Self-pay | Admitting: *Deleted

## 2016-02-12 DIAGNOSIS — Z7951 Long term (current) use of inhaled steroids: Secondary | ICD-10-CM | POA: Diagnosis not present

## 2016-02-12 DIAGNOSIS — Z170 Estrogen receptor positive status [ER+]: Secondary | ICD-10-CM | POA: Diagnosis not present

## 2016-02-12 DIAGNOSIS — D0512 Intraductal carcinoma in situ of left breast: Secondary | ICD-10-CM | POA: Diagnosis not present

## 2016-02-12 DIAGNOSIS — I10 Essential (primary) hypertension: Secondary | ICD-10-CM | POA: Diagnosis not present

## 2016-02-12 DIAGNOSIS — Z8249 Family history of ischemic heart disease and other diseases of the circulatory system: Secondary | ICD-10-CM | POA: Diagnosis not present

## 2016-02-12 DIAGNOSIS — E785 Hyperlipidemia, unspecified: Secondary | ICD-10-CM | POA: Diagnosis not present

## 2016-02-12 DIAGNOSIS — Z510 Encounter for antineoplastic radiation therapy: Secondary | ICD-10-CM | POA: Diagnosis not present

## 2016-02-12 DIAGNOSIS — C50312 Malignant neoplasm of lower-inner quadrant of left female breast: Secondary | ICD-10-CM | POA: Diagnosis not present

## 2016-02-12 DIAGNOSIS — Z79899 Other long term (current) drug therapy: Secondary | ICD-10-CM | POA: Diagnosis not present

## 2016-02-12 DIAGNOSIS — Z825 Family history of asthma and other chronic lower respiratory diseases: Secondary | ICD-10-CM | POA: Diagnosis not present

## 2016-02-12 DIAGNOSIS — J45909 Unspecified asthma, uncomplicated: Secondary | ICD-10-CM | POA: Diagnosis not present

## 2016-02-12 NOTE — Telephone Encounter (Signed)
  Oncology Nurse Navigator Documentation  Navigator Location: [facility] (02/12/16 0900) Navigator Encounter Type: Telephone (02/12/16 0900) Telephone: [facility] Crocker Call (02/12/16 0900)         Patient Visit Type: RadOnc (02/12/16 0900) Treatment Phase: First Radiation Tx (02/12/16 0900)                            Time Spent with Patient: 15 (02/12/16 0900)

## 2016-02-13 ENCOUNTER — Ambulatory Visit
Admission: RE | Admit: 2016-02-13 | Discharge: 2016-02-13 | Disposition: A | Discharge status: Home / Self Care | Payer: Medicare HMO | Source: Ambulatory Visit | Attending: Radiation Oncology | Admitting: Radiation Oncology

## 2016-02-13 DIAGNOSIS — Z8249 Family history of ischemic heart disease and other diseases of the circulatory system: Secondary | ICD-10-CM | POA: Diagnosis not present

## 2016-02-13 DIAGNOSIS — J45909 Unspecified asthma, uncomplicated: Secondary | ICD-10-CM | POA: Diagnosis not present

## 2016-02-13 DIAGNOSIS — Z825 Family history of asthma and other chronic lower respiratory diseases: Secondary | ICD-10-CM | POA: Diagnosis not present

## 2016-02-13 DIAGNOSIS — Z170 Estrogen receptor positive status [ER+]: Secondary | ICD-10-CM | POA: Diagnosis not present

## 2016-02-13 DIAGNOSIS — E785 Hyperlipidemia, unspecified: Secondary | ICD-10-CM | POA: Diagnosis not present

## 2016-02-13 DIAGNOSIS — Z79899 Other long term (current) drug therapy: Secondary | ICD-10-CM | POA: Diagnosis not present

## 2016-02-13 DIAGNOSIS — I10 Essential (primary) hypertension: Secondary | ICD-10-CM | POA: Diagnosis not present

## 2016-02-13 DIAGNOSIS — D0512 Intraductal carcinoma in situ of left breast: Secondary | ICD-10-CM | POA: Diagnosis not present

## 2016-02-13 DIAGNOSIS — C50312 Malignant neoplasm of lower-inner quadrant of left female breast: Secondary | ICD-10-CM | POA: Diagnosis not present

## 2016-02-13 DIAGNOSIS — Z510 Encounter for antineoplastic radiation therapy: Secondary | ICD-10-CM | POA: Diagnosis not present

## 2016-02-13 DIAGNOSIS — Z7951 Long term (current) use of inhaled steroids: Secondary | ICD-10-CM | POA: Diagnosis not present

## 2016-02-14 ENCOUNTER — Ambulatory Visit
Admission: RE | Admit: 2016-02-14 | Discharge: 2016-02-14 | Disposition: A | Discharge status: Home / Self Care | Payer: Medicare HMO | Source: Ambulatory Visit | Attending: Radiation Oncology | Admitting: Radiation Oncology

## 2016-02-14 ENCOUNTER — Encounter: Payer: Self-pay | Admitting: Radiation Oncology

## 2016-02-14 VITALS — BP 149/66 | HR 59 | Temp 98.9°F | Ht 62.0 in | Wt 155.8 lb

## 2016-02-14 DIAGNOSIS — Z510 Encounter for antineoplastic radiation therapy: Secondary | ICD-10-CM | POA: Diagnosis not present

## 2016-02-14 DIAGNOSIS — D0512 Intraductal carcinoma in situ of left breast: Secondary | ICD-10-CM | POA: Diagnosis not present

## 2016-02-14 DIAGNOSIS — Z825 Family history of asthma and other chronic lower respiratory diseases: Secondary | ICD-10-CM | POA: Diagnosis not present

## 2016-02-14 DIAGNOSIS — E785 Hyperlipidemia, unspecified: Secondary | ICD-10-CM | POA: Diagnosis not present

## 2016-02-14 DIAGNOSIS — J45909 Unspecified asthma, uncomplicated: Secondary | ICD-10-CM | POA: Diagnosis not present

## 2016-02-14 DIAGNOSIS — C50312 Malignant neoplasm of lower-inner quadrant of left female breast: Secondary | ICD-10-CM

## 2016-02-14 DIAGNOSIS — Z8249 Family history of ischemic heart disease and other diseases of the circulatory system: Secondary | ICD-10-CM | POA: Diagnosis not present

## 2016-02-14 DIAGNOSIS — Z170 Estrogen receptor positive status [ER+]: Secondary | ICD-10-CM | POA: Diagnosis not present

## 2016-02-14 DIAGNOSIS — I10 Essential (primary) hypertension: Secondary | ICD-10-CM | POA: Diagnosis not present

## 2016-02-14 DIAGNOSIS — Z7951 Long term (current) use of inhaled steroids: Secondary | ICD-10-CM | POA: Diagnosis not present

## 2016-02-14 DIAGNOSIS — Z79899 Other long term (current) drug therapy: Secondary | ICD-10-CM | POA: Diagnosis not present

## 2016-02-14 NOTE — Progress Notes (Signed)
   Department of Radiation Oncology  Phone:  [phone] Fax:        [phone]  Weekly Treatment Note    Name: Lisa Greene Date: 02/14/2016 MRN: [id number] DOB: [birth year]   Diagnosis:     ICD-9-CM ICD-10-CM   1. Breast cancer of lower-inner quadrant of left female breast ([facility]) 174.3 C50.312      Current dose: 20 Gy  Current fraction: 8   MEDICATIONS: Current Outpatient Prescriptions  Medication Sig Dispense Refill  . Albuterol Sulfate (PROAIR RESPICLICK) 123XX123 (90 BASE) MCG/ACT AEPB Inhale 1 puff into the lungs every 8 (eight) hours as needed. 1 each 0  . cholecalciferol (VITAMIN D) 1000 UNITS tablet Take 2,000 Units by mouth daily.    . fluticasone (FLONASE) 50 MCG/ACT nasal spray Place 1 spray into both nostrils daily as needed.     . fluticasone furoate-vilanterol (BREO ELLIPTA) 100-25 MCG/INH AEPB Inhale 1 puff into the lungs daily.    . hyaluronate sodium (RADIAPLEXRX) GEL Apply 1 application topically 2 (two) times daily.    . hydrochlorothiazide (HYDRODIURIL) 25 MG tablet TAKE 1 TABLET (25 MG TOTAL) BY MOUTH DAILY. 90 tablet 3  . losartan (COZAAR) 100 MG tablet TAKE 1 TABLET (100 MG TOTAL) BY MOUTH DAILY. 30 tablet 4  . metoprolol succinate (TOPROL-XL) 25 MG 24 hr tablet TAKE 1 TABLET (25 MG TOTAL) BY MOUTH DAILY. 90 tablet 3  . montelukast (SINGULAIR) 10 MG tablet Take 1 tablet by mouth at bedtime.    . niacin 500 MG tablet Take 500 mg by mouth at bedtime.    . non-metallic deodorant Jethro Poling) MISC Apply 1 application topically daily.    . Omega-3 Fatty Acids (FISH OIL) 1000 MG CAPS Take 1,000 mg by mouth Nightly.     No current facility-administered medications for this encounter.      ALLERGIES: Lisinopril; Penicillins; Pork-derived products; and Sulfonamide derivatives   LABORATORY DATA:  Lab Results  Component Value Date   WBC 8.2 11/06/2015   HGB 13.7 11/06/2015   HCT 38.9 11/06/2015   MCV 88.0 11/06/2015   PLT 308 11/06/2015   Lab Results    Component Value Date   NA 136 12/31/2015   K 4.1 12/31/2015   CL 103 12/31/2015   CO2 26 12/31/2015   Lab Results  Component Value Date   ALT 13 11/06/2015   AST 15 11/06/2015   ALKPHOS 63 11/06/2015   BILITOT 0.57 11/06/2015     NARRATIVE: Lisa Greene was seen today for weekly treatment management. The chart was checked and the patient's films were reviewed.  Lisa Greene has received 8 fractions to her right breast.  She reports soreness in the inner portion of her right breast.  Skin in tact with mild erythema.  Denies  Fatigue.  PHYSICAL EXAMINATION: height is 5\' 2"  (1.575 m) and weight is 155 lb 12.8 oz (70.7 kg). Her oral temperature is 98.9 F (37.2 C). Her blood pressure is 149/66 (abnormal) and her pulse is 59 (abnormal).      Minimal skin change currently  ASSESSMENT: The patient is doing satisfactorily with treatment.  PLAN: We will continue with the patient's radiation treatment as planned.

## 2016-02-14 NOTE — Progress Notes (Signed)
Ms. Ransone has received 8 fractions to her right breast.  She reports soreness in the inner portion of her right breast.  Skin in tact with mild erythema.  Denies  Fatigue.

## 2016-02-17 ENCOUNTER — Ambulatory Visit
Admission: RE | Admit: 2016-02-17 | Discharge: 2016-02-17 | Disposition: A | Discharge status: Home / Self Care | Payer: Medicare HMO | Source: Ambulatory Visit | Attending: Radiation Oncology | Admitting: Radiation Oncology

## 2016-02-17 DIAGNOSIS — Z825 Family history of asthma and other chronic lower respiratory diseases: Secondary | ICD-10-CM | POA: Diagnosis not present

## 2016-02-17 DIAGNOSIS — Z510 Encounter for antineoplastic radiation therapy: Secondary | ICD-10-CM | POA: Diagnosis not present

## 2016-02-17 DIAGNOSIS — Z8249 Family history of ischemic heart disease and other diseases of the circulatory system: Secondary | ICD-10-CM | POA: Diagnosis not present

## 2016-02-17 DIAGNOSIS — Z170 Estrogen receptor positive status [ER+]: Secondary | ICD-10-CM | POA: Diagnosis not present

## 2016-02-17 DIAGNOSIS — Z79899 Other long term (current) drug therapy: Secondary | ICD-10-CM | POA: Diagnosis not present

## 2016-02-17 DIAGNOSIS — E785 Hyperlipidemia, unspecified: Secondary | ICD-10-CM | POA: Diagnosis not present

## 2016-02-17 DIAGNOSIS — D0512 Intraductal carcinoma in situ of left breast: Secondary | ICD-10-CM | POA: Diagnosis not present

## 2016-02-17 DIAGNOSIS — I10 Essential (primary) hypertension: Secondary | ICD-10-CM | POA: Diagnosis not present

## 2016-02-17 DIAGNOSIS — Z7951 Long term (current) use of inhaled steroids: Secondary | ICD-10-CM | POA: Diagnosis not present

## 2016-02-17 DIAGNOSIS — C50312 Malignant neoplasm of lower-inner quadrant of left female breast: Secondary | ICD-10-CM | POA: Diagnosis not present

## 2016-02-17 DIAGNOSIS — J45909 Unspecified asthma, uncomplicated: Secondary | ICD-10-CM | POA: Diagnosis not present

## 2016-02-18 ENCOUNTER — Ambulatory Visit
Admission: RE | Admit: 2016-02-18 | Discharge: 2016-02-18 | Disposition: A | Discharge status: Home / Self Care | Payer: Medicare HMO | Source: Ambulatory Visit | Attending: Radiation Oncology | Admitting: Radiation Oncology

## 2016-02-18 DIAGNOSIS — Z79899 Other long term (current) drug therapy: Secondary | ICD-10-CM | POA: Diagnosis not present

## 2016-02-18 DIAGNOSIS — Z825 Family history of asthma and other chronic lower respiratory diseases: Secondary | ICD-10-CM | POA: Diagnosis not present

## 2016-02-18 DIAGNOSIS — D0512 Intraductal carcinoma in situ of left breast: Secondary | ICD-10-CM | POA: Diagnosis not present

## 2016-02-18 DIAGNOSIS — Z170 Estrogen receptor positive status [ER+]: Secondary | ICD-10-CM | POA: Diagnosis not present

## 2016-02-18 DIAGNOSIS — Z7951 Long term (current) use of inhaled steroids: Secondary | ICD-10-CM | POA: Diagnosis not present

## 2016-02-18 DIAGNOSIS — C50312 Malignant neoplasm of lower-inner quadrant of left female breast: Secondary | ICD-10-CM | POA: Diagnosis not present

## 2016-02-18 DIAGNOSIS — J45909 Unspecified asthma, uncomplicated: Secondary | ICD-10-CM | POA: Diagnosis not present

## 2016-02-18 DIAGNOSIS — Z8249 Family history of ischemic heart disease and other diseases of the circulatory system: Secondary | ICD-10-CM | POA: Diagnosis not present

## 2016-02-18 DIAGNOSIS — E785 Hyperlipidemia, unspecified: Secondary | ICD-10-CM | POA: Diagnosis not present

## 2016-02-18 DIAGNOSIS — I10 Essential (primary) hypertension: Secondary | ICD-10-CM | POA: Diagnosis not present

## 2016-02-18 DIAGNOSIS — Z510 Encounter for antineoplastic radiation therapy: Secondary | ICD-10-CM | POA: Diagnosis not present

## 2016-02-19 ENCOUNTER — Ambulatory Visit
Admission: RE | Admit: 2016-02-19 | Discharge: 2016-02-19 | Disposition: A | Discharge status: Home / Self Care | Payer: Medicare HMO | Source: Ambulatory Visit | Attending: Radiation Oncology | Admitting: Radiation Oncology

## 2016-02-19 DIAGNOSIS — Z170 Estrogen receptor positive status [ER+]: Secondary | ICD-10-CM | POA: Diagnosis not present

## 2016-02-19 DIAGNOSIS — Z79899 Other long term (current) drug therapy: Secondary | ICD-10-CM | POA: Diagnosis not present

## 2016-02-19 DIAGNOSIS — C50312 Malignant neoplasm of lower-inner quadrant of left female breast: Secondary | ICD-10-CM | POA: Diagnosis not present

## 2016-02-19 DIAGNOSIS — Z7951 Long term (current) use of inhaled steroids: Secondary | ICD-10-CM | POA: Diagnosis not present

## 2016-02-19 DIAGNOSIS — D0512 Intraductal carcinoma in situ of left breast: Secondary | ICD-10-CM | POA: Diagnosis not present

## 2016-02-19 DIAGNOSIS — J45909 Unspecified asthma, uncomplicated: Secondary | ICD-10-CM | POA: Diagnosis not present

## 2016-02-19 DIAGNOSIS — E785 Hyperlipidemia, unspecified: Secondary | ICD-10-CM | POA: Diagnosis not present

## 2016-02-19 DIAGNOSIS — I10 Essential (primary) hypertension: Secondary | ICD-10-CM | POA: Diagnosis not present

## 2016-02-19 DIAGNOSIS — Z510 Encounter for antineoplastic radiation therapy: Secondary | ICD-10-CM | POA: Diagnosis not present

## 2016-02-19 DIAGNOSIS — Z825 Family history of asthma and other chronic lower respiratory diseases: Secondary | ICD-10-CM | POA: Diagnosis not present

## 2016-02-19 DIAGNOSIS — Z8249 Family history of ischemic heart disease and other diseases of the circulatory system: Secondary | ICD-10-CM | POA: Diagnosis not present

## 2016-02-20 ENCOUNTER — Ambulatory Visit
Admission: RE | Admit: 2016-02-20 | Discharge: 2016-02-20 | Disposition: A | Discharge status: Home / Self Care | Payer: Medicare HMO | Source: Ambulatory Visit | Attending: Radiation Oncology | Admitting: Radiation Oncology

## 2016-02-20 DIAGNOSIS — C50312 Malignant neoplasm of lower-inner quadrant of left female breast: Secondary | ICD-10-CM | POA: Diagnosis not present

## 2016-02-20 DIAGNOSIS — Z79899 Other long term (current) drug therapy: Secondary | ICD-10-CM | POA: Diagnosis not present

## 2016-02-20 DIAGNOSIS — I10 Essential (primary) hypertension: Secondary | ICD-10-CM | POA: Diagnosis not present

## 2016-02-20 DIAGNOSIS — Z170 Estrogen receptor positive status [ER+]: Secondary | ICD-10-CM | POA: Diagnosis not present

## 2016-02-20 DIAGNOSIS — Z510 Encounter for antineoplastic radiation therapy: Secondary | ICD-10-CM | POA: Diagnosis not present

## 2016-02-20 DIAGNOSIS — Z825 Family history of asthma and other chronic lower respiratory diseases: Secondary | ICD-10-CM | POA: Diagnosis not present

## 2016-02-20 DIAGNOSIS — D0512 Intraductal carcinoma in situ of left breast: Secondary | ICD-10-CM | POA: Diagnosis not present

## 2016-02-20 DIAGNOSIS — J45909 Unspecified asthma, uncomplicated: Secondary | ICD-10-CM | POA: Diagnosis not present

## 2016-02-20 DIAGNOSIS — Z7951 Long term (current) use of inhaled steroids: Secondary | ICD-10-CM | POA: Diagnosis not present

## 2016-02-20 DIAGNOSIS — E785 Hyperlipidemia, unspecified: Secondary | ICD-10-CM | POA: Diagnosis not present

## 2016-02-20 DIAGNOSIS — Z8249 Family history of ischemic heart disease and other diseases of the circulatory system: Secondary | ICD-10-CM | POA: Diagnosis not present

## 2016-02-21 ENCOUNTER — Ambulatory Visit
Admission: RE | Admit: 2016-02-21 | Discharge: 2016-02-21 | Disposition: A | Discharge status: Home / Self Care | Payer: Medicare HMO | Source: Ambulatory Visit | Attending: Radiation Oncology | Admitting: Radiation Oncology

## 2016-02-21 ENCOUNTER — Encounter: Payer: Self-pay | Admitting: Radiation Oncology

## 2016-02-21 VITALS — BP 138/63 | HR 61 | Temp 97.9°F | Resp 20 | Wt 153.6 lb

## 2016-02-21 DIAGNOSIS — I10 Essential (primary) hypertension: Secondary | ICD-10-CM | POA: Diagnosis not present

## 2016-02-21 DIAGNOSIS — Z510 Encounter for antineoplastic radiation therapy: Secondary | ICD-10-CM | POA: Diagnosis not present

## 2016-02-21 DIAGNOSIS — Z8249 Family history of ischemic heart disease and other diseases of the circulatory system: Secondary | ICD-10-CM | POA: Diagnosis not present

## 2016-02-21 DIAGNOSIS — E785 Hyperlipidemia, unspecified: Secondary | ICD-10-CM | POA: Diagnosis not present

## 2016-02-21 DIAGNOSIS — J45909 Unspecified asthma, uncomplicated: Secondary | ICD-10-CM | POA: Diagnosis not present

## 2016-02-21 DIAGNOSIS — C50312 Malignant neoplasm of lower-inner quadrant of left female breast: Secondary | ICD-10-CM

## 2016-02-21 DIAGNOSIS — Z170 Estrogen receptor positive status [ER+]: Secondary | ICD-10-CM | POA: Diagnosis not present

## 2016-02-21 DIAGNOSIS — Z7951 Long term (current) use of inhaled steroids: Secondary | ICD-10-CM | POA: Diagnosis not present

## 2016-02-21 DIAGNOSIS — Z825 Family history of asthma and other chronic lower respiratory diseases: Secondary | ICD-10-CM | POA: Diagnosis not present

## 2016-02-21 DIAGNOSIS — D0512 Intraductal carcinoma in situ of left breast: Secondary | ICD-10-CM | POA: Diagnosis not present

## 2016-02-21 DIAGNOSIS — Z79899 Other long term (current) drug therapy: Secondary | ICD-10-CM | POA: Diagnosis not present

## 2016-02-21 NOTE — Progress Notes (Signed)
Weekly rad txs    Left  Breast 13/20 completed, mild erythema , using radiaplex bid, no c/o pain, appetite good, no fatigue 10:41 AM BP 138/63 (BP Location: Right Arm, Patient Position: Sitting, Cuff Size: Normal)   Pulse 61   Temp 97.9 F (36.6 C) (Oral)   Resp 20   Wt 153 lb 9.6 oz (69.7 kg)   BMI 28.09 kg/m   Wt Readings from Last 3 Encounters:  02/21/16 153 lb 9.6 oz (69.7 kg)  02/14/16 155 lb 12.8 oz (70.7 kg)  02/05/16 155 lb 6.4 oz (70.5 kg)

## 2016-02-21 NOTE — Progress Notes (Signed)
   Department of Radiation Oncology  Phone:  [phone] Fax:        [phone]  Weekly Treatment Note    Name: Lisa Greene Date: 02/21/2016 MRN: [id number] DOB: 1940/10/10   Diagnosis:  No diagnosis found.   Current dose: 32.5 Gy  Current fraction:13   MEDICATIONS: Current Outpatient Prescriptions  Medication Sig Dispense Refill  . cholecalciferol (VITAMIN D) 1000 UNITS tablet Take 2,000 Units by mouth daily.    . fluticasone (FLONASE) 50 MCG/ACT nasal spray Place 1 spray into both nostrils daily as needed.     . fluticasone furoate-vilanterol (BREO ELLIPTA) 100-25 MCG/INH AEPB Inhale 1 puff into the lungs daily.    . hyaluronate sodium (RADIAPLEXRX) GEL Apply 1 application topically 2 (two) times daily.    . hydrochlorothiazide (HYDRODIURIL) 25 MG tablet TAKE 1 TABLET (25 MG TOTAL) BY MOUTH DAILY. 90 tablet 3  . losartan (COZAAR) 100 MG tablet TAKE 1 TABLET (100 MG TOTAL) BY MOUTH DAILY. 30 tablet 4  . metoprolol succinate (TOPROL-XL) 25 MG 24 hr tablet TAKE 1 TABLET (25 MG TOTAL) BY MOUTH DAILY. 90 tablet 3  . montelukast (SINGULAIR) 10 MG tablet Take 1 tablet by mouth at bedtime.    . niacin 500 MG tablet Take 500 mg by mouth at bedtime.    . non-metallic deodorant Jethro Poling) MISC Apply 1 application topically daily.    . Omega-3 Fatty Acids (FISH OIL) 1000 MG CAPS Take 1,000 mg by mouth Nightly.    . Albuterol Sulfate (PROAIR RESPICLICK) 123XX123 (90 BASE) MCG/ACT AEPB Inhale 1 puff into the lungs every 8 (eight) hours as needed. (Patient not taking: Reported on 02/21/2016) 1 each 0   No current facility-administered medications for this encounter.      ALLERGIES: Lisinopril; Penicillins; Pork-derived products; and Sulfonamide derivatives   LABORATORY DATA:  Lab Results  Component Value Date   WBC 8.2 11/06/2015   HGB 13.7 11/06/2015   HCT 38.9 11/06/2015   MCV 88.0 11/06/2015   PLT 308 11/06/2015   Lab Results  Component Value Date   NA 136 12/31/2015   K 4.1  12/31/2015   CL 103 12/31/2015   CO2 26 12/31/2015   Lab Results  Component Value Date   ALT 13 11/06/2015   AST 15 11/06/2015   ALKPHOS 63 11/06/2015   BILITOT 0.57 11/06/2015     NARRATIVE: Lisa Greene was seen today for weekly treatment management. The chart was checked and the patient's films were reviewed.  Patient presents with mild erythema. She is using Radiaplex bid. She denies pain. She reports a good appetite and no fatigue.  PHYSICAL EXAMINATION: weight is 153 lb 9.6 oz (69.7 kg). Her oral temperature is 97.9 F (36.6 C). Her blood pressure is 138/63 and her pulse is 61. Her respiration is 20.     Patient has mild dermatitis present. Overall, skin looks excellent in the treatment area.   ASSESSMENT: The patient is doing satisfactorily with treatment.  PLAN: We will continue with the patient's radiation treatment as planned.        This document serves as a record of services personally performed by Kyung Rudd, MD. It was created on his behalf by Bethann Humble, a trained medical scribe. The creation of this record is based on the scribe's personal observations and the provider's statements to them. This document has been checked and approved by the attending provider.

## 2016-02-24 ENCOUNTER — Ambulatory Visit
Admission: RE | Admit: 2016-02-24 | Discharge: 2016-02-24 | Disposition: A | Discharge status: Home / Self Care | Payer: Medicare HMO | Source: Ambulatory Visit | Attending: Radiation Oncology | Admitting: Radiation Oncology

## 2016-02-24 DIAGNOSIS — C50312 Malignant neoplasm of lower-inner quadrant of left female breast: Secondary | ICD-10-CM | POA: Diagnosis not present

## 2016-02-24 DIAGNOSIS — Z7951 Long term (current) use of inhaled steroids: Secondary | ICD-10-CM | POA: Diagnosis not present

## 2016-02-24 DIAGNOSIS — D0512 Intraductal carcinoma in situ of left breast: Secondary | ICD-10-CM | POA: Diagnosis not present

## 2016-02-24 DIAGNOSIS — J45909 Unspecified asthma, uncomplicated: Secondary | ICD-10-CM | POA: Diagnosis not present

## 2016-02-24 DIAGNOSIS — Z825 Family history of asthma and other chronic lower respiratory diseases: Secondary | ICD-10-CM | POA: Diagnosis not present

## 2016-02-24 DIAGNOSIS — I10 Essential (primary) hypertension: Secondary | ICD-10-CM | POA: Diagnosis not present

## 2016-02-24 DIAGNOSIS — E785 Hyperlipidemia, unspecified: Secondary | ICD-10-CM | POA: Diagnosis not present

## 2016-02-24 DIAGNOSIS — Z79899 Other long term (current) drug therapy: Secondary | ICD-10-CM | POA: Diagnosis not present

## 2016-02-24 DIAGNOSIS — Z510 Encounter for antineoplastic radiation therapy: Secondary | ICD-10-CM | POA: Diagnosis not present

## 2016-02-24 DIAGNOSIS — Z8249 Family history of ischemic heart disease and other diseases of the circulatory system: Secondary | ICD-10-CM | POA: Diagnosis not present

## 2016-02-24 DIAGNOSIS — Z170 Estrogen receptor positive status [ER+]: Secondary | ICD-10-CM | POA: Diagnosis not present

## 2016-02-25 ENCOUNTER — Ambulatory Visit
Admission: RE | Admit: 2016-02-25 | Discharge: 2016-02-25 | Disposition: A | Discharge status: Home / Self Care | Payer: Medicare HMO | Source: Ambulatory Visit | Attending: Radiation Oncology | Admitting: Radiation Oncology

## 2016-02-25 ENCOUNTER — Encounter: Payer: Self-pay | Admitting: Radiation Oncology

## 2016-02-25 DIAGNOSIS — Z170 Estrogen receptor positive status [ER+]: Secondary | ICD-10-CM | POA: Diagnosis not present

## 2016-02-25 DIAGNOSIS — Z510 Encounter for antineoplastic radiation therapy: Secondary | ICD-10-CM | POA: Diagnosis not present

## 2016-02-25 DIAGNOSIS — I10 Essential (primary) hypertension: Secondary | ICD-10-CM | POA: Diagnosis not present

## 2016-02-25 DIAGNOSIS — E785 Hyperlipidemia, unspecified: Secondary | ICD-10-CM | POA: Diagnosis not present

## 2016-02-25 DIAGNOSIS — J45909 Unspecified asthma, uncomplicated: Secondary | ICD-10-CM | POA: Diagnosis not present

## 2016-02-25 DIAGNOSIS — Z79899 Other long term (current) drug therapy: Secondary | ICD-10-CM | POA: Diagnosis not present

## 2016-02-25 DIAGNOSIS — Z7951 Long term (current) use of inhaled steroids: Secondary | ICD-10-CM | POA: Diagnosis not present

## 2016-02-25 DIAGNOSIS — D0512 Intraductal carcinoma in situ of left breast: Secondary | ICD-10-CM | POA: Diagnosis not present

## 2016-02-25 DIAGNOSIS — C50312 Malignant neoplasm of lower-inner quadrant of left female breast: Secondary | ICD-10-CM | POA: Diagnosis not present

## 2016-02-25 DIAGNOSIS — Z8249 Family history of ischemic heart disease and other diseases of the circulatory system: Secondary | ICD-10-CM | POA: Diagnosis not present

## 2016-02-25 DIAGNOSIS — Z825 Family history of asthma and other chronic lower respiratory diseases: Secondary | ICD-10-CM | POA: Diagnosis not present

## 2016-02-26 ENCOUNTER — Ambulatory Visit
Admission: RE | Admit: 2016-02-26 | Discharge: 2016-02-26 | Disposition: A | Discharge status: Home / Self Care | Payer: Medicare HMO | Source: Ambulatory Visit | Attending: Radiation Oncology | Admitting: Radiation Oncology

## 2016-02-26 ENCOUNTER — Other Ambulatory Visit: Payer: Self-pay | Admitting: Family Medicine

## 2016-02-26 DIAGNOSIS — E785 Hyperlipidemia, unspecified: Secondary | ICD-10-CM | POA: Diagnosis not present

## 2016-02-26 DIAGNOSIS — I10 Essential (primary) hypertension: Secondary | ICD-10-CM | POA: Diagnosis not present

## 2016-02-26 DIAGNOSIS — Z79899 Other long term (current) drug therapy: Secondary | ICD-10-CM | POA: Diagnosis not present

## 2016-02-26 DIAGNOSIS — D0512 Intraductal carcinoma in situ of left breast: Secondary | ICD-10-CM | POA: Diagnosis not present

## 2016-02-26 DIAGNOSIS — Z825 Family history of asthma and other chronic lower respiratory diseases: Secondary | ICD-10-CM | POA: Diagnosis not present

## 2016-02-26 DIAGNOSIS — Z8249 Family history of ischemic heart disease and other diseases of the circulatory system: Secondary | ICD-10-CM | POA: Diagnosis not present

## 2016-02-26 DIAGNOSIS — C50312 Malignant neoplasm of lower-inner quadrant of left female breast: Secondary | ICD-10-CM | POA: Diagnosis not present

## 2016-02-26 DIAGNOSIS — J45909 Unspecified asthma, uncomplicated: Secondary | ICD-10-CM | POA: Diagnosis not present

## 2016-02-26 DIAGNOSIS — Z170 Estrogen receptor positive status [ER+]: Secondary | ICD-10-CM | POA: Diagnosis not present

## 2016-02-26 DIAGNOSIS — Z7951 Long term (current) use of inhaled steroids: Secondary | ICD-10-CM | POA: Diagnosis not present

## 2016-02-26 DIAGNOSIS — Z510 Encounter for antineoplastic radiation therapy: Secondary | ICD-10-CM | POA: Diagnosis not present

## 2016-02-27 ENCOUNTER — Ambulatory Visit
Admission: RE | Admit: 2016-02-27 | Discharge: 2016-02-27 | Disposition: A | Discharge status: Home / Self Care | Payer: Medicare HMO | Source: Ambulatory Visit | Attending: Radiation Oncology | Admitting: Radiation Oncology

## 2016-02-27 DIAGNOSIS — Z170 Estrogen receptor positive status [ER+]: Secondary | ICD-10-CM | POA: Diagnosis not present

## 2016-02-27 DIAGNOSIS — Z8249 Family history of ischemic heart disease and other diseases of the circulatory system: Secondary | ICD-10-CM | POA: Diagnosis not present

## 2016-02-27 DIAGNOSIS — Z79899 Other long term (current) drug therapy: Secondary | ICD-10-CM | POA: Diagnosis not present

## 2016-02-27 DIAGNOSIS — I10 Essential (primary) hypertension: Secondary | ICD-10-CM | POA: Diagnosis not present

## 2016-02-27 DIAGNOSIS — C50312 Malignant neoplasm of lower-inner quadrant of left female breast: Secondary | ICD-10-CM | POA: Diagnosis not present

## 2016-02-27 DIAGNOSIS — Z7951 Long term (current) use of inhaled steroids: Secondary | ICD-10-CM | POA: Diagnosis not present

## 2016-02-27 DIAGNOSIS — Z825 Family history of asthma and other chronic lower respiratory diseases: Secondary | ICD-10-CM | POA: Diagnosis not present

## 2016-02-27 DIAGNOSIS — D0512 Intraductal carcinoma in situ of left breast: Secondary | ICD-10-CM | POA: Diagnosis not present

## 2016-02-27 DIAGNOSIS — Z510 Encounter for antineoplastic radiation therapy: Secondary | ICD-10-CM | POA: Diagnosis not present

## 2016-02-27 DIAGNOSIS — J45909 Unspecified asthma, uncomplicated: Secondary | ICD-10-CM | POA: Diagnosis not present

## 2016-02-27 DIAGNOSIS — E785 Hyperlipidemia, unspecified: Secondary | ICD-10-CM | POA: Diagnosis not present

## 2016-02-28 ENCOUNTER — Ambulatory Visit
Admission: RE | Admit: 2016-02-28 | Discharge: 2016-02-28 | Disposition: A | Discharge status: Home / Self Care | Payer: Medicare HMO | Source: Ambulatory Visit | Attending: Radiation Oncology | Admitting: Radiation Oncology

## 2016-02-28 ENCOUNTER — Encounter: Payer: Self-pay | Admitting: Radiation Oncology

## 2016-02-28 VITALS — BP 138/57 | HR 65 | Temp 98.6°F | Resp 16 | Wt 154.8 lb

## 2016-02-28 DIAGNOSIS — Z7951 Long term (current) use of inhaled steroids: Secondary | ICD-10-CM | POA: Diagnosis not present

## 2016-02-28 DIAGNOSIS — Z510 Encounter for antineoplastic radiation therapy: Secondary | ICD-10-CM | POA: Diagnosis not present

## 2016-02-28 DIAGNOSIS — Z8249 Family history of ischemic heart disease and other diseases of the circulatory system: Secondary | ICD-10-CM | POA: Diagnosis not present

## 2016-02-28 DIAGNOSIS — C50312 Malignant neoplasm of lower-inner quadrant of left female breast: Secondary | ICD-10-CM

## 2016-02-28 DIAGNOSIS — D0512 Intraductal carcinoma in situ of left breast: Secondary | ICD-10-CM | POA: Diagnosis not present

## 2016-02-28 DIAGNOSIS — Z825 Family history of asthma and other chronic lower respiratory diseases: Secondary | ICD-10-CM | POA: Diagnosis not present

## 2016-02-28 DIAGNOSIS — I10 Essential (primary) hypertension: Secondary | ICD-10-CM | POA: Diagnosis not present

## 2016-02-28 DIAGNOSIS — Z79899 Other long term (current) drug therapy: Secondary | ICD-10-CM | POA: Diagnosis not present

## 2016-02-28 DIAGNOSIS — J45909 Unspecified asthma, uncomplicated: Secondary | ICD-10-CM | POA: Diagnosis not present

## 2016-02-28 DIAGNOSIS — Z170 Estrogen receptor positive status [ER+]: Secondary | ICD-10-CM | POA: Diagnosis not present

## 2016-02-28 DIAGNOSIS — E785 Hyperlipidemia, unspecified: Secondary | ICD-10-CM | POA: Diagnosis not present

## 2016-02-28 NOTE — Progress Notes (Signed)
Weekly rad txs 18/20 left breast completed, mild dermatitis sen, skin intact, no itching, no pain, using radiaplex gel bid, appetite good, no fatigue stated, will mail f/u appt, patient aware 9:32 AM BP (!) 138/57 (BP Location: Right Arm, Patient Position: Sitting, Cuff Size: Normal)   Pulse 65   Temp 98.6 F (37 C) (Oral)   Resp 16   Wt 154 lb 12.8 oz (70.2 kg)   BMI 28.31 kg/m   Wt Readings from Last 3 Encounters:  02/28/16 154 lb 12.8 oz (70.2 kg)  02/21/16 153 lb 9.6 oz (69.7 kg)  02/14/16 155 lb 12.8 oz (70.7 kg)

## 2016-02-28 NOTE — Progress Notes (Signed)
Complex simulation note  Diagnosis: Left-sided breast cancer  Narrative The patient has initially been planned to receive a course of whole breast radiation to a dose of 42.5 Gy in 17 fractions. The patient will now receive an additional boost to the seroma cavity which has been contoured. This will correspond to a boost of 7.5 Gy at 2.5 Gy per fraction. To accomplish this, an additional 3 customized blocks have been designed for this purpose. A complex isodose plan is requested to ensure that the target area is adequately covered with radiation dose and that the nearby normal structures such as the lung are adequately spared. The patient's final total dose will be 50 Gy.  ------------------------------------------------  Lisa Naim [name], Lisa Greene, Lisa Greene 

## 2016-02-28 NOTE — Progress Notes (Signed)
Department of Radiation Oncology  Phone:  [phone] Fax:        [phone]  Weekly Treatment Note    Name: Lisa Greene Date: 02/28/2016 MRN: [id number] DOB: 1940-06-16   Diagnosis:     ICD-9-CM ICD-10-CM   1. Breast cancer of lower-inner quadrant of left female breast ([facility]) 174.3 C50.312      Current dose: 45 Gy  Current fraction: 18   MEDICATIONS: Current Outpatient Prescriptions  Medication Sig Dispense Refill  . cholecalciferol (VITAMIN D) 1000 UNITS tablet Take 2,000 Units by mouth daily.    . fluticasone (FLONASE) 50 MCG/ACT nasal spray Place 1 spray into both nostrils daily as needed.     . fluticasone furoate-vilanterol (BREO ELLIPTA) 100-25 MCG/INH AEPB Inhale 1 puff into the lungs daily.    . hyaluronate sodium (RADIAPLEXRX) GEL Apply 1 application topically 2 (two) times daily.    . hydrochlorothiazide (HYDRODIURIL) 25 MG tablet TAKE 1 TABLET (25 MG TOTAL) BY MOUTH DAILY. 90 tablet 3  . losartan (COZAAR) 100 MG tablet TAKE 1 TABLET (100 MG TOTAL) BY MOUTH DAILY. 30 tablet 3  . metoprolol succinate (TOPROL-XL) 25 MG 24 hr tablet TAKE 1 TABLET (25 MG TOTAL) BY MOUTH DAILY. 90 tablet 3  . montelukast (SINGULAIR) 10 MG tablet Take 1 tablet by mouth at bedtime.    . niacin 500 MG tablet Take 500 mg by mouth at bedtime.    . non-metallic deodorant Jethro Poling) MISC Apply 1 application topically daily.    . Omega-3 Fatty Acids (FISH OIL) 1000 MG CAPS Take 1,000 mg by mouth Nightly.    . Albuterol Sulfate (PROAIR RESPICLICK) 123XX123 (90 BASE) MCG/ACT AEPB Inhale 1 puff into the lungs every 8 (eight) hours as needed. (Patient not taking: Reported on 02/28/2016) 1 each 0   No current facility-administered medications for this encounter.      ALLERGIES: Lisinopril; Penicillins; Pork-derived products; and Sulfonamide derivatives   LABORATORY DATA:  Lab Results  Component Value Date   WBC 8.2 11/06/2015   HGB 13.7 11/06/2015   HCT 38.9 11/06/2015   MCV 88.0  11/06/2015   PLT 308 11/06/2015   Lab Results  Component Value Date   NA 136 12/31/2015   K 4.1 12/31/2015   CL 103 12/31/2015   CO2 26 12/31/2015   Lab Results  Component Value Date   ALT 13 11/06/2015   AST 15 11/06/2015   ALKPHOS 63 11/06/2015   BILITOT 0.57 11/06/2015     NARRATIVE: JUNO RIENTS was seen today for weekly treatment management. The chart was checked and the patient's films were reviewed.  Weekly rad txs 18/20 left breast completed, mild dermatitis sen, skin intact, no itching, no pain, using radiaplex gel bid, appetite good, no fatigue stated, will mail f/u appt, patient aware 10:23 AM BP (!) 138/57 (BP Location: Right Arm, Patient Position: Sitting, Cuff Size: Normal)   Pulse 65   Temp 98.6 F (37 C) (Oral)   Resp 16   Wt 154 lb 12.8 oz (70.2 kg)   BMI 28.31 kg/m   Wt Readings from Last 3 Encounters:  02/28/16 154 lb 12.8 oz (70.2 kg)  02/21/16 153 lb 9.6 oz (69.7 kg)  02/14/16 155 lb 12.8 oz (70.7 kg)    PHYSICAL EXAMINATION: weight is 154 lb 12.8 oz (70.2 kg). Her oral temperature is 98.6 F (37 C). Her blood pressure is 138/57 (abnormal) and her pulse is 65. Her respiration is 16.        ASSESSMENT:  The patient is doing satisfactorily with treatment.  PLAN: We will continue with the patient's radiation treatment as planned. The patient will follow-up in one month after completing her course of radiation. Overall she is done very well.

## 2016-03-02 ENCOUNTER — Ambulatory Visit
Admission: RE | Admit: 2016-03-02 | Discharge: 2016-03-02 | Disposition: A | Discharge status: Home / Self Care | Payer: Medicare HMO | Source: Ambulatory Visit | Attending: Radiation Oncology | Admitting: Radiation Oncology

## 2016-03-02 DIAGNOSIS — E785 Hyperlipidemia, unspecified: Secondary | ICD-10-CM | POA: Diagnosis not present

## 2016-03-02 DIAGNOSIS — C50312 Malignant neoplasm of lower-inner quadrant of left female breast: Secondary | ICD-10-CM | POA: Diagnosis not present

## 2016-03-02 DIAGNOSIS — Z825 Family history of asthma and other chronic lower respiratory diseases: Secondary | ICD-10-CM | POA: Diagnosis not present

## 2016-03-02 DIAGNOSIS — Z7951 Long term (current) use of inhaled steroids: Secondary | ICD-10-CM | POA: Diagnosis not present

## 2016-03-02 DIAGNOSIS — Z79899 Other long term (current) drug therapy: Secondary | ICD-10-CM | POA: Diagnosis not present

## 2016-03-02 DIAGNOSIS — D0512 Intraductal carcinoma in situ of left breast: Secondary | ICD-10-CM | POA: Diagnosis not present

## 2016-03-02 DIAGNOSIS — I10 Essential (primary) hypertension: Secondary | ICD-10-CM | POA: Diagnosis not present

## 2016-03-02 DIAGNOSIS — Z510 Encounter for antineoplastic radiation therapy: Secondary | ICD-10-CM | POA: Diagnosis not present

## 2016-03-02 DIAGNOSIS — J45909 Unspecified asthma, uncomplicated: Secondary | ICD-10-CM | POA: Diagnosis not present

## 2016-03-02 DIAGNOSIS — Z170 Estrogen receptor positive status [ER+]: Secondary | ICD-10-CM | POA: Diagnosis not present

## 2016-03-02 DIAGNOSIS — Z8249 Family history of ischemic heart disease and other diseases of the circulatory system: Secondary | ICD-10-CM | POA: Diagnosis not present

## 2016-03-02 MED ORDER — RADIAPLEXRX EX GEL
Freq: Once | CUTANEOUS | Status: AC
  Administered 2016-03-02: 10:00:00 via TOPICAL

## 2016-03-03 ENCOUNTER — Other Ambulatory Visit: Payer: Self-pay | Admitting: *Deleted

## 2016-03-03 ENCOUNTER — Ambulatory Visit
Admission: RE | Admit: 2016-03-03 | Discharge: 2016-03-03 | Disposition: A | Discharge status: Home / Self Care | Payer: Medicare HMO | Source: Ambulatory Visit | Attending: Radiation Oncology | Admitting: Radiation Oncology

## 2016-03-03 DIAGNOSIS — Z7951 Long term (current) use of inhaled steroids: Secondary | ICD-10-CM | POA: Diagnosis not present

## 2016-03-03 DIAGNOSIS — Z510 Encounter for antineoplastic radiation therapy: Secondary | ICD-10-CM | POA: Diagnosis not present

## 2016-03-03 DIAGNOSIS — D0512 Intraductal carcinoma in situ of left breast: Secondary | ICD-10-CM | POA: Diagnosis not present

## 2016-03-03 DIAGNOSIS — Z8249 Family history of ischemic heart disease and other diseases of the circulatory system: Secondary | ICD-10-CM | POA: Diagnosis not present

## 2016-03-03 DIAGNOSIS — Z79899 Other long term (current) drug therapy: Secondary | ICD-10-CM | POA: Diagnosis not present

## 2016-03-03 DIAGNOSIS — J45909 Unspecified asthma, uncomplicated: Secondary | ICD-10-CM | POA: Diagnosis not present

## 2016-03-03 DIAGNOSIS — C50312 Malignant neoplasm of lower-inner quadrant of left female breast: Secondary | ICD-10-CM

## 2016-03-03 DIAGNOSIS — E785 Hyperlipidemia, unspecified: Secondary | ICD-10-CM | POA: Diagnosis not present

## 2016-03-03 DIAGNOSIS — I10 Essential (primary) hypertension: Secondary | ICD-10-CM | POA: Diagnosis not present

## 2016-03-03 DIAGNOSIS — Z170 Estrogen receptor positive status [ER+]: Secondary | ICD-10-CM | POA: Diagnosis not present

## 2016-03-03 DIAGNOSIS — Z825 Family history of asthma and other chronic lower respiratory diseases: Secondary | ICD-10-CM | POA: Diagnosis not present

## 2016-03-11 ENCOUNTER — Encounter: Payer: Self-pay | Admitting: Radiation Oncology

## 2016-03-11 NOTE — Progress Notes (Signed)
°  Radiation Oncology         [phone] ________________________________  Name: Lisa Greene MRN: [id number]  Date: 03/11/2016  DOB: [birth year]  End of Treatment Note  Diagnosis:  Breast cancer of lower-inner quadrant of left female breast     Indication for treatment:  Curative       Radiation treatment dates:   02/05/16-03/03/16  Site/dose:   1)Left breast/ 42.5 Gy in 17 fx           2) Boost / 7.5 Gy in 3 fx  Beams/energy:   1) 3D / 10X,6X        2) Isodose Plan / 15X, 6 X  Narrative: The patient tolerated radiation treatment relatively well.   Patient presented with mild dermatitis, and no other symptoms.  Plan: The patient has completed radiation treatment. The patient will return to radiation oncology clinic for routine followup in one month. I advised them to call or return sooner if they have any questions or concerns related to their recovery or treatment.  ------------------------------------------------  Jodelle Gross, MD, PhD  This document serves as a record of services personally performed by Kyung Rudd, MD. It was created on his behalf by Bethann Humble, a trained medical scribe. The creation of this record is based on the scribe's personal observations and the provider's statements to them. This document has been checked and approved by the attending provider.

## 2016-03-12 ENCOUNTER — Telehealth: Payer: Self-pay | Admitting: Hematology and Oncology

## 2016-03-12 NOTE — Telephone Encounter (Signed)
Patient called to cancel appointment. 03/12/16

## 2016-03-13 ENCOUNTER — Ambulatory Visit: Payer: Medicare HMO | Admitting: Hematology and Oncology

## 2016-03-30 ENCOUNTER — Other Ambulatory Visit: Payer: Self-pay | Admitting: Family Medicine

## 2016-04-01 DIAGNOSIS — R69 Illness, unspecified: Secondary | ICD-10-CM | POA: Diagnosis not present

## 2016-04-03 ENCOUNTER — Encounter: Payer: Self-pay | Admitting: Family Medicine

## 2016-04-03 ENCOUNTER — Ambulatory Visit (INDEPENDENT_AMBULATORY_CARE_PROVIDER_SITE_OTHER): Payer: Medicare HMO | Admitting: Family Medicine

## 2016-04-03 VITALS — BP 120/64 | HR 69 | Temp 98.1°F | Wt 154.0 lb

## 2016-04-03 DIAGNOSIS — M549 Dorsalgia, unspecified: Secondary | ICD-10-CM | POA: Diagnosis not present

## 2016-04-03 MED ORDER — PREDNISONE 20 MG PO TABS: ORAL_TABLET | ORAL | 0 refills | Status: DC

## 2016-04-03 NOTE — Patient Instructions (Signed)
This could be coming from some arthritis in spine- trial lower dose of prednisone for 5-7 days  If no improvement- consider trial massage therapy  If no improvement- likely get chest x-ray (likely low yield) as well as thoracic spine films.   Mychart me if you get to last step and need the orders for x-ray. Reach out if have new or worsening symptoms

## 2016-04-03 NOTE — Progress Notes (Signed)
Subjective:  Lisa Greene is a 75 y.o. year old very pleasant female patient who presents for/with See problem oriented charting ROS- see ROS included below HPI/S.    Past Medical History-  Patient Active Problem List   Diagnosis Date Noted  . Breast cancer of lower-inner quadrant of left female breast ([place]) 11/01/2015    Priority: High  . CKD (chronic kidney disease), stage III 10/22/2015    Priority: Medium  . Asthma, moderate persistent, well-controlled 04/19/2014    Priority: Medium  . Hypertension 11/25/2010    Priority: Medium  . Hyperlipidemia 12/20/2006    Priority: Medium  . Family history of abdominal aortic aneurysm 03/08/2015    Priority: Low  . Eosinophilia 02/08/2009    Priority: Low  . Allergic rhinitis 01/31/2008    Priority: Low  . Osteopenia 12/20/2006    Priority: Low    Medications- reviewed and updated Current Outpatient Prescriptions  Medication Sig Dispense Refill  . Albuterol Sulfate (PROAIR RESPICLICK) 123XX123 (90 BASE) MCG/ACT AEPB Inhale 1 puff into the lungs every 8 (eight) hours as needed. (Patient not taking: Reported on 02/28/2016) 1 each 0  . cholecalciferol (VITAMIN D) 1000 UNITS tablet Take 2,000 Units by mouth daily.    . fluticasone (FLONASE) 50 MCG/ACT nasal spray Place 1 spray into both nostrils daily as needed.     . fluticasone furoate-vilanterol (BREO ELLIPTA) 100-25 MCG/INH AEPB Inhale 1 puff into the lungs daily.    . hyaluronate sodium (RADIAPLEXRX) GEL Apply 1 application topically 2 (two) times daily.    . hydrochlorothiazide (HYDRODIURIL) 25 MG tablet TAKE 1 TABLET (25 MG TOTAL) BY MOUTH DAILY. 90 tablet 3  . losartan (COZAAR) 100 MG tablet TAKE 1 TABLET (100 MG TOTAL) BY MOUTH DAILY. 30 tablet 3  . metoprolol succinate (TOPROL-XL) 25 MG 24 hr tablet TAKE 1 TABLET (25 MG TOTAL) BY MOUTH DAILY. 90 tablet 2  . montelukast (SINGULAIR) 10 MG tablet Take 1 tablet by mouth at bedtime.    . niacin 500 MG tablet Take 500 mg by mouth at  bedtime.    . Omega-3 Fatty Acids (FISH OIL) 1000 MG CAPS Take 1,000 mg by mouth Nightly.    . predniSONE (DELTASONE) 20 MG tablet Take 1 pill for 7 days 7 tablet 0   No current facility-administered medications for this visit.     Objective: BP 120/64 (BP Location: Left Arm, Patient Position: Sitting, Cuff Size: Normal)   Pulse 69   Temp 98.1 F (36.7 C) (Oral)   Wt 154 lb (69.9 kg)   SpO2 97%   BMI 28.17 kg/m  Gen: NAD, resting comfortably, well appearing CV: RRR no murmurs rubs or gallops Lungs: CTAB no crackles, wheeze, rhonchi Abdomen: soft/nontender/nondistended/normal bowel sounds. No rebound or guarding.  MSK: mild pain with deep palpation in right mid back just near shoulder blade but medial to it.  Neuro- no leg weakness, normal reflexes  Assessment/Plan:  Mid back pain on right side S:Pain right shoulder blade. If gets it to stop goes away for up to a week but then can come back for 3-4 weeks. Pain total 3-4 months now. Does not take any medicine for it when it bothers her. Lays down and pain goes away- when gets up it comes back. No shortness of breath with it. Feels kind of like a muscle. Bending forward helps. Does not get worse with picking things up.  ROS- no incontinence fecal or urinary, no weakness in extremity, no numbness/tingling in arms or legs  A/P: Unclear cause- could be deeper trigger point. Could be muscle spasm. Probability at patient age that arthritis involved is higher- given that sometimes it triggers with certain twisting and sometimes goes away with similar- wonder if facet joint arthritis. We will trial 5-7 days of prednisone. If fails, massage therapy. If fails, thoracic films- doubt compression fracture with intermittent pain - but does have osteoporosis. Doubt pneumonia but could consider CXR. DOubt PE without SOB though did recently get treated for DCIS.   Meds ordered this encounter  Medications  . predniSONE (DELTASONE) 20 MG tablet    Sig:  Take 1 pill for 7 days    Dispense:  7 tablet    Refill:  0    Return precautions advised.  Garret Reddish, MD

## 2016-04-10 ENCOUNTER — Encounter: Payer: Self-pay | Admitting: Family Medicine

## 2016-04-17 NOTE — Progress Notes (Signed)
[name] is here for a one month follow up appointment for of left breast.  Skin status: Normal color to left breast. What lotion are you using? Not using any lotion with vitamin E. Have you seen med onc? If not, when is appointment:12-13-15 Dr. Lindi Adie  Lisa Greene will  check on follow up appointment. If they are ER+, have they started Al or Tamoxifen? If not, why? Will start after next visit with Dr. Lindi Adie Discuss survivorship appointment. 06-10-16. Mike Craze, N.P. Have you had a mammogram scheduled? Not scheduled yet Offer referral to [facility]. Will receive at the survivorship appointment. Appetite:Good Pain:None Fatigue:None Arm mobility:Able to raise left arm without difficulty. Wt Readings from Last 3 Encounters:  04/21/16 154 lb (69.9 kg)  04/03/16 154 lb (69.9 kg)  02/28/16 154 lb 12.8 oz (70.2 kg)  BP 123/60   Pulse 70   Temp 98.6 F (37 C) (Oral)   Resp 18   Ht 5\' 2"  (1.575 m)   Wt 154 lb (69.9 kg)   SpO2 100%   BMI 28.17 kg/m

## 2016-04-21 ENCOUNTER — Encounter: Payer: Self-pay | Admitting: Radiation Oncology

## 2016-04-21 ENCOUNTER — Ambulatory Visit
Admission: RE | Admit: 2016-04-21 | Discharge: 2016-04-21 | Disposition: A | Discharge status: Home / Self Care | Payer: Medicare HMO | Source: Ambulatory Visit | Attending: Radiation Oncology | Admitting: Radiation Oncology

## 2016-04-21 VITALS — BP 123/60 | HR 70 | Temp 98.6°F | Resp 18 | Ht 62.0 in | Wt 154.0 lb

## 2016-04-21 DIAGNOSIS — Z923 Personal history of irradiation: Secondary | ICD-10-CM | POA: Insufficient documentation

## 2016-04-21 DIAGNOSIS — Z888 Allergy status to other drugs, medicaments and biological substances status: Secondary | ICD-10-CM | POA: Diagnosis not present

## 2016-04-21 DIAGNOSIS — Z882 Allergy status to sulfonamides status: Secondary | ICD-10-CM | POA: Diagnosis not present

## 2016-04-21 DIAGNOSIS — D0512 Intraductal carcinoma in situ of left breast: Secondary | ICD-10-CM | POA: Insufficient documentation

## 2016-04-21 DIAGNOSIS — Z880 Allergy status to penicillin: Secondary | ICD-10-CM | POA: Insufficient documentation

## 2016-04-21 DIAGNOSIS — Z79899 Other long term (current) drug therapy: Secondary | ICD-10-CM | POA: Insufficient documentation

## 2016-04-21 DIAGNOSIS — Z7951 Long term (current) use of inhaled steroids: Secondary | ICD-10-CM | POA: Insufficient documentation

## 2016-04-21 DIAGNOSIS — Z170 Estrogen receptor positive status [ER+]: Secondary | ICD-10-CM | POA: Insufficient documentation

## 2016-04-21 DIAGNOSIS — C50312 Malignant neoplasm of lower-inner quadrant of left female breast: Secondary | ICD-10-CM

## 2016-04-21 NOTE — Addendum Note (Signed)
Encounter addended by: Malena Edman, RN on: 04/21/2016  2:56 PM<BR>    Actions taken: Charge Capture section accepted

## 2016-04-21 NOTE — Progress Notes (Signed)
Radiation Oncology         [phone] ________________________________  Name: Lisa Greene MRN: [id number]  Date: 04/21/2016  DOB: November 06, 1940  Post Treatment Note  CC: Garret Reddish, MD  Nicholas Lose, MD  Diagnosis:  ER/PR positive DCIS of the left breast  Interval Since Last Radiation:  7 weeks   02/05/16-03/03/16:  1. Left breast/ 42.5 Gy in 17 fx 2. Boost / 7.5 Gy in 3 fx   Narrative:  The patient returns today for routine follow-up. During treatment she did very well with radiotherapy and did not have significant desquamation.                             On review of systems, the patient states she is feeling great and is looking forward to the thanksgiving holiday. She denies any concerns with her skin, shortness of breath, fevers or chills. She does not have any appointments currently with Dr. Lindi Adie to discuss hormone blockade. No other complaints are verbalized.  ALLERGIES:  is allergic to lisinopril; penicillins; pork-derived products; and sulfonamide derivatives.  Meds: Current Outpatient Prescriptions  Medication Sig Dispense Refill  . cholecalciferol (VITAMIN D) 1000 UNITS tablet Take 2,000 Units by mouth daily.    . fluticasone furoate-vilanterol (BREO ELLIPTA) 100-25 MCG/INH AEPB Inhale 1 puff into the lungs daily.    . hydrochlorothiazide (HYDRODIURIL) 25 MG tablet TAKE 1 TABLET (25 MG TOTAL) BY MOUTH DAILY. 90 tablet 3  . losartan (COZAAR) 100 MG tablet TAKE 1 TABLET (100 MG TOTAL) BY MOUTH DAILY. 30 tablet 3  . metoprolol succinate (TOPROL-XL) 25 MG 24 hr tablet TAKE 1 TABLET (25 MG TOTAL) BY MOUTH DAILY. 90 tablet 2  . montelukast (SINGULAIR) 10 MG tablet Take 1 tablet by mouth at bedtime.    . niacin 500 MG tablet Take 500 mg by mouth at bedtime.    . Omega-3 Fatty Acids (FISH OIL) 1000 MG CAPS Take 1,000 mg by mouth Nightly.    . Albuterol Sulfate (PROAIR RESPICLICK) 123XX123 (90 BASE) MCG/ACT AEPB Inhale 1 puff into the lungs every 8 (eight) hours as needed.  (Patient not taking: Reported on 04/21/2016) 1 each 0  . fluticasone (FLONASE) 50 MCG/ACT nasal spray Place 1 spray into both nostrils daily as needed.      No current facility-administered medications for this encounter.     Physical Findings:  height is 5\' 2"  (1.575 m) and weight is 154 lb (69.9 kg). Her oral temperature is 98.6 F (37 C). Her blood pressure is 123/60 and her pulse is 70. Her respiration is 18 and oxygen saturation is 100%.  In general this is a well appearing caucasian female in no acute distress. She's alert and oriented x4 and appropriate throughout the examination. Cardiopulmonary assessment is negative for acute distress and she exhibits normal effort. The left breast was examined and reveals no evidence of hyperpigmentation or desquamation. Her lumpectomy scar is well healed.   Lab Findings: Lab Results  Component Value Date   WBC 8.2 11/06/2015   HGB 13.7 11/06/2015   HCT 38.9 11/06/2015   MCV 88.0 11/06/2015   PLT 308 11/06/2015     Radiographic Findings: No results found.  Impression/Plan: 1. ER/PR positive DCIS of the left breast. The patient has been doing well since completion of radiotherapy. We discussed that we would be happy to continue to follow her as needed. She will need to see Dr. Lindi Adie in medical oncology. She  was counseled on skin care and was encouraged to use Vitamin E Oil or Vitamin E containing lotion for the next 6 months, as well as encouraged to consider sunscreen over the upper chest wall to avoid sunburn.  2. Survivorship. The patient will be seen in January 2018 by survivorship and we again reviewed the rationale for her to be seen in this setting.     Carola Rhine, PAC

## 2016-04-24 ENCOUNTER — Telehealth: Payer: Self-pay | Admitting: Hematology and Oncology

## 2016-04-24 NOTE — Telephone Encounter (Signed)
lvm to inform pt of 11/30 appt date/time per LOS

## 2016-04-29 NOTE — Assessment & Plan Note (Signed)
Left lumpectomy 11/28/2015: DCIS with calcifications, high-grade, 1.3 cm, focally less than 0.1 cm to posterior margin and focally less than 0.1 cm lateral margin, ER 95%, PR 80%, TisN0 stage 0  Adj XRT 02/05/16- 03/03/16 Current Treatment: Adj Tamoxifen 20 mg daily X 5 years  We discussed the risks and benefits of tamoxifen. These include but not limited to insomnia, hot flashes, mood changes, vaginal dryness, and weight gain. Although rare, serious side effects including endometrial cancer, risk of blood clots were also discussed. We strongly believe that the benefits far outweigh the risks. Patient understands these risks and consented to starting treatment. Planned treatment duration is 5 years.  RTC in 3 months for tox check

## 2016-04-30 ENCOUNTER — Encounter: Payer: Self-pay | Admitting: Hematology and Oncology

## 2016-04-30 ENCOUNTER — Ambulatory Visit (HOSPITAL_BASED_OUTPATIENT_CLINIC_OR_DEPARTMENT_OTHER): Payer: Medicare HMO | Admitting: Hematology and Oncology

## 2016-04-30 DIAGNOSIS — C50312 Malignant neoplasm of lower-inner quadrant of left female breast: Secondary | ICD-10-CM | POA: Diagnosis not present

## 2016-04-30 DIAGNOSIS — Z170 Estrogen receptor positive status [ER+]: Secondary | ICD-10-CM | POA: Diagnosis not present

## 2016-04-30 MED ORDER — TAMOXIFEN CITRATE 20 MG PO TABS: 20.0000 mg | ORAL_TABLET | Freq: Every day | ORAL | 3 refills | Status: DC

## 2016-04-30 NOTE — Progress Notes (Signed)
Patient Care Team: Marin Olp, MD as PCP - General (Family Medicine) Rolm Bookbinder, MD as Consulting Physician (General Surgery) Nicholas Lose, MD as Consulting Physician (Hematology and Oncology) Kyung Rudd, MD as Consulting Physician (Radiation Oncology)  DIAGNOSIS:  Encounter Diagnosis  Name Primary?  . Malignant neoplasm of lower-inner quadrant of left breast in female, estrogen receptor positive ([place])     SUMMARY OF ONCOLOGIC HISTORY:   Breast cancer of lower-inner quadrant of left female breast ([place])   10/30/2015 Initial Diagnosis    Left breast biopsy: DCIS intermediate grade with calcifications (micropapillary architecture), fibrocystic changes with adenosis, complex sclerosing lesion, UDH, ER 95%, PR 80%; mammogram in ultrasound showed right breast calcifications 4.1 cm      11/28/2015 Surgery    Left lumpectomy: DCIS with calcifications, high-grade, 1.3 cm, focally less than 0.1 cm to posterior margin and focally less than 0.1 cm lateral margin, ER 95%, PR 80%, TisN0 stage 0      02/05/2016 - 03/03/2016 Radiation Therapy    Adj XRT      04/30/2016 -  Anti-estrogen oral therapy    Tamoxifen 20 mg daily       CHIEF COMPLIANT: Follow-up after adjuvant radiation  INTERVAL HISTORY: Lisa Greene is a 75 year old with above-mentioned history of left breast cancer treated with lumpectomy and adjuvant radiation. She is here today to discuss starting adjuvant antiestrogen therapy. She appears to have recovered very well from the effect of radiation.  REVIEW OF SYSTEMS:   Constitutional: Denies fevers, chills or abnormal weight loss Eyes: Denies blurriness of vision Ears, nose, mouth, throat, and face: Denies mucositis or sore throat Respiratory: Denies cough, dyspnea or wheezes Cardiovascular: Denies palpitation, chest discomfort Gastrointestinal:  Denies nausea, heartburn or change in bowel habits Skin: Denies abnormal skin rashes Lymphatics: Denies new  lymphadenopathy or easy bruising Neurological:Denies numbness, tingling or new weaknesses Behavioral/Psych: Mood is stable, no new changes  Extremities: No lower extremity edema Breast:  denies any pain or lumps or nodules in either breasts All other systems were reviewed with the patient and are negative.  I have reviewed the past medical history, past surgical history, social history and family history with the patient and they are unchanged from previous note.  ALLERGIES:  is allergic to lisinopril; penicillins; pork-derived products; and sulfonamide derivatives.  MEDICATIONS:  Current Outpatient Prescriptions  Medication Sig Dispense Refill  . Albuterol Sulfate (PROAIR RESPICLICK) 123XX123 (90 BASE) MCG/ACT AEPB Inhale 1 puff into the lungs every 8 (eight) hours as needed. (Patient not taking: Reported on 04/21/2016) 1 each 0  . cholecalciferol (VITAMIN D) 1000 UNITS tablet Take 2,000 Units by mouth daily.    . fluticasone (FLONASE) 50 MCG/ACT nasal spray Place 1 spray into both nostrils daily as needed.     . fluticasone furoate-vilanterol (BREO ELLIPTA) 100-25 MCG/INH AEPB Inhale 1 puff into the lungs daily.    . hydrochlorothiazide (HYDRODIURIL) 25 MG tablet TAKE 1 TABLET (25 MG TOTAL) BY MOUTH DAILY. 90 tablet 3  . losartan (COZAAR) 100 MG tablet TAKE 1 TABLET (100 MG TOTAL) BY MOUTH DAILY. 30 tablet 3  . metoprolol succinate (TOPROL-XL) 25 MG 24 hr tablet TAKE 1 TABLET (25 MG TOTAL) BY MOUTH DAILY. 90 tablet 2  . montelukast (SINGULAIR) 10 MG tablet Take 1 tablet by mouth at bedtime.    . niacin 500 MG tablet Take 500 mg by mouth at bedtime.    . Omega-3 Fatty Acids (FISH OIL) 1000 MG CAPS Take 1,000 mg by mouth  Nightly.    . tamoxifen (NOLVADEX) 20 MG tablet Take 1 tablet (20 mg total) by mouth daily. 90 tablet 3   No current facility-administered medications for this visit.     PHYSICAL EXAMINATION: ECOG PERFORMANCE STATUS: 1 - Symptomatic but completely ambulatory  Vitals:    04/30/16 1128  BP: (!) 115/52  Pulse: 84  Resp: 18  Temp: 97.9 F (36.6 C)   Filed Weights   04/30/16 1128  Weight: 154 lb 9.6 oz (70.1 kg)    GENERAL:alert, no distress and comfortable SKIN: skin color, texture, turgor are normal, no rashes or significant lesions EYES: normal, Conjunctiva are pink and non-injected, sclera clear OROPHARYNX:no exudate, no erythema and lips, buccal mucosa, and tongue normal  NECK: supple, thyroid normal size, non-tender, without nodularity LYMPH:  no palpable lymphadenopathy in the cervical, axillary or inguinal LUNGS: clear to auscultation and percussion with normal breathing effort HEART: regular rate & rhythm and no murmurs and no lower extremity edema ABDOMEN:abdomen soft, non-tender and normal bowel sounds MUSCULOSKELETAL:no cyanosis of digits and no clubbing  NEURO: alert & oriented x 3 with fluent speech, no focal motor/sensory deficits EXTREMITIES: No lower extremity edema   LABORATORY DATA:  I have reviewed the data as listed   Chemistry      Component Value Date/Time   NA 136 12/31/2015 1000   NA 137 11/06/2015 1219   K 4.1 12/31/2015 1000   K 4.3 11/06/2015 1219   CL 103 12/31/2015 1000   CO2 26 12/31/2015 1000   CO2 26 11/06/2015 1219   BUN 15 12/31/2015 1000   BUN 12.8 11/06/2015 1219   CREATININE 1.18 (H) 12/31/2015 1000   CREATININE 1.1 11/06/2015 1219      Component Value Date/Time   CALCIUM 9.4 12/31/2015 1000   CALCIUM 9.9 11/06/2015 1219   ALKPHOS 63 11/06/2015 1219   AST 15 11/06/2015 1219   ALT 13 11/06/2015 1219   BILITOT 0.57 11/06/2015 1219       Lab Results  Component Value Date   WBC 8.2 11/06/2015   HGB 13.7 11/06/2015   HCT 38.9 11/06/2015   MCV 88.0 11/06/2015   PLT 308 11/06/2015   NEUTROABS 6.0 11/06/2015    ASSESSMENT & PLAN:  Breast cancer of lower-inner quadrant of left female breast ([place]) Left lumpectomy 11/28/2015: DCIS with calcifications, high-grade, 1.3 cm, focally less than 0.1 cm  to posterior margin and focally less than 0.1 cm lateral margin, ER 95%, PR 80%, TisN0 stage 0  Adj XRT 02/05/16- 03/03/16 Current Treatment: Adj Tamoxifen 20 mg daily X 5 years  We discussed the risks and benefits of tamoxifen. These include but not limited to insomnia, hot flashes, mood changes, vaginal dryness, and weight gain. Although rare, serious side effects including endometrial cancer, risk of blood clots were also discussed. We strongly believe that the benefits far outweigh the risks. Patient understands these risks and consented to starting treatment. Planned treatment duration is 5 years.  Patient's risk of DCIS recurrence is extremely low. She would like to try the tamoxifen. If she cannot tolerate it then she will discontinue the treatment. I instructed the patient to call us if she develops any symptoms or concerns.  RTC in 3 months for tox check  No orders of the defined types were placed in this encounter.  The patient has a good understanding of the overall plan. she agrees with it. she will call with any problems that may develop before the next visit here.   Lindi Adie,  Tyler Deis, MD 04/30/16

## 2016-05-04 DIAGNOSIS — H1045 Other chronic allergic conjunctivitis: Secondary | ICD-10-CM | POA: Diagnosis not present

## 2016-05-04 DIAGNOSIS — J300 Vasomotor rhinitis: Secondary | ICD-10-CM | POA: Diagnosis not present

## 2016-05-04 DIAGNOSIS — J4540 Moderate persistent asthma, uncomplicated: Secondary | ICD-10-CM | POA: Diagnosis not present

## 2016-05-11 ENCOUNTER — Encounter: Payer: Self-pay | Admitting: Family Medicine

## 2016-05-11 ENCOUNTER — Ambulatory Visit (INDEPENDENT_AMBULATORY_CARE_PROVIDER_SITE_OTHER): Payer: Medicare HMO | Admitting: Family Medicine

## 2016-05-11 VITALS — BP 122/74 | HR 70 | Temp 99.0°F | Wt 153.6 lb

## 2016-05-11 DIAGNOSIS — K5732 Diverticulitis of large intestine without perforation or abscess without bleeding: Secondary | ICD-10-CM

## 2016-05-11 DIAGNOSIS — R1030 Lower abdominal pain, unspecified: Secondary | ICD-10-CM | POA: Diagnosis not present

## 2016-05-11 LAB — CBC WITH DIFFERENTIAL/PLATELET
BASOS PCT: 0.5 % (ref 0.0–3.0)
Basophils Absolute: 0 10*3/uL (ref 0.0–0.1)
EOS ABS: 0 10*3/uL (ref 0.0–0.7)
Eosinophils Relative: 0.6 % (ref 0.0–5.0)
HCT: 36 % (ref 36.0–46.0)
HEMOGLOBIN: 12.4 g/dL (ref 12.0–15.0)
LYMPHS ABS: 0.8 10*3/uL (ref 0.7–4.0)
Lymphocytes Relative: 13.1 % (ref 12.0–46.0)
MCHC: 34.5 g/dL (ref 30.0–36.0)
MCV: 90 fl (ref 78.0–100.0)
MONO ABS: 0.4 10*3/uL (ref 0.1–1.0)
Monocytes Relative: 7 % (ref 3.0–12.0)
NEUTROS PCT: 78.8 % — AB (ref 43.0–77.0)
Neutro Abs: 5 10*3/uL (ref 1.4–7.7)
Platelets: 350 10*3/uL (ref 150.0–400.0)
RBC: 4 Mil/uL (ref 3.87–5.11)
RDW: 12.9 % (ref 11.5–15.5)
WBC: 6.4 10*3/uL (ref 4.0–10.5)

## 2016-05-11 LAB — POC URINALSYSI DIPSTICK (AUTOMATED)
Bilirubin, UA: NEGATIVE
Glucose, UA: NEGATIVE
Ketones, UA: NEGATIVE
Leukocytes, UA: NEGATIVE
Nitrite, UA: NEGATIVE
PROTEIN UA: NEGATIVE
RBC UA: NEGATIVE
Spec Grav, UA: <=1.005
UROBILINOGEN UA: 0.2
pH, UA: 6

## 2016-05-11 MED ORDER — CIPROFLOXACIN HCL 500 MG PO TABS: 500.0000 mg | ORAL_TABLET | Freq: Two times a day (BID) | ORAL | 0 refills | Status: DC

## 2016-05-11 MED ORDER — METRONIDAZOLE 500 MG PO TABS: 500.0000 mg | ORAL_TABLET | Freq: Three times a day (TID) | ORAL | 0 refills | Status: DC

## 2016-05-11 NOTE — Progress Notes (Signed)
Pre visit review using our clinic review tool, if applicable. No additional management support is needed unless otherwise documented below in the visit note. 

## 2016-05-11 NOTE — Patient Instructions (Signed)
Please take medication as directed and follow up with Dr. Yong Channel in 2 to 3 days for further evaluation and follow up. Also, please go to the lab for blood work and we will notify you with results in one to two days or sooner if needed. If you develop new symptoms or increased pain, please seek medical attention immediately.   Diverticulitis Diverticulitis is when small pockets that have formed in your colon (large intestine) become infected or swollen. Follow these instructions at home:  Follow your doctor's instructions.  Follow a special diet if told by your doctor.  When you feel better, your doctor may tell you to change your diet. You may be told to eat a lot of fiber. Fruits and vegetables are good sources of fiber. Fiber makes it easier to poop (have bowel movements).  Take supplements or probiotics as told by your doctor.  Only take medicines as told by your doctor.  Keep all follow-up visits with your doctor. Contact a doctor if:  Your pain does not get better.  You have a hard time eating food.  You are not pooping like normal. Get help right away if:  Your pain gets worse.  Your problems do not get better.  Your problems suddenly get worse.  You have a fever.  You keep throwing up (vomiting).  You have bloody or black, tarry poop (stool). This information is not intended to replace advice given to you by your health care provider. Make sure you discuss any questions you have with your health care provider. Document Released: 11/04/2007 Document Revised: 10/24/2015 Document Reviewed: 04/12/2013 Elsevier Interactive Patient Education  2017 [employer].

## 2016-05-11 NOTE — Progress Notes (Signed)
Subjective:    Patient ID: Lisa Greene, female    DOB: 1940-10-21, 75 y.o.   MRN: [id number]  HPI  [name] is a 75 year old female who presents today with abdominal pressure that has been present for 3 days.  Associated nausea without vomiting has been present with low back pain that started at the same time..  She describes the abdominal pressure/pain is rated 3 to 4 and described as aching. No alleviating factors noted; possible aggravating factor of increased nut intake with holiday baking reported. No treatment at home has been initiated. She denies fever, chills, sweats, V/D, constipation, blood in stool, urinary frequency/urgency, hematuria, flank pain, chest pain, palpitations, SOB, reflux, or dizziness. History of diverticulitis that occurred 8 to 10 years ago which was treated on an outpatient basis successfully.   Review of Systems  Constitutional: Negative for chills and fever.  Respiratory: Negative for cough, shortness of breath and wheezing.   Cardiovascular: Negative for chest pain and palpitations.  Gastrointestinal: Positive for abdominal pain and nausea. Negative for blood in stool, constipation, diarrhea and vomiting.  Genitourinary: Negative for dysuria, frequency, hematuria, urgency and vaginal discharge.  Musculoskeletal: Positive for back pain. Negative for myalgias.  Skin: Negative for rash.  Neurological: Negative for dizziness, light-headedness and headaches.   Past Medical History:  Diagnosis Date  . ALLERGIC RHINITIS 01/31/2008  . Allergy   . Asthma   . Breast cancer ([place])   . Breast cancer of lower-inner quadrant of left female breast ([place]) 11/01/2015  . Cataract   . Colon cancer screening 05/21/2014   No polyps at age 64. Diverticulosis alone-no more colonoscopies.    Marland Kitchen DIVERTICULITIS, HX OF 09/07/2008   pt denies this   . Eosinophilia 02/08/2009  . Heart murmur    slight per pt.   Marland Kitchen HYPERLIPIDEMIA 12/20/2006  . Hypertension   . Osteopenia   .  OSTEOPOROSIS 12/20/2006     Social History   Social History  . Marital status: Married    Spouse name: N/A  . Number of children: N/A  . Years of education: N/A   Occupational History  . Not on file.   Social History Main Topics  . Smoking status: Never Smoker  . Smokeless tobacco: Never Used  . Alcohol use No  . Drug use: No  . Sexual activity: Not on file   Other Topics Concern  . Not on file   Social History Narrative   Married. No kids- 2 step kids. 5 step grandkids.       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 BREAST LUMPECTOMY WITH NEEDLE LOCALIZATION Left 01/03/2016   Procedure: BREAST re-excision LUMPECTOMY WITH NEEDLE LOCALIZATION;  Surgeon: Rolm Bookbinder, MD;  Location: [facility];  Service: General;  Laterality: Left;  BREAST re-excision LUMPECTOMY WITH NEEDLE LOCALIZATION  . BREAST LUMPECTOMY WITH RADIOACTIVE SEED LOCALIZATION Left 11/28/2015   Procedure: LEFT BREAST LUMPECTOMY WITH BRACKETED RADIOACTIVE SEED LOCALIZATION;  Surgeon: Rolm Bookbinder, MD;  Location: [facility];  Service: General;  Laterality: Left;  . BREAST SURGERY     cysts removed x2  . CATARACT EXTRACTION BILATERAL W/ ANTERIOR VITRECTOMY  2013   bilateral cataracts  . COLONOSCOPY  2005   tics only   . KNEE ARTHROSCOPY     left  . NEUROMA SURGERY  x2 feet  . thyroid duct cyst      Family History  Problem Relation Age of Onset  . COPD Father   . Heart disease Father     stent placed 53  . Brain cancer Mother   . Colon cancer Neg Hx   . Stomach cancer Neg Hx     Allergies  Allergen Reactions  . Lisinopril     Dry cough and stinging rash  . Penicillins     REACTION: rash  . Pork-Derived Products Other (See Comments)    Headache   . Sulfonamide Derivatives     REACTION: rash    Current Outpatient Prescriptions  on File Prior to Visit  Medication Sig Dispense Refill  . Albuterol Sulfate (PROAIR RESPICLICK) 123XX123 (90 BASE) MCG/ACT AEPB Inhale 1 puff into the lungs every 8 (eight) hours as needed. (Patient not taking: Reported on 04/21/2016) 1 each 0  . cholecalciferol (VITAMIN D) 1000 UNITS tablet Take 2,000 Units by mouth daily.    . fluticasone (FLONASE) 50 MCG/ACT nasal spray Place 1 spray into both nostrils daily as needed.     . fluticasone furoate-vilanterol (BREO ELLIPTA) 100-25 MCG/INH AEPB Inhale 1 puff into the lungs daily.    . hydrochlorothiazide (HYDRODIURIL) 25 MG tablet TAKE 1 TABLET (25 MG TOTAL) BY MOUTH DAILY. 90 tablet 3  . losartan (COZAAR) 100 MG tablet TAKE 1 TABLET (100 MG TOTAL) BY MOUTH DAILY. 30 tablet 3  . metoprolol succinate (TOPROL-XL) 25 MG 24 hr tablet TAKE 1 TABLET (25 MG TOTAL) BY MOUTH DAILY. 90 tablet 2  . montelukast (SINGULAIR) 10 MG tablet Take 1 tablet by mouth at bedtime.    . niacin 500 MG tablet Take 500 mg by mouth at bedtime.    . Omega-3 Fatty Acids (FISH OIL) 1000 MG CAPS Take 1,000 mg by mouth Nightly.    . tamoxifen (NOLVADEX) 20 MG tablet Take 1 tablet (20 mg total) by mouth daily. 90 tablet 3  . [DISCONTINUED] albuterol (PROVENTIL) 90 MCG/ACT inhaler Inhale 2 puffs into the lungs every 6 (six) hours as needed for wheezing. 17 g 0  . [DISCONTINUED] pantoprazole (PROTONIX) 40 MG tablet Take 1 tablet by mouth daily.     No current facility-administered medications on file prior to visit.     BP 122/74 (BP Location: Left Arm, Patient Position: Sitting, Cuff Size: Normal)   Pulse 70   Temp 99 F (37.2 C) (Oral)   Wt 153 lb 9.6 oz (69.7 kg)   SpO2 97%   BMI 28.09 kg/m        Objective:   Physical Exam  Constitutional: She is oriented to person, place, and time. She appears well-developed and well-nourished.  Eyes: Pupils are equal, round, and reactive to light. No scleral icterus.  Neck: Neck supple.  Cardiovascular: Normal rate and regular  rhythm.   Pulmonary/Chest: Effort normal and breath sounds normal. She has no wheezes. She has no rales.  Abdominal: Soft. Bowel sounds are normal. She exhibits no distension. There is tenderness. There is no rebound, no guarding and no CVA tenderness.  Abdominal tenderness noted to palpation of left lower quadrant. Mild tenderness present across lower abdomen but patient reports ache and discomfort mostly noted in left lower quadrant. Negative obturator, heel jar test, and psoas.   Musculoskeletal:   No tenderness to vertebral process or paraspinous muscles with palpation.  ROM is full at lumbar sacral regions. Negative Straight Leg raise. No CVA tenderness present.   Lymphadenopathy:  She has no cervical adenopathy.  Neurological: She is alert and oriented to person, place, and time. Coordination normal.  Skin: Skin is warm and dry. No rash noted.        Assessment & Plan:  1. Diverticulitis of colon History of diverticulosis; recent increase with nuts in her diet; low suspicion for appendicitis; suspect that symptoms are related to diverticulitis; will treat for diverticulitis with close follow up and monitoring of symptoms. Provided verbal and written instructions for return precautions. Follow up with Dr. Yong Channel in 3 days or sooner if needed.  - CBC with Differential/Platelet - ciprofloxacin (CIPRO) 500 MG tablet; Take 1 tablet (500 mg total) by mouth 2 (two) times daily.  Dispense: 14 tablet; Refill: 0 - metroNIDAZOLE (FLAGYL) 500 MG tablet; Take 1 tablet (500 mg total) by mouth 3 (three) times daily.  Dispense: 21 tablet; Refill: 0  2. Lower abdominal pain UA negative for leukocytes, nitrites. No urinary symptoms present today. Low suspicion of UTI. - POCT Urinalysis Dipstick (Automated)  Delano Metz, FNP-C

## 2016-05-14 ENCOUNTER — Ambulatory Visit (INDEPENDENT_AMBULATORY_CARE_PROVIDER_SITE_OTHER): Payer: Medicare HMO | Admitting: Adult Health

## 2016-05-14 ENCOUNTER — Encounter: Payer: Self-pay | Admitting: Adult Health

## 2016-05-14 VITALS — BP 128/74 | Temp 98.4°F | Ht 62.0 in | Wt 150.6 lb

## 2016-05-14 DIAGNOSIS — K5732 Diverticulitis of large intestine without perforation or abscess without bleeding: Secondary | ICD-10-CM | POA: Diagnosis not present

## 2016-05-14 NOTE — Progress Notes (Signed)
Subjective:    Patient ID: Lisa Greene, female    DOB: 1941/03/26, 75 y.o.   MRN: [id number]  HPI  75 year old female who presents to the office today for follow up after starting Cipro and Flagyl for suspected diverticulitis. She reports that she is feeling better but continues to have sharp pain in her left lower quadrant from time to time. She does endorse that the antibiotics are causing her to be nauseated.   She denies any fevers or feeling acutely ill.   Review of Systems  Constitutional: Negative.   HENT: Negative.   Respiratory: Negative.   Cardiovascular: Negative.   Gastrointestinal: Positive for abdominal pain, diarrhea and nausea. Negative for abdominal distention, anal bleeding, rectal pain and vomiting.  Musculoskeletal: Negative.   All other systems reviewed and are negative.  Past Medical History:  Diagnosis Date  . ALLERGIC RHINITIS 01/31/2008  . Allergy   . Asthma   . Breast cancer ([place])   . Breast cancer of lower-inner quadrant of left female breast ([place]) 11/01/2015  . Cataract   . Colon cancer screening 05/21/2014   No polyps at age 54. Diverticulosis alone-no more colonoscopies.    Marland Kitchen DIVERTICULITIS, HX OF 09/07/2008   pt denies this   . Eosinophilia 02/08/2009  . Heart murmur    slight per pt.   Marland Kitchen HYPERLIPIDEMIA 12/20/2006  . Hypertension   . Osteopenia   . OSTEOPOROSIS 12/20/2006    Social History   Social History  . Marital status: Married    Spouse name: N/A  . Number of children: N/A  . Years of education: N/A   Occupational History  . Not on file.   Social History Main Topics  . Smoking status: Never Smoker  . Smokeless tobacco: Never Used  . Alcohol use No  . Drug use: No  . Sexual activity: Not on file   Other Topics Concern  . Not on file   Social History Narrative   Married. No kids- 2 step kids. 5 step grandkids.       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 BREAST LUMPECTOMY WITH NEEDLE LOCALIZATION Left 01/03/2016   Procedure: BREAST re-excision LUMPECTOMY WITH NEEDLE LOCALIZATION;  Surgeon: Rolm Bookbinder, MD;  Location: [facility];  Service: General;  Laterality: Left;  BREAST re-excision LUMPECTOMY WITH NEEDLE LOCALIZATION  . BREAST LUMPECTOMY WITH RADIOACTIVE SEED LOCALIZATION Left 11/28/2015   Procedure: LEFT BREAST LUMPECTOMY WITH BRACKETED RADIOACTIVE SEED LOCALIZATION;  Surgeon: Rolm Bookbinder, MD;  Location: [facility];  Service: General;  Laterality: Left;  . BREAST SURGERY     cysts removed x2  . CATARACT EXTRACTION BILATERAL W/ ANTERIOR VITRECTOMY  2013   bilateral cataracts  . COLONOSCOPY  2005   tics only   . KNEE ARTHROSCOPY     left  . NEUROMA SURGERY     x2 feet  . thyroid duct cyst      Family History  Problem Relation Age of Onset  . COPD Father   . Heart disease Father     stent placed 75  . Brain cancer Mother   . Colon cancer Neg Hx   . Stomach cancer Neg Hx     Allergies  Allergen Reactions  . Lisinopril     Dry cough and stinging rash  . Penicillins  REACTION: rash  . Pork-Derived Products Other (See Comments)    Headache   . Sulfonamide Derivatives     REACTION: rash    Current Outpatient Prescriptions on File Prior to Visit  Medication Sig Dispense Refill  . Albuterol Sulfate (PROAIR RESPICLICK) 123XX123 (90 BASE) MCG/ACT AEPB Inhale 1 puff into the lungs every 8 (eight) hours as needed. 1 each 0  . cholecalciferol (VITAMIN D) 1000 UNITS tablet Take 2,000 Units by mouth daily.    . ciprofloxacin (CIPRO) 500 MG tablet Take 1 tablet (500 mg total) by mouth 2 (two) times daily. 14 tablet 0  . fluticasone (FLONASE) 50 MCG/ACT nasal spray Place 1 spray into both nostrils daily as needed.     . fluticasone furoate-vilanterol (BREO ELLIPTA) 100-25 MCG/INH AEPB Inhale 1 puff into the lungs daily.    .  hydrochlorothiazide (HYDRODIURIL) 25 MG tablet TAKE 1 TABLET (25 MG TOTAL) BY MOUTH DAILY. 90 tablet 3  . losartan (COZAAR) 100 MG tablet TAKE 1 TABLET (100 MG TOTAL) BY MOUTH DAILY. 30 tablet 3  . metoprolol succinate (TOPROL-XL) 25 MG 24 hr tablet TAKE 1 TABLET (25 MG TOTAL) BY MOUTH DAILY. 90 tablet 2  . metroNIDAZOLE (FLAGYL) 500 MG tablet Take 1 tablet (500 mg total) by mouth 3 (three) times daily. 21 tablet 0  . montelukast (SINGULAIR) 10 MG tablet Take 1 tablet by mouth at bedtime.    . niacin 500 MG tablet Take 500 mg by mouth at bedtime.    . Omega-3 Fatty Acids (FISH OIL) 1000 MG CAPS Take 1,000 mg by mouth Nightly.    . tamoxifen (NOLVADEX) 20 MG tablet Take 1 tablet (20 mg total) by mouth daily. 90 tablet 3  . [DISCONTINUED] albuterol (PROVENTIL) 90 MCG/ACT inhaler Inhale 2 puffs into the lungs every 6 (six) hours as needed for wheezing. 17 g 0  . [DISCONTINUED] pantoprazole (PROTONIX) 40 MG tablet Take 1 tablet by mouth daily.     No current facility-administered medications on file prior to visit.     BP 128/74   Temp 98.4 F (36.9 C) (Oral)   Ht 5\' 2"  (1.575 m)   Wt 150 lb 9.6 oz (68.3 kg)   BMI 27.55 kg/m       Objective:   Physical Exam  Constitutional: She is oriented to person, place, and time. She appears well-developed and well-nourished. No distress.  Cardiovascular: Normal rate, regular rhythm, normal heart sounds and intact distal pulses.  Exam reveals no gallop and no friction rub.   No murmur heard. Pulmonary/Chest: Effort normal and breath sounds normal. No respiratory distress. She has no wheezes. She has no rales. She exhibits no tenderness.  Abdominal: Soft. Bowel sounds are normal. She exhibits distension. She exhibits no mass. There is tenderness in the right lower quadrant, periumbilical area, suprapubic area and left lower quadrant. There is no rebound and no guarding.  Neurological: She is alert and oriented to person, place, and time.  Skin: Skin is  warm and dry. No rash noted. She is not diaphoretic. No erythema. No pallor.  Psychiatric: She has a normal mood and affect. Her behavior is normal. Judgment and thought content normal.  Nursing note and vitals reviewed.      Assessment & Plan:  1. Diverticulitis of colon - Continue with current therapy.  - Advised to start pro biotic.  - Follow up with PCP if not completely resolved after abx therapy   Dorothyann Peng, NP

## 2016-05-16 ENCOUNTER — Encounter (HOSPITAL_COMMUNITY): Payer: Self-pay | Admitting: *Deleted

## 2016-05-16 ENCOUNTER — Emergency Department (HOSPITAL_COMMUNITY)
Admission: EM | Admit: 2016-05-16 | Discharge: 2016-05-17 | Disposition: A | Discharge status: Home / Self Care | Payer: Medicare HMO | Attending: Emergency Medicine | Admitting: Emergency Medicine

## 2016-05-16 ENCOUNTER — Emergency Department (HOSPITAL_COMMUNITY): Payer: Medicare HMO

## 2016-05-16 DIAGNOSIS — R1032 Left lower quadrant pain: Secondary | ICD-10-CM | POA: Insufficient documentation

## 2016-05-16 DIAGNOSIS — J45909 Unspecified asthma, uncomplicated: Secondary | ICD-10-CM | POA: Diagnosis not present

## 2016-05-16 DIAGNOSIS — I129 Hypertensive chronic kidney disease with stage 1 through stage 4 chronic kidney disease, or unspecified chronic kidney disease: Secondary | ICD-10-CM | POA: Insufficient documentation

## 2016-05-16 DIAGNOSIS — N183 Chronic kidney disease, stage 3 (moderate): Secondary | ICD-10-CM | POA: Diagnosis not present

## 2016-05-16 DIAGNOSIS — Z853 Personal history of malignant neoplasm of breast: Secondary | ICD-10-CM | POA: Diagnosis not present

## 2016-05-16 LAB — URINALYSIS, ROUTINE W REFLEX MICROSCOPIC
Bilirubin Urine: NEGATIVE
GLUCOSE, UA: NEGATIVE mg/dL
Hgb urine dipstick: NEGATIVE
Ketones, ur: NEGATIVE mg/dL
LEUKOCYTES UA: NEGATIVE
Nitrite: NEGATIVE
PH: 5 (ref 5.0–8.0)
Protein, ur: NEGATIVE mg/dL
Specific Gravity, Urine: 1.012 (ref 1.005–1.030)

## 2016-05-16 LAB — CBC
HEMATOCRIT: 33.5 % — AB (ref 36.0–46.0)
Hemoglobin: 12.1 g/dL (ref 12.0–15.0)
MCH: 30.3 pg (ref 26.0–34.0)
MCHC: 36.1 g/dL — ABNORMAL HIGH (ref 30.0–36.0)
MCV: 84 fL (ref 78.0–100.0)
Platelets: 303 10*3/uL (ref 150–400)
RBC: 3.99 MIL/uL (ref 3.87–5.11)
RDW: 12.1 % (ref 11.5–15.5)
WBC: 7.1 10*3/uL (ref 4.0–10.5)

## 2016-05-16 LAB — COMPREHENSIVE METABOLIC PANEL
ALT: 15 U/L (ref 14–54)
AST: 23 U/L (ref 15–41)
Albumin: 3.6 g/dL (ref 3.5–5.0)
Alkaline Phosphatase: 41 U/L (ref 38–126)
Anion gap: 10 (ref 5–15)
BUN: 9 mg/dL (ref 6–20)
CHLORIDE: 93 mmol/L — AB (ref 101–111)
CO2: 21 mmol/L — ABNORMAL LOW (ref 22–32)
Calcium: 8.5 mg/dL — ABNORMAL LOW (ref 8.9–10.3)
Creatinine, Ser: 0.99 mg/dL (ref 0.44–1.00)
GFR, EST NON AFRICAN AMERICAN: 54 mL/min — AB (ref >60)
Glucose, Bld: 148 mg/dL — ABNORMAL HIGH (ref 65–99)
POTASSIUM: 3.1 mmol/L — AB (ref 3.5–5.1)
SODIUM: 124 mmol/L — AB (ref 135–145)
Total Bilirubin: 0.6 mg/dL (ref 0.3–1.2)
Total Protein: 5.9 g/dL — ABNORMAL LOW (ref 6.5–8.1)

## 2016-05-16 LAB — LIPASE, BLOOD: LIPASE: 23 U/L (ref 11–51)

## 2016-05-16 MED ORDER — SODIUM CHLORIDE 0.9 % IV BOLUS (SEPSIS)
1000.0000 mL | Freq: Once | INTRAVENOUS | Status: AC
  Administered 2016-05-16: 1000 mL via INTRAVENOUS

## 2016-05-16 MED ORDER — IOPAMIDOL (ISOVUE-300) INJECTION 61%
INTRAVENOUS | Status: AC
  Administered 2016-05-16: 100 mL
  Filled 2016-05-16: qty 100

## 2016-05-16 NOTE — ED Provider Notes (Signed)
[facility] DEPT Provider Note   CSN: [id number] Arrival date & time: 05/16/16  1841  History   Chief Complaint Chief Complaint  Patient presents with  . Abdominal Pain    HPI Lisa Greene is a 75 y.o. female.  HPI  75 y.o. female with a hx of Diverticulitis, presents to the Emergency Department today complaining of LLQ abdominal pain since Monday. Seen by PCP on 05-11-16 as well as 05-14-16 for same. Started on Cipro/Flagyl on 05-11-16 for suspected Diverticulitis. Pt notes improvement on Friday, but symptoms reoccurred this morning. Notes mild nausea. No emesis. No CP/SOB. No vaginal bleeding/discharge. No dysuria. Rates pain 4/10 currently. Finds relief with Tylenol on occasion. No diarrhea. No hematochezia. No other symptoms noted.     Past Medical History:  Diagnosis Date  . ALLERGIC RHINITIS 01/31/2008  . Allergy   . Asthma   . Breast cancer ([place])   . Breast cancer of lower-inner quadrant of left female breast ([place]) 11/01/2015  . Cataract   . Colon cancer screening 05/21/2014   No polyps at age 31. Diverticulosis alone-no more colonoscopies.    Marland Kitchen DIVERTICULITIS, HX OF 09/07/2008   pt denies this   . Eosinophilia 02/08/2009  . Heart murmur    slight per pt.   Marland Kitchen HYPERLIPIDEMIA 12/20/2006  . Hypertension   . Osteopenia   . OSTEOPOROSIS 12/20/2006    Patient Active Problem List   Diagnosis Date Noted  . Breast cancer of lower-inner quadrant of left female breast ([place]) 11/01/2015  . CKD (chronic kidney disease), stage III 10/22/2015  . Family history of abdominal aortic aneurysm 03/08/2015  . Asthma, moderate persistent, well-controlled 04/19/2014  . Hypertension 11/25/2010  . Eosinophilia 02/08/2009  . Allergic rhinitis 01/31/2008  . Hyperlipidemia 12/20/2006  . Osteopenia 12/20/2006    Past Surgical History:  Procedure Laterality Date  . BREAST LUMPECTOMY WITH NEEDLE LOCALIZATION Left 01/03/2016   Procedure: BREAST re-excision LUMPECTOMY WITH NEEDLE LOCALIZATION;   Surgeon: Rolm Bookbinder, MD;  Location: [facility];  Service: General;  Laterality: Left;  BREAST re-excision LUMPECTOMY WITH NEEDLE LOCALIZATION  . BREAST LUMPECTOMY WITH RADIOACTIVE SEED LOCALIZATION Left 11/28/2015   Procedure: LEFT BREAST LUMPECTOMY WITH BRACKETED RADIOACTIVE SEED LOCALIZATION;  Surgeon: Rolm Bookbinder, MD;  Location: [facility];  Service: General;  Laterality: Left;  . BREAST SURGERY     cysts removed x2  . CATARACT EXTRACTION BILATERAL W/ ANTERIOR VITRECTOMY  2013   bilateral cataracts  . COLONOSCOPY  2005   tics only   . KNEE ARTHROSCOPY     left  . NEUROMA SURGERY     x2 feet  . thyroid duct cyst      OB History    No data available       Home Medications    Prior to Admission medications   Medication Sig Start Date End Date Taking? Authorizing Provider  Albuterol Sulfate (PROAIR RESPICLICK) 123XX123 (90 BASE) MCG/ACT AEPB Inhale 1 puff into the lungs every 8 (eight) hours as needed. 04/19/14   Marin Olp, MD  cholecalciferol (VITAMIN D) 1000 UNITS tablet Take 2,000 Units by mouth daily.    Historical Provider, MD  ciprofloxacin (CIPRO) 500 MG tablet Take 1 tablet (500 mg total) by mouth 2 (two) times daily. 05/11/16   Delano Metz, FNP  fluticasone (FLONASE) 50 MCG/ACT nasal spray Place 1 spray into both nostrils daily as needed.  10/08/14   Historical Provider, MD  fluticasone furoate-vilanterol (BREO ELLIPTA) 100-25 MCG/INH AEPB Inhale 1 puff  into the lungs daily.    Historical Provider, MD  hydrochlorothiazide (HYDRODIURIL) 25 MG tablet TAKE 1 TABLET (25 MG TOTAL) BY MOUTH DAILY. 08/14/15   Marin Olp, MD  losartan (COZAAR) 100 MG tablet TAKE 1 TABLET (100 MG TOTAL) BY MOUTH DAILY. 02/27/16   Marin Olp, MD  metoprolol succinate (TOPROL-XL) 25 MG 24 hr tablet TAKE 1 TABLET (25 MG TOTAL) BY MOUTH DAILY. 03/30/16   Marin Olp, MD  metroNIDAZOLE (FLAGYL) 500 MG tablet Take 1 tablet (500 mg total) by  mouth 3 (three) times daily. 05/11/16   Delano Metz, FNP  montelukast (SINGULAIR) 10 MG tablet Take 1 tablet by mouth at bedtime. 08/30/15   Historical Provider, MD  niacin 500 MG tablet Take 500 mg by mouth at bedtime.    Historical Provider, MD  Omega-3 Fatty Acids (FISH OIL) 1000 MG CAPS Take 1,000 mg by mouth Nightly.    Historical Provider, MD  tamoxifen (NOLVADEX) 20 MG tablet Take 1 tablet (20 mg total) by mouth daily. 04/30/16   Nicholas Lose, MD    Family History Family History  Problem Relation Age of Onset  . COPD Father   . Heart disease Father     stent placed 72  . Brain cancer Mother   . Colon cancer Neg Hx   . Stomach cancer Neg Hx     Social History Social History  Substance Use Topics  . Smoking status: Never Smoker  . Smokeless tobacco: Never Used  . Alcohol use No     Allergies   Lisinopril; Penicillins; Pork-derived products; and Sulfonamide derivatives   Review of Systems Review of Systems ROS reviewed and all are negative for acute change except as noted in the HPI.  Physical Exam Updated Vital Signs BP (!) 144/53   Pulse 70   Temp 98.3 F (36.8 C) (Oral)   Resp 18   SpO2 97%   Physical Exam  Constitutional: She is oriented to person, place, and time. Vital signs are normal. She appears well-developed and well-nourished.  NAD. Well appearing  HENT:  Head: Normocephalic and atraumatic.  Right Ear: Hearing normal.  Left Ear: Hearing normal.  Eyes: Conjunctivae and EOM are normal. Pupils are equal, round, and reactive to light.  Neck: Normal range of motion. Neck supple.  Cardiovascular: Normal rate, regular rhythm, normal heart sounds and intact distal pulses.   Pulmonary/Chest: Effort normal and breath sounds normal.  Abdominal: Normal appearance and bowel sounds are normal. There is tenderness in the left lower quadrant. There is no rigidity, no rebound, no guarding, no CVA tenderness, no tenderness at McBurney's point and negative  Murphy's sign.  Musculoskeletal: Normal range of motion.  Neurological: She is alert and oriented to person, place, and time.  Skin: Skin is warm and dry.  Psychiatric: She has a normal mood and affect. Her speech is normal and behavior is normal. Thought content normal.  Nursing note and vitals reviewed.  ED Treatments / Results  Labs (all labs ordered are listed, but only abnormal results are displayed) Labs Reviewed  COMPREHENSIVE METABOLIC PANEL - Abnormal; Notable for the following:       Result Value   Sodium 124 (*)    Potassium 3.1 (*)    Chloride 93 (*)    CO2 21 (*)    Glucose, Bld 148 (*)    Calcium 8.5 (*)    Total Protein 5.9 (*)    GFR calc non Af Amer 54 (*)    All  other components within normal limits  CBC - Abnormal; Notable for the following:    HCT 33.5 (*)    MCHC 36.1 (*)    All other components within normal limits  URINALYSIS, ROUTINE W REFLEX MICROSCOPIC - Abnormal; Notable for the following:    APPearance HAZY (*)    All other components within normal limits  LIPASE, BLOOD    EKG  EKG Interpretation None       Radiology No results found.  Procedures Procedures (including critical care time)  Medications Ordered in ED Medications  sodium chloride 0.9 % bolus 1,000 mL (not administered)     Initial Impression / Assessment and Plan / ED Course  I have reviewed the triage vital signs and the nursing notes.  Pertinent labs & imaging results that were available during my care of the patient were reviewed by me and considered in my medical decision making (see chart for details).  Clinical Course    Final Clinical Impressions(s) / ED Diagnoses  {I have reviewed and evaluated the relevant laboratory values. {I have reviewed and evaluated the relevant imaging studies.  {I have reviewed the relevant previous healthcare records.  {I obtained HPI from historian. {Patient discussed with supervising physician.  ED Course:  Assessment: Pt is a  75yF with hx diverticulitis who presents with LLQ pain. Seen by PCP on Monday and Friday of this week for same. Started on Cipro/Flagyl with completion tomorrow. Noted improving symptoms yesterday, but worse today. No diarrhea. No hematochezia. No CP/SOB. On exam, pt in NAD. Nontoxic/nonseptic appearing. VSS. Afebrile. Lungs CTA. Heart RRR. Abdomen TTP LLQ. Soft. No induration. Labs showed mild hyponatremia and hypokalemia. No leukocytosis. CT ABD/Pelvis showed incidental right breast masses, which appears to be followed by PCP with biopsies done. Also noted is 60mm fluid distention in endometrium with thickened cervix. Radiologist recommended outpatient ultrasound with GYN/PCP. Given fluids in ED. Plan is to DC home with follow up to PCP. Complete dose of Cipro/Flagyl. At time of discharge, Patient is in no acute distress. Vital Signs are stable. Patient is able to ambulate. Patient able to tolerate PO.   Disposition/Plan:  DC Home Additional Verbal discharge instructions given and discussed with patient.  Pt Instructed to f/u with PCP in the next week for evaluation and treatment of symptoms. Return precautions given Pt acknowledges and agrees with plan  Supervising Physician Blanchie Dessert, MD  Final diagnoses:  Left lower quadrant pain    New Prescriptions New Prescriptions   No medications on file     Shary Decamp, PA-C 05/17/16 0009    Blanchie Dessert, MD 05/17/16 2036

## 2016-05-16 NOTE — ED Triage Notes (Signed)
The pt has had abd pain since Monday when she saw her doctor and she saw her doctor again on Thursday she has  Gotten worse since then sl low grade temo

## 2016-05-17 MED ORDER — ONDANSETRON HCL 4 MG PO TABS: 4.0000 mg | ORAL_TABLET | Freq: Four times a day (QID) | ORAL | 0 refills | Status: DC

## 2016-05-17 NOTE — ED Notes (Signed)
Pt ambulating independently w/ steady gait on d/c in no acute distress, A&Ox4. Rx given x1 D/c instructions reviewed w/ pt and family - pt and family deny any further questions or concerns at present.

## 2016-05-17 NOTE — Discharge Instructions (Signed)
Please read and follow all provided instructions.  Your diagnoses today include:  1. Left lower quadrant pain    Tests performed today include: Vital signs. See below for your results today.   Medications:  Finish Antibiotics as directed   Home care instructions:  Follow any educational materials contained in this packet.  Follow-up instructions: Please follow-up with your primary care provider for further evaluation of symptoms and treatment   Return instructions:  Please return to the Emergency Department if you do not get better, if you get worse, or new symptoms OR  - Fever (temperature greater than 101.52F)  - Bleeding that does not stop with holding pressure to the area    -Severe pain (please note that you may be more sore the day after your accident)  - Chest Pain  - Difficulty breathing  - Severe nausea or vomiting  - Inability to tolerate food and liquids  - Passing out  - Skin becoming red around your wounds  - Change in mental status (confusion or lethargy)  - New numbness or weakness    Please return if you have any other emergent concerns.  Additional Information:  Your vital signs today were: BP (!) 144/53    Pulse 70    Temp 98.3 F (36.8 C) (Oral)    Resp 18    SpO2 97%  If your blood pressure (BP) was elevated above 135/85 this visit, please have this repeated by your doctor within one month. ---------------

## 2016-05-18 ENCOUNTER — Telehealth: Payer: Self-pay | Admitting: Family Medicine

## 2016-05-18 ENCOUNTER — Ambulatory Visit (INDEPENDENT_AMBULATORY_CARE_PROVIDER_SITE_OTHER): Payer: Medicare HMO | Admitting: Family Medicine

## 2016-05-18 ENCOUNTER — Encounter: Payer: Self-pay | Admitting: Family Medicine

## 2016-05-18 VITALS — BP 118/68 | HR 66 | Temp 98.1°F | Wt 150.8 lb

## 2016-05-18 DIAGNOSIS — N852 Hypertrophy of uterus: Secondary | ICD-10-CM

## 2016-05-18 DIAGNOSIS — K5732 Diverticulitis of large intestine without perforation or abscess without bleeding: Secondary | ICD-10-CM | POA: Diagnosis not present

## 2016-05-18 DIAGNOSIS — N6310 Unspecified lump in the right breast, unspecified quadrant: Secondary | ICD-10-CM

## 2016-05-18 NOTE — Patient Instructions (Signed)
Continue with progressing diet slowly and please drink plenty of water; enough to keep your urine pale yellow or clear. Also, Dr. Yong Channel will place orders for mammogram and ultrasound as we discussed.   If you do not hear within 10 days regarding an appointment for these procedures, please contact office for scheduling information.

## 2016-05-18 NOTE — Progress Notes (Signed)
Subjective:    Patient ID: Lisa Greene, female    DOB: 14-Jun-1940, 75 y.o.   MRN: [id number]  HPI  Lisa Greene is a 75 year old female who presents today for follow up after a visit to the ED on 05/16/16 for left lower quadrant pain that was treated on 05/11/16 for suspected diverticulitis with Cipro/Flagyl. She reports that symptoms were improving with Cipro/Flagyl and then she had increased LLQ pain on 05/16/16 where she sought treatment in the ED.  She denies N/V/D, hematochezia, chest pain, and SOB.  She has completed Cipro/Flagyl yesterday and is able to tolerate po intake. She states that she she was not eating or drinking "very well" which led to her seeking care in the ED where she was treated for dehydration. Today, she reports feeling "better" and denies abdominal pain. She denies fever, chills, sweats, N/V/D, urinary frequency/urgency, hematuria, flank pain, chest pain, palpations, SOB, reflux or dizziness.  She is also seeking care due to recommendation by ED provider for follow up after results of CT. CT ABD/Pelvis completed in the ED noted a 9 mm fluid distention in endometrium with thickened cervix and incidental right breast masses that are suspicious for neoplasm; history of breast cancer. Review of CT by radiologist recommend mammogram and ultrasound for follow up.  Further recommendation of a pelvic US for thickened cervix was noted on CT   Review of Systems  Constitutional: Negative for chills, fatigue and fever.  Respiratory: Negative for cough, shortness of breath and wheezing.   Cardiovascular: Negative for chest pain and palpitations.  Gastrointestinal: Negative for abdominal pain, blood in stool, constipation, diarrhea, nausea and vomiting.  Genitourinary: Negative for dysuria, frequency, hematuria, pelvic pain and urgency.  Musculoskeletal: Negative for myalgias.  Neurological: Negative for dizziness and light-headedness.   Past Medical History:  Diagnosis Date  .  ALLERGIC RHINITIS 01/31/2008  . Allergy   . Asthma   . Breast cancer ([place])   . Breast cancer of lower-inner quadrant of left female breast ([place]) 11/01/2015  . Cataract   . Colon cancer screening 05/21/2014   No polyps at age 57. Diverticulosis alone-no more colonoscopies.    Marland Kitchen DIVERTICULITIS, HX OF 09/07/2008   pt denies this   . Eosinophilia 02/08/2009  . Heart murmur    slight per pt.   Marland Kitchen HYPERLIPIDEMIA 12/20/2006  . Hypertension   . Osteopenia   . OSTEOPOROSIS 12/20/2006     Social History   Social History  . Marital status: Married    Spouse name: N/A  . Number of children: N/A  . Years of education: N/A   Occupational History  . Not on file.   Social History Main Topics  . Smoking status: Never Smoker  . Smokeless tobacco: Never Used  . Alcohol use No  . Drug use: No  . Sexual activity: Not on file   Other Topics Concern  . Not on file   Social History Narrative   Married. No kids- 2 step kids. 5 step grandkids.       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 BREAST LUMPECTOMY WITH NEEDLE LOCALIZATION Left 01/03/2016   Procedure: BREAST re-excision LUMPECTOMY WITH NEEDLE LOCALIZATION;  Surgeon: Rolm Bookbinder, MD;  Location: [facility];  Service: General;  Laterality: Left;  BREAST re-excision LUMPECTOMY WITH NEEDLE LOCALIZATION  . BREAST LUMPECTOMY  WITH RADIOACTIVE SEED LOCALIZATION Left 11/28/2015   Procedure: LEFT BREAST LUMPECTOMY WITH BRACKETED RADIOACTIVE SEED LOCALIZATION;  Surgeon: Rolm Bookbinder, MD;  Location: [facility];  Service: General;  Laterality: Left;  . BREAST SURGERY     cysts removed x2  . CATARACT EXTRACTION BILATERAL W/ ANTERIOR VITRECTOMY  2013   bilateral cataracts  . COLONOSCOPY  2005   tics only   . KNEE ARTHROSCOPY     left  . NEUROMA SURGERY     x2 feet  . thyroid duct cyst        Family History  Problem Relation Age of Onset  . COPD Father   . Heart disease Father     stent placed 28  . Brain cancer Mother   . Colon cancer Neg Hx   . Stomach cancer Neg Hx     Allergies  Allergen Reactions  . Lisinopril Other (See Comments) and Cough    Dry cough and stinging rash  . Pork-Derived Products Other (See Comments)    Headache (can only tolerate about once a week, if that)  . Latex Rash  . Penicillins Rash    Has patient had a PCN reaction causing immediate rash, facial/tongue/throat swelling, SOB or lightheadedness with hypotension: Yes Has patient had a PCN reaction causing severe rash involving mucus membranes or skin necrosis: No Has patient had a PCN reaction that required hospitalization No Has patient had a PCN reaction occurring within the last 10 years: No If all of the above answers are "NO", then may proceed with Cephalosporin use.   . Sulfonamide Derivatives Rash  . Tape Rash    Band-aids break out skin!!    Current Outpatient Prescriptions on File Prior to Visit  Medication Sig Dispense Refill  . Albuterol Sulfate (PROAIR RESPICLICK) 123XX123 (90 BASE) MCG/ACT AEPB Inhale 1 puff into the lungs every 8 (eight) hours as needed. (Patient taking differently: Inhale 1 puff into the lungs every 8 (eight) hours as needed (for shortness of breath). ) 1 each 0  . cholecalciferol (VITAMIN D) 1000 UNITS tablet Take 2,000 Units by mouth daily.    . fluticasone (FLONASE) 50 MCG/ACT nasal spray Place 1 spray into both nostrils daily as needed.     . fluticasone furoate-vilanterol (BREO ELLIPTA) 100-25 MCG/INH AEPB Inhale 1 puff into the lungs daily.    . hydrochlorothiazide (HYDRODIURIL) 25 MG tablet TAKE 1 TABLET (25 MG TOTAL) BY MOUTH DAILY. 90 tablet 3  . losartan (COZAAR) 100 MG tablet TAKE 1 TABLET (100 MG TOTAL) BY MOUTH DAILY. 30 tablet 3  . metoprolol succinate (TOPROL-XL) 25 MG 24 hr tablet TAKE 1 TABLET (25 MG TOTAL) BY MOUTH DAILY. 90 tablet 2  .  montelukast (SINGULAIR) 10 MG tablet Take 1 tablet by mouth at bedtime.    . niacin 500 MG tablet Take 500 mg by mouth at bedtime.    . Omega-3 Fatty Acids (FISH OIL) 1000 MG CAPS Take 1,000 mg by mouth Nightly.    . ondansetron (ZOFRAN) 4 MG tablet Take 1 tablet (4 mg total) by mouth every 6 (six) hours. 12 tablet 0  . tamoxifen (NOLVADEX) 20 MG tablet Take 1 tablet (20 mg total) by mouth daily. 90 tablet 3  . [DISCONTINUED] albuterol (PROVENTIL) 90 MCG/ACT inhaler Inhale 2 puffs into the lungs every 6 (six) hours as needed for wheezing. 17 g 0  . [DISCONTINUED] pantoprazole (PROTONIX) 40 MG tablet Take 1 tablet by mouth daily.     No current facility-administered medications  on file prior to visit.     BP 118/68 (BP Location: Left Arm, Patient Position: Sitting, Cuff Size: Normal)   Pulse 66   Temp 98.1 F (36.7 C) (Oral)   Wt 150 lb 12.8 oz (68.4 kg)   SpO2 97%   BMI 27.58 kg/m       Objective:   Physical Exam  Constitutional: She is oriented to person, place, and time. She appears well-developed and well-nourished.  Eyes: Pupils are equal, round, and reactive to light. No scleral icterus.  Neck: Neck supple.  Cardiovascular: Normal rate and regular rhythm.   Pulmonary/Chest: Effort normal and breath sounds normal. She has no wheezes. She has no rales.  Abdominal: Soft. Bowel sounds are normal. She exhibits no distension. There is no tenderness. There is no rebound.  Lymphadenopathy:    She has no cervical adenopathy.  Neurological: She is alert and oriented to person, place, and time. Coordination normal.  Skin: Skin is warm and dry. No rash noted.  Psychiatric: She has a normal mood and affect. Her behavior is normal. Judgment and thought content normal.       Assessment & Plan:  1. Diverticulitis of colon Resolving symptoms of diverticulitis. VSS; patient reports improvement and exam is reassuring for resolution of symptoms. Advised focus on hydration and progression of  diet as tolerated. Further discussed symptoms of diverticulitis and return for further evaluation if symptoms return.   Spoke with Dr. Yong Channel regarding further recommendations based upon CT results.  Follow up imaging will be ordered for further evaluation of findings of CT and care by Dr. Yong Channel.  Delano Metz, FNP-C

## 2016-05-18 NOTE — Telephone Encounter (Signed)
Findings on recent CT abdomen/pelvis- brought to my attention by Almira Coaster, NP who saw patient today for follow up "At least 2 RIGHT breast masses measure up to 2.5 cm, highly suspicious for neoplasm in patient with history of breast cancer. Recommend mammogram and ultrasound.  9 mm fluid distended endometrium recommend pelvic ultrasound on nonemergent basis. Thickened cervix, recommend direct inspection."  1. Will order diagnostic mammogram and ultrasound right breast 2. US transvaginal ordered to evaluate the uterus and cervix issues  After ultrasound uterus done will need to see patient in office for follow up and likely pelvic ultrasound.   Roselyn Reef- please discuss with patient.   Dr. [place] Adie- just FYI (prior scans were with [facility] so I can change if needed)

## 2016-05-18 NOTE — Progress Notes (Signed)
Pre visit review using our clinic review tool, if applicable. No additional management support is needed unless otherwise documented below in the visit note. 

## 2016-05-19 NOTE — Telephone Encounter (Signed)
Dr.Hunter That's surprising given that her prior DCIS was in left breast. Please get those images at [facility] for continuity. Thanks [employer]

## 2016-05-19 NOTE — Telephone Encounter (Signed)
Neoma Laming- can you change over breast ultrasound and mammogram to [facility] please?  Roselyn Reef- can you touch base with Neoma Laming if no response by Thursday?

## 2016-05-20 NOTE — Telephone Encounter (Signed)
Spoke with patient and let her know procedures that we are going to order and why per Dr. Ansel Bong message. Patient verbalized understanding. Orders placed as instructed.

## 2016-06-02 NOTE — Telephone Encounter (Signed)
Looks like 07/29/16

## 2016-06-03 ENCOUNTER — Other Ambulatory Visit: Payer: Self-pay | Admitting: Emergency Medicine

## 2016-06-03 ENCOUNTER — Other Ambulatory Visit: Payer: Self-pay | Admitting: Family Medicine

## 2016-06-03 ENCOUNTER — Telehealth: Payer: Self-pay | Admitting: Emergency Medicine

## 2016-06-03 DIAGNOSIS — N852 Hypertrophy of uterus: Secondary | ICD-10-CM

## 2016-06-03 NOTE — Telephone Encounter (Signed)
Spoke with patient; patient states right MM and [place] ordered and scheduled for 06/09/16 at [facility]. Transvaginal US to be sceduled also per Dr Ansel Bong orders.   Patient states that she was experiencing nausea and queasiness when taking the Tamoxifen states she also experienced some back pain as well. Patient stopped taking the Tamoxifen one week ago. States symptoms have resolved; states feeling better now.   Per Dr Lindi Adie; we will hold the Tamoxifen until the right breast US and MM and transvaginal US are completed and all those issues are resolved. Patient verbalized understanding.

## 2016-06-04 ENCOUNTER — Ambulatory Visit
Admission: RE | Admit: 2016-06-04 | Discharge: 2016-06-04 | Disposition: A | Discharge status: Home / Self Care | Payer: Medicare HMO | Source: Ambulatory Visit | Attending: Family Medicine | Admitting: Family Medicine

## 2016-06-04 DIAGNOSIS — D259 Leiomyoma of uterus, unspecified: Secondary | ICD-10-CM | POA: Diagnosis not present

## 2016-06-04 DIAGNOSIS — N852 Hypertrophy of uterus: Secondary | ICD-10-CM

## 2016-06-06 ENCOUNTER — Other Ambulatory Visit: Payer: Self-pay | Admitting: Family Medicine

## 2016-06-08 ENCOUNTER — Encounter: Payer: Self-pay | Admitting: Family Medicine

## 2016-06-09 ENCOUNTER — Telehealth: Payer: Self-pay | Admitting: Obstetrics and Gynecology

## 2016-06-09 ENCOUNTER — Encounter: Payer: Self-pay | Admitting: Family Medicine

## 2016-06-09 ENCOUNTER — Other Ambulatory Visit: Payer: Self-pay

## 2016-06-09 DIAGNOSIS — R928 Other abnormal and inconclusive findings on diagnostic imaging of breast: Secondary | ICD-10-CM | POA: Diagnosis not present

## 2016-06-09 DIAGNOSIS — N852 Hypertrophy of uterus: Secondary | ICD-10-CM

## 2016-06-09 DIAGNOSIS — R1032 Left lower quadrant pain: Secondary | ICD-10-CM

## 2016-06-09 DIAGNOSIS — Z09 Encounter for follow-up examination after completed treatment for conditions other than malignant neoplasm: Secondary | ICD-10-CM | POA: Diagnosis not present

## 2016-06-09 LAB — HM MAMMOGRAPHY

## 2016-06-09 NOTE — Telephone Encounter (Signed)
Called and left a message for patient to call back to schedule a new patient doctor referral. °

## 2016-06-10 ENCOUNTER — Encounter: Payer: Self-pay | Admitting: Family Medicine

## 2016-06-10 ENCOUNTER — Encounter: Payer: Self-pay | Admitting: Obstetrics and Gynecology

## 2016-06-10 ENCOUNTER — Ambulatory Visit (INDEPENDENT_AMBULATORY_CARE_PROVIDER_SITE_OTHER): Payer: Medicare HMO | Admitting: Obstetrics and Gynecology

## 2016-06-10 ENCOUNTER — Encounter: Payer: Medicare HMO | Admitting: Adult Health

## 2016-06-10 VITALS — BP 132/60 | HR 76 | Resp 13 | Ht 62.0 in | Wt 152.0 lb

## 2016-06-10 DIAGNOSIS — N859 Noninflammatory disorder of uterus, unspecified: Secondary | ICD-10-CM | POA: Diagnosis not present

## 2016-06-10 DIAGNOSIS — R1032 Left lower quadrant pain: Secondary | ICD-10-CM | POA: Diagnosis not present

## 2016-06-10 DIAGNOSIS — D251 Intramural leiomyoma of uterus: Secondary | ICD-10-CM | POA: Diagnosis not present

## 2016-06-10 DIAGNOSIS — N882 Stricture and stenosis of cervix uteri: Secondary | ICD-10-CM

## 2016-06-10 NOTE — Progress Notes (Signed)
76 y.o. G0P0000 MarriedCaucasianF here for consultation from Dr Garret Reddish for LLQ pain and an enlarged uterus.   The patient was seen last month by her primary and the ER with c/o LLQ abdominal pain. She was treated for suspected diverticulitis. Her pain is mostly better, still with some pain in her LLQ and left lower back. Tolerable now. No vaginal bleeding. Sexually active without pain (not in the last few months). She had a CT as part of her evaluation that showed diverticulosis, 2 right breast masses, a thickened cervix and a 9 mm fluid distended endometrium. Ultrasound revealed a normal sized uterus with a 1.5 cm intramural fibroid, a 3 mm endometrium with fluid noted in the endometrial canal and normal ovaries. She has a h/o breast cancer, has been on Tamoxifen, stopped Tamoxifen last month with the pain. She will call her Oncologist to f/u.   She had f/u mammogram yesterday and was told she had 2 breast cysts. No further evaluation.      No LMP recorded. Patient is postmenopausal.          Sexually active: Yes.    The current method of family planning is post menopausal status.    Exercising: Yes.    walking Smoker:  no  Health Maintenance: Pap:  6/28/ 2007 WNL  History of abnormal Pap:  Yes years ago  MMG:  06-09-16- waiting for results  Colonoscopy:  05-11-14 WNL  BMD:   06-13-15 osteopenia  TDaP:  08-22-12 Gardasil: N/A   reports that she has never smoked. She has never used smokeless tobacco. She reports that she does not drink alcohol or use drugs.  Past Medical History:  Diagnosis Date  . ALLERGIC RHINITIS 01/31/2008  . Allergy   . Asthma   . Breast cancer ([place])   . Breast cancer of lower-inner quadrant of left female breast ([place]) 11/01/2015  . Cataract   . Colon cancer screening 05/21/2014   No polyps at age 30. Diverticulosis alone-no more colonoscopies.    Marland Kitchen DIVERTICULITIS, HX OF 09/07/2008   pt denies this   . Eosinophilia 02/08/2009  . Heart murmur    slight per  pt.   Marland Kitchen HYPERLIPIDEMIA 12/20/2006  . Hypertension   . Osteopenia   . OSTEOPOROSIS 12/20/2006    Past Surgical History:  Procedure Laterality Date  . BREAST LUMPECTOMY WITH NEEDLE LOCALIZATION Left 01/03/2016   Procedure: BREAST re-excision LUMPECTOMY WITH NEEDLE LOCALIZATION;  Surgeon: Rolm Bookbinder, MD;  Location: [place];  Service: General;  Laterality: Left;  BREAST re-excision LUMPECTOMY WITH NEEDLE LOCALIZATION  . BREAST LUMPECTOMY WITH RADIOACTIVE SEED LOCALIZATION Left 11/28/2015   Procedure: LEFT BREAST LUMPECTOMY WITH BRACKETED RADIOACTIVE SEED LOCALIZATION;  Surgeon: Rolm Bookbinder, MD;  Location: [place];  Service: General;  Laterality: Left;  . BREAST SURGERY     cysts removed x2  . CATARACT EXTRACTION BILATERAL W/ ANTERIOR VITRECTOMY  2013   bilateral cataracts  . COLONOSCOPY  2005   tics only   . KNEE ARTHROSCOPY     left  . NEUROMA SURGERY     x2 feet  . thyroid duct cyst      Current Outpatient Prescriptions  Medication Sig Dispense Refill  . Albuterol Sulfate (PROAIR RESPICLICK) 123XX123 (90 BASE) MCG/ACT AEPB Inhale 1 puff into the lungs every 8 (eight) hours as needed. (Patient taking differently: Inhale 1 puff into the lungs every 8 (eight) hours as needed (for shortness of breath). ) 1 each 0  . cholecalciferol (VITAMIN  D) 1000 UNITS tablet Take 2,000 Units by mouth daily.    . fluticasone (FLONASE) 50 MCG/ACT nasal spray Place 1 spray into both nostrils daily as needed.     . fluticasone furoate-vilanterol (BREO ELLIPTA) 100-25 MCG/INH AEPB Inhale 1 puff into the lungs daily.    . hydrochlorothiazide (HYDRODIURIL) 25 MG tablet TAKE 1 TABLET (25 MG TOTAL) BY MOUTH DAILY. 90 tablet 1  . losartan (COZAAR) 100 MG tablet TAKE 1 TABLET (100 MG TOTAL) BY MOUTH DAILY. 90 tablet 2  . metoprolol succinate (TOPROL-XL) 25 MG 24 hr tablet TAKE 1 TABLET (25 MG TOTAL) BY MOUTH DAILY. 90 tablet 2  . montelukast (SINGULAIR) 10 MG tablet Take 1  tablet by mouth at bedtime.    . niacin 500 MG tablet Take 500 mg by mouth at bedtime.    . Omega-3 Fatty Acids (FISH OIL) 1000 MG CAPS Take 1,000 mg by mouth Nightly.    . ondansetron (ZOFRAN) 4 MG tablet Take 1 tablet (4 mg total) by mouth every 6 (six) hours. 12 tablet 0  . tamoxifen (NOLVADEX) 20 MG tablet Take 1 tablet (20 mg total) by mouth daily. (Patient not taking: Reported on 06/10/2016) 90 tablet 3   No current facility-administered medications for this visit.     Family History  Problem Relation Age of Onset  . COPD Father   . Heart disease Father     stent placed 78  . Brain cancer Mother   . Colon cancer Neg Hx   . Stomach cancer Neg Hx     Review of Systems  Constitutional: Negative.   HENT: Negative.   Eyes: Negative.   Respiratory: Negative.   Cardiovascular: Negative.   Gastrointestinal: Negative.   Endocrine: Negative.   Genitourinary: Negative.        LLQ pain   Musculoskeletal: Positive for back pain.  Skin: Negative.   Allergic/Immunologic: Negative.   Neurological: Negative.   Psychiatric/Behavioral: Negative.     Exam:   BP 132/60 (BP Location: Right Arm, Patient Position: Sitting, Cuff Size: Normal)   Pulse 76   Resp 13   Ht 5\' 2"  (1.575 m)   Wt 152 lb (68.9 kg)   BMI 27.80 kg/m   Weight change: @WEIGHTCHANGE @ Height:   Height: 5\' 2"  (157.5 cm)  Ht Readings from Last 3 Encounters:  06/10/16 5\' 2"  (1.575 m)  05/14/16 5\' 2"  (1.575 m)  04/21/16 5\' 2"  (1.575 m)    General appearance: alert, cooperative and appears stated age Head: Normocephalic, without obvious abnormality, atraumatic Neck: no adenopathy, supple, symmetrical, trachea midline and thyroid normal to inspection and palpation Abdomen: soft, mildly tender in the region of the ascending and descending colon. Bowel sounds normal; no masses,  no organomegaly, mildly distended Lymph nodes: Cervical nodes normal. No abnormal inguinal nodes palpated Neurologic: Grossly  normal   Pelvic: External genitalia:  no lesions, atrophic              Urethra:  normal appearing urethra with no masses, tenderness or lesions              Bartholins and Skenes: normal                 Vagina: normal atrophic appearing vagina with normal color and discharge, no lesions   Grade 1 cystocele               Cervix: no lesions and stenotic  Bimanual Exam:  Uterus:  normal size, contour, position, consistency, mobility, non-tender and anteverted              Adnexa: no mass, fullness, tenderness               Rectovaginal: Confirms               Anus:  normal sphincter tone, no lesions  Chaperone was present for exam.  Reviewed CT report, reviewed ultrasound report and images. Reviewed the gyn ultrasound images with the patient.   A:  Abdominal pain, improving, s/p treatment for diverticulosis  Incidental finding of fluid in her uterus, endometrial thickness normal at 3 mm  The endometrial fluid goes along with her cervical stenosis  Incidental finding of a small intramural myoma, no further evaluation needed  P:   Routine f/u, no further evaluation needed at this point  Call with any bleeding  CC: Dr Garret Reddish Letter sent

## 2016-06-11 ENCOUNTER — Ambulatory Visit: Payer: Medicare HMO | Admitting: Obstetrics & Gynecology

## 2016-06-15 ENCOUNTER — Telehealth: Payer: Self-pay | Admitting: Family Medicine

## 2016-06-15 NOTE — Telephone Encounter (Signed)
° ° ° ° °  Pt  mychart is telling her that she need to have the following shot pneumococcal poly 23. Pt want to make sure she need to have this shot. She had the the shot in 12/2006 and she was 65 at the time she had it.

## 2016-06-16 NOTE — Telephone Encounter (Signed)
Called and left pt a voicemail message asking for a return phone call. She has had the injection in the past and it is documented on her immunization. I looked at Health Maintenance and it is updated there also. I am not sure why it is showing this on My Chart. She does not need the injection.

## 2016-06-22 ENCOUNTER — Ambulatory Visit (INDEPENDENT_AMBULATORY_CARE_PROVIDER_SITE_OTHER): Payer: Medicare HMO | Admitting: Obstetrics and Gynecology

## 2016-06-22 ENCOUNTER — Encounter: Payer: Self-pay | Admitting: Obstetrics and Gynecology

## 2016-06-22 VITALS — BP 126/68 | HR 70 | Ht 62.0 in | Wt 152.4 lb

## 2016-06-22 DIAGNOSIS — R351 Nocturia: Secondary | ICD-10-CM

## 2016-06-22 DIAGNOSIS — N898 Other specified noninflammatory disorders of vagina: Secondary | ICD-10-CM

## 2016-06-22 LAB — POCT URINALYSIS DIPSTICK
Bilirubin, UA: NEGATIVE
Glucose, UA: NEGATIVE
KETONES UA: NEGATIVE
Leukocytes, UA: NEGATIVE
Nitrite, UA: NEGATIVE
PROTEIN UA: NEGATIVE
RBC UA: NEGATIVE
Urobilinogen, UA: NEGATIVE
pH, UA: 5

## 2016-06-22 NOTE — Progress Notes (Signed)
GYNECOLOGY  VISIT   HPI: 76 y.o.   Married  Caucasian  female   G0P0000 with No LMP recorded. Patient is postmenopausal.   here for vaginal discharge with odor. Symptoms started about 2 weeks ago, small amount of clear d/c. Feels sticky and wet. Notices an odor. Occasional itch, no burning or irritation.  She has some bumps on the back of her tongue for the last 2 weeks. Not painful, but feels full. She is going to see her primary for this on Wednesday. No eye irritation.   Urine Dip:Neg  GYNECOLOGIC HISTORY: No LMP recorded. Patient is postmenopausal. Contraception:Postmenopause Menopausal hormone therapy: none        OB History    Gravida Para Term Preterm AB Living   0 0 0 0 0 0   SAB TAB Ectopic Multiple Live Births   0 0 0 0 0         Patient Active Problem List   Diagnosis Date Noted  . Breast cancer of lower-inner quadrant of left female breast ([place]) 11/01/2015  . CKD (chronic kidney disease), stage III 10/22/2015  . Family history of abdominal aortic aneurysm 03/08/2015  . Asthma, moderate persistent, well-controlled 04/19/2014  . Hypertension 11/25/2010  . Eosinophilia 02/08/2009  . Allergic rhinitis 01/31/2008  . Hyperlipidemia 12/20/2006  . Osteopenia 12/20/2006    Past Medical History:  Diagnosis Date  . ALLERGIC RHINITIS 01/31/2008  . Allergy   . Asthma   . Breast cancer ([place])   . Breast cancer of lower-inner quadrant of left female breast ([place]) 11/01/2015  . Cataract   . Colon cancer screening 05/21/2014   No polyps at age 36. Diverticulosis alone-no more colonoscopies.    Marland Kitchen DIVERTICULITIS, HX OF 09/07/2008   pt denies this   . Eosinophilia 02/08/2009  . Fibroid   . Heart murmur    slight per pt.   Marland Kitchen HYPERLIPIDEMIA 12/20/2006  . Hypertension   . Osteopenia   . OSTEOPOROSIS 12/20/2006    Past Surgical History:  Procedure Laterality Date  . BREAST LUMPECTOMY WITH NEEDLE LOCALIZATION Left 01/03/2016   Procedure: BREAST re-excision LUMPECTOMY WITH NEEDLE  LOCALIZATION;  Surgeon: Rolm Bookbinder, MD;  Location: [place];  Service: General;  Laterality: Left;  BREAST re-excision LUMPECTOMY WITH NEEDLE LOCALIZATION  . BREAST LUMPECTOMY WITH RADIOACTIVE SEED LOCALIZATION Left 11/28/2015   Procedure: LEFT BREAST LUMPECTOMY WITH BRACKETED RADIOACTIVE SEED LOCALIZATION;  Surgeon: Rolm Bookbinder, MD;  Location: [place] [facility];  Service: General;  Laterality: Left;  . BREAST SURGERY     cysts removed x2  . CATARACT EXTRACTION BILATERAL W/ ANTERIOR VITRECTOMY  2013   bilateral cataracts  . COLONOSCOPY  2005   tics only   . KNEE ARTHROSCOPY     left  . NEUROMA SURGERY     x2 feet  . thyroid duct cyst      Current Outpatient Prescriptions  Medication Sig Dispense Refill  . Albuterol Sulfate (PROAIR RESPICLICK) 123XX123 (90 BASE) MCG/ACT AEPB Inhale 1 puff into the lungs every 8 (eight) hours as needed. (Patient taking differently: Inhale 1 puff into the lungs every 8 (eight) hours as needed (for shortness of breath). ) 1 each 0  . cholecalciferol (VITAMIN D) 1000 UNITS tablet Take 2,000 Units by mouth daily.    . fluticasone (FLONASE) 50 MCG/ACT nasal spray Place 1 spray into both nostrils daily as needed.     . fluticasone furoate-vilanterol (BREO ELLIPTA) 100-25 MCG/INH AEPB Inhale 1 puff into the lungs daily.    Marland Kitchen  hydrochlorothiazide (HYDRODIURIL) 25 MG tablet TAKE 1 TABLET (25 MG TOTAL) BY MOUTH DAILY. 90 tablet 1  . losartan (COZAAR) 100 MG tablet TAKE 1 TABLET (100 MG TOTAL) BY MOUTH DAILY. 90 tablet 2  . metoprolol succinate (TOPROL-XL) 25 MG 24 hr tablet TAKE 1 TABLET (25 MG TOTAL) BY MOUTH DAILY. 90 tablet 2  . montelukast (SINGULAIR) 10 MG tablet Take 1 tablet by mouth at bedtime.    . niacin 500 MG tablet Take 500 mg by mouth at bedtime.    . Omega-3 Fatty Acids (FISH OIL) 1000 MG CAPS Take 1,000 mg by mouth Nightly.    . ondansetron (ZOFRAN) 4 MG tablet Take 1 tablet (4 mg total) by mouth every 6 (six) hours. 12  tablet 0  . tamoxifen (NOLVADEX) 20 MG tablet Take 1 tablet (20 mg total) by mouth daily. 90 tablet 3   No current facility-administered medications for this visit.      ALLERGIES: Lisinopril; Pork-derived products; Latex; Penicillins; Sulfonamide derivatives; and Tape  Family History  Problem Relation Age of Onset  . COPD Father   . Heart disease Father     stent placed 68  . Brain cancer Mother   . Colon cancer Neg Hx   . Stomach cancer Neg Hx     Social History   Social History  . Marital status: Married    Spouse name: N/A  . Number of children: N/A  . Years of education: N/A   Occupational History  . Not on file.   Social History Main Topics  . Smoking status: Never Smoker  . Smokeless tobacco: Never Used  . Alcohol use No  . Drug use: No  . Sexual activity: Yes    Partners: Male    Birth control/ protection: Post-menopausal   Other Topics Concern  . Not on file   Social History Narrative   Married. No kids- 2 step kids. 5 step grandkids.       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   PHYSICAL EXAMINATION:    BP 126/68 (BP Location: Right Arm, Patient Position: Sitting, Cuff Size: Normal)   Pulse 70   Ht 5\' 2"  (1.575 m)   Wt 152 lb 6.4 oz (69.1 kg)   BMI 27.87 kg/m     General appearance: alert, cooperative and appears stated age  HEENT: few bumps seen on the proximal portion of her tongue. No ulcerations, no erythema.  Pelvic: External genitalia:  no lesions              Urethra:  normal appearing urethra with no masses, tenderness or lesions              Bartholins and Skenes: normal                 Vagina: normal appearing atrophic vagina with normal color. Slight thin, white vaginal discharge, no lesions, grade 1 cystocele              Cervix:  no lesions               Chaperone was present for exam.  Wet prep: ? few clue, no trich, ++ wbc, some para basilar cells KOH: no yeast PH: 5.5 No odor noted  ASSESSMENT  Vaginal discharge with odor, ? BV on vaginal slides, also with WBC (more than 2/1 WBC/epithelial cells) Bumps on her tongue with the sensation of feeling full in her throat Nocturia, negative urine dip    PLAN Wet prep probe sent  Treatment based on results F/U with primary for tongue/throat c/o   An After Visit Summary was printed and given to the patient.

## 2016-06-23 ENCOUNTER — Telehealth: Payer: Self-pay

## 2016-06-23 LAB — WET PREP BY MOLECULAR PROBE
Candida species: POSITIVE — AB
Gardnerella vaginalis: NEGATIVE
Trichomonas vaginosis: NEGATIVE

## 2016-06-23 NOTE — Telephone Encounter (Signed)
-----   Message from Salvadore Dom, MD sent at 06/23/2016  7:22 AM EST ----- Please inform the patient that she has yeast and treat with diflucan 150 mg x 1, may repeat in 72 hours if still symptomatic. #2, no refills

## 2016-06-23 NOTE — Telephone Encounter (Signed)
Left message to call Bridget [place] at [phone]. 

## 2016-06-24 ENCOUNTER — Encounter: Payer: Self-pay | Admitting: Family Medicine

## 2016-06-24 ENCOUNTER — Ambulatory Visit (INDEPENDENT_AMBULATORY_CARE_PROVIDER_SITE_OTHER): Payer: Medicare HMO | Admitting: Family Medicine

## 2016-06-24 VITALS — BP 142/62 | HR 68 | Temp 97.9°F | Ht 62.0 in | Wt 151.2 lb

## 2016-06-24 DIAGNOSIS — R220 Localized swelling, mass and lump, head: Secondary | ICD-10-CM | POA: Diagnosis not present

## 2016-06-24 DIAGNOSIS — E538 Deficiency of other specified B group vitamins: Secondary | ICD-10-CM

## 2016-06-24 LAB — VITAMIN B12: Vitamin B-12: 174 pg/mL — ABNORMAL LOW (ref 211–911)

## 2016-06-24 NOTE — Progress Notes (Signed)
Pre visit review using our clinic review tool, if applicable. No additional management support is needed unless otherwise documented below in the visit note. 

## 2016-06-24 NOTE — Progress Notes (Signed)
Subjective:  Lisa Greene is a 76 y.o. year old very pleasant female patient who presents for/with See problem oriented charting ROS- no fever, chills, unintentional weight loss. Mild swelling in tonsils and some mild congestion recently.    Past Medical History-  Patient Active Problem List   Diagnosis Date Noted  . Low vitamin B12 level 06/24/2016    Priority: High  . Breast cancer of lower-inner quadrant of left female breast ([place]) 11/01/2015    Priority: High  . CKD (chronic kidney disease), stage III 10/22/2015    Priority: Medium  . Asthma, moderate persistent, well-controlled 04/19/2014    Priority: Medium  . Hypertension 11/25/2010    Priority: Medium  . Hyperlipidemia 12/20/2006    Priority: Medium  . Family history of abdominal aortic aneurysm 03/08/2015    Priority: Low  . Eosinophilia 02/08/2009    Priority: Low  . Allergic rhinitis 01/31/2008    Priority: Low  . Osteopenia 12/20/2006    Priority: Low    Medications- reviewed and updated Current Outpatient Prescriptions  Medication Sig Dispense Refill  . Albuterol Sulfate (PROAIR RESPICLICK) 123XX123 (90 BASE) MCG/ACT AEPB Inhale 1 puff into the lungs every 8 (eight) hours as needed. (Patient taking differently: Inhale 1 puff into the lungs every 8 (eight) hours as needed (for shortness of breath). ) 1 each 0  . cholecalciferol (VITAMIN D) 1000 UNITS tablet Take 2,000 Units by mouth daily.    . fluticasone (FLONASE) 50 MCG/ACT nasal spray Place 1 spray into both nostrils daily as needed.     . fluticasone furoate-vilanterol (BREO ELLIPTA) 100-25 MCG/INH AEPB Inhale 1 puff into the lungs daily.    . hydrochlorothiazide (HYDRODIURIL) 25 MG tablet TAKE 1 TABLET (25 MG TOTAL) BY MOUTH DAILY. 90 tablet 1  . losartan (COZAAR) 100 MG tablet TAKE 1 TABLET (100 MG TOTAL) BY MOUTH DAILY. 90 tablet 2  . metoprolol succinate (TOPROL-XL) 25 MG 24 hr tablet TAKE 1 TABLET (25 MG TOTAL) BY MOUTH DAILY. 90 tablet 2  . montelukast  (SINGULAIR) 10 MG tablet Take 1 tablet by mouth at bedtime.    . niacin 500 MG tablet Take 500 mg by mouth at bedtime.    . Omega-3 Fatty Acids (FISH OIL) 1000 MG CAPS Take 1,000 mg by mouth Nightly.    . ondansetron (ZOFRAN) 4 MG tablet Take 1 tablet (4 mg total) by mouth every 6 (six) hours. 12 tablet 0  . tamoxifen (NOLVADEX) 20 MG tablet Take 1 tablet (20 mg total) by mouth daily. 90 tablet 3   No current facility-administered medications for this visit.     Objective: BP (!) 142/62 (BP Location: Left Arm, Patient Position: Sitting, Cuff Size: Normal)   Pulse 68   Temp 97.9 F (36.6 C) (Oral)   Ht 5\' 2"  (1.575 m)   Wt 151 lb 3.2 oz (68.6 kg)   SpO2 97%   BMI 27.65 kg/m  Gen: NAD, resting comfortably Several more pronounced taste buds- particularly the vallate papillae. Oropharynx largely normal. Nares mild erythema and some yellow discharge No cervical lymphadenopathy other than mildly enlarged tonsils CV: RRR no murmurs rubs or gallops Lungs: CTAB no crackles, wheeze, rhonchi  Ext: no edema Skin: warm, dry, no rash on skin  Assessment/Plan:  Swollen tongue - Plan: Vitamin B12 Later discovered- b12 deficiency S: had bumps on tongue that were so large hard to swallow starting 2 weeks ago. Called allergist and advised stop breo but change to alternate- then stopped that too. Coming  off of those seemed to help slightly. Spots are getting smaller. No soreness or pain. Feels like popcorn on back of tongue that she cannot get off. No trouble swallowing. Has not noticed any symptoms except mild congestion.   She also admits to some very mild soreness in abdomen at times but continues to improve and we agreed to watch this for now.  A/P: we discussed possible infectious cause as has had some mild congestion/possible URI. I do not see thrush as cause- doubt asthma medicine related.  Sent for b12 level which came back low. This may be the culprit. Will treat with b12 weekly for a month  then change to monthly. Originally planned to follow up if not improving in 2-3 weeks though could take longer with b12 deficiency. At least with injections- nursing staff will have a chance to touch base with her. Had taken some time off tamoxifen before this started then later restarted- doubt that is related.   Orders Placed This Encounter  Procedures  . Vitamin B12   Return precautions advised.  Garret Reddish, MD

## 2016-06-24 NOTE — Patient Instructions (Signed)
This could be a reaction to upper respiratory infection (probably mild but you do appear congested)  Lets make sure b12 not low  Since improving, ok to monitor but if not better in 2-3 weeks lets reevaluate or sooner if worsens.   I think it is ok to restart asthma meds- does not look like thrush or anything those would cause- though let us know if any white discoloration of tongue

## 2016-06-25 ENCOUNTER — Ambulatory Visit (INDEPENDENT_AMBULATORY_CARE_PROVIDER_SITE_OTHER): Payer: Medicare HMO

## 2016-06-25 DIAGNOSIS — E538 Deficiency of other specified B group vitamins: Secondary | ICD-10-CM

## 2016-06-25 MED ORDER — CYANOCOBALAMIN 1000 MCG/ML IJ SOLN
1000.0000 ug | Freq: Once | INTRAMUSCULAR | Status: AC
  Administered 2016-06-25: 1000 ug via INTRAMUSCULAR

## 2016-06-26 MED ORDER — FLUCONAZOLE 150 MG PO TABS: 150.0000 mg | ORAL_TABLET | Freq: Once | ORAL | 0 refills | Status: AC

## 2016-06-26 NOTE — Telephone Encounter (Signed)
Spoke with patient. Advised of message as seen below from [place]. Patient is agreeable and verbalizes understanding. Rx for Diflucan 150 mg x1, repeat in 72 hours if symptoms persist #2 0RF sent to pharmacy on file.  Cc: Dr.Jertson  Routing to covering provider for final review. Patient agreeable to disposition. Will close encounter.

## 2016-06-29 ENCOUNTER — Telehealth: Payer: Self-pay | Admitting: Hematology and Oncology

## 2016-06-29 NOTE — Telephone Encounter (Signed)
spoke to patient husband Max Breton to advise that appointment had been changed from 07/29/16 to 07/30/16 at 11:00am

## 2016-07-02 ENCOUNTER — Ambulatory Visit (INDEPENDENT_AMBULATORY_CARE_PROVIDER_SITE_OTHER): Payer: Medicare HMO

## 2016-07-02 DIAGNOSIS — E538 Deficiency of other specified B group vitamins: Secondary | ICD-10-CM | POA: Diagnosis not present

## 2016-07-02 MED ORDER — CYANOCOBALAMIN 1000 MCG/ML IJ SOLN
1000.0000 ug | Freq: Once | INTRAMUSCULAR | Status: AC
  Administered 2016-07-02: 1000 ug via INTRAMUSCULAR

## 2016-07-02 NOTE — Progress Notes (Signed)
Low b12. Weekly injections for now Lab Results  Component Value Date   VITAMINB12 174 (L) 06/24/2016

## 2016-07-03 ENCOUNTER — Telehealth: Payer: Self-pay | Admitting: Obstetrics and Gynecology

## 2016-07-03 MED ORDER — METRONIDAZOLE 500 MG PO TABS: 500.0000 mg | ORAL_TABLET | Freq: Two times a day (BID) | ORAL | 0 refills | Status: DC

## 2016-07-03 NOTE — Telephone Encounter (Signed)
Spoke with patient. Patient states she was treated for yeast 1/22 with diflucan x2. Patient states she haas completed diflucan and has noticed no changes. Patient states she is still having clear vaginal discharge with odor. Patient denies any pain, discomfort or urinary complaints. Patient states she has been on tamoxifen and wonders if yeast is related. Advised patient Dr. Talbert Nan is out of the office today, will review with covering provider and return call, patient is agreeable.  Dr. Quincy Simmonds -please advise?  Cc: Dr. Talbert Nan

## 2016-07-03 NOTE — Telephone Encounter (Signed)
OK for Metrogel pv at hs x 5 nights or Flagyl 500 mg po bid x 7 days.  ETOH precautions.  If symptoms persist, return for recheck with Dr. Talbert Nan.   Cc - Dr. Talbert Nan

## 2016-07-03 NOTE — Telephone Encounter (Signed)
Patient finished medication for yeast infection and is still having symptoms.

## 2016-07-03 NOTE — Telephone Encounter (Signed)
Spoke with patient, advised as seen below per Dr. Quincy Simmonds. Patient would like to try Flagyl po, order placed at verified pharmacy on file. Reviewed Avoid alcohol during treatment and 24 hours after completing medication. Don't mix with alcohol if mixed can cause severe nausea, vomiting and abdominal cramping. Normal side effects of Flagyl may include metallic taste in mouth, slight nausea, headache, abdominal cramping, diarrhea and dizziness. Patient verbalizes understanding and is agreeable.  Routing to provider for final review. Patient is agreeable to disposition. Will close encounter.

## 2016-07-07 DIAGNOSIS — J302 Other seasonal allergic rhinitis: Secondary | ICD-10-CM | POA: Diagnosis not present

## 2016-07-07 DIAGNOSIS — Z7951 Long term (current) use of inhaled steroids: Secondary | ICD-10-CM | POA: Diagnosis not present

## 2016-07-07 DIAGNOSIS — I10 Essential (primary) hypertension: Secondary | ICD-10-CM | POA: Diagnosis not present

## 2016-07-07 DIAGNOSIS — J4520 Mild intermittent asthma, uncomplicated: Secondary | ICD-10-CM | POA: Diagnosis not present

## 2016-07-07 DIAGNOSIS — Z0000 Encounter for general adult medical examination without abnormal findings: Secondary | ICD-10-CM | POA: Diagnosis not present

## 2016-07-07 DIAGNOSIS — R000 Tachycardia, unspecified: Secondary | ICD-10-CM | POA: Diagnosis not present

## 2016-07-07 DIAGNOSIS — Z79899 Other long term (current) drug therapy: Secondary | ICD-10-CM | POA: Diagnosis not present

## 2016-07-09 ENCOUNTER — Ambulatory Visit (INDEPENDENT_AMBULATORY_CARE_PROVIDER_SITE_OTHER): Payer: Medicare HMO

## 2016-07-09 DIAGNOSIS — E538 Deficiency of other specified B group vitamins: Secondary | ICD-10-CM | POA: Diagnosis not present

## 2016-07-09 MED ORDER — CYANOCOBALAMIN 1000 MCG/ML IJ SOLN
1000.0000 ug | Freq: Once | INTRAMUSCULAR | Status: AC
  Administered 2016-07-09: 1000 ug via INTRAMUSCULAR

## 2016-07-10 ENCOUNTER — Telehealth: Payer: Self-pay | Admitting: Obstetrics and Gynecology

## 2016-07-10 NOTE — Telephone Encounter (Signed)
Please schedule office visit.

## 2016-07-10 NOTE — Telephone Encounter (Signed)
Spoke with patient. Patient states she has taken two doses of Diflucan and has completed full week of Flagyl. Finished Flagyl rx yesterday. Reports she is still having vaginal odor, discharge, and occasionally itching. Feels her symptoms have not decreased at all with treatment. Patient states she took Diflucan before taking Flagyl. Asking for additional recommendations before scheduling OV. Advised with receiving treatment for yeast and BV OV is recommended for further evaluation. Requests I speak with MD and return call before scheduling.  Routing to Dr.Silva for review as Dr.Jertson is out of the office today.

## 2016-07-10 NOTE — Telephone Encounter (Signed)
Call to patient. Advised Lisa Greene has reviewed call and office visit recommended. Patient agreeable but declines appointment today. States she is not uncomfortable. It is just "there" and she is ok to wait till Monday and see Lisa Greene. Appointment scheduled for 07-13-16 at 1130. Patient is aware she can call back if desires appointment today.  Encounter closed.  CC: Lisa Greene, Juluis Rainier.

## 2016-07-10 NOTE — Telephone Encounter (Signed)
Patient finished medication for yeast infection last night and she is still having symptoms.

## 2016-07-13 ENCOUNTER — Encounter: Payer: Self-pay | Admitting: Obstetrics and Gynecology

## 2016-07-13 ENCOUNTER — Ambulatory Visit (INDEPENDENT_AMBULATORY_CARE_PROVIDER_SITE_OTHER): Payer: Medicare HMO | Admitting: Obstetrics and Gynecology

## 2016-07-13 VITALS — BP 144/78 | HR 80 | Resp 16 | Wt 153.0 lb

## 2016-07-13 DIAGNOSIS — N898 Other specified noninflammatory disorders of vagina: Secondary | ICD-10-CM

## 2016-07-13 DIAGNOSIS — N9089 Other specified noninflammatory disorders of vulva and perineum: Secondary | ICD-10-CM

## 2016-07-13 NOTE — Progress Notes (Signed)
GYNECOLOGY  VISIT   HPI: 76 y.o.   Married  Caucasian  female   [id number] with No LMP recorded. Patient is postmenopausal.   Here c/o continued vaginal irritation including itching and discharge. She was treated for yeast last month, didn't help (she got dizzy with the diflucan). Dr Quincy Simmonds treated her for BV (over the phone last week). Her d/c hasn't improved. Her vaginal d/c is clear, going on for a month. Intermittently irritated, occasional itching. She had an odor, that seems to be gone. She is on Tamoxifen also for about a month.    GYNECOLOGIC HISTORY: No LMP recorded. Patient is postmenopausal. Contraception:postmenopause  Menopausal hormone therapy: none        OB History    Gravida Para Term Preterm AB Living   0 0 0 0 0 0   SAB TAB Ectopic Multiple Live Births   0 0 0 0 0         Patient Active Problem List   Diagnosis Date Noted  . Low vitamin B12 level 06/24/2016  . Breast cancer of lower-inner quadrant of left female breast ([place]) 11/01/2015  . CKD (chronic kidney disease), stage III 10/22/2015  . Family history of abdominal aortic aneurysm 03/08/2015  . Asthma, moderate persistent, well-controlled 04/19/2014  . Hypertension 11/25/2010  . Eosinophilia 02/08/2009  . Allergic rhinitis 01/31/2008  . Hyperlipidemia 12/20/2006  . Osteopenia 12/20/2006    Past Medical History:  Diagnosis Date  . ALLERGIC RHINITIS 01/31/2008  . Allergy   . Asthma   . Breast cancer ([place])   . Breast cancer of lower-inner quadrant of left female breast ([place]) 11/01/2015  . Cataract   . Colon cancer screening 05/21/2014   No polyps at age 51. Diverticulosis alone-no more colonoscopies.    Marland Kitchen DIVERTICULITIS, HX OF 09/07/2008   pt denies this   . Eosinophilia 02/08/2009  . Fibroid   . Heart murmur    slight per pt.   Marland Kitchen HYPERLIPIDEMIA 12/20/2006  . Hypertension   . Osteopenia   . OSTEOPOROSIS 12/20/2006  . Vitamin B 12 deficiency     Past Surgical History:  Procedure Laterality Date  .  BREAST LUMPECTOMY WITH NEEDLE LOCALIZATION Left 01/03/2016   Procedure: BREAST re-excision LUMPECTOMY WITH NEEDLE LOCALIZATION;  Surgeon: Rolm Bookbinder, MD;  Location: [facility];  Service: General;  Laterality: Left;  BREAST re-excision LUMPECTOMY WITH NEEDLE LOCALIZATION  . BREAST LUMPECTOMY WITH RADIOACTIVE SEED LOCALIZATION Left 11/28/2015   Procedure: LEFT BREAST LUMPECTOMY WITH BRACKETED RADIOACTIVE SEED LOCALIZATION;  Surgeon: Rolm Bookbinder, MD;  Location: [facility];  Service: General;  Laterality: Left;  . BREAST SURGERY     cysts removed x2  . CATARACT EXTRACTION BILATERAL W/ ANTERIOR VITRECTOMY  2013   bilateral cataracts  . COLONOSCOPY  2005   tics only   . KNEE ARTHROSCOPY     left  . NEUROMA SURGERY     x2 feet  . thyroid duct cyst      Current Outpatient Prescriptions  Medication Sig Dispense Refill  . Albuterol Sulfate (PROAIR RESPICLICK) 123XX123 (90 BASE) MCG/ACT AEPB Inhale 1 puff into the lungs every 8 (eight) hours as needed. (Patient taking differently: Inhale 1 puff into the lungs every 8 (eight) hours as needed (for shortness of breath). ) 1 each 0  . cholecalciferol (VITAMIN D) 1000 UNITS tablet Take 2,000 Units by mouth daily.    . fluticasone (FLONASE) 50 MCG/ACT nasal spray Place 1 spray into both nostrils daily as needed.     Marland Kitchen  fluticasone furoate-vilanterol (BREO ELLIPTA) 100-25 MCG/INH AEPB Inhale 1 puff into the lungs daily.    . hydrochlorothiazide (HYDRODIURIL) 25 MG tablet TAKE 1 TABLET (25 MG TOTAL) BY MOUTH DAILY. 90 tablet 1  . losartan (COZAAR) 100 MG tablet TAKE 1 TABLET (100 MG TOTAL) BY MOUTH DAILY. 90 tablet 2  . metoprolol succinate (TOPROL-XL) 25 MG 24 hr tablet TAKE 1 TABLET (25 MG TOTAL) BY MOUTH DAILY. 90 tablet 2  . montelukast (SINGULAIR) 10 MG tablet Take 1 tablet by mouth at bedtime.    . niacin 500 MG tablet Take 500 mg by mouth at bedtime.    . NON FORMULARY Inject as directed once a week. Vitamin B12  Injection once weekly    . Omega-3 Fatty Acids (FISH OIL) 1000 MG CAPS Take 1,000 mg by mouth Nightly.    . ondansetron (ZOFRAN) 4 MG tablet Take 1 tablet (4 mg total) by mouth every 6 (six) hours. 12 tablet 0  . tamoxifen (NOLVADEX) 20 MG tablet Take 1 tablet (20 mg total) by mouth daily. 90 tablet 3   No current facility-administered medications for this visit.      ALLERGIES: Lisinopril; Pork-derived products; Latex; Penicillins; Sulfonamide derivatives; and Tape  Family History  Problem Relation Age of Onset  . COPD Father   . Heart disease Father     stent placed 63  . Brain cancer Mother   . Colon cancer Neg Hx   . Stomach cancer Neg Hx     Social History   Social History  . Marital status: Married    Spouse name: N/A  . Number of children: N/A  . Years of education: N/A   Occupational History  . Not on file.   Social History Main Topics  . Smoking status: Never Smoker  . Smokeless tobacco: Never Used  . Alcohol use No  . Drug use: No  . Sexual activity: Yes    Partners: Male    Birth control/ protection: Post-menopausal   Other Topics Concern  . Not on file   Social History Narrative   Married. No kids- 2 step kids. 5 step grandkids.       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   HENT: Negative.   Eyes: Negative.   Respiratory: Negative.   Cardiovascular: Negative.   Gastrointestinal: Negative.   Genitourinary:       Vaginal itching and discharge   Musculoskeletal: Negative.   Skin: Negative.   Neurological: Negative.   Endo/Heme/Allergies: Negative.   Psychiatric/Behavioral: Negative.     PHYSICAL EXAMINATION:    BP (!) 144/78 (BP Location: Right Arm, Patient Position: Sitting, Cuff Size: Normal)   Pulse 80   Resp 16   Wt 153 lb (69.4 kg)   BMI 27.98 kg/m     General appearance: alert, cooperative and appears stated  age  Pelvic: External genitalia:  no lesions              Urethra:  normal appearing urethra with no masses, tenderness or lesions              Bartholins and Skenes: normal                 Vagina: normal appearing vagina with normal color and discharge, no lesions              Cervix no  lesions  Wet prep: no clue, no trich, ++ wbc (not greater than 2:1 ratio with epithelial cells) KOH: no yeast PH: 5  Chaperone was present for exam.  ASSESSMENT Vaginal discharge, no improvement after treatment for documented yeast, no improvement after treatment for possible BV On vaginal slides she has an increase in WBC, but not enough to make the diagnosis of DIV It is possible her increased d/c is from Tamoxifen, but it shouldn't cause irritation. Possibly she is getting irritated from the increased moisture    PLAN Will send off a vaginitis panel, treat accordingly Recommended she use Vaseline externally If her vaginitis panel is negative, I would suspect that her increased d/c is from the Tamoxifen (per review of records, she started this at the end of November)   An After Visit Summary was printed and given to the patient.

## 2016-07-16 ENCOUNTER — Ambulatory Visit (INDEPENDENT_AMBULATORY_CARE_PROVIDER_SITE_OTHER): Payer: Medicare HMO

## 2016-07-16 DIAGNOSIS — E538 Deficiency of other specified B group vitamins: Secondary | ICD-10-CM | POA: Diagnosis not present

## 2016-07-16 LAB — VITAMIN B12

## 2016-07-16 MED ORDER — CYANOCOBALAMIN 1000 MCG/ML IJ SOLN
1000.0000 ug | Freq: Once | INTRAMUSCULAR | Status: AC
  Administered 2016-07-16: 1000 ug via INTRAMUSCULAR

## 2016-07-16 NOTE — Progress Notes (Signed)
Patient given b12 injection today for history of B12 deficiency. Levels came back normal- will change to once a month injections  Garret Reddish

## 2016-07-16 NOTE — Addendum Note (Signed)
Addended by: Elmer Picker on: 07/16/2016 12:23 PM   Modules accepted: Orders

## 2016-07-19 LAB — SURESWAB BACTERIAL VAGINOSIS/ITIS
ATOPOBIUM VAGINAE: NOT DETECTED Log (cells/mL)
C. albicans, DNA: NOT DETECTED
C. glabrata, DNA: NOT DETECTED
C. parapsilosis, DNA: NOT DETECTED
C. tropicalis, DNA: NOT DETECTED
Gardnerella vaginalis: NOT DETECTED Log (cells/mL)
LACTOBACILLUS SPECIES: NOT DETECTED Log (cells/mL)
MEGASPHAERA SPECIES: NOT DETECTED Log (cells/mL)
T. VAGINALIS RNA, QL TMA: NOT DETECTED

## 2016-07-28 ENCOUNTER — Encounter: Payer: Self-pay | Admitting: Family Medicine

## 2016-07-28 ENCOUNTER — Ambulatory Visit (INDEPENDENT_AMBULATORY_CARE_PROVIDER_SITE_OTHER): Payer: Medicare HMO | Admitting: Family Medicine

## 2016-07-28 VITALS — BP 120/60 | HR 83 | Temp 98.2°F | Ht 62.0 in | Wt 155.5 lb

## 2016-07-28 DIAGNOSIS — R42 Dizziness and giddiness: Secondary | ICD-10-CM | POA: Diagnosis not present

## 2016-07-28 DIAGNOSIS — H6983 Other specified disorders of Eustachian tube, bilateral: Secondary | ICD-10-CM

## 2016-07-28 DIAGNOSIS — H9202 Otalgia, left ear: Secondary | ICD-10-CM

## 2016-07-28 DIAGNOSIS — H6121 Impacted cerumen, right ear: Secondary | ICD-10-CM | POA: Diagnosis not present

## 2016-07-28 MED ORDER — MECLIZINE HCL 25 MG PO TABS: 25.0000 mg | ORAL_TABLET | Freq: Three times a day (TID) | ORAL | 0 refills | Status: DC | PRN

## 2016-07-28 MED ORDER — AZITHROMYCIN 250 MG PO TABS: ORAL_TABLET | ORAL | 0 refills | Status: DC

## 2016-07-28 NOTE — Patient Instructions (Signed)
For the fluid in the ears and ear pain: -afrin for 3 days per instruction - then stop -flonase for 3 weeks per instructions -antibiotic if not improving or further pain  For the vertigo: -please let me know if you want to do the vestibular rehab - a special type of physical therapy for this -meclizine helps the symptoms only -do not drive with vertigo  I hope you are feeling better soon! Seek care immediately if worsening, new concerns or you are not improving with treatment.  **Coming March 12th**  [facility]!!!  Same Day Appointments for Acute Care: Sprains, Injuries, cuts, abrasions Colds, fevers, flu, sore throats, upset stomachs Fever, ear pain Sinus and eye infections Animal/insect bites  3 Easy Ways to Schedule: Walk-In Scheduling Call in scheduling Mychart Sign-up: [email or web address]

## 2016-07-28 NOTE — Progress Notes (Signed)
HPI:  Acute visit for:  Vertigo: -intermittent spells with what sound like dx BPPV in the past (reports inner ear issue treated with exercises) -had a few brief spells vertigo today when turned head certain ways, did not resolve with her exercises -no weakness, numbness, HA, changes in speech, hearing loss  Ear discomfort/Sinus congestion: -has hx allergies and asthma -for last 3-4 days nasal congestion, sinus pressure, bilat ear pressure, L ear pain today -denies fevers, chills, hearing loss, drainage from ear, HA, neck pain, NVD, body aches  ROS: See pertinent positives and negatives per HPI.  Past Medical History:  Diagnosis Date  . ALLERGIC RHINITIS 01/31/2008  . Allergy   . Asthma   . Breast cancer ([place])   . Breast cancer of lower-inner quadrant of left female breast ([place]) 11/01/2015  . Cataract   . Colon cancer screening 05/21/2014   No polyps at age 31. Diverticulosis alone-no more colonoscopies.    Marland Kitchen DIVERTICULITIS, HX OF 09/07/2008   pt denies this   . Eosinophilia 02/08/2009  . Fibroid   . Heart murmur    slight per pt.   Marland Kitchen HYPERLIPIDEMIA 12/20/2006  . Hypertension   . Osteopenia   . OSTEOPOROSIS 12/20/2006  . Vitamin B 12 deficiency     Past Surgical History:  Procedure Laterality Date  . BREAST LUMPECTOMY WITH NEEDLE LOCALIZATION Left 01/03/2016   Procedure: BREAST re-excision LUMPECTOMY WITH NEEDLE LOCALIZATION;  Surgeon: Rolm Bookbinder, MD;  Location: [facility];  Service: General;  Laterality: Left;  BREAST re-excision LUMPECTOMY WITH NEEDLE LOCALIZATION  . BREAST LUMPECTOMY WITH RADIOACTIVE SEED LOCALIZATION Left 11/28/2015   Procedure: LEFT BREAST LUMPECTOMY WITH BRACKETED RADIOACTIVE SEED LOCALIZATION;  Surgeon: Rolm Bookbinder, MD;  Location: [facility];  Service: General;  Laterality: Left;  . BREAST SURGERY     cysts removed x2  . CATARACT EXTRACTION BILATERAL W/ ANTERIOR VITRECTOMY  2013   bilateral cataracts  .  COLONOSCOPY  2005   tics only   . KNEE ARTHROSCOPY     left  . NEUROMA SURGERY     x2 feet  . thyroid duct cyst      Family History  Problem Relation Age of Onset  . COPD Father   . Heart disease Father     stent placed 1  . Brain cancer Mother   . Colon cancer Neg Hx   . Stomach cancer Neg Hx     Social History   Social History  . Marital status: Married    Spouse name: N/A  . Number of children: N/A  . Years of education: N/A   Social History Main Topics  . Smoking status: Never Smoker  . Smokeless tobacco: Never Used  . Alcohol use No  . Drug use: No  . Sexual activity: Yes    Partners: Male    Birth control/ protection: Post-menopausal   Other Topics Concern  . None   Social History Narrative   Married. No kids- 2 step kids. 5 step grandkids.       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  Albuterol Sulfate (PROAIR RESPICLICK) 123XX123 (90 BASE) MCG/ACT AEPB, Inhale 1 puff into the lungs every 8 (eight) hours as needed. (Patient taking differently: Inhale 1 puff into the lungs every 8 (eight) hours as needed (for shortness of breath). ), Disp: 1 each, Rfl: 0 .  cholecalciferol (VITAMIN D) 1000 UNITS tablet, Take 2,000 Units by mouth daily., Disp: , Rfl:  .  fluticasone (FLONASE) 50 MCG/ACT nasal spray, Place 1 spray into both nostrils daily as needed. , Disp: , Rfl:  .  fluticasone furoate-vilanterol (BREO ELLIPTA) 100-25 MCG/INH AEPB, Inhale 1 puff into the lungs daily., Disp: , Rfl:  .  hydrochlorothiazide (HYDRODIURIL) 25 MG tablet, TAKE 1 TABLET (25 MG TOTAL) BY MOUTH DAILY., Disp: 90 tablet, Rfl: 1 .  losartan (COZAAR) 100 MG tablet, TAKE 1 TABLET (100 MG TOTAL) BY MOUTH DAILY., Disp: 90 tablet, Rfl: 2 .  metoprolol succinate (TOPROL-XL) 25 MG 24 hr tablet, TAKE 1 TABLET (25 MG TOTAL) BY MOUTH DAILY., Disp: 90 tablet, Rfl: 2 .  montelukast  (SINGULAIR) 10 MG tablet, Take 1 tablet by mouth at bedtime., Disp: , Rfl:  .  niacin 500 MG tablet, Take 500 mg by mouth at bedtime., Disp: , Rfl:  .  NON FORMULARY, Inject as directed once a week. Vitamin B12 Injection once weekly, Disp: , Rfl:  .  Omega-3 Fatty Acids (FISH OIL) 1000 MG CAPS, Take 1,000 mg by mouth Nightly., Disp: , Rfl:  .  ondansetron (ZOFRAN) 4 MG tablet, Take 1 tablet (4 mg total) by mouth every 6 (six) hours., Disp: 12 tablet, Rfl: 0 .  tamoxifen (NOLVADEX) 20 MG tablet, Take 1 tablet (20 mg total) by mouth daily., Disp: 90 tablet, Rfl: 3 .  azithromycin (ZITHROMAX) 250 MG tablet, 2 tabs day 1, then one tab daily, Disp: 6 tablet, Rfl: 0 .  meclizine (ANTIVERT) 25 MG tablet, Take 1 tablet (25 mg total) by mouth 3 (three) times daily as needed for dizziness., Disp: 30 tablet, Rfl: 0  EXAM:  Vitals:   07/28/16 1423  BP: 120/60  Pulse: 83  Temp: 98.2 F (36.8 C)    Body mass index is 28.44 kg/m.  GENERAL: vitals reviewed and listed above, alert, oriented, appears well hydrated and in no acute distress  HEENT: atraumatic, conjunttiva clear, no obvious abnormalities on inspection of external nose and ears, normal appearance of ear canals and TMs except for cerumen impaction R - after wax removed effusion of middle ear bilat with mild bulging and mildly discolored fluid L, clear nasal congestion, mild post oropharyngeal erythema with PND, no tonsillar edema or exudate, no sinus TTP, no mastoid TTP  NECK: no obvious masses on inspection  LUNGS: clear to auscultation bilaterally, no wheezes, rales or rhonchi, good air movement  CV: HRRR, no peripheral edema  MS: moves all extremities without noticeable abnormality  PSYCH/NEURO: pleasant and cooperative, no obvious depression or anxiety, CN II-XII grossly intact, finger to nose normal, dix hallpike to L reproduces vertigo with mild rot nystagmus  ASSESSMENT AND PLAN:  Discussed the following assessment and  plan:  Vertigo -we discussed possible serious and likely etiologies, workup and treatment, treatment risks and return precautions - may be multifactorial with BPPV and MEE -after this discussion, Hena opted for tx ear effusion, meclizine, home exercise and consideration formal vestibular rehab -follow up advised if worsening or not improving over the next few days -of course, we advised Sadiee  to return or notify a doctor immediately if symptoms worsen or persist or new concerns arise.  Left ear pain -MEE, likely secondary to mild VURI vs allergies -short course nasal decongestant, 3 weeks flonase -abx rx in case worsening or not improving as may be developing AOM on the L  Impacted cerumen of right ear -wax removed with soft curette,  tolerated well  Dysfunction of both eustachian tubes -see above  -Patient advised to return or notify a doctor immediately if symptoms worsen or persist or new concerns arise.  Patient Instructions  For the fluid in the ears and ear pain: -afrin for 3 days per instruction - then stop -flonase for 3 weeks per instructions -antibiotic if not improving or further pain  For the vertigo: -please let me know if you want to do the vestibular rehab - a special type of physical therapy for this -meclizine helps the symptoms only -do not drive with vertigo  I hope you are feeling better soon! Seek care immediately if worsening, new concerns or you are not improving with treatment.  **Coming March 12th**  [facility]!!!  Same Day Appointments for Acute Care: Sprains, Injuries, cuts, abrasions Colds, fevers, flu, sore throats, upset stomachs Fever, ear pain Sinus and eye infections Animal/insect bites  3 Easy Ways to Schedule: Walk-In Scheduling Call in scheduling Mychart Sign-up: [email or web address]           Colin Benton R., DO

## 2016-07-28 NOTE — Progress Notes (Signed)
Pre visit review using our clinic review tool, if applicable. No additional management support is needed unless otherwise documented below in the visit note. 

## 2016-07-29 ENCOUNTER — Ambulatory Visit: Payer: Medicare HMO | Admitting: Hematology and Oncology

## 2016-07-30 ENCOUNTER — Encounter: Payer: Self-pay | Admitting: Hematology and Oncology

## 2016-07-30 ENCOUNTER — Ambulatory Visit (HOSPITAL_BASED_OUTPATIENT_CLINIC_OR_DEPARTMENT_OTHER): Payer: Medicare HMO | Admitting: Hematology and Oncology

## 2016-07-30 DIAGNOSIS — C50312 Malignant neoplasm of lower-inner quadrant of left female breast: Secondary | ICD-10-CM | POA: Diagnosis not present

## 2016-07-30 DIAGNOSIS — Z170 Estrogen receptor positive status [ER+]: Secondary | ICD-10-CM

## 2016-07-30 DIAGNOSIS — N6001 Solitary cyst of right breast: Secondary | ICD-10-CM

## 2016-07-30 NOTE — Progress Notes (Signed)
Patient Care Team: Marin Olp, MD as PCP - General (Family Medicine) Rolm Bookbinder, MD as Consulting Physician (General Surgery) Nicholas Lose, MD as Consulting Physician (Hematology and Oncology) Kyung Rudd, MD as Consulting Physician (Radiation Oncology)  DIAGNOSIS:  Encounter Diagnosis  Name Primary?  . Malignant neoplasm of lower-inner quadrant of left breast in female, estrogen receptor positive ([place])     SUMMARY OF ONCOLOGIC HISTORY:   Breast cancer of lower-inner quadrant of left female breast ([place])   10/30/2015 Initial Diagnosis    Left breast biopsy: DCIS intermediate grade with calcifications (micropapillary architecture), fibrocystic changes with adenosis, complex sclerosing lesion, UDH, ER 95%, PR 80%; mammogram in ultrasound showed right breast calcifications 4.1 cm      11/28/2015 Surgery    Left lumpectomy: DCIS with calcifications, high-grade, 1.3 cm, focally less than 0.1 cm to posterior margin and focally less than 0.1 cm lateral margin, ER 95%, PR 80%, TisN0 stage 0      02/05/2016 - 03/03/2016 Radiation Therapy    Adj XRT      04/30/2016 -  Anti-estrogen oral therapy    Tamoxifen 20 mg daily       CHIEF COMPLIANT: Follow-up on tamoxifen therapy  INTERVAL HISTORY: Lisa Greene is a 76 year old with above-mentioned history of left breast cancer treated with lumpectomy and radiation is currently on tamoxifen therapy. She appears to be tolerating tamoxifen extremely well. During the past several months she has had multiple health issues including hospitalization for diverticulitis as well as recent problems with sinus infections since influenza. Her husband also had significant health issues including 40. To be strokelike symptoms after drinking Cokes Zero. She denies any lumps or nodules in the breast.  REVIEW OF SYSTEMS:   Constitutional: Denies fevers, chills or abnormal weight loss Eyes: Denies blurriness of vision Ears, nose, mouth, throat, and  face: Denies mucositis or sore throat Respiratory: Denies cough, dyspnea or wheezes Cardiovascular: Denies palpitation, chest discomfort Gastrointestinal:  Denies nausea, heartburn or change in bowel habits Skin: Denies abnormal skin rashes Lymphatics: Denies new lymphadenopathy or easy bruising Neurological:Denies numbness, tingling or new weaknesses Behavioral/Psych: Mood is stable, no new changes  Extremities: No lower extremity edema Breast:  denies any pain or lumps or nodules in either breasts All other systems were reviewed with the patient and are negative.  I have reviewed the past medical history, past surgical history, social history and family history with the patient and they are unchanged from previous note.  ALLERGIES:  is allergic to lisinopril; pork-derived products; latex; penicillins; sulfonamide derivatives; and tape.  MEDICATIONS:  Current Outpatient Prescriptions  Medication Sig Dispense Refill  . Albuterol Sulfate (PROAIR RESPICLICK) 123XX123 (90 BASE) MCG/ACT AEPB Inhale 1 puff into the lungs every 8 (eight) hours as needed. (Patient taking differently: Inhale 1 puff into the lungs every 8 (eight) hours as needed (for shortness of breath). ) 1 each 0  . azithromycin (ZITHROMAX) 250 MG tablet 2 tabs day 1, then one tab daily 6 tablet 0  . cholecalciferol (VITAMIN D) 1000 UNITS tablet Take 2,000 Units by mouth daily.    . fluticasone (FLONASE) 50 MCG/ACT nasal spray Place 1 spray into both nostrils daily as needed.     . fluticasone furoate-vilanterol (BREO ELLIPTA) 100-25 MCG/INH AEPB Inhale 1 puff into the lungs daily.    . hydrochlorothiazide (HYDRODIURIL) 25 MG tablet TAKE 1 TABLET (25 MG TOTAL) BY MOUTH DAILY. 90 tablet 1  . losartan (COZAAR) 100 MG tablet TAKE 1 TABLET (100 MG  TOTAL) BY MOUTH DAILY. 90 tablet 2  . meclizine (ANTIVERT) 25 MG tablet Take 1 tablet (25 mg total) by mouth 3 (three) times daily as needed for dizziness. 30 tablet 0  . metoprolol succinate  (TOPROL-XL) 25 MG 24 hr tablet TAKE 1 TABLET (25 MG TOTAL) BY MOUTH DAILY. 90 tablet 2  . montelukast (SINGULAIR) 10 MG tablet Take 1 tablet by mouth at bedtime.    . niacin 500 MG tablet Take 500 mg by mouth at bedtime.    . NON FORMULARY Inject as directed once a week. Vitamin B12 Injection once weekly    . Omega-3 Fatty Acids (FISH OIL) 1000 MG CAPS Take 1,000 mg by mouth Nightly.    . ondansetron (ZOFRAN) 4 MG tablet Take 1 tablet (4 mg total) by mouth every 6 (six) hours. 12 tablet 0  . tamoxifen (NOLVADEX) 20 MG tablet Take 1 tablet (20 mg total) by mouth daily. 90 tablet 3   No current facility-administered medications for this visit.     PHYSICAL EXAMINATION: ECOG PERFORMANCE STATUS: 1 - Symptomatic but completely ambulatory  Vitals:   07/30/16 1111  BP: (!) 140/52  Pulse: 77  Resp: 18  Temp: 98 F (36.7 C)   Filed Weights   07/30/16 1111  Weight: 152 lb 12.8 oz (69.3 kg)    GENERAL:alert, no distress and comfortable SKIN: skin color, texture, turgor are normal, no rashes or significant lesions EYES: normal, Conjunctiva are pink and non-injected, sclera clear OROPHARYNX:no exudate, no erythema and lips, buccal mucosa, and tongue normal  NECK: supple, thyroid normal size, non-tender, without nodularity LYMPH:  no palpable lymphadenopathy in the cervical, axillary or inguinal LUNGS: clear to auscultation and percussion with normal breathing effort HEART: regular rate & rhythm and no murmurs and no lower extremity edema ABDOMEN:abdomen soft, non-tender and normal bowel sounds MUSCULOSKELETAL:no cyanosis of digits and no clubbing  NEURO: alert & oriented x 3 with fluent speech, no focal motor/sensory deficits EXTREMITIES: No lower extremity edema  LABORATORY DATA:  I have reviewed the data as listed   Chemistry      Component Value Date/Time   NA 124 (L) 05/16/2016 1938   NA 137 11/06/2015 1219   K 3.1 (L) 05/16/2016 1938   K 4.3 11/06/2015 1219   CL 93 (L)  05/16/2016 1938   CO2 21 (L) 05/16/2016 1938   CO2 26 11/06/2015 1219   BUN 9 05/16/2016 1938   BUN 12.8 11/06/2015 1219   CREATININE 0.99 05/16/2016 1938   CREATININE 1.1 11/06/2015 1219      Component Value Date/Time   CALCIUM 8.5 (L) 05/16/2016 1938   CALCIUM 9.9 11/06/2015 1219   ALKPHOS 41 05/16/2016 1938   ALKPHOS 63 11/06/2015 1219   AST 23 05/16/2016 1938   AST 15 11/06/2015 1219   ALT 15 05/16/2016 1938   ALT 13 11/06/2015 1219   BILITOT 0.6 05/16/2016 1938   BILITOT 0.57 11/06/2015 1219       Lab Results  Component Value Date   WBC 7.1 05/16/2016   HGB 12.1 05/16/2016   HCT 33.5 (L) 05/16/2016   MCV 84.0 05/16/2016   PLT 303 05/16/2016   NEUTROABS 5.0 05/11/2016    ASSESSMENT & PLAN:  Breast cancer of lower-inner quadrant of left female breast ([place]) Left lumpectomy 11/28/2015: DCIS with calcifications, high-grade, 1.3 cm, focally less than 0.1 cm to posterior margin and focally less than 0.1 cm lateral margin, ER 95%, PR 80%, TisN0 stage 0  Adj XRT 02/05/16- 03/03/16  Current Treatment: Adj Tamoxifen 20 mg daily X 5 years started 04/30/2016 Tamoxifen toxicities:Denies any hot flashes or myalgias.  CT chest 05/17/2016 performed for evaluation of left lower quadrant pain and nausea revealed right breast masses suspicious for cancer. Ultrasound of the right breast performed on 06/09/2016: There are multiple cysts in the right breast with a benign. No sonographic evidence of malignancy.  Return to clinic in 6 months for follow-up  I spent 25 minutes talking to the patient of which more than half was spent in counseling and coordination of care.  No orders of the defined types were placed in this encounter.  The patient has a good understanding of the overall plan. she agrees with it. she will call with any problems that may develop before the next visit here.   Rulon Eisenmenger, MD 07/30/16

## 2016-07-30 NOTE — Assessment & Plan Note (Signed)
Left lumpectomy 11/28/2015: DCIS with calcifications, high-grade, 1.3 cm, focally less than 0.1 cm to posterior margin and focally less than 0.1 cm lateral margin, ER 95%, PR 80%, TisN0 stage 0  Adj XRT 02/05/16- 03/03/16 Current Treatment: Adj Tamoxifen 20 mg daily X 5 years started 04/30/2016 Tamoxifen toxicities:  CT chest 05/17/2016 performed for evaluation of left lower quadrant pain and nausea revealed right breast masses suspicious for cancer. Ultrasound of the right breast performed on 06/09/2016: There are multiple cysts in the right breast with a benign. No sonographic evidence of malignancy.  Return to clinic in 6 months for follow-up

## 2016-08-04 DIAGNOSIS — Z85828 Personal history of other malignant neoplasm of skin: Secondary | ICD-10-CM | POA: Diagnosis not present

## 2016-08-04 DIAGNOSIS — L728 Other follicular cysts of the skin and subcutaneous tissue: Secondary | ICD-10-CM | POA: Diagnosis not present

## 2016-08-04 DIAGNOSIS — M7130 Other bursal cyst, unspecified site: Secondary | ICD-10-CM | POA: Diagnosis not present

## 2016-08-14 ENCOUNTER — Ambulatory Visit (INDEPENDENT_AMBULATORY_CARE_PROVIDER_SITE_OTHER): Payer: Medicare HMO

## 2016-08-14 DIAGNOSIS — E538 Deficiency of other specified B group vitamins: Secondary | ICD-10-CM

## 2016-08-14 MED ORDER — CYANOCOBALAMIN 1000 MCG/ML IJ SOLN
1000.0000 ug | Freq: Once | INTRAMUSCULAR | Status: AC
  Administered 2016-08-14: 1000 ug via INTRAMUSCULAR

## 2016-08-14 NOTE — Progress Notes (Addendum)
Patient seen for b12 injection; administered injection to left deltoid; patient tolerated well; patient scheduled appointment for next injection in one month.   Lab Results  Component Value Date   VITAMINB12 >1500 (H) 07/16/2016  appears to be doing well. Will continue on monthly basis- was symptomatic with swollen tongue at time of diagnosis.   I have reviewed and agree with note, evaluation, plan.   Garret Reddish, MD

## 2016-09-15 ENCOUNTER — Ambulatory Visit (INDEPENDENT_AMBULATORY_CARE_PROVIDER_SITE_OTHER): Payer: Medicare HMO

## 2016-09-15 DIAGNOSIS — E538 Deficiency of other specified B group vitamins: Secondary | ICD-10-CM

## 2016-09-15 MED ORDER — CYANOCOBALAMIN 1000 MCG/ML IJ SOLN
1000.0000 ug | Freq: Once | INTRAMUSCULAR | Status: AC
  Administered 2016-09-15: 1000 ug via INTRAMUSCULAR

## 2016-09-15 NOTE — Progress Notes (Signed)
Patient in for b12 injection; administered injection IM to left deltoid; patient tolerated well; no s/s of reactions.

## 2016-09-22 ENCOUNTER — Encounter: Payer: Self-pay | Admitting: Family Medicine

## 2016-09-22 ENCOUNTER — Ambulatory Visit (INDEPENDENT_AMBULATORY_CARE_PROVIDER_SITE_OTHER): Payer: Medicare HMO | Admitting: Family Medicine

## 2016-09-22 VITALS — BP 140/70 | HR 80 | Temp 98.4°F | Wt 153.6 lb

## 2016-09-22 DIAGNOSIS — I10 Essential (primary) hypertension: Secondary | ICD-10-CM | POA: Diagnosis not present

## 2016-09-22 DIAGNOSIS — I493 Ventricular premature depolarization: Secondary | ICD-10-CM

## 2016-09-22 DIAGNOSIS — R002 Palpitations: Secondary | ICD-10-CM

## 2016-09-22 DIAGNOSIS — R5383 Other fatigue: Secondary | ICD-10-CM | POA: Diagnosis not present

## 2016-09-22 NOTE — Progress Notes (Addendum)
Subjective:  Lisa Greene is a 76 y.o. year old very pleasant female patient who presents for/with See problem oriented charting ROS- No chest pain or shortness of breath. No headache or blurry vision. No dizziness. Mild fatigue.    Past Medical History-  Patient Active Problem List   Diagnosis Date Noted  . Low vitamin B12 level 06/24/2016    Priority: High  . Breast cancer of lower-inner quadrant of left female breast ([place]) 11/01/2015    Priority: High  . CKD (chronic kidney disease), stage III 10/22/2015    Priority: Medium  . Asthma, moderate persistent, well-controlled 04/19/2014    Priority: Medium  . Hypertension 11/25/2010    Priority: Medium  . Hyperlipidemia 12/20/2006    Priority: Medium  . Family history of abdominal aortic aneurysm 03/08/2015    Priority: Low  . Eosinophilia 02/08/2009    Priority: Low  . Allergic rhinitis 01/31/2008    Priority: Low  . Osteopenia 12/20/2006    Priority: Low    Medications- reviewed and updated Current Outpatient Prescriptions  Medication Sig Dispense Refill  . Albuterol Sulfate (PROAIR RESPICLICK) 170 (90 BASE) MCG/ACT AEPB Inhale 1 puff into the lungs every 8 (eight) hours as needed. (Patient taking differently: Inhale 1 puff into the lungs every 8 (eight) hours as needed (for shortness of breath). ) 1 each 0  . azithromycin (ZITHROMAX) 250 MG tablet 2 tabs day 1, then one tab daily 6 tablet 0  . cholecalciferol (VITAMIN D) 1000 UNITS tablet Take 2,000 Units by mouth daily.    . fluticasone (FLONASE) 50 MCG/ACT nasal spray Place 1 spray into both nostrils daily as needed.     . fluticasone furoate-vilanterol (BREO ELLIPTA) 100-25 MCG/INH AEPB Inhale 1 puff into the lungs daily.    . hydrochlorothiazide (HYDRODIURIL) 25 MG tablet TAKE 1 TABLET (25 MG TOTAL) BY MOUTH DAILY. 90 tablet 1  . losartan (COZAAR) 100 MG tablet TAKE 1 TABLET (100 MG TOTAL) BY MOUTH DAILY. 90 tablet 2  . meclizine (ANTIVERT) 25 MG tablet Take 1 tablet  (25 mg total) by mouth 3 (three) times daily as needed for dizziness. 30 tablet 0  . metoprolol succinate (TOPROL-XL) 25 MG 24 hr tablet TAKE 1 TABLET (25 MG TOTAL) BY MOUTH DAILY. 90 tablet 2  . montelukast (SINGULAIR) 10 MG tablet Take 1 tablet by mouth at bedtime.    . niacin 500 MG tablet Take 500 mg by mouth at bedtime.    . NON FORMULARY Inject as directed once a week. Vitamin B12 Injection once weekly    . Omega-3 Fatty Acids (FISH OIL) 1000 MG CAPS Take 1,000 mg by mouth Nightly.    . tamoxifen (NOLVADEX) 20 MG tablet Take 1 tablet (20 mg total) by mouth daily. 90 tablet 3   No current facility-administered medications for this visit.     Objective: BP 140/70 (BP Location: Right Arm, Patient Position: Sitting, Cuff Size: Normal)   Pulse 80   Temp 98.4 F (36.9 C) (Oral)   Wt 153 lb 9.6 oz (69.7 kg)   SpO2 96%   BMI 28.09 kg/m  Gen: NAD, resting comfortably No obvious thyromegaly CV: regularly irregular heart rate.  no murmurs rubs or gallops Lungs: CTAB no crackles, wheeze, rhonchi Ext: no edema Skin: warm, dry Neuro: grossly normal, moves all extremities  EKG: quadrigeminy with rate 80, normal axis, normal intervals, no st or t wave changes   Assessment/Plan:  Palpitations fatigue S: feeling palpitations (skipped beat) for about a week.  Checked pulse and felt like it skipped. Taking metoprolol each evening XL. Sometimes worse with allergies flared up but not bothering her. Slight fatigue. Fatigue seems to be better with rest but palpitations still present. Had something like this 2 years ago but EKG reassuring with NSR at that time. No medications have been tried for this  Ros- no chest pain, shortness of breath, dizziness, no left arm or neck pain, no diaphoresis A/P: Patient with mild fatigue and palpitations.new quadrigeminy in patient with risk factors including age, HLD, HTN.  Patient asks for referral to Dr. Daneen Schick- husband sees him. Will also get basic labs to  include cbc, cmp, TSH  Will have to watch BP- slightly elevated on last 2 checks when previously controlled.   Orders Placed This Encounter  Procedures  . CBC    [facility]  . Comprehensive metabolic panel    [facility]  . TSH    [facility]  . Ambulatory referral to Cardiology    Referral Priority:   Routine    Referral Type:   Consultation    Referral Reason:   Specialty Services Required    Referred to Provider:   Belva Crome, MD    Requested Specialty:   Cardiology    Number of Visits Requested:   1  . EKG 12-Lead    Order Specific Question:   Where should this test be performed    Answer:   Other   Return precautions advised.  Garret Reddish, MD

## 2016-09-22 NOTE — Patient Instructions (Addendum)
Please stop by lab before you go  We will call you within a week or two about your referral to Dr. Tamala Julian. If you do not hear within 3 weeks, give [place] a call.   Let us know if you have new or worsening symptoms such as chest pain, shortness of breath, dizziness, left arm or neck pain,abnormal sweating __________________________________________________ WE NOW OFFER   [facility] FAST TRACK!!!  SAME DAY Appointments for ACUTE CARE  Such as: Sprains, Injuries, cuts, abrasions, rashes, muscle pain, joint pain, back pain Colds, flu, sore throats, headache, allergies, cough, fever  Ear pain, sinus and eye infections Abdominal pain, nausea, vomiting, diarrhea, upset stomach Animal/insect bites  3 Easy Ways to Schedule: Walk-In Scheduling Call in scheduling Mychart Sign-up: [email or web address]

## 2016-09-23 LAB — CBC
HCT: 38.2 % (ref 36.0–46.0)
HEMOGLOBIN: 13.1 g/dL (ref 12.0–15.0)
MCHC: 34.4 g/dL (ref 30.0–36.0)
MCV: 91.5 fl (ref 78.0–100.0)
PLATELETS: 322 10*3/uL (ref 150.0–400.0)
RBC: 4.18 Mil/uL (ref 3.87–5.11)
RDW: 13 % (ref 11.5–15.5)
WBC: 7.9 10*3/uL (ref 4.0–10.5)

## 2016-09-23 LAB — COMPREHENSIVE METABOLIC PANEL
ALK PHOS: 42 U/L (ref 39–117)
ALT: 13 U/L (ref 0–35)
AST: 15 U/L (ref 0–37)
Albumin: 4.2 g/dL (ref 3.5–5.2)
BILIRUBIN TOTAL: 0.4 mg/dL (ref 0.2–1.2)
BUN: 18 mg/dL (ref 6–23)
CALCIUM: 9.2 mg/dL (ref 8.4–10.5)
CO2: 26 mEq/L (ref 19–32)
CREATININE: 1.22 mg/dL — AB (ref 0.40–1.20)
Chloride: 99 mEq/L (ref 96–112)
GFR: 45.52 mL/min — AB (ref >60.00)
Glucose, Bld: 77 mg/dL (ref 70–99)
Potassium: 4 mEq/L (ref 3.5–5.1)
Sodium: 133 mEq/L — ABNORMAL LOW (ref 135–145)
TOTAL PROTEIN: 6.4 g/dL (ref 6.0–8.3)

## 2016-09-23 LAB — TSH: TSH: 3.47 u[IU]/mL (ref 0.35–4.50)

## 2016-09-24 ENCOUNTER — Telehealth: Payer: Self-pay | Admitting: Family Medicine

## 2016-09-24 NOTE — Telephone Encounter (Signed)
Order was entered 09/22/16

## 2016-09-24 NOTE — Telephone Encounter (Signed)
Didn't I order this yesterday?

## 2016-09-24 NOTE — Telephone Encounter (Signed)
Pt would like to move forward to see a Cardiologist.

## 2016-10-06 DIAGNOSIS — Z853 Personal history of malignant neoplasm of breast: Secondary | ICD-10-CM | POA: Diagnosis not present

## 2016-10-06 DIAGNOSIS — N6011 Diffuse cystic mastopathy of right breast: Secondary | ICD-10-CM | POA: Diagnosis not present

## 2016-10-06 LAB — HM MAMMOGRAPHY

## 2016-10-06 NOTE — Progress Notes (Signed)
Received mammogram results f.rom [facility]. Sent to scan

## 2016-10-08 ENCOUNTER — Encounter: Payer: Self-pay | Admitting: Family Medicine

## 2016-10-19 ENCOUNTER — Ambulatory Visit (INDEPENDENT_AMBULATORY_CARE_PROVIDER_SITE_OTHER): Payer: Medicare HMO | Admitting: *Deleted

## 2016-10-19 DIAGNOSIS — E538 Deficiency of other specified B group vitamins: Secondary | ICD-10-CM | POA: Diagnosis not present

## 2016-10-19 MED ORDER — CYANOCOBALAMIN 1000 MCG/ML IJ SOLN
1000.0000 ug | Freq: Once | INTRAMUSCULAR | Status: AC
  Administered 2016-10-19: 1000 ug via INTRAMUSCULAR

## 2016-10-19 NOTE — Progress Notes (Signed)
Patient in for b12 injection; administered injection IM to right deltoid, patient tolerated well; no s/s of reactions

## 2016-10-27 ENCOUNTER — Telehealth: Payer: Self-pay

## 2016-10-27 NOTE — Telephone Encounter (Signed)
Spoke with pt regarding symptoms of fatigue and aches from tamoxifen. Pt states that she had been on tamoxifen for 5 months now and has gradually decreased activity due aches/pain and fatigue, Pt states that she had been off tamoxifen for 1 week now and has improved her energy level. Wants to know if she should try something else or try to give tamoxifen another try. Advised pt that she may wait another week to restart tamoxifen on half a pill. Pt will need to call us back 1 week after restarting medication to give [place] an update of how she is doing with the medication. Pt agrees with plan and will call in 2 weeks. No further concerns at this time.

## 2016-10-27 NOTE — Telephone Encounter (Signed)
Called pt back to return her vm regarding stopping tamoxifen due to increased weakness. LVM with call back number.

## 2016-10-29 ENCOUNTER — Other Ambulatory Visit (INDEPENDENT_AMBULATORY_CARE_PROVIDER_SITE_OTHER): Payer: Medicare HMO

## 2016-10-29 DIAGNOSIS — I10 Essential (primary) hypertension: Secondary | ICD-10-CM | POA: Diagnosis not present

## 2016-10-29 DIAGNOSIS — E785 Hyperlipidemia, unspecified: Secondary | ICD-10-CM

## 2016-10-29 DIAGNOSIS — E538 Deficiency of other specified B group vitamins: Secondary | ICD-10-CM

## 2016-10-29 DIAGNOSIS — N183 Chronic kidney disease, stage 3 unspecified: Secondary | ICD-10-CM

## 2016-10-29 DIAGNOSIS — Z0000 Encounter for general adult medical examination without abnormal findings: Secondary | ICD-10-CM | POA: Diagnosis not present

## 2016-10-29 LAB — LIPID PANEL
CHOL/HDL RATIO: 3
CHOLESTEROL: 188 mg/dL (ref 0–200)
HDL: 73.2 mg/dL (ref >39.00)
LDL Cholesterol: 91 mg/dL (ref 0–99)
NonHDL: 114.94
TRIGLYCERIDES: 120 mg/dL (ref 0.0–149.0)
VLDL: 24 mg/dL (ref 0.0–40.0)

## 2016-10-29 LAB — COMPREHENSIVE METABOLIC PANEL
ALBUMIN: 4.1 g/dL (ref 3.5–5.2)
ALK PHOS: 41 U/L (ref 39–117)
ALT: 12 U/L (ref 0–35)
AST: 13 U/L (ref 0–37)
BILIRUBIN TOTAL: 0.6 mg/dL (ref 0.2–1.2)
BUN: 16 mg/dL (ref 6–23)
CALCIUM: 9.1 mg/dL (ref 8.4–10.5)
CO2: 28 mEq/L (ref 19–32)
CREATININE: 1.13 mg/dL (ref 0.40–1.20)
Chloride: 100 mEq/L (ref 96–112)
GFR: 49.72 mL/min — ABNORMAL LOW (ref >60.00)
Glucose, Bld: 102 mg/dL — ABNORMAL HIGH (ref 70–99)
Potassium: 4.1 mEq/L (ref 3.5–5.1)
Sodium: 135 mEq/L (ref 135–145)
Total Protein: 6.2 g/dL (ref 6.0–8.3)

## 2016-10-29 LAB — CBC WITH DIFFERENTIAL/PLATELET
BASOS ABS: 0.1 10*3/uL (ref 0.0–0.1)
BASOS PCT: 1.2 % (ref 0.0–3.0)
EOS ABS: 0.2 10*3/uL (ref 0.0–0.7)
Eosinophils Relative: 3.3 % (ref 0.0–5.0)
HEMATOCRIT: 36.3 % (ref 36.0–46.0)
HEMOGLOBIN: 12.7 g/dL (ref 12.0–15.0)
LYMPHS PCT: 21.8 % (ref 12.0–46.0)
Lymphs Abs: 1.6 10*3/uL (ref 0.7–4.0)
MCHC: 35.1 g/dL (ref 30.0–36.0)
MCV: 90 fl (ref 78.0–100.0)
MONOS PCT: 8.2 % (ref 3.0–12.0)
Monocytes Absolute: 0.6 10*3/uL (ref 0.1–1.0)
Neutro Abs: 4.7 10*3/uL (ref 1.4–7.7)
Neutrophils Relative %: 65.5 % (ref 43.0–77.0)
Platelets: 329 10*3/uL (ref 150.0–400.0)
RBC: 4.03 Mil/uL (ref 3.87–5.11)
RDW: 12.6 % (ref 11.5–15.5)
WBC: 7.2 10*3/uL (ref 4.0–10.5)

## 2016-10-29 LAB — POC URINALSYSI DIPSTICK (AUTOMATED)
Bilirubin, UA: NEGATIVE
Blood, UA: NEGATIVE
Glucose, UA: NEGATIVE
Ketones, UA: NEGATIVE
NITRITE UA: NEGATIVE
PROTEIN UA: NEGATIVE
SPEC GRAV UA: 1.01 (ref 1.010–1.025)
UROBILINOGEN UA: 0.2 U/dL
pH, UA: 6.5 (ref 5.0–8.0)

## 2016-10-29 LAB — VITAMIN B12: Vitamin B-12: >1500 pg/mL — ABNORMAL HIGH (ref 211–911)

## 2016-10-30 ENCOUNTER — Telehealth: Payer: Self-pay | Admitting: Family Medicine

## 2016-10-30 MED ORDER — CEPHALEXIN 500 MG PO CAPS: 500.0000 mg | ORAL_CAPSULE | Freq: Three times a day (TID) | ORAL | 0 refills | Status: DC

## 2016-10-30 NOTE — Telephone Encounter (Signed)
° ° ° °  Pt call to say she is now having signs of UTI / kidney infection and is asking if something can be called in      [place]

## 2016-10-30 NOTE — Telephone Encounter (Signed)
Called patient and left voicemail about potential rash with keflex due to pcn allergy. She is to stop if she has reaction liek rash and seek care in urgent care.   Will need culture on Monday if symptoms persist.   Call back to check on her Monday please

## 2016-11-02 ENCOUNTER — Encounter: Payer: Self-pay | Admitting: Family Medicine

## 2016-11-02 ENCOUNTER — Ambulatory Visit (INDEPENDENT_AMBULATORY_CARE_PROVIDER_SITE_OTHER): Payer: Medicare HMO | Admitting: Family Medicine

## 2016-11-02 VITALS — BP 128/66 | HR 56 | Temp 98.1°F | Ht 63.0 in | Wt 156.0 lb

## 2016-11-02 DIAGNOSIS — N183 Chronic kidney disease, stage 3 unspecified: Secondary | ICD-10-CM

## 2016-11-02 DIAGNOSIS — Z0000 Encounter for general adult medical examination without abnormal findings: Secondary | ICD-10-CM

## 2016-11-02 DIAGNOSIS — E785 Hyperlipidemia, unspecified: Secondary | ICD-10-CM | POA: Diagnosis not present

## 2016-11-02 DIAGNOSIS — E538 Deficiency of other specified B group vitamins: Secondary | ICD-10-CM

## 2016-11-02 NOTE — Patient Instructions (Addendum)
Stop injections. Start oral b12 1000 mcg or 1 mg. Recheck 6 months  For UTI- return if symptoms do not improve or if new symptoms occur  ______________________________________________________________________  Starting October 1st 2018, I will be transferring to our new location: [address] [facility] (corner of [address] from [employer]) [place] Phone: [phone]  I would love to have you remain my patient at this new location as long as it remains convenient for you. I am excited about the opportunity to have x-ray and sports medicine in the new building but will really miss the awesome staff and physicians at [facility]. Continue to schedule appointments at [facility] and we will automatically transfer them to the [place] location starting October 1st.

## 2016-11-02 NOTE — Telephone Encounter (Signed)
Patient was seen in office today.  

## 2016-11-02 NOTE — Assessment & Plan Note (Signed)
HLD- reasonable control on niacin. red yeast rice in past. Controlled but discussed no decreased mortality risk.   Patient opts against statin given age near 51 and no CVA/MI.

## 2016-11-02 NOTE — Assessment & Plan Note (Signed)
CKD III- stable with GFR in 45-50 range

## 2016-11-02 NOTE — Assessment & Plan Note (Signed)
Low b12- appropriately treated with monthly injections. Discovered dueto swollen tongue. We opted to trial on oral b12 at this point 1000 mcg or 1mg 

## 2016-11-02 NOTE — Progress Notes (Signed)
Phone: [phone]  Subjective:  Patient presents today for their annual physical. Chief complaint-noted.   See problem oriented charting- ROS- full  review of systems was completed and negative including No chest pain or shortness of breath (other than rarely with asthma). No headache or blurry vision.   The following were reviewed and entered/updated in epic: Past Medical History:  Diagnosis Date  . ALLERGIC RHINITIS 01/31/2008  . Allergy   . Asthma   . Breast cancer ([place])   . Breast cancer of lower-inner quadrant of left female breast ([place]) 11/01/2015  . Cataract   . Colon cancer screening 05/21/2014   No polyps at age 75. Diverticulosis alone-no more colonoscopies.    Marland Kitchen DIVERTICULITIS, HX OF 09/07/2008   pt denies this   . Eosinophilia 02/08/2009  . Fibroid   . Heart murmur    slight per pt.   Marland Kitchen HYPERLIPIDEMIA 12/20/2006  . Hypertension   . Osteopenia   . OSTEOPOROSIS 12/20/2006  . Vitamin B 12 deficiency    Patient Active Problem List   Diagnosis Date Noted  . Breast cancer of lower-inner quadrant of left female breast ([place]) 11/01/2015    Priority: High  . Low vitamin B12 level 06/24/2016    Priority: Medium  . CKD (chronic kidney disease), stage III 10/22/2015    Priority: Medium  . Asthma, moderate persistent, well-controlled 04/19/2014    Priority: Medium  . Hypertension 11/25/2010    Priority: Medium  . Hyperlipidemia 12/20/2006    Priority: Medium  . Family history of abdominal aortic aneurysm 03/08/2015    Priority: Low  . Eosinophilia 02/08/2009    Priority: Low  . Allergic rhinitis 01/31/2008    Priority: Low  . Osteopenia 12/20/2006    Priority: Low   Past Surgical History:  Procedure Laterality Date  . BREAST LUMPECTOMY WITH NEEDLE LOCALIZATION Left 01/03/2016   Procedure: BREAST re-excision LUMPECTOMY WITH NEEDLE LOCALIZATION;  Surgeon: Rolm Bookbinder, MD;  Location: [facility];  Service: General;  Laterality: Left;  BREAST  re-excision LUMPECTOMY WITH NEEDLE LOCALIZATION  . BREAST LUMPECTOMY WITH RADIOACTIVE SEED LOCALIZATION Left 11/28/2015   Procedure: LEFT BREAST LUMPECTOMY WITH BRACKETED RADIOACTIVE SEED LOCALIZATION;  Surgeon: Rolm Bookbinder, MD;  Location: [facility];  Service: General;  Laterality: Left;  . BREAST SURGERY     cysts removed x2  . CATARACT EXTRACTION BILATERAL W/ ANTERIOR VITRECTOMY  2013   bilateral cataracts  . COLONOSCOPY  2005   tics only   . KNEE ARTHROSCOPY     left  . NEUROMA SURGERY     x2 feet  . thyroid duct cyst      Family History  Problem Relation Age of Onset  . COPD Father   . Heart disease Father        stent placed 68  . Brain cancer Mother   . Colon cancer Neg Hx   . Stomach cancer Neg Hx     Medications- reviewed and updated Current Outpatient Prescriptions  Medication Sig Dispense Refill  . Albuterol Sulfate (PROAIR RESPICLICK) 638 (90 BASE) MCG/ACT AEPB Inhale 1 puff into the lungs every 8 (eight) hours as needed. (Patient taking differently: Inhale 1 puff into the lungs every 8 (eight) hours as needed (for shortness of breath). ) 1 each 0  . cephALEXin (KEFLEX) 500 MG capsule Take 1 capsule (500 mg total) by mouth 3 (three) times daily. 21 capsule 0  . cholecalciferol (VITAMIN D) 1000 UNITS tablet Take 2,000 Units by mouth daily.    Marland Kitchen  fluticasone (FLONASE) 50 MCG/ACT nasal spray Place 1 spray into both nostrils daily as needed.     . fluticasone furoate-vilanterol (BREO ELLIPTA) 100-25 MCG/INH AEPB Inhale 1 puff into the lungs daily.    . hydrochlorothiazide (HYDRODIURIL) 25 MG tablet TAKE 1 TABLET (25 MG TOTAL) BY MOUTH DAILY. 90 tablet 1  . losartan (COZAAR) 100 MG tablet TAKE 1 TABLET (100 MG TOTAL) BY MOUTH DAILY. 90 tablet 2  . meclizine (ANTIVERT) 25 MG tablet Take 1 tablet (25 mg total) by mouth 3 (three) times daily as needed for dizziness. 30 tablet 0  . metoprolol succinate (TOPROL-XL) 25 MG 24 hr tablet TAKE 1 TABLET (25 MG  TOTAL) BY MOUTH DAILY. 90 tablet 2  . montelukast (SINGULAIR) 10 MG tablet Take 1 tablet by mouth at bedtime.    . niacin 500 MG tablet Take 500 mg by mouth at bedtime.    . NON FORMULARY Inject as directed once a week. Vitamin B12 Injection once weekly    . Omega-3 Fatty Acids (FISH OIL) 1000 MG CAPS Take 1,000 mg by mouth Nightly.    . tamoxifen (NOLVADEX) 20 MG tablet Take 1 tablet (20 mg total) by mouth daily. 90 tablet 3   No current facility-administered medications for this visit.     Allergies-reviewed and updated Allergies  Allergen Reactions  . Lisinopril Other (See Comments) and Cough    Dry cough and stinging rash  . Pork-Derived Products Other (See Comments)    Headache (can only tolerate about once a week, if that)  . Latex Rash  . Penicillins Rash    Has patient had a PCN reaction causing immediate rash, facial/tongue/throat swelling, SOB or lightheadedness with hypotension: Yes Has patient had a PCN reaction causing severe rash involving mucus membranes or skin necrosis: No Has patient had a PCN reaction that required hospitalization No Has patient had a PCN reaction occurring within the last 10 years: No If all of the above answers are "NO", then may proceed with Cephalosporin use.   . Sulfonamide Derivatives Rash  . Tape Rash    Band-aids break out skin!!    Social History   Social History  . Marital status: Married    Spouse name: N/A  . Number of children: N/A  . Years of education: N/A   Social History Main Topics  . Smoking status: Never Smoker  . Smokeless tobacco: Never Used  . Alcohol use No  . Drug use: No  . Sexual activity: Yes    Partners: Male    Birth control/ protection: Post-menopausal   Other Topics Concern  . None   Social History Narrative   Married. No kids- 2 step kids. 5 step grandkids.       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 Oropharynx normal Neck: no thyromegaly CV: regularly irregular, no murmurs rubs or gallops Lungs: CTAB no crackles, wheeze, rhonchi Abdomen: soft/nontender/nondistended/normal bowel sounds. No rebound or guarding.  Ext: no edema Skin: warm, dry Neuro: grossly normal, moves all extremities, PERRLA  Assessment/Plan:  76 y.o. female  presenting for annual physical.  Health Maintenance counseling: 1. Anticipatory guidance: Patient counseled regarding regular dental exams yearly, eye exams yearly, wearing seatbelts.  2. Risk factor reduction:  Advised patient of need for regular exercise and diet rich and fruits and vegetables to reduce risk of heart attack and stroke. Exercise- advised 150 minutes a week- walking 30 minutes daily. Diet-tries to do low salt. Discussed getting closer to 150 at least. .  Wt Readings from Last 3 Encounters:  11/02/16 156 lb (70.8 kg)  09/22/16 153 lb 9.6 oz (69.7 kg)  07/30/16 152 lb 12.8 oz (69.3 kg)  3. Immunizations/screenings/ancillary studies- wants to wait on shingrix until next year Immunization History  Administered Date(s) Administered  . Influenza Whole 03/02/2011  . Pneumococcal Conjugate-13 01/19/2007, 04/19/2014, 06/06/2015  . Pneumococcal Polysaccharide-23 01/19/2007  . Td 07/02/2002  . Tdap 08/22/2012  . Zoster 02/10/2010  4. Cervical cancer screening- aged out 62. Breast cancer screening-  breast exam with oncology and GYN and mammogram follow up per Dr. Lindi Adie 6. Colon cancer screening - 10/09/13 with no recall due to age 76. Skin cancer screening- Dr. Jerrell Belfast once a year  Status of chronic or acute concerns   Breast cancer history- s/p lumpectomy and radiation for DCIS. Follows  with concology Dr. Lindi Adie- follow up in September. States had to stop tamoxifen- incredible fatigue. Is to try back with 1/2 tablet.   Asthma- controlled on singulair for most part along with breo. Albuterol prn- not using rescue inhaler  HTN- at goal with hctz 25mg , losartan 100mg , metoprolol 25mg    Possible UTI- suprapubic pain with peeing last week. Treated with keflex over weekned- too late to get culture when we found out about her symptoms. She will return if symptoms do not improve or if new symptoms occur  quadrigeminy - has upcoming follow up with cardiology  Needs bone density next years physical   Low vitamin B12 level Low b12- appropriately treated with monthly injections. Discovered dueto swollen tongue. We opted to trial on oral b12 at this point 1000 mcg or 1mg   Hyperlipidemia HLD- reasonable control on niacin. red yeast rice in past. Controlled but discussed no decreased mortality risk.   Patient opts against statin given age near 76 and no CVA/MI.   CKD (chronic kidney disease), stage III CKD III- stable with GFR in 45-50 range  6 months  Return precautions advised.  Garret Reddish, MD

## 2016-11-03 DIAGNOSIS — D0512 Intraductal carcinoma in situ of left breast: Secondary | ICD-10-CM | POA: Diagnosis not present
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
## 2016-11-06 ENCOUNTER — Ambulatory Visit (INDEPENDENT_AMBULATORY_CARE_PROVIDER_SITE_OTHER): Payer: Medicare HMO

## 2016-11-06 VITALS — BP 150/60 | HR 62 | Ht 62.5 in | Wt 154.0 lb

## 2016-11-06 DIAGNOSIS — Z0000 Encounter for general adult medical examination without abnormal findings: Secondary | ICD-10-CM

## 2016-11-06 NOTE — Progress Notes (Signed)
I have reviewed and agree with note, evaluation, plan.   Stephen Hunter, MD  

## 2016-11-06 NOTE — Patient Instructions (Addendum)
[name] , Thank you for taking time to come for your Medicare Wellness Visit. I appreciate your ongoing commitment to your health goals. Please review the following plan we discussed and let me know if I can assist you in the future.   Recommendations for Dexa Scan  Calcium 1261m with Vit D 800u per day; more as directed by physician Strength building exercises discussed; can include walking; housework; small weights or stretch bands; silver sneakers if access to the Y  Shingrix is a vaccine for the prevention of Shingles in Adults 50 and older.  If you are on Medicare, you can request a prescription from your doctor to be filled at a pharmacy.  Please check with your benefits regarding applicable copays or out of pocket expenses.  The Shingrix is given in 2 vaccines approx 8 weeks apart. You must receive the 2nd dose prior to 6 months from receipt of the first.      These are the goals we discussed: Goals    . Exercise 150 minutes per week (moderate activity)          Stay in your garden;  Try your treadmill at home  Will [place]; x 3 per week; in addition to garden work        This is a list of the screening recommended for you and due dates:  Health Maintenance  Topic Date Due  . Flu Shot  04/19/2034*  . Mammogram  10/06/2017  . Tetanus Vaccine  08/23/2022  . DEXA scan (bone density measurement)  Completed  . Pneumonia vaccines  Completed  *Topic was postponed. The date shown is not the original due date.    Health Maintenance, Female Adopting a healthy lifestyle and getting preventive care can go a long way to promote health and wellness. Talk with your health care provider about what schedule of regular examinations is right for you. This is a good chance for you to check in with your provider about disease prevention and staying healthy. In between checkups, there are plenty of things you can do on your own. Experts have done a lot of research about which  lifestyle changes and preventive measures are most likely to keep you healthy. Ask your health care provider for more information. Weight and diet Eat a healthy diet  Be sure to include plenty of vegetables, fruits, low-fat dairy products, and lean protein.  Do not eat a lot of foods high in solid fats, added sugars, or salt.  Get regular exercise. This is one of the most important things you can do for your health. ? Most adults should exercise for at least 150 minutes each week. The exercise should increase your heart rate and make you sweat (moderate-intensity exercise). ? Most adults should also do strengthening exercises at least twice a week. This is in addition to the moderate-intensity exercise.  Maintain a healthy weight  Body mass index (BMI) is a measurement that can be used to identify possible weight problems. It estimates body fat based on height and weight. Your health care provider can help determine your BMI and help you achieve or maintain a healthy weight.  For females 254years of age and older: ? A BMI below 18.5 is considered underweight. ? A BMI of 18.5 to 24.9 is normal. ? A BMI of 25 to 29.9 is considered overweight. ? A BMI of 30 and above is considered obese.  Watch levels of cholesterol and blood lipids  You should start  having your blood tested for lipids and cholesterol at 76 years of age, then have this test every 5 years.  You may need to have your cholesterol levels checked more often if: ? Your lipid or cholesterol levels are high. ? You are older than 76 years of age. ? You are at high risk for heart disease.  Cancer screening Lung Cancer  Lung cancer screening is recommended for adults 77-57 years old who are at high risk for lung cancer because of a history of smoking.  A yearly low-dose CT scan of the lungs is recommended for people who: ? Currently smoke. ? Have quit within the past 15 years. ? Have at least a 30-pack-year history of  smoking. A pack year is smoking an average of one pack of cigarettes a day for 1 year.  Yearly screening should continue until it has been 15 years since you quit.  Yearly screening should stop if you develop a health problem that would prevent you from having lung cancer treatment.  Breast Cancer  Practice breast self-awareness. This means understanding how your breasts normally appear and feel.  It also means doing regular breast self-exams. Let your health care provider know about any changes, no matter how small.  If you are in your 20s or 30s, you should have a clinical breast exam (CBE) by a health care provider every 1-3 years as part of a regular health exam.  If you are 57 or older, have a CBE every year. Also consider having a breast X-ray (mammogram) every year.  If you have a family history of breast cancer, talk to your health care provider about genetic screening.  If you are at high risk for breast cancer, talk to your health care provider about having an MRI and a mammogram every year.  Breast cancer gene (BRCA) assessment is recommended for women who have family members with BRCA-related cancers. BRCA-related cancers include: ? Breast. ? Ovarian. ? Tubal. ? Peritoneal cancers.  Results of the assessment will determine the need for genetic counseling and BRCA1 and BRCA2 testing.  Cervical Cancer Your health care provider may recommend that you be screened regularly for cancer of the pelvic organs (ovaries, uterus, and vagina). This screening involves a pelvic examination, including checking for microscopic changes to the surface of your cervix (Pap test). You may be encouraged to have this screening done every 3 years, beginning at age 49.  For women ages 79-65, health care providers may recommend pelvic exams and Pap testing every 3 years, or they may recommend the Pap and pelvic exam, combined with testing for human papilloma virus (HPV), every 5 years. Some types of  HPV increase your risk of cervical cancer. Testing for HPV may also be done on women of any age with unclear Pap test results.  Other health care providers may not recommend any screening for nonpregnant women who are considered low risk for pelvic cancer and who do not have symptoms. Ask your health care provider if a screening pelvic exam is right for you.  If you have had past treatment for cervical cancer or a condition that could lead to cancer, you need Pap tests and screening for cancer for at least 20 years after your treatment. If Pap tests have been discontinued, your risk factors (such as having a new sexual partner) need to be reassessed to determine if screening should resume. Some women have medical problems that increase the chance of getting cervical cancer. In these cases, your health  care provider may recommend more frequent screening and Pap tests.  Colorectal Cancer  This type of cancer can be detected and often prevented.  Routine colorectal cancer screening usually begins at 76 years of age and continues through 76 years of age.  Your health care provider may recommend screening at an earlier age if you have risk factors for colon cancer.  Your health care provider may also recommend using home test kits to check for hidden blood in the stool.  A small camera at the end of a tube can be used to examine your colon directly (sigmoidoscopy or colonoscopy). This is done to check for the earliest forms of colorectal cancer.  Routine screening usually begins at age 65.  Direct examination of the colon should be repeated every 5-10 years through 76 years of age. However, you may need to be screened more often if early forms of precancerous polyps or small growths are found.  Skin Cancer  Check your skin from head to toe regularly.  Tell your health care provider about any new moles or changes in moles, especially if there is a change in a mole's shape or color.  Also tell  your health care provider if you have a mole that is larger than the size of a pencil eraser.  Always use sunscreen. Apply sunscreen liberally and repeatedly throughout the day.  Protect yourself by wearing long sleeves, pants, a wide-brimmed hat, and sunglasses whenever you are outside.  Heart disease, diabetes, and high blood pressure  High blood pressure causes heart disease and increases the risk of stroke. High blood pressure is more likely to develop in: ? People who have blood pressure in the high end of the normal range (130-139/85-89 mm Hg). ? People who are overweight or obese. ? People who are African American.  If you are 68-22 years of age, have your blood pressure checked every 3-5 years. If you are 33 years of age or older, have your blood pressure checked every year. You should have your blood pressure measured twice-once when you are at a hospital or clinic, and once when you are not at a hospital or clinic. Record the average of the two measurements. To check your blood pressure when you are not at a hospital or clinic, you can use: ? An automated blood pressure machine at a pharmacy. ? A home blood pressure monitor.  If you are between 60 years and 2 years old, ask your health care provider if you should take aspirin to prevent strokes.  Have regular diabetes screenings. This involves taking a blood sample to check your fasting blood sugar level. ? If you are at a normal weight and have a low risk for diabetes, have this test once every three years after 76 years of age. ? If you are overweight and have a high risk for diabetes, consider being tested at a younger age or more often. Preventing infection Hepatitis B  If you have a higher risk for hepatitis B, you should be screened for this virus. You are considered at high risk for hepatitis B if: ? You were born in a country where hepatitis B is common. Ask your health care provider which countries are considered high  risk. ? Your parents were born in a high-risk country, and you have not been immunized against hepatitis B (hepatitis B vaccine). ? You have HIV or AIDS. ? You use needles to inject street drugs. ? You live with someone who has hepatitis B. ?  You have had sex with someone who has hepatitis B. ? You get hemodialysis treatment. ? You take certain medicines for conditions, including cancer, organ transplantation, and autoimmune conditions.  Hepatitis C  Blood testing is recommended for: ? Everyone born from [birth year] through 1965. ? Anyone with known risk factors for hepatitis C.  Sexually transmitted infections (STIs)  You should be screened for sexually transmitted infections (STIs) including gonorrhea and chlamydia if: ? You are sexually active and are younger than 76 years of age. ? You are older than 76 years of age and your health care provider tells you that you are at risk for this type of infection. ? Your sexual activity has changed since you were last screened and you are at an increased risk for chlamydia or gonorrhea. Ask your health care provider if you are at risk.  If you do not have HIV, but are at risk, it may be recommended that you take a prescription medicine daily to prevent HIV infection. This is called pre-exposure prophylaxis (PrEP). You are considered at risk if: ? You are sexually active and do not regularly use condoms or know the HIV status of your partner(s). ? You take drugs by injection. ? You are sexually active with a partner who has HIV.  Talk with your health care provider about whether you are at high risk of being infected with HIV. If you choose to begin PrEP, you should first be tested for HIV. You should then be tested every 3 months for as long as you are taking PrEP. Pregnancy  If you are premenopausal and you may become pregnant, ask your health care provider about preconception counseling.  If you may become pregnant, take 400 to 800 micrograms  (mcg) of folic acid every day.  If you want to prevent pregnancy, talk to your health care provider about birth control (contraception). Osteoporosis and menopause  Osteoporosis is a disease in which the bones lose minerals and strength with aging. This can result in serious bone fractures. Your risk for osteoporosis can be identified using a bone density scan.  If you are 81 years of age or older, or if you are at risk for osteoporosis and fractures, ask your health care provider if you should be screened.  Ask your health care provider whether you should take a calcium or vitamin D supplement to lower your risk for osteoporosis.  Menopause may have certain physical symptoms and risks.  Hormone replacement therapy may reduce some of these symptoms and risks. Talk to your health care provider about whether hormone replacement therapy is right for you. Follow these instructions at home:  Schedule regular health, dental, and eye exams.  Stay current with your immunizations.  Do not use any tobacco products including cigarettes, chewing tobacco, or electronic cigarettes.  If you are pregnant, do not drink alcohol.  If you are breastfeeding, limit how much and how often you drink alcohol.  Limit alcohol intake to no more than 1 drink per day for nonpregnant women. One drink equals 12 ounces of beer, 5 ounces of wine, or 1 ounces of hard liquor.  Do not use street drugs.  Do not share needles.  Ask your health care provider for help if you need support or information about quitting drugs.  Tell your health care provider if you often feel depressed.  Tell your health care provider if you have ever been abused or do not feel safe at home. This information is not intended to replace advice  given to you by your health care provider. Make sure you discuss any questions you have with your health care provider. Document Released: 12/01/2010 Document Revised: 10/24/2015 Document Reviewed:  02/19/2015 Elsevier Interactive Patient Education  2018 [employer].    Fall Prevention in the Home Falls can cause injuries and can affect peo ple from all age groups. There are many simple things that you can do to make your home safe and to help prevent falls. What can I do on the outside of my home?  Regularly repair the edges of walkways and driveways and fix any cracks.  Remove high doorway thresholds.  Trim any shrubbery on the main path into your home.  Use bright outdoor lighting.  Clear walkways of debris and clutter, including tools and rocks.  Regularly check that handrails are securely fastened and in good repair. Both sides of any steps should have handrails.  Install guardrails along the edges of any raised decks or porches.  Have leaves, snow, and ice cleared regularly.  Use sand or salt on walkways during winter months.  In the garage, clean up any spills right away, including grease or oil spills. What can I do in the bathroom?  Use night lights.  Install grab bars by the toilet and in the tub and shower. Do not use towel bars as grab bars.  Use non-skid mats or decals on the floor of the tub or shower.  If you need to sit down while you are in the shower, use a plastic, non-slip stool.  Keep the floor dry. Immediately clean up any water that spills on the floor.  Remove soap buildup in the tub or shower on a regular basis.  Attach bath mats securely with double-sided non-slip rug tape.  Remove throw rugs and other tripping hazards from the floor. What can I do in the bedroom?  Use night lights.  Make sure that a bedside light is easy to reach.  Do not use oversized bedding that drapes onto the floor.  Have a firm chair that has side arms to use for getting dressed.  Remove throw rugs and other tripping hazards from the floor. What can I do in the kitchen?  Clean up any spills right away.  Avoid walking on wet floors.  Place frequently  used items in easy-to-reach places.  If you need to reach for something above you, use a sturdy step stool that has a grab bar.  Keep electrical cables out of the way.  Do not use floor polish or wax that makes floors slippery. If you have to use wax, make sure that it is non-skid floor wax.  Remove throw rugs and other tripping hazards from the floor. What can I do in the stairways?  Do not leave any items on the stairs.  Make sure that there are handrails on both sides of the stairs. Fix handrails that are broken or loose. Make sure that handrails are as long as the stairways.  Check any carpeting to make sure that it is firmly attached to the stairs. Fix any carpet that is loose or worn.  Avoid having throw rugs at the top or bottom of stairways, or secure the rugs with carpet tape to prevent them from moving.  Make sure that you have a light switch at the top of the stairs and the bottom of the stairs. If you do not have them, have them installed. What are some other fall prevention tips?  Wear closed-toe shoes that fit  well and support your feet. Wear shoes that have rubber soles or low heels.  When you use a stepladder, make sure that it is completely opened and that the sides are firmly locked. Have someone hold the ladder while you are using it. Do not climb a closed stepladder.  Add color or contrast paint or tape to grab bars and handrails in your home. Place contrasting color strips on the first and last steps.  Use mobility aids as needed, such as canes, walkers, scooters, and crutches.  Turn on lights if it is dark. Replace any light bulbs that burn out.  Set up furniture so that there are clear paths. Keep the furniture in the same spot.  Fix any uneven floor surfaces.  Choose a carpet design that does not hide the edge of steps of a stairway.  Be aware of any and all pets.  Review your medicines with your healthcare provider. Some medicines can cause dizziness  or changes in blood pressure, which increase your risk of falling. Talk with your health care provider about other ways that you can decrease your risk of falls. This may include working with a physical therapist or trainer to improve your strength, balance, and endurance. This information is not intended to replace advice given to you by your health care provider. Make sure you discuss any questions you have with your health care provider. Document Released: 05/08/2002 Document Revised: 10/15/2015 Document Reviewed: 06/22/2014 Elsevier Interactive Patient Education  2017 [employer].

## 2016-11-09 ENCOUNTER — Telehealth: Payer: Self-pay | Admitting: Family Medicine

## 2016-11-09 NOTE — Telephone Encounter (Signed)
Pt is calling to let susan know the name of gel is radia plex she used after radiation treatments

## 2016-11-10 ENCOUNTER — Other Ambulatory Visit: Payer: Medicare HMO

## 2016-11-10 ENCOUNTER — Telehealth: Payer: Self-pay | Admitting: Family Medicine

## 2016-11-10 ENCOUNTER — Other Ambulatory Visit: Payer: Self-pay

## 2016-11-10 DIAGNOSIS — R350 Frequency of micturition: Secondary | ICD-10-CM

## 2016-11-10 NOTE — Telephone Encounter (Signed)
Can she come by to drop off a urine for urinalysis and urine culture- jamie can you order and have her come by?

## 2016-11-10 NOTE — Telephone Encounter (Signed)
Spoke to patient who is going to come by and drop off the specimen. I am entering in the orders

## 2016-11-10 NOTE — Telephone Encounter (Signed)
Pt called back and advised of Dr Yong Channel message. Can you put the order in? Thanks! Pt on the schedule. wil be here 20  minutes

## 2016-11-10 NOTE — Telephone Encounter (Signed)
° ° °  Pt said she think her kidney infection is back and is asking for another round of antibio

## 2016-11-11 LAB — URINE CULTURE: ORGANISM ID, BACTERIA: NO GROWTH

## 2016-11-12 ENCOUNTER — Encounter: Payer: Self-pay | Admitting: Family Medicine

## 2016-11-12 ENCOUNTER — Ambulatory Visit (INDEPENDENT_AMBULATORY_CARE_PROVIDER_SITE_OTHER): Payer: Medicare HMO | Admitting: Family Medicine

## 2016-11-12 VITALS — BP 118/58 | HR 80 | Temp 98.4°F | Ht 62.5 in | Wt 154.2 lb

## 2016-11-12 DIAGNOSIS — R1032 Left lower quadrant pain: Secondary | ICD-10-CM

## 2016-11-12 LAB — POC URINALSYSI DIPSTICK (AUTOMATED)
BILIRUBIN UA: NEGATIVE
Blood, UA: NEGATIVE
GLUCOSE UA: NEGATIVE
Ketones, UA: NEGATIVE
LEUKOCYTES UA: NEGATIVE
NITRITE UA: NEGATIVE
PH UA: 6 (ref 5.0–8.0)
Protein, UA: NEGATIVE
Spec Grav, UA: 1.015 (ref 1.010–1.025)
UROBILINOGEN UA: 0.2 U/dL

## 2016-11-12 MED ORDER — AMOXICILLIN-POT CLAVULANATE 875-125 MG PO TABS: 1.0000 | ORAL_TABLET | Freq: Two times a day (BID) | ORAL | 0 refills | Status: DC

## 2016-11-12 NOTE — Progress Notes (Signed)
Subjective:  Lisa Greene is a 76 y.o. year old very pleasant female patient who presents for/with See problem oriented charting ROS- no fever chills, vomiting. Does complain of LLQ pain. Admits to daily bowel movements with rather soft stool.    Past Medical History-  Patient Active Problem List   Diagnosis Date Noted  . Breast cancer of lower-inner quadrant of left female breast ([place]) 11/01/2015    Priority: High  . Low vitamin B12 level 06/24/2016    Priority: Medium  . CKD (chronic kidney disease), stage III 10/22/2015    Priority: Medium  . Asthma, moderate persistent, well-controlled 04/19/2014    Priority: Medium  . Hypertension 11/25/2010    Priority: Medium  . Hyperlipidemia 12/20/2006    Priority: Medium  . Family history of abdominal aortic aneurysm 03/08/2015    Priority: Low  . Eosinophilia 02/08/2009    Priority: Low  . Allergic rhinitis 01/31/2008    Priority: Low  . Osteopenia 12/20/2006    Priority: Low    Medications- reviewed and updated Current Outpatient Prescriptions  Medication Sig Dispense Refill  . Albuterol Sulfate (PROAIR RESPICLICK) 417 (90 BASE) MCG/ACT AEPB Inhale 1 puff into the lungs every 8 (eight) hours as needed. (Patient taking differently: Inhale 1 puff into the lungs every 8 (eight) hours as needed (for shortness of breath). ) 1 each 0  . cholecalciferol (VITAMIN D) 1000 UNITS tablet Take 2,000 Units by mouth daily.    . fluticasone (FLONASE) 50 MCG/ACT nasal spray Place 1 spray into both nostrils daily as needed.     . fluticasone furoate-vilanterol (BREO ELLIPTA) 100-25 MCG/INH AEPB Inhale 1 puff into the lungs daily.    . hydrochlorothiazide (HYDRODIURIL) 25 MG tablet TAKE 1 TABLET (25 MG TOTAL) BY MOUTH DAILY. 90 tablet 1  . losartan (COZAAR) 100 MG tablet TAKE 1 TABLET (100 MG TOTAL) BY MOUTH DAILY. 90 tablet 2  . meclizine (ANTIVERT) 25 MG tablet Take 1 tablet (25 mg total) by mouth 3 (three) times daily as needed for dizziness. 30  tablet 0  . metoprolol succinate (TOPROL-XL) 25 MG 24 hr tablet TAKE 1 TABLET (25 MG TOTAL) BY MOUTH DAILY. 90 tablet 2  . montelukast (SINGULAIR) 10 MG tablet Take 1 tablet by mouth at bedtime.    . niacin 500 MG tablet Take 500 mg by mouth at bedtime.    . NON FORMULARY Inject as directed once a week. Vitamin B12 Injection once weekly    . Omega-3 Fatty Acids (FISH OIL) 1000 MG CAPS Take 1,000 mg by mouth Nightly.    Marland Kitchen amoxicillin-clavulanate (AUGMENTIN) 875-125 MG tablet Take 1 tablet by mouth 2 (two) times daily. 14 tablet 0  . tamoxifen (NOLVADEX) 20 MG tablet Take 1 tablet (20 mg total) by mouth daily. (Patient not taking: Reported on 11/12/2016) 90 tablet 3   No current facility-administered medications for this visit.     Objective: BP (!) 118/58 (BP Location: Right Arm, Patient Position: Sitting, Cuff Size: Large)   Pulse 80   Temp 98.4 F (36.9 C) (Oral)   Ht 5' 2.5" (1.588 m)   Wt 154 lb 3.2 oz (69.9 kg)   SpO2 97%   BMI 27.75 kg/m  Gen: NAD, resting comfortably CV: RRR no murmurs rubs or gallops Lungs: CTAB no crackles, wheeze, rhonchi Abdomen: patient diffusely mildly tender but worse in LLQ, RLQ and slightly more than rest of abdomen in epigastric area/nondistended/normal bowel sounds. No rebound or guarding.  Ext: no edema Skin: warm, dry,  no rash over abdomen  Assessment/Plan:  LLQ abdominal pain - Plan: POCT Urinalysis Dipstick (Automated), CBC with Differential/Platelet, Basic metabolic panel S: for about a week- Morning and night has some pain in LLQ radiating to low back. Pain up to 6-7/10 usually at night, now and in daytime usually down to 2-3/10 and can ignore. No blood in urine. No blood in stool. No fever. Mild nausea. No vomiting. No loose stools. Thought possible UTI as cause but urine culture showed no growht.  A/P: 76 year old female with LLQ pain radiating to her back. On exam she seems to be more diffusely tender but most pain in LLQ and RLQ but no  suprapubic pain. I wonder about a process more like constipation. She is worried about diverticulitis and will be out of town for the next several days. Notes 30 years ago rash to pencillin only. I agreed to let her have augmentin on hand for mild diverticulitis if she has worsening symptoms. She declined going for x-ray today in case possible kidney stones or waiting for CT tomorrow as she needs to go out of town.  Patient Instructions  Please stop by lab before you go  I dont like you leaving without knowing what this is and how it is going to do over next few days  I do want you to take a capful of miralax daily for next 5 days and hold for watery stool. I wonder if constipation is contributing.   If you have fever or worsening pain in left lower quadrant- can take augmentin which is a treatment for mild diverticulitis. If had worsening pain with that would need to seek care to consider more aggressive cipro/flagyl combination  PRN for acute issue  Orders Placed This Encounter  Procedures  . CBC with Differential/Platelet  . Basic metabolic panel    [place]  . POCT Urinalysis Dipstick (Automated)    Meds ordered this encounter  Medications  . amoxicillin-clavulanate (AUGMENTIN) 875-125 MG tablet    Sig: Take 1 tablet by mouth 2 (two) times daily.    Dispense:  14 tablet    Refill:  0    Return precautions advised.  Garret Reddish, MD

## 2016-11-12 NOTE — Patient Instructions (Addendum)
Please stop by lab before you go  I dont like you leaving without knowing what this is and how it is going to do over next few days  I do want you to take a capful of miralax daily for next 5 days and hold for watery stool. I wonder if constipation is contributing.   If you have fever or worsening pain in left lower quadrant- can take augmentin which is a treatment for mild diverticulitis. If had worsening pain with that would need to seek care to consider more aggressive cipro/flagyl combination

## 2016-11-13 ENCOUNTER — Ambulatory Visit: Payer: Medicare HMO | Admitting: Interventional Cardiology

## 2016-11-13 LAB — BASIC METABOLIC PANEL
BUN: 15 mg/dL (ref 6–23)
CALCIUM: 9.3 mg/dL (ref 8.4–10.5)
CO2: 25 meq/L (ref 19–32)
CREATININE: 1.21 mg/dL — AB (ref 0.40–1.20)
Chloride: 95 mEq/L — ABNORMAL LOW (ref 96–112)
GFR: 45.94 mL/min — ABNORMAL LOW (ref >60.00)
GLUCOSE: 87 mg/dL (ref 70–99)
Potassium: 4.1 mEq/L (ref 3.5–5.1)
Sodium: 128 mEq/L — ABNORMAL LOW (ref 135–145)

## 2016-11-13 LAB — CBC WITH DIFFERENTIAL/PLATELET
BASOS PCT: 1.5 % (ref 0.0–3.0)
Basophils Absolute: 0.1 10*3/uL (ref 0.0–0.1)
EOS PCT: 1.5 % (ref 0.0–5.0)
Eosinophils Absolute: 0.1 10*3/uL (ref 0.0–0.7)
HCT: 34.9 % — ABNORMAL LOW (ref 36.0–46.0)
HEMOGLOBIN: 12.3 g/dL (ref 12.0–15.0)
LYMPHS ABS: 1.5 10*3/uL (ref 0.7–4.0)
Lymphocytes Relative: 18.7 % (ref 12.0–46.0)
MCHC: 35.1 g/dL (ref 30.0–36.0)
MCV: 90 fl (ref 78.0–100.0)
MONOS PCT: 9.1 % (ref 3.0–12.0)
Monocytes Absolute: 0.7 10*3/uL (ref 0.1–1.0)
NEUTROS PCT: 69.2 % (ref 43.0–77.0)
Neutro Abs: 5.5 10*3/uL (ref 1.4–7.7)
Platelets: 297 10*3/uL (ref 150.0–400.0)
RBC: 3.88 Mil/uL (ref 3.87–5.11)
RDW: 12.5 % (ref 11.5–15.5)
WBC: 8 10*3/uL (ref 4.0–10.5)

## 2016-11-23 DIAGNOSIS — I700 Atherosclerosis of aorta: Secondary | ICD-10-CM | POA: Insufficient documentation

## 2016-11-25 ENCOUNTER — Encounter: Payer: Self-pay | Admitting: Interventional Cardiology

## 2016-11-25 ENCOUNTER — Ambulatory Visit (INDEPENDENT_AMBULATORY_CARE_PROVIDER_SITE_OTHER): Payer: Medicare HMO | Admitting: Interventional Cardiology

## 2016-11-25 VITALS — BP 156/78 | HR 71 | Ht 62.0 in | Wt 154.4 lb

## 2016-11-25 DIAGNOSIS — I10 Essential (primary) hypertension: Secondary | ICD-10-CM | POA: Diagnosis not present

## 2016-11-25 DIAGNOSIS — I493 Ventricular premature depolarization: Secondary | ICD-10-CM

## 2016-11-25 DIAGNOSIS — N183 Chronic kidney disease, stage 3 unspecified: Secondary | ICD-10-CM

## 2016-11-25 DIAGNOSIS — Z8249 Family history of ischemic heart disease and other diseases of the circulatory system: Secondary | ICD-10-CM

## 2016-11-25 DIAGNOSIS — R002 Palpitations: Secondary | ICD-10-CM

## 2016-11-25 DIAGNOSIS — I700 Atherosclerosis of aorta: Secondary | ICD-10-CM | POA: Diagnosis not present

## 2016-11-25 DIAGNOSIS — E785 Hyperlipidemia, unspecified: Secondary | ICD-10-CM | POA: Diagnosis not present

## 2016-11-25 MED ORDER — ASPIRIN EC 81 MG PO TBEC: 81.0000 mg | DELAYED_RELEASE_TABLET | Freq: Every day | ORAL | 3 refills | Status: DC

## 2016-11-25 NOTE — Patient Instructions (Addendum)
Medication Instructions:  1) START Aspirin 81mg  once daily  Labwork: None  Testing/Procedures: Your physician has requested that you have an echocardiogram. Echocardiography is a painless test that uses sound waves to create images of your heart. It provides your doctor with information about the size and shape of your heart and how well your heart's chambers and valves are working. This procedure takes approximately one hour. There are no restrictions for this procedure.  Your physician has recommended that you wear a 48 hour holter monitor. Holter monitors are medical devices that record the heart's electrical activity. Doctors most often use these monitors to diagnose arrhythmias. Arrhythmias are problems with the speed or rhythm of the heartbeat. The monitor is a small, portable device. You can wear one while you do your normal daily activities. This is usually used to diagnose what is causing palpitations/syncope (passing out).    Follow-Up: Your physician recommends that you schedule a follow-up appointment in: 1 month with Dr. Tamala Julian.    Any Other Special Instructions Will Be Listed Below (If Applicable).     If you need a refill on your cardiac medications before your next appointment, please call your pharmacy.

## 2016-11-25 NOTE — Progress Notes (Signed)
Cardiology Office Note    Date:  11/25/2016   ID:  [name], [name] [birth year], MRN [id number]  PCP:  Marin Olp, MD  Cardiologist: Sinclair Grooms, MD   Chief Complaint  Patient presents with  . Palpitations  . Fatigue    History of Present Illness:  Lisa Greene is a 76 y.o. female who is the wife of Lisa Greene also a patient with diffuse coronary artery disease, is referred by Dr. Brayton Mars. Hunter for evaluation of palpitations/ PVC's.  She had breast cancer in 9 months ago. She was started on Novaldex and 2 months ago began having severe fatigue. She had difficulty walking and participating in typical activities without feeling extremely weak anti-. There is occasional shortness of breath. She then started lifting up her medications and felt that the tamoxifen was causing her symptoms. She discontinued the medication about a month ago and now states that she feels stronger. She was seen by her primary physician in April and was found to have irregular heartbeat that was asymptomatic. EKG demonstrated uniform PVCs. She denies orthopnea, PND, ankle edema, syncope, and chest pain.  Recent CT of the abdomen demonstrated severe atherosclerosis with calcification of the abdominal aorta.  Past Medical History:  Diagnosis Date  . ALLERGIC RHINITIS 01/31/2008  . Allergy   . Asthma   . Breast cancer ([place])   . Breast cancer of lower-inner quadrant of left female breast ([place]) 11/01/2015  . Cataract   . Colon cancer screening 05/21/2014   No polyps at age 22. Diverticulosis alone-no more colonoscopies.    Marland Kitchen DIVERTICULITIS, HX OF 09/07/2008   pt denies this   . Eosinophilia 02/08/2009  . Fibroid   . Heart murmur    slight per pt.   Marland Kitchen HYPERLIPIDEMIA 12/20/2006  . Hypertension   . Osteopenia   . OSTEOPOROSIS 12/20/2006  . Vitamin B 12 deficiency     Past Surgical History:  Procedure Laterality Date  . BREAST LUMPECTOMY WITH NEEDLE LOCALIZATION Left 01/03/2016   Procedure:  BREAST re-excision LUMPECTOMY WITH NEEDLE LOCALIZATION;  Surgeon: Rolm Bookbinder, MD;  Location: [facility];  Service: General;  Laterality: Left;  BREAST re-excision LUMPECTOMY WITH NEEDLE LOCALIZATION  . BREAST LUMPECTOMY WITH RADIOACTIVE SEED LOCALIZATION Left 11/28/2015   Procedure: LEFT BREAST LUMPECTOMY WITH BRACKETED RADIOACTIVE SEED LOCALIZATION;  Surgeon: Rolm Bookbinder, MD;  Location: [facility];  Service: General;  Laterality: Left;  . BREAST SURGERY     cysts removed x2  . CATARACT EXTRACTION BILATERAL W/ ANTERIOR VITRECTOMY  2013   bilateral cataracts  . COLONOSCOPY  2005   tics only   . KNEE ARTHROSCOPY     left  . NEUROMA SURGERY     x2 feet  . thyroid duct cyst      Current Medications: Outpatient Medications Prior to Visit  Medication Sig Dispense Refill  . Albuterol Sulfate (PROAIR RESPICLICK) 093 (90 BASE) MCG/ACT AEPB Inhale 1 puff into the lungs every 8 (eight) hours as needed. (Patient taking differently: Inhale 1 puff into the lungs every 8 (eight) hours as needed (for shortness of breath). ) 1 each 0  . cholecalciferol (VITAMIN D) 1000 UNITS tablet Take 2,000 Units by mouth daily.    . fluticasone (FLONASE) 50 MCG/ACT nasal spray Place 1 spray into both nostrils daily as needed.     . fluticasone furoate-vilanterol (BREO ELLIPTA) 100-25 MCG/INH AEPB Inhale 1 puff into the lungs daily.    . hydrochlorothiazide (HYDRODIURIL)  25 MG tablet TAKE 1 TABLET (25 MG TOTAL) BY MOUTH DAILY. 90 tablet 1  . losartan (COZAAR) 100 MG tablet TAKE 1 TABLET (100 MG TOTAL) BY MOUTH DAILY. 90 tablet 2  . meclizine (ANTIVERT) 25 MG tablet Take 1 tablet (25 mg total) by mouth 3 (three) times daily as needed for dizziness. 30 tablet 0  . metoprolol succinate (TOPROL-XL) 25 MG 24 hr tablet TAKE 1 TABLET (25 MG TOTAL) BY MOUTH DAILY. 90 tablet 2  . montelukast (SINGULAIR) 10 MG tablet Take 1 tablet by mouth at bedtime.    . niacin 500 MG tablet Take 500 mg  by mouth at bedtime.    . NON FORMULARY Inject as directed once a week. Vitamin B12 Injection once weekly    . Omega-3 Fatty Acids (FISH OIL) 1000 MG CAPS Take 1,000 mg by mouth Nightly.    . tamoxifen (NOLVADEX) 20 MG tablet Take 1 tablet (20 mg total) by mouth daily. (Patient not taking: Reported on 11/12/2016) 90 tablet 3  . amoxicillin-clavulanate (AUGMENTIN) 875-125 MG tablet Take 1 tablet by mouth 2 (two) times daily. (Patient not taking: Reported on 11/25/2016) 14 tablet 0   No facility-administered medications prior to visit.      Allergies:   Lisinopril; Pork-derived products; Latex; Penicillins; Sulfonamide derivatives; and Tape   Social History   Social History  . Marital status: Married    Spouse name: N/A  . Number of children: N/A  . Years of education: N/A   Social History Main Topics  . Smoking status: Never Smoker  . Smokeless tobacco: Never Used  . Alcohol use No  . Drug use: No  . Sexual activity: Yes    Partners: Male    Birth control/ protection: Post-menopausal   Other Topics Concern  . None   Social History Narrative   Married. No kids- 2 step kids. 5 step grandkids.       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   ROS:   Please see the history of present illness.    New diagnosis of breast cancer. Status post radiation therapy and ER positivity has led to tamoxifen therapy was the patient has discontinued because she felt it was causing fatigue.  All other systems reviewed and are negative.   PHYSICAL EXAM:   VS:  BP (!) 156/78 (BP Location: Right Arm)   Pulse 71   Ht 5\' 2"  (1.575 m)   Wt 154 lb 6.4 oz (70 kg)   BMI 28.24 kg/m    GEN: Well nourished, well developed, in no acute distress  HEENT: normal  Neck: no JVD, carotid bruits, or  masses Cardiac: IRR; no murmurs, rubs, or gallops,no edema  Respiratory:  clear to auscultation bilaterally, normal work of breathing GI: soft, nontender, nondistended, + BS MS: no deformity or atrophy  Skin: warm and dry, no rash Neuro:  Alert and Oriented x 3, Strength and sensation are intact Psych: euthymic mood, full affect  Wt Readings from Last 3 Encounters:  11/25/16 154 lb 6.4 oz (70 kg)  11/12/16 154 lb 3.2 oz (69.9 kg)  11/06/16 154 lb (69.9 kg)      Studies/Labs Reviewed:   EKG:  EKG  From April 2018 revealed uniform PVCs, biatrial abnormality, and otherwise unremarkable  in appearance.  Recent Labs: 09/22/2016: TSH 3.47 10/29/2016: ALT 12 11/12/2016: BUN 15; Creatinine, Ser 1.21; Hemoglobin 12.3; Platelets 297.0; Potassium 4.1; Sodium 128   Lipid Panel    Component Value Date/Time   CHOL 188 10/29/2016 0859   TRIG 120.0 10/29/2016 0859   HDL 73.20 10/29/2016 0859   CHOLHDL 3 10/29/2016 0859   VLDL 24.0 10/29/2016 0859   LDLCALC 91 10/29/2016 0859   LDLDIRECT 134.0 02/25/2015 0854    Additional studies/ records that were reviewed today include:   Abdominal /pelvic CT performed 05/16/16: IMPRESSION: Thickened rectum can be seen with internal hemorrhoids or proctitis.  At least 2 RIGHT breast masses measure up to 2.5 cm, highly suspicious for neoplasm in patient with history of breast cancer. Recommend mammogram and ultrasound.  9 mm fluid distended endometrium recommend pelvic ultrasound on nonemergent basis. Thickened cervix, recommend direct inspection.  Severe calcific atherosclerosis.   ASSESSMENT:    1. Premature ventricular contractions   2. Palpitations   3. Essential hypertension   4. Aortic atherosclerosis ([place])   5. Hyperlipidemia, unspecified hyperlipidemia type   6. Family history of abdominal aortic aneurysm   7. CKD (chronic kidney disease), stage III      PLAN:  In order of problems listed above:  1. 48-hour Holter monitor  to quantify PVC activity. Also will help to exclude the possibility of sustained arrhythmias or could cause fatigue not related to tamoxifen. 2-D Doppler echocardiogram is also going to be performed to assess LV size, function, and right ventricle. 2. Palpitations and particularly the patient's extreme fatigue that went on for several weeks greater than one month ago suggests that a long monitoring may need to be done to exclude atrial fibrillation. 3. Systolic blood pressures elevated but they're so many premature beats that the systolic is likely elevated because of post PVC augmentation of contractility. No change in therapy at the current time. 4. LDL target cholesterol should be less than 100 and probably less than 70. 5. Consider adding statin therapy. For the time being we have increased her niacin to 1000 mg per day.  Clinical follow-up in one month. 2-D Doppler echocardiogram of 48-hour Holter monitor are being performed. May need an increase in beta blocker therapy but we will first see what the monitor reveals.  Medication Adjustments/Labs and Tests Ordered: Current medicines are reviewed at length with the patient today.  Concerns regarding medicines are outlined above.  Medication changes, Labs and Tests ordered today are listed in the Patient Instructions below. Patient Instructions  Medication Instructions:  1) START Aspirin 81mg  once daily  Labwork: None  Testing/Procedures: Your physician has requested that you have an echocardiogram. Echocardiography is a painless test that uses sound waves to create images of your heart. It provides your doctor with information about the size and shape of your heart and how well your heart's chambers and valves are working. This procedure takes approximately one hour. There are no restrictions for this procedure.  Your physician has recommended that you wear a 48 hour holter monitor. Holter monitors are medical devices that record the heart's  electrical activity. Doctors most often use these monitors to diagnose arrhythmias. Arrhythmias are problems with the speed or rhythm of the heartbeat. The monitor is a small, portable device. You can wear one while you do your normal daily activities. This is usually used to diagnose what is causing palpitations/syncope (passing out).    Follow-Up: Your physician recommends that you schedule a follow-up appointment in: 1 month  with Dr. Tamala Julian.    Any Other Special Instructions Will Be Listed Below (If Applicable).     If you need a refill on your cardiac medications before your next appointment, please call your pharmacy.      Signed, Sinclair Grooms, MD  11/25/2016 9:18 AM    [facility] [place], [place], [place]  [address] Phone: [phone]; Fax: [phone]

## 2016-11-26 ENCOUNTER — Other Ambulatory Visit: Payer: Self-pay

## 2016-11-26 ENCOUNTER — Ambulatory Visit (HOSPITAL_COMMUNITY): Payer: Medicare HMO | Attending: Cardiology

## 2016-11-26 DIAGNOSIS — E785 Hyperlipidemia, unspecified: Secondary | ICD-10-CM | POA: Diagnosis not present

## 2016-11-26 DIAGNOSIS — C50919 Malignant neoplasm of unspecified site of unspecified female breast: Secondary | ICD-10-CM | POA: Diagnosis not present

## 2016-11-26 DIAGNOSIS — Z923 Personal history of irradiation: Secondary | ICD-10-CM | POA: Diagnosis not present

## 2016-11-26 DIAGNOSIS — I083 Combined rheumatic disorders of mitral, aortic and tricuspid valves: Secondary | ICD-10-CM | POA: Insufficient documentation

## 2016-11-26 DIAGNOSIS — R002 Palpitations: Secondary | ICD-10-CM | POA: Diagnosis not present

## 2016-11-26 DIAGNOSIS — J45909 Unspecified asthma, uncomplicated: Secondary | ICD-10-CM | POA: Insufficient documentation

## 2016-11-26 DIAGNOSIS — I10 Essential (primary) hypertension: Secondary | ICD-10-CM | POA: Insufficient documentation

## 2016-12-01 ENCOUNTER — Encounter: Payer: Self-pay | Admitting: *Deleted

## 2016-12-06 ENCOUNTER — Other Ambulatory Visit: Payer: Self-pay | Admitting: Family Medicine

## 2016-12-07 ENCOUNTER — Ambulatory Visit (INDEPENDENT_AMBULATORY_CARE_PROVIDER_SITE_OTHER): Payer: Medicare HMO

## 2016-12-07 DIAGNOSIS — R002 Palpitations: Secondary | ICD-10-CM

## 2016-12-14 ENCOUNTER — Telehealth: Payer: Self-pay | Admitting: *Deleted

## 2016-12-14 NOTE — Telephone Encounter (Signed)
"  Tamoxifen is making me extremely tired.  I can hardly walk across the floor.  I cannot take this anymore.  The last dose was taken Friday night (12-11-2016).  I also have seen my cardiologist last week.  Not sure if it's related but I have an extra heart beat.  2D echo was done.  Return number [phone]."

## 2016-12-14 NOTE — Telephone Encounter (Signed)
Will call and follow up with patient.

## 2016-12-15 ENCOUNTER — Other Ambulatory Visit: Payer: Self-pay

## 2016-12-15 MED ORDER — ANASTROZOLE 1 MG PO TABS: 1.0000 mg | ORAL_TABLET | Freq: Every day | ORAL | 0 refills | Status: DC

## 2016-12-15 NOTE — Progress Notes (Signed)
Called pt to see how she was doing with tamoxifen side effect. Pt states that she was taking 1/2 a pill and increased to a whole pill recently. Pt started tamoxifen 3 months ago. Pt experiencing severe fatigue to the point where she is unable to get up out of bed. Pt states that she feels terrible. She had stopped taking tamoxifen since Friday last week 7/13. Pt started to have better energy by Sunday. Pt states that she does not want to go back on tamoxifen and would like to let Dr.Gudena know to prescribe her something different. Discussed with Dr.Gudena about pt symptoms and if okay to get pt on anastrozole to try. Obtained order per MD and will send to her pharmacy today. Notified pt and instructed on possible side effects that she may experience with anastrozole. Advised pt to take anastrozole 1/2 tablet to start for a week and then increase to 1 whole pill daily. Preferred to take at night. Told pt to call with any issues and concerns. Pt verbalized understanding and will monitor for symptoms and side effects.

## 2016-12-16 ENCOUNTER — Encounter: Payer: Self-pay | Admitting: Interventional Cardiology

## 2016-12-16 ENCOUNTER — Ambulatory Visit (INDEPENDENT_AMBULATORY_CARE_PROVIDER_SITE_OTHER): Payer: Medicare HMO | Admitting: Interventional Cardiology

## 2016-12-16 VITALS — BP 146/78 | HR 56 | Ht 62.0 in | Wt 154.8 lb

## 2016-12-16 DIAGNOSIS — I700 Atherosclerosis of aorta: Secondary | ICD-10-CM | POA: Diagnosis not present

## 2016-12-16 DIAGNOSIS — I493 Ventricular premature depolarization: Secondary | ICD-10-CM | POA: Diagnosis not present

## 2016-12-16 DIAGNOSIS — E785 Hyperlipidemia, unspecified: Secondary | ICD-10-CM

## 2016-12-16 DIAGNOSIS — I10 Essential (primary) hypertension: Secondary | ICD-10-CM

## 2016-12-16 MED ORDER — METOPROLOL SUCCINATE ER 50 MG PO TB24: 50.0000 mg | ORAL_TABLET | Freq: Every day | ORAL | 3 refills | Status: DC

## 2016-12-16 NOTE — Progress Notes (Signed)
Cardiology Office Note    Date:  12/16/2016   ID:  Lisa Greene, DOB May 17, 1941, MRN [id number]  PCP:  Marin Olp, MD  Cardiologist: Sinclair Grooms, MD   Chief Complaint  Patient presents with  . Palpitations    History of Present Illness:  Lisa Greene is a 76 y.o. female who was referred last month because of palpitations. A workup including 2-D Doppler echocardiogram was performed.  Lisa Greene feels much better and when seen late last month. Palpitations have resolved. 48 hour Holter monitor demonstrated multiform PVCs at times in bigeminy and with one run of 4 beats. Complaint of palpitations correlated with isolated PVCs. No sustained arrhythmia was noted. She now feels much better. She equates improvement in the way she feels with stopping tamoxifen. She was not on tamoxifen when the scan was done. Her 2-D Doppler echocardiogram is also returned normal.  Past Medical History:  Diagnosis Date  . ALLERGIC RHINITIS 01/31/2008  . Allergy   . Asthma   . Breast cancer ([place])   . Breast cancer of lower-inner quadrant of left female breast ([place]) 11/01/2015  . Cataract   . Colon cancer screening 05/21/2014   No polyps at age 89. Diverticulosis alone-no more colonoscopies.    Lisa Greene DIVERTICULITIS, HX OF 09/07/2008   pt denies this   . Eosinophilia 02/08/2009  . Fibroid   . Heart murmur    slight per pt.   Lisa Greene HYPERLIPIDEMIA 12/20/2006  . Hypertension   . Osteopenia   . OSTEOPOROSIS 12/20/2006  . Vitamin B 12 deficiency     Past Surgical History:  Procedure Laterality Date  . BREAST LUMPECTOMY WITH NEEDLE LOCALIZATION Left 01/03/2016   Procedure: BREAST re-excision LUMPECTOMY WITH NEEDLE LOCALIZATION;  Surgeon: Rolm Bookbinder, MD;  Location: [facility];  Service: General;  Laterality: Left;  BREAST re-excision LUMPECTOMY WITH NEEDLE LOCALIZATION  . BREAST LUMPECTOMY WITH RADIOACTIVE SEED LOCALIZATION Left 11/28/2015   Procedure: LEFT BREAST LUMPECTOMY WITH  BRACKETED RADIOACTIVE SEED LOCALIZATION;  Surgeon: Rolm Bookbinder, MD;  Location: [facility];  Service: General;  Laterality: Left;  . BREAST SURGERY     cysts removed x2  . CATARACT EXTRACTION BILATERAL W/ ANTERIOR VITRECTOMY  2013   bilateral cataracts  . COLONOSCOPY  2005   tics only   . KNEE ARTHROSCOPY     left  . NEUROMA SURGERY     x2 feet  . thyroid duct cyst      Current Medications: Outpatient Medications Prior to Visit  Medication Sig Dispense Refill  . anastrozole (ARIMIDEX) 1 MG tablet Take 1 tablet (1 mg total) by mouth daily. 30 tablet 0  . aspirin EC 81 MG tablet Take 1 tablet (81 mg total) by mouth daily. 90 tablet 3  . cholecalciferol (VITAMIN D) 1000 UNITS tablet Take 2,000 Units by mouth daily.    . fluticasone (FLONASE) 50 MCG/ACT nasal spray Place 1 spray into both nostrils daily as needed.     . fluticasone furoate-vilanterol (BREO ELLIPTA) 100-25 MCG/INH AEPB Inhale 1 puff into the lungs daily.    . hydrochlorothiazide (HYDRODIURIL) 25 MG tablet TAKE 1 TABLET (25 MG TOTAL) BY MOUTH DAILY. 90 tablet 1  . losartan (COZAAR) 100 MG tablet TAKE 1 TABLET (100 MG TOTAL) BY MOUTH DAILY. 90 tablet 2  . meclizine (ANTIVERT) 25 MG tablet Take 1 tablet (25 mg total) by mouth 3 (three) times daily as needed for dizziness. 30 tablet 0  . montelukast (SINGULAIR)  10 MG tablet Take 1 tablet by mouth at bedtime.    . niacin 500 MG tablet Take 500 mg by mouth at bedtime.    . NON FORMULARY Inject as directed once a week. Vitamin B12 Injection once weekly    . Omega-3 Fatty Acids (FISH OIL) 1000 MG CAPS Take 1,000 mg by mouth Nightly.    . metoprolol succinate (TOPROL-XL) 25 MG 24 hr tablet TAKE 1 TABLET (25 MG TOTAL) BY MOUTH DAILY. 90 tablet 2  . Albuterol Sulfate (PROAIR RESPICLICK) 914 (90 BASE) MCG/ACT AEPB Inhale 1 puff into the lungs every 8 (eight) hours as needed. (Patient not taking: Reported on 12/16/2016) 1 each 0  . tamoxifen (NOLVADEX) 20 MG tablet  Take 1 tablet (20 mg total) by mouth daily. (Patient not taking: Reported on 11/12/2016) 90 tablet 3   No facility-administered medications prior to visit.      Allergies:   Lisinopril; Pork-derived products; Latex; Penicillins; Sulfonamide derivatives; and Tape   Social History   Social History  . Marital status: Married    Spouse name: N/A  . Number of children: N/A  . Years of education: N/A   Social History Main Topics  . Smoking status: Never Smoker  . Smokeless tobacco: Never Used  . Alcohol use No  . Drug use: No  . Sexual activity: Yes    Partners: Male    Birth control/ protection: Post-menopausal   Other Topics Concern  . None   Social History Narrative   Married. No kids- 2 step kids. 5 step grandkids.       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   ROS:   Please see the history of present illness.    Back pain. Irregular heart beat is much less. Dizziness, and easy bruising.  All other systems reviewed and are negative.   PHYSICAL EXAM:   VS:  BP (!) 146/78 (BP Location: Right Arm)   Pulse (!) 56   Ht 5\' 2"  (1.575 m)   Wt 154 lb 12.8 oz (70.2 kg)   BMI 28.31 kg/m    GEN: Well nourished, well developed, in no acute distress  HEENT: normal  Neck: no JVD, carotid bruits, or masses Cardiac: IRR; no murmurs, rubs, or gallops,no edema  Respiratory:  clear to auscultation bilaterally, normal work of breathing GI: soft, nontender, nondistended, + BS MS: no deformity or atrophy  Skin: warm and dry, no rash Neuro:  Alert and Oriented x 3, Strength and sensation are intact Psych: euthymic mood, full affect  Wt Readings from Last 3 Encounters:  12/16/16 154 lb 12.8 oz (70.2 kg)  11/25/16 154 lb 6.4 oz (70 kg)  11/12/16 154 lb 3.2 oz (69.9 kg)       Studies/Labs Reviewed:   EKG:  EKG  No new tracing.  Recent Labs: 09/22/2016: TSH 3.47 10/29/2016: ALT 12 11/12/2016: BUN 15; Creatinine, Ser 1.21; Hemoglobin 12.3; Platelets 297.0; Potassium 4.1; Sodium 128   Lipid Panel    Component Value Date/Time   CHOL 188 10/29/2016 0859   TRIG 120.0 10/29/2016 0859   HDL 73.20 10/29/2016 0859   CHOLHDL 3 10/29/2016 0859   VLDL 24.0 10/29/2016 0859   LDLCALC 91 10/29/2016 0859   LDLDIRECT 134.0 02/25/2015 0854    Additional  studies/ records that were reviewed today include:  Echocardiogram 11/26/16: Study Conclusions  - Left ventricle: The cavity size was normal. Systolic function was   normal. The estimated ejection fraction was in the range of 60%   to 65%. Wall motion was normal; there were no regional wall   motion abnormalities. Doppler parameters are consistent with   abnormal left ventricular relaxation (grade 1 diastolic   dysfunction). There was no evidence of elevated ventricular   filling pressure by Doppler parameters. - Aortic valve: There was trivial regurgitation. - Aortic root: The aortic root was normal in size. - Mitral valve: There was mild regurgitation. - Left atrium: The atrium was normal in size. - Right ventricle: Systolic function was normal. - Right atrium: The atrium was normal in size. - Tricuspid valve: There was mild regurgitation. - Pulmonary arteries: Systolic pressure was mildly increased. PA   peak pressure: 38 mm Hg (S). - Inferior vena cava: The vessel was normal in size. - Pericardium, extracardiac: There was no pericardial effusion.  ------------------------------------------------------------------- Labs, prior tests, procedures, and surgery: Echocardiography (2004).     EF was 55%.  48 hour Holter monitor: Performed June 2018  NSR with frequent multiform PVC's  27% of all activity is PVC   HEART RATE EPISODES Minimum HR: 54 BPM at 2:54:42 AM(2) Maximum HR: 126 BPM at 11:34:19  AM Average HR: 80 BPM    ASSESSMENT:    1. Premature ventricular contractions   2. Essential hypertension   3. Aortic atherosclerosis ([place])   4. Hyperlipidemia, unspecified hyperlipidemia type      PLAN:  In order of problems listed above:  1. Frequent PVCs. Increase metoprolol to 50 mg daily. There is no evidence of left ventricular dysfunction. We'll plan no further changes or referral. If she develops dyspnea or other complaints such as recurrent increased frequency of palpitations, she should return. 2. Increase metoprolol succinate 250 mg per day. 3. Manage lipid to keep LDL cholesterol at 70 or less. 4. LDL target less than 70 given aortic atherosclerosis.  Close clinical follow-up. If any chest discomfort or exertional dyspnea consider repeat coronary ischemic workup. LDL cholesterol target less than 70. Increase beta blocker dose for better blood pressure control and PVC suppression. Consider repeating 24-hour Holter monitor in 6-12 months. Earlier repeat of Holter monitor if symptoms recur    Medication Adjustments/Labs and Tests Ordered: Current medicines are reviewed at length with the patient today.  Concerns regarding medicines are outlined above.  Medication changes, Labs and Tests ordered today are listed in the Patient Instructions below. Patient Instructions  Medication Instructions:  1) INCREASE Metoprolol Succinate to 50mg  once daily  Labwork: None  Testing/Procedures: None  Follow-Up: Your physician recommends that you schedule a follow-up appointment as needed with Dr. Tamala Julian.   Any Other Special Instructions Will Be Listed Below (If Applicable).     If you need a refill on your cardiac medications before your next appointment, please call your pharmacy.      Signed, Sinclair Grooms, MD  12/16/2016 4:10 PM    [facility] [place], [place], [place]  [address] Phone: [phone]; Fax: [phone]

## 2016-12-16 NOTE — Patient Instructions (Signed)
Medication Instructions:  1) INCREASE Metoprolol Succinate to 50mg  once daily  Labwork: None  Testing/Procedures: None  Follow-Up: Your physician recommends that you schedule a follow-up appointment as needed with Dr. Tamala Julian.   Any Other Special Instructions Will Be Listed Below (If Applicable).     If you need a refill on your cardiac medications before your next appointment, please call your pharmacy.

## 2017-01-05 ENCOUNTER — Ambulatory Visit: Payer: Medicare HMO | Admitting: Interventional Cardiology

## 2017-01-08 DIAGNOSIS — H5212 Myopia, left eye: Secondary | ICD-10-CM | POA: Diagnosis not present

## 2017-01-08 DIAGNOSIS — H26491 Other secondary cataract, right eye: Secondary | ICD-10-CM | POA: Diagnosis not present

## 2017-01-08 DIAGNOSIS — Z961 Presence of intraocular lens: Secondary | ICD-10-CM | POA: Diagnosis not present

## 2017-01-15 ENCOUNTER — Other Ambulatory Visit: Payer: Self-pay | Admitting: Hematology and Oncology

## 2017-02-11 ENCOUNTER — Ambulatory Visit (HOSPITAL_BASED_OUTPATIENT_CLINIC_OR_DEPARTMENT_OTHER): Payer: Medicare HMO | Admitting: Hematology and Oncology

## 2017-02-11 DIAGNOSIS — Z79811 Long term (current) use of aromatase inhibitors: Secondary | ICD-10-CM | POA: Diagnosis not present

## 2017-02-11 DIAGNOSIS — D0512 Intraductal carcinoma in situ of left breast: Secondary | ICD-10-CM | POA: Diagnosis not present

## 2017-02-11 DIAGNOSIS — Z170 Estrogen receptor positive status [ER+]: Secondary | ICD-10-CM

## 2017-02-11 DIAGNOSIS — C50312 Malignant neoplasm of lower-inner quadrant of left female breast: Secondary | ICD-10-CM

## 2017-02-11 MED ORDER — ANASTROZOLE 1 MG PO TABS: 1.0000 mg | ORAL_TABLET | Freq: Every day | ORAL | 3 refills | Status: DC

## 2017-02-11 NOTE — Assessment & Plan Note (Signed)
Left lumpectomy 11/28/2015: DCIS with calcifications, high-grade, 1.3 cm, focally less than 0.1 cm to posterior margin and focally less than 0.1 cm lateral margin, ER 95%, PR 80%, TisN0 stage 0  Adj XRT 02/05/16- 03/03/16 Current Treatment: Adj Tamoxifen 20 mg daily X 5 years started 04/30/2016 Tamoxifen toxicities:Denies any hot flashes or myalgias.  CT chest 05/17/2016 performed for evaluation of left lower quadrant pain and nausea revealed right breast masses suspicious for cancer. Ultrasound of the right breast performed on 06/09/2016: There are multiple cysts in the right breast with a benign. No sonographic evidence of malignancy.  Surveillance:  Breast exam 12/11/2016 Mammogram 10/06/2016  Return to clinic in 1 year for follow-up

## 2017-02-11 NOTE — Progress Notes (Signed)
Patient Care Team: Marin Olp, MD as PCP - General (Family Medicine) Rolm Bookbinder, MD as Consulting Physician (General Surgery) Nicholas Lose, MD as Consulting Physician (Hematology and Oncology) Kyung Rudd, MD as Consulting Physician (Radiation Oncology)  DIAGNOSIS:  Encounter Diagnosis  Name Primary?  . Malignant neoplasm of lower-inner quadrant of left breast in female, estrogen receptor positive ([place])     SUMMARY OF ONCOLOGIC HISTORY:   Breast cancer of lower-inner quadrant of left female breast ([place])   10/30/2015 Initial Diagnosis    Left breast biopsy: DCIS intermediate grade with calcifications (micropapillary architecture), fibrocystic changes with adenosis, complex sclerosing lesion, UDH, ER 95%, PR 80%; mammogram in ultrasound showed right breast calcifications 4.1 cm      11/28/2015 Surgery    Left lumpectomy: DCIS with calcifications, high-grade, 1.3 cm, focally less than 0.1 cm to posterior margin and focally less than 0.1 cm lateral margin, ER 95%, PR 80%, TisN0 stage 0      02/05/2016 - 03/03/2016 Radiation Therapy    Adj XRT      04/30/2016 -  Anti-estrogen oral therapy    Tamoxifen 20 mg daily switched to Anastrozole for tachycardia (Sept 2018)       CHIEF COMPLIANT: Follow-up on tamoxifen therapy  INTERVAL HISTORY: Lisa Greene is a 76 year old with above-mentioned history left breast cancer treated with lumpectomy and radiation is currently on tamoxifen. She is tolerating tamoxifen fairly well. She denies any hot flashes or myalgias. She denies any lumps or nodules in the breast.  REVIEW OF SYSTEMS:   Constitutional: Denies fevers, chills or abnormal weight loss Eyes: Denies blurriness of vision Ears, nose, mouth, throat, and face: Denies mucositis or sore throat Respiratory: Denies cough, dyspnea or wheezes Cardiovascular: Denies palpitation, chest discomfort Gastrointestinal:  Denies nausea, heartburn or change in bowel habits Skin:  Denies abnormal skin rashes Lymphatics: Denies new lymphadenopathy or easy bruising Neurological:Denies numbness, tingling or new weaknesses Behavioral/Psych: Mood is stable, no new changes  Extremities: No lower extremity edema Breast:  denies any pain or lumps or nodules in either breasts All other systems were reviewed with the patient and are negative.  I have reviewed the past medical history, past surgical history, social history and family history with the patient and they are unchanged from previous note.  ALLERGIES:  is allergic to lisinopril; pork-derived products; latex; penicillins; sulfonamide derivatives; and tape.  MEDICATIONS:  Current Outpatient Prescriptions  Medication Sig Dispense Refill  . Albuterol Sulfate (PROAIR RESPICLICK) 494 (90 Base) MCG/ACT AEPB Inhale 1 puff into the lungs every 8 (eight) hours as needed (wheezing/shortness of breath).    Marland Kitchen anastrozole (ARIMIDEX) 1 MG tablet Take 1 tablet (1 mg total) by mouth daily. 90 tablet 3  . aspirin EC 81 MG tablet Take 1 tablet (81 mg total) by mouth daily. 90 tablet 3  . cholecalciferol (VITAMIN D) 1000 UNITS tablet Take 2,000 Units by mouth daily.    . fluticasone (FLONASE) 50 MCG/ACT nasal spray Place 1 spray into both nostrils daily as needed.     . fluticasone furoate-vilanterol (BREO ELLIPTA) 100-25 MCG/INH AEPB Inhale 1 puff into the lungs daily.    . hydrochlorothiazide (HYDRODIURIL) 25 MG tablet TAKE 1 TABLET (25 MG TOTAL) BY MOUTH DAILY. 90 tablet 1  . losartan (COZAAR) 100 MG tablet TAKE 1 TABLET (100 MG TOTAL) BY MOUTH DAILY. 90 tablet 2  . meclizine (ANTIVERT) 25 MG tablet Take 1 tablet (25 mg total) by mouth 3 (three) times daily as needed for dizziness.  30 tablet 0  . metoprolol succinate (TOPROL-XL) 50 MG 24 hr tablet Take 1 tablet (50 mg total) by mouth daily. Take with or immediately following a meal. 90 tablet 3  . montelukast (SINGULAIR) 10 MG tablet Take 1 tablet by mouth at bedtime.    . niacin 500  MG tablet Take 500 mg by mouth at bedtime.    . NON FORMULARY Inject as directed once a week. Vitamin B12 Injection once weekly    . Omega-3 Fatty Acids (FISH OIL) 1000 MG CAPS Take 1,000 mg by mouth Nightly.     No current facility-administered medications for this visit.     PHYSICAL EXAMINATION: ECOG PERFORMANCE STATUS: 0 - Asymptomatic  Vitals:   02/11/17 1029  BP: (!) 136/54  Pulse: 70  Resp: 18  Temp: 97.9 F (36.6 C)  SpO2: 100%   Filed Weights   02/11/17 1029  Weight: 155 lb 14.4 oz (70.7 kg)    GENERAL:alert, no distress and comfortable SKIN: skin color, texture, turgor are normal, no rashes or significant lesions EYES: normal, Conjunctiva are pink and non-injected, sclera clear OROPHARYNX:no exudate, no erythema and lips, buccal mucosa, and tongue normal  NECK: supple, thyroid normal size, non-tender, without nodularity LYMPH:  no palpable lymphadenopathy in the cervical, axillary or inguinal LUNGS: clear to auscultation and percussion with normal breathing effort HEART: regular rate & rhythm and no murmurs and no lower extremity edema ABDOMEN:abdomen soft, non-tender and normal bowel sounds MUSCULOSKELETAL:no cyanosis of digits and no clubbing  NEURO: alert & oriented x 3 with fluent speech, no focal motor/sensory deficits EXTREMITIES: No lower extremity edema BREAST: No palpable masses or nodules in either right or left breasts. No palpable axillary supraclavicular or infraclavicular adenopathy no breast tenderness or nipple discharge. (exam performed in the presence of a chaperone)  LABORATORY DATA:  I have reviewed the data as listed   Chemistry      Component Value Date/Time   NA 128 (L) 11/12/2016 1704   NA 137 11/06/2015 1219   K 4.1 11/12/2016 1704   K 4.3 11/06/2015 1219   CL 95 (L) 11/12/2016 1704   CO2 25 11/12/2016 1704   CO2 26 11/06/2015 1219   BUN 15 11/12/2016 1704   BUN 12.8 11/06/2015 1219   CREATININE 1.21 (H) 11/12/2016 1704    CREATININE 1.1 11/06/2015 1219      Component Value Date/Time   CALCIUM 9.3 11/12/2016 1704   CALCIUM 9.9 11/06/2015 1219   ALKPHOS 41 10/29/2016 0859   ALKPHOS 63 11/06/2015 1219   AST 13 10/29/2016 0859   AST 15 11/06/2015 1219   ALT 12 10/29/2016 0859   ALT 13 11/06/2015 1219   BILITOT 0.6 10/29/2016 0859   BILITOT 0.57 11/06/2015 1219       Lab Results  Component Value Date   WBC 8.0 11/12/2016   HGB 12.3 11/12/2016   HCT 34.9 (L) 11/12/2016   MCV 90.0 11/12/2016   PLT 297.0 11/12/2016   NEUTROABS 5.5 11/12/2016    ASSESSMENT & PLAN:  Breast cancer of lower-inner quadrant of left female breast ([place]) Left lumpectomy 11/28/2015: DCIS with calcifications, high-grade, 1.3 cm, focally less than 0.1 cm to posterior margin and focally less than 0.1 cm lateral margin, ER 95%, PR 80%, TisN0 stage 0  Adj XRT 02/05/16- 03/03/16 Current Treatment: Adj Tamoxifen 20 mg daily X 5 years started 04/30/2016 Tamoxifen toxicities:Denies any hot flashes or myalgias.  CT chest 05/17/2016 performed for evaluation of left lower quadrant pain and nausea  revealed right breast masses suspicious for cancer. Ultrasound of the right breast performed on 06/09/2016: There are multiple cysts in the right breast with a benign. No sonographic evidence of malignancy.  Surveillance:  Breast exam 12/11/2016 Mammogram 10/06/2016  Return to clinic in 1 year for follow-up    I spent 25 minutes talking to the patient of which more than half was spent in counseling and coordination of care.  No orders of the defined types were placed in this encounter.  The patient has a good understanding of the overall plan. she agrees with it. she will call with any problems that may develop before the next visit here.   Rulon Eisenmenger, MD 02/11/17

## 2017-02-27 ENCOUNTER — Other Ambulatory Visit: Payer: Self-pay | Admitting: Family Medicine

## 2017-03-01 ENCOUNTER — Other Ambulatory Visit: Payer: Self-pay | Admitting: Dermatology

## 2017-03-01 DIAGNOSIS — C4491 Basal cell carcinoma of skin, unspecified: Secondary | ICD-10-CM

## 2017-03-01 DIAGNOSIS — C44319 Basal cell carcinoma of skin of other parts of face: Secondary | ICD-10-CM | POA: Diagnosis not present

## 2017-03-01 DIAGNOSIS — C44311 Basal cell carcinoma of skin of nose: Secondary | ICD-10-CM | POA: Diagnosis not present

## 2017-03-01 HISTORY — DX: Basal cell carcinoma of skin, unspecified: C44.91

## 2017-04-07 DIAGNOSIS — R69 Illness, unspecified: Secondary | ICD-10-CM | POA: Diagnosis not present

## 2017-04-14 ENCOUNTER — Ambulatory Visit (INDEPENDENT_AMBULATORY_CARE_PROVIDER_SITE_OTHER): Payer: Medicare HMO | Admitting: Family Medicine

## 2017-04-14 ENCOUNTER — Encounter: Payer: Self-pay | Admitting: Family Medicine

## 2017-04-14 VITALS — BP 130/70 | HR 50 | Ht 62.0 in | Wt 157.2 lb

## 2017-04-14 DIAGNOSIS — I493 Ventricular premature depolarization: Secondary | ICD-10-CM | POA: Insufficient documentation

## 2017-04-14 DIAGNOSIS — M545 Low back pain: Secondary | ICD-10-CM | POA: Diagnosis not present

## 2017-04-14 DIAGNOSIS — R8290 Unspecified abnormal findings in urine: Secondary | ICD-10-CM | POA: Diagnosis not present

## 2017-04-14 LAB — POC URINALSYSI DIPSTICK (AUTOMATED)
BILIRUBIN UA: NEGATIVE
Glucose, UA: NEGATIVE
KETONES UA: NEGATIVE
NITRITE UA: NEGATIVE
PH UA: 7 (ref 5.0–8.0)
Protein, UA: NEGATIVE
RBC UA: NEGATIVE
Spec Grav, UA: 1.015 (ref 1.010–1.025)
Urobilinogen, UA: 0.2 E.U./dL

## 2017-04-14 MED ORDER — CEPHALEXIN 500 MG PO CAPS: 500.0000 mg | ORAL_CAPSULE | Freq: Two times a day (BID) | ORAL | 0 refills | Status: AC

## 2017-04-14 NOTE — Progress Notes (Signed)
    Subjective:  Lisa Greene is a 76 y.o. female who presents today with a chief complaint of back pain.   HPI:  Back Pain, Acute Issue Symptoms started 2 days ago. Stable over that time. No dysuria or increased frequency.  She is having occasional hot flashes but no fevers.  No chills.  Symptoms feel like her past urinary tract infections.  No nausea or vomiting.  She is having a moderate amount of lower abdominal pain.  She was treated with Keflex in the past which she tolerated well without any side effects or adverse reactions.  No obvious alleviating or aggravating factors.  ROS: Per HPI  PMH: Smoking history reviewed.  Never smoker.  Objective:  Physical Exam: BP 130/70   Pulse (!) 50   Ht 5\' 2"  (1.575 m)   Wt 157 lb 3.2 oz (71.3 kg)   SpO2 99%   BMI 28.75 kg/m   Gen: NAD, resting comfortably CV: Frequent ectopic beats noted.  No murmurs. Pulm: NWOB, CTAB with no crackles, wheezes, or rhonchi GI: Mild lower abdominal tenderness without rebound or guarding.  Normal bowel sounds. MSK: No CVA tenderness. Skin: Warm, dry Neuro: Grossly normal, moves all extremities Psych: Normal affect and thought content  Results for orders placed or performed in visit on 04/14/17 (from the past 72 hour(s))  POCT Urinalysis Dipstick (Automated)     Status: Abnormal   Collection Time: 04/14/17  9:28 AM  Result Value Ref Range   Color, UA Yellow    Clarity, UA Clear    Glucose, UA Negative    Bilirubin, UA Negative    Ketones, UA Negative    Spec Grav, UA 1.015 1.010 - 1.025   Blood, UA Negative    pH, UA 7.0 5.0 - 8.0   Protein, UA Negative    Urobilinogen, UA 0.2 0.2 or 1.0 E.U./dL   Nitrite, UA Negative    Leukocytes, UA Small (1+) (A) Negative   Assessment/Plan:  Back pain, acute issue History and exam not consistent with musculoskeletal pain.  UA equivocal.  Will send for urine culture.  Given her history of prior UTIs, chronic kidney disease, and occasional hot flashes it  is reasonable to start treatment for presumed UTI.  Start Keflex 500 mg twice daily-she has been on this in the past and did well without adverse reactions.  Return precautions reviewed including fever, severe pain, nausea/vomiting.  [name]. Jerline Pain, MD 04/14/2017 10:08 AM

## 2017-04-15 LAB — URINE CULTURE
MICRO NUMBER:: 81284978
SPECIMEN QUALITY: ADEQUATE

## 2017-04-16 NOTE — Progress Notes (Signed)
Patient's urine culture was negative. She should finish her course of antibiotics. If she is still having pain, she needs to be seen in the office again.  [name]. Jerline Pain, MD 04/16/2017 8:17 AM

## 2017-05-03 DIAGNOSIS — H1045 Other chronic allergic conjunctivitis: Secondary | ICD-10-CM | POA: Diagnosis not present

## 2017-05-03 DIAGNOSIS — J4540 Moderate persistent asthma, uncomplicated: Secondary | ICD-10-CM | POA: Diagnosis not present

## 2017-05-03 DIAGNOSIS — J300 Vasomotor rhinitis: Secondary | ICD-10-CM | POA: Diagnosis not present

## 2017-05-05 ENCOUNTER — Ambulatory Visit (INDEPENDENT_AMBULATORY_CARE_PROVIDER_SITE_OTHER): Payer: Medicare HMO | Admitting: Family Medicine

## 2017-05-05 ENCOUNTER — Encounter: Payer: Self-pay | Admitting: Family Medicine

## 2017-05-05 VITALS — BP 136/66 | HR 68 | Temp 97.5°F | Ht 62.0 in | Wt 154.0 lb

## 2017-05-05 DIAGNOSIS — M79671 Pain in right foot: Secondary | ICD-10-CM

## 2017-05-05 DIAGNOSIS — N183 Chronic kidney disease, stage 3 unspecified: Secondary | ICD-10-CM

## 2017-05-05 DIAGNOSIS — I10 Essential (primary) hypertension: Secondary | ICD-10-CM | POA: Diagnosis not present

## 2017-05-05 DIAGNOSIS — E785 Hyperlipidemia, unspecified: Secondary | ICD-10-CM | POA: Diagnosis not present

## 2017-05-05 DIAGNOSIS — E538 Deficiency of other specified B group vitamins: Secondary | ICD-10-CM

## 2017-05-05 LAB — BASIC METABOLIC PANEL
BUN: 20 mg/dL (ref 6–23)
CALCIUM: 9.4 mg/dL (ref 8.4–10.5)
CO2: 26 mEq/L (ref 19–32)
Chloride: 98 mEq/L (ref 96–112)
Creatinine, Ser: 1.19 mg/dL (ref 0.40–1.20)
GFR: 46.77 mL/min — AB (ref >60.00)
Glucose, Bld: 95 mg/dL (ref 70–99)
POTASSIUM: 4.3 meq/L (ref 3.5–5.1)
Sodium: 134 mEq/L — ABNORMAL LOW (ref 135–145)

## 2017-05-05 LAB — VITAMIN B12: VITAMIN B 12: 701 pg/mL (ref 211–911)

## 2017-05-05 NOTE — Progress Notes (Signed)
Subjective:  Lisa Greene is a 76 y.o. year old very pleasant female patient who presents for/with See problem oriented charting ROS- no chest pain. Rare wheezing. No edema. Does have some foot pain as below   Past Medical History-  Patient Active Problem List   Diagnosis Date Noted  . Breast cancer of lower-inner quadrant of left female breast ([place]) 11/01/2015    Priority: High  . Low vitamin B12 level 06/24/2016    Priority: Medium  . CKD (chronic kidney disease), stage III ([place]) 10/22/2015    Priority: Medium  . Asthma, moderate persistent, well-controlled 04/19/2014    Priority: Medium  . Hypertension 11/25/2010    Priority: Medium  . Hyperlipidemia 12/20/2006    Priority: Medium  . Family history of abdominal aortic aneurysm 03/08/2015    Priority: Low  . Eosinophilia 02/08/2009    Priority: Low  . Allergic rhinitis 01/31/2008    Priority: Low  . Osteopenia 12/20/2006    Priority: Low  . Frequent PVCs 04/14/2017  . Aortic atherosclerosis ([place]) 11/23/2016    Medications- reviewed and updated Current Outpatient Medications  Medication Sig Dispense Refill  . Albuterol Sulfate (PROAIR RESPICLICK) 536 (90 Base) MCG/ACT AEPB Inhale 1 puff into the lungs every 8 (eight) hours as needed (wheezing/shortness of breath).    Marland Kitchen anastrozole (ARIMIDEX) 1 MG tablet Take 1 tablet (1 mg total) by mouth daily. 90 tablet 3  . aspirin EC 81 MG tablet Take 1 tablet (81 mg total) by mouth daily. 90 tablet 3  . cholecalciferol (VITAMIN D) 1000 UNITS tablet Take 2,000 Units by mouth daily.    . fluticasone (FLONASE) 50 MCG/ACT nasal spray Place 1 spray into both nostrils daily as needed.     . fluticasone furoate-vilanterol (BREO ELLIPTA) 100-25 MCG/INH AEPB Inhale 1 puff into the lungs daily.    . hydrochlorothiazide (HYDRODIURIL) 25 MG tablet TAKE 1 TABLET (25 MG TOTAL) BY MOUTH DAILY. 90 tablet 1  . losartan (COZAAR) 100 MG tablet TAKE 1 TABLET BY MOUTH DAILY 90 tablet 1  . montelukast  (SINGULAIR) 10 MG tablet Take 1 tablet by mouth at bedtime.    . niacin 500 MG tablet Take 500 mg by mouth at bedtime.    . Omega-3 Fatty Acids (FISH OIL) 1000 MG CAPS Take 1,000 mg by mouth Nightly.    . metoprolol succinate (TOPROL-XL) 50 MG 24 hr tablet Take 1 tablet (50 mg total) by mouth daily. Take with or immediately following a meal. 90 tablet 3   No current facility-administered medications for this visit.     Objective: BP 136/66 (BP Location: Right Arm, Patient Position: Sitting, Cuff Size: Normal)   Pulse 68   Temp (!) 97.5 F (36.4 C) (Oral)   Ht 5\' 2"  (1.575 m)   Wt 154 lb (69.9 kg)   SpO2 98%   BMI 28.17 kg/m  Gen: NAD, resting comfortably CV: RRR no murmurs rubs or gallops Lungs: CTAB no crackles, wheeze, rhonchi Ext: no edema Skin: warm, dry Neuro: normal gait, normal speech  Assessment/Plan:  Other issues 1. Intermittent right midfoot pain- getting better- she wants to hold off on seeing SM/ortho for now 2. Continues to see Dr. Donneta Romberg for allergy/asthma- doing well recently 3. Next visit discuss repeat DEXA  Hypertension S: controlled on hctz 25mg , losartan 100mg , metoprolol 25mg  50 mg XL (recent increase) BP Readings from Last 3 Encounters:  05/05/17 136/66  04/14/17 130/70  02/11/17 (!) 136/54  A/P: We discussed blood pressure goal of <140/90.  Continue current meds:  At goal  Low vitamin B12 level S: changed to oral b12 at CPE in june A/P: b12 remains adequately repleted but trending down- repeat in 6-12 months  CKD (chronic kidney disease), stage III S: GFR 45-55 range. Knows to avoid nsaids  A/P: BP controlled. Already on ARB in case proteinuric element   Hyperlipidemia On niacin though we have discussed no decreased mortality/morbidity in past. She would prefer to not go on statin. Update lipids next visit.    Future Appointments  Date Time Provider [place]  11/08/2017 10:00 AM Marin Olp, MD [facility]  11/08/2017 11:00  AM Williemae Area, RN [facility]  02/11/2018 10:00 AM Nicholas Lose, MD [facility] None   Return in about 6 months (around 11/03/2017) for physical.  Orders Placed This Encounter  Procedures  . Basic metabolic panel    [place]  . Vitamin B12   Return precautions advised.  Garret Reddish, MD

## 2017-05-05 NOTE — Assessment & Plan Note (Signed)
S: GFR 45-55 range. Knows to avoid nsaids  A/P: BP controlled. Already on ARB in case proteinuric element

## 2017-05-05 NOTE — Assessment & Plan Note (Signed)
On niacin though we have discussed no decreased mortality/morbidity in past. She would prefer to not go on statin. Update lipids next visit.

## 2017-05-05 NOTE — Patient Instructions (Signed)
Please stop by lab before you go  No changes today. Check in on sodium- may need a little more salt as long as blood pressure doesn't go up

## 2017-05-05 NOTE — Assessment & Plan Note (Signed)
S: changed to oral b12 at CPE in june A/P: b12 remains adequately repleted but trending down- repeat in 6-12 months

## 2017-05-05 NOTE — Assessment & Plan Note (Signed)
S: controlled on hctz 25mg , losartan 100mg , metoprolol 25mg  50 mg XL (recent increase) BP Readings from Last 3 Encounters:  05/05/17 136/66  04/14/17 130/70  02/11/17 (!) 136/54  A/P: We discussed blood pressure goal of <140/90. Continue current meds:  At goal

## 2017-05-27 DIAGNOSIS — C44311 Basal cell carcinoma of skin of nose: Secondary | ICD-10-CM | POA: Diagnosis not present

## 2017-06-04 ENCOUNTER — Telehealth: Payer: Self-pay | Admitting: Family Medicine

## 2017-06-07 NOTE — Telephone Encounter (Signed)
Please advise on medication refill request.  Copied from [place] [id number]. Topic: General - Other >> Jun 07, 2017  2:34 PM Yvette Rack wrote: Reason for CRM: patient calling to let her provider know that hydrochlorothiazide (HYDRODIURIL) 25 MG tablet   losartan (COZAAR) 100 MG tablet are on recall and need something else to take

## 2017-06-08 NOTE — Telephone Encounter (Signed)
Please check with her pharmacy about this. I thought it was the combo losartan-hctz. She is on individual components losartan and hctz

## 2017-06-09 NOTE — Telephone Encounter (Signed)
I called patients pharmacy and they state that none of her medications are on recall and she can continue taking what she has. I called patient and gave her this message and answered all questions.

## 2017-07-02 ENCOUNTER — Encounter: Payer: Self-pay | Admitting: Family Medicine

## 2017-07-02 ENCOUNTER — Ambulatory Visit (INDEPENDENT_AMBULATORY_CARE_PROVIDER_SITE_OTHER): Payer: Medicare HMO | Admitting: Family Medicine

## 2017-07-02 VITALS — BP 120/64 | HR 59 | Temp 98.4°F | Ht 62.0 in | Wt 155.0 lb

## 2017-07-02 DIAGNOSIS — I10 Essential (primary) hypertension: Secondary | ICD-10-CM

## 2017-07-02 MED ORDER — AMLODIPINE BESYLATE 2.5 MG PO TABS: 2.5000 mg | ORAL_TABLET | Freq: Every day | ORAL | 3 refills | Status: DC

## 2017-07-02 NOTE — Patient Instructions (Addendum)
Stop losartan per your request  Start amlodipine 2.5 mg  Continue all other medicines  See me back in 1 week for recheck

## 2017-07-02 NOTE — Progress Notes (Signed)
Subjective:  Lisa Greene is a 77 y.o. year old very pleasant female patient who presents for/with See problem oriented charting ROS- No chest pain or shortness of breath. No headache or blurry vision.  No edema.    Past Medical History-  Patient Active Problem List   Diagnosis Date Noted  . Breast cancer of lower-inner quadrant of left female breast ([place]) 11/01/2015    Priority: High  . Low vitamin B12 level 06/24/2016    Priority: Medium  . CKD (chronic kidney disease), stage III ([place]) 10/22/2015    Priority: Medium  . Asthma, moderate persistent, well-controlled 04/19/2014    Priority: Medium  . Hypertension 11/25/2010    Priority: Medium  . Hyperlipidemia 12/20/2006    Priority: Medium  . Family history of abdominal aortic aneurysm 03/08/2015    Priority: Low  . Eosinophilia 02/08/2009    Priority: Low  . Allergic rhinitis 01/31/2008    Priority: Low  . Osteopenia 12/20/2006    Priority: Low  . Frequent PVCs 04/14/2017  . Aortic atherosclerosis ([place]) 11/23/2016    Medications- reviewed and updated Current Outpatient Medications  Medication Sig Dispense Refill  . Albuterol Sulfate (PROAIR RESPICLICK) 161 (90 Base) MCG/ACT AEPB Inhale 1 puff into the lungs every 8 (eight) hours as needed (wheezing/shortness of breath).    Marland Kitchen anastrozole (ARIMIDEX) 1 MG tablet Take 1 tablet (1 mg total) by mouth daily. 90 tablet 3  . aspirin EC 81 MG tablet Take 1 tablet (81 mg total) by mouth daily. 90 tablet 3  . cholecalciferol (VITAMIN D) 1000 UNITS tablet Take 2,000 Units by mouth daily.    . fluticasone (FLONASE) 50 MCG/ACT nasal spray Place 1 spray into both nostrils daily as needed.     . fluticasone furoate-vilanterol (BREO ELLIPTA) 100-25 MCG/INH AEPB Inhale 1 puff into the lungs daily.    . hydrochlorothiazide (HYDRODIURIL) 25 MG tablet TAKE 1 TABLET BY MOUTH DAILY 90 tablet 1  . losartan (COZAAR) 100 MG tablet TAKE 1 TABLET BY MOUTH DAILY 90 tablet 1  . montelukast (SINGULAIR)  10 MG tablet Take 1 tablet by mouth at bedtime.    . niacin 500 MG tablet Take 1,000 mg by mouth at bedtime.     . Omega-3 Fatty Acids (FISH OIL) 1000 MG CAPS Take 1,000 mg by mouth Nightly.    . metoprolol succinate (TOPROL-XL) 50 MG 24 hr tablet Take 1 tablet (50 mg total) by mouth daily. Take with or immediately following a meal. 90 tablet 3   Objective: BP 120/64 (BP Location: Left Arm, Patient Position: Sitting, Cuff Size: Large)   Pulse (!) 59   Temp 98.4 F (36.9 C) (Oral)   Ht 5\' 2"  (1.575 m)   Wt 155 lb (70.3 kg)   SpO2 98%   BMI 28.35 kg/m  Gen: NAD, resting comfortably CV: RRR no murmurs rubs or gallops Lungs: CTAB no crackles, wheeze, rhonchi Abdomen: soft/nontender/nondistended/normal bowel sounds. overweight Ext: no edema Skin: warm, dry  Assessment/Plan:  Hypertension S: controlled on losartan 100mg  and hctz 25mg  and metoprolol 50mg  XL  She is very anxious about the losartan recalls and wants to stop the medicine despite her lot not being recalled. She has called her pharmacy to confirm but she thinks her lot will be the next lot that is recalled  She is also anxious about amlodipine due to prior eema BP Readings from Last 3 Encounters:  07/02/17 120/64  05/05/17 136/66  04/14/17 130/70  A/P:  blood pressure goal of <140/90.  Continue hctz and metoprolol 50mg  XL. She would like to stop the losartan- so we stopped this. Start amlodipine 2.5mg  recheck 1 week   Future Appointments  Date Time Provider [place]  07/09/2017 11:30 AM Marin Olp, MD [facility]  11/08/2017 10:00 AM Marin Olp, MD [facility]  11/08/2017 11:00 AM Williemae Area, RN [facility]  02/11/2018 10:00 AM Nicholas Lose, MD [facility] None   Meds ordered this encounter  Medications  . amLODipine (NORVASC) 2.5 MG tablet    Sig: Take 1 tablet (2.5 mg total) by mouth daily.    Dispense:  30 tablet    Refill:  3   The duration of face-to-face time during this  visit was greater than 15 minutes. Greater than 50% of this time was spent in counseling, explanation of diagnosis, planning of further management, and/or coordination of care including discussing concerns about BP meds and SE and discussing alternate options  Return precautions advised.  Garret Reddish, MD

## 2017-07-03 NOTE — Assessment & Plan Note (Signed)
S: controlled on losartan 100mg  and hctz 25mg  and metoprolol 50mg  XL  She is very anxious about the losartan recalls and wants to stop the medicine despite her lot not being recalled. She has called her pharmacy to confirm but she thinks her lot will be the next lot that is recalled  She is also anxious about amlodipine due to prior eema BP Readings from Last 3 Encounters:  07/02/17 120/64  05/05/17 136/66  04/14/17 130/70  A/P:  blood pressure goal of <140/90. Continue hctz and metoprolol 50mg  XL. She would like to stop the losartan- so we stopped this. Start amlodipine 2.5mg  recheck 1 week

## 2017-07-09 ENCOUNTER — Ambulatory Visit (INDEPENDENT_AMBULATORY_CARE_PROVIDER_SITE_OTHER): Payer: Medicare HMO | Admitting: Family Medicine

## 2017-07-09 ENCOUNTER — Encounter: Payer: Self-pay | Admitting: Family Medicine

## 2017-07-09 VITALS — BP 136/70 | HR 65 | Temp 98.7°F | Ht 62.0 in | Wt 153.4 lb

## 2017-07-09 DIAGNOSIS — C50312 Malignant neoplasm of lower-inner quadrant of left female breast: Secondary | ICD-10-CM | POA: Diagnosis not present

## 2017-07-09 DIAGNOSIS — Z170 Estrogen receptor positive status [ER+]: Secondary | ICD-10-CM | POA: Diagnosis not present

## 2017-07-09 DIAGNOSIS — I10 Essential (primary) hypertension: Secondary | ICD-10-CM | POA: Diagnosis not present

## 2017-07-09 DIAGNOSIS — I700 Atherosclerosis of aorta: Secondary | ICD-10-CM

## 2017-07-09 NOTE — Progress Notes (Signed)
Subjective:  Lisa Greene is a 77 y.o. year old very pleasant female patient who presents for/with See problem oriented charting ROS- No chest pain or shortness of breath. No headache or blurry vision.  Some palpitations.   Past Medical History-  Patient Active Problem List   Diagnosis Date Noted  . Breast cancer of lower-inner quadrant of left female breast ([place]) 11/01/2015    Priority: High  . Low vitamin B12 level 06/24/2016    Priority: Medium  . CKD (chronic kidney disease), stage III ([place]) 10/22/2015    Priority: Medium  . Asthma, moderate persistent, well-controlled 04/19/2014    Priority: Medium  . Hypertension 11/25/2010    Priority: Medium  . Hyperlipidemia 12/20/2006    Priority: Medium  . Family history of abdominal aortic aneurysm 03/08/2015    Priority: Low  . Eosinophilia 02/08/2009    Priority: Low  . Allergic rhinitis 01/31/2008    Priority: Low  . Osteopenia 12/20/2006    Priority: Low  . Frequent PVCs 04/14/2017  . Aortic atherosclerosis ([place]) 11/23/2016    Medications- reviewed and updated Current Outpatient Medications  Medication Sig Dispense Refill  . Albuterol Sulfate (PROAIR RESPICLICK) 456 (90 Base) MCG/ACT AEPB Inhale 1 puff into the lungs every 8 (eight) hours as needed (wheezing/shortness of breath).    Marland Kitchen amLODipine (NORVASC) 2.5 MG tablet Take 1 tablet (2.5 mg total) by mouth daily. 30 tablet 3  . anastrozole (ARIMIDEX) 1 MG tablet Take 1 tablet (1 mg total) by mouth daily. 90 tablet 3  . aspirin EC 81 MG tablet Take 1 tablet (81 mg total) by mouth daily. 90 tablet 3  . cholecalciferol (VITAMIN D) 1000 UNITS tablet Take 2,000 Units by mouth daily.    . fluticasone (FLONASE) 50 MCG/ACT nasal spray Place 1 spray into both nostrils daily as needed.     . fluticasone furoate-vilanterol (BREO ELLIPTA) 100-25 MCG/INH AEPB Inhale 1 puff into the lungs daily.    . hydrochlorothiazide (HYDRODIURIL) 25 MG tablet TAKE 1 TABLET BY MOUTH DAILY 90 tablet 1   . montelukast (SINGULAIR) 10 MG tablet Take 1 tablet by mouth at bedtime.    . niacin 500 MG tablet Take 1,000 mg by mouth at bedtime.     . Omega-3 Fatty Acids (FISH OIL) 1000 MG CAPS Take 1,000 mg by mouth Nightly.    . metoprolol succinate (TOPROL-XL) 50 MG 24 hr tablet Take 1 tablet (50 mg total) by mouth daily. Take with or immediately following a meal. 90 tablet 3   No current facility-administered medications for this visit.     Objective: BP 136/70   Pulse 65   Temp 98.7 F (37.1 C) (Oral)   Ht 5\' 2"  (1.575 m)   Wt 153 lb 6.4 oz (69.6 kg)   SpO2 98%   BMI 28.06 kg/m  Gen: NAD, resting comfortably CV: RRR no murmurs rubs or gallops Lungs: CTAB no crackles, wheeze, rhonchi Abdomen: soft/nontender/nondistended/normal bowel sounds. No rebound or guarding.  Ext: trace edema- slight increase from last week Skin: warm, dry  bp done in right arm due to breast cancer history  Assessment/Plan:  Hypertension S: controlled poorly on initial check on hctz 25mg , amlodipine 2.5mg  (new start as she wanted to stop losartan), metoprolol 50mg  XL. Home # 137/78 yesterday. Feels like HR somewhat higher- in 80s at times when checks HR BP Readings from Last 3 Encounters:  07/09/17 136/70  07/02/17 120/64  05/05/17 136/66  A/P: We discussed blood pressure goal of <140/90. Continue  current meds:  Does have a history of PVCs and palpitations likely related to those. No exertional chest pain or shortness of breath fortunately  Aortic atherosclerosis ([facility]) S: discussed prior finding of aortic atherosclerosis A/P:We discussed need for risk factor modification. Blood pressure at goal. Last LDL under 100. Non smoker.     Future Appointments  Date Time Provider [place]  11/08/2017 10:00 AM Marin Olp, MD [facility]  11/08/2017 11:00 AM Williemae Area, RN [facility]  02/11/2018 10:00 AM Nicholas Lose, MD [facility] None   Return precautions advised.  Garret Reddish, MD

## 2017-07-09 NOTE — Patient Instructions (Signed)
Blood pressure looks great today on repeat. Continue current medicines. Let me know if any side effects- you've done a great job of this in the past

## 2017-07-10 NOTE — Assessment & Plan Note (Signed)
S: discussed prior finding of aortic atherosclerosis A/P:We discussed need for risk factor modification. Blood pressure at goal. Last LDL under 100. Non smoker.

## 2017-07-10 NOTE — Assessment & Plan Note (Signed)
Remains on arimidex

## 2017-07-10 NOTE — Assessment & Plan Note (Signed)
S: controlled poorly on initial check on hctz 25mg , amlodipine 2.5mg  (new start as she wanted to stop losartan), metoprolol 50mg  XL. Home # 137/78 yesterday. Feels like HR somewhat higher- in 80s at times when checks HR BP Readings from Last 3 Encounters:  07/09/17 136/70  07/02/17 120/64  05/05/17 136/66  A/P: We discussed blood pressure goal of <140/90. Continue current meds:  Does have a history of PVCs and palpitations likely related to those. No exertional chest pain or shortness of breath fortunately

## 2017-07-16 ENCOUNTER — Ambulatory Visit (INDEPENDENT_AMBULATORY_CARE_PROVIDER_SITE_OTHER): Payer: Medicare HMO | Admitting: Family Medicine

## 2017-07-16 ENCOUNTER — Encounter: Payer: Self-pay | Admitting: Family Medicine

## 2017-07-16 VITALS — BP 128/74 | HR 54 | Temp 98.6°F | Ht 62.0 in | Wt 153.4 lb

## 2017-07-16 DIAGNOSIS — R05 Cough: Secondary | ICD-10-CM | POA: Diagnosis not present

## 2017-07-16 DIAGNOSIS — R059 Cough, unspecified: Secondary | ICD-10-CM

## 2017-07-16 MED ORDER — IPRATROPIUM BROMIDE 0.06 % NA SOLN: 2.0000 | Freq: Four times a day (QID) | NASAL | 0 refills | Status: DC

## 2017-07-16 MED ORDER — DOXYCYCLINE HYCLATE 100 MG PO TABS: 100.0000 mg | ORAL_TABLET | Freq: Two times a day (BID) | ORAL | 0 refills | Status: DC

## 2017-07-16 MED ORDER — BENZONATATE 200 MG PO CAPS: 200.0000 mg | ORAL_CAPSULE | Freq: Two times a day (BID) | ORAL | 0 refills | Status: DC | PRN

## 2017-07-16 NOTE — Patient Instructions (Signed)
Start the atrovent.  Start the tessalon and doxycycline if your symptoms worsen or do not improve in a few days.  Please stay well hydrated.  You can take tylenol as needed for low grade fever and pain.  Please let me know if your symptoms worsen or fail to improve.  Take care, Dr Jerline Pain

## 2017-07-16 NOTE — Progress Notes (Addendum)
   Subjective:  Lisa Greene is a 77 y.o. female who presents today for same-day appointment with a chief complaint of cough.   HPI:  Cough, Acute Issue Started yesterday.  Associated with mild rhinorrhea.  No shortness of breath.  No fever or chills.  No known sick contacts.  Hass not tried any over-the-counter medications.  No obvious precipitating events.  No other obvious alleviating or aggravating factors.  ROS: Per HPI  PMH: She reports that  has never smoked. she has never used smokeless tobacco. She reports that she does not drink alcohol or use drugs.  Objective:  Physical Exam: BP 128/74 (BP Location: Right Arm, Patient Position: Sitting, Cuff Size: Normal)   Pulse (!) 54   Temp 98.6 F (37 C) (Oral)   Ht 5\' 2"  (1.575 m)   Wt 153 lb 6.4 oz (69.6 kg)   SpO2 94%   BMI 28.06 kg/m   Gen: NAD, resting comfortably HEENT: TMs with clear effusion bilaterally.  Oropharynx clear.  Maxillary sinuses clear to transillumination bilaterally.  No lymphadenopathy. CV: RRR with no murmurs appreciated Pulm: NWOB, CTAB with no crackles, wheezes, or rhonchi  Assessment/Plan:  Cough Likely secondary to viral URI. No signs of bacterial infection. Start atrovent for rhinorrhea/sinus congestion. Start tessalon for cough. Sent in a "pocket prescription" for doxycycline with strict instruction to not start unless symptoms worsen or fail to improve within the next several days. Recommended tylenol as needed for low grade fever and Greene. Encouraged good oral hydration. Return precautions reviewed. Follow up as needed.   Lisa Greene. Lisa Pain, MD 07/16/2017 11:37 AM

## 2017-07-22 ENCOUNTER — Encounter: Payer: Self-pay | Admitting: Family Medicine

## 2017-07-22 ENCOUNTER — Ambulatory Visit (INDEPENDENT_AMBULATORY_CARE_PROVIDER_SITE_OTHER): Payer: Medicare HMO | Admitting: Family Medicine

## 2017-07-22 VITALS — BP 134/62 | HR 60 | Temp 98.2°F | Ht 62.0 in | Wt 153.0 lb

## 2017-07-22 DIAGNOSIS — R0981 Nasal congestion: Secondary | ICD-10-CM | POA: Diagnosis not present

## 2017-07-22 DIAGNOSIS — J069 Acute upper respiratory infection, unspecified: Secondary | ICD-10-CM

## 2017-07-22 MED ORDER — METHYLPREDNISOLONE ACETATE 40 MG/ML IJ SUSP
40.0000 mg | Freq: Once | INTRAMUSCULAR | Status: AC
  Administered 2017-07-22: 40 mg via INTRAMUSCULAR

## 2017-07-22 NOTE — Patient Instructions (Signed)
Steroid injection today- will likely reduce risk of asthma flaring up, may also help sinus inflammation clam down so they can drain better.   Can also use mucinex to see if it can loosen mucus  Can use nasal saline spray if sinuses feel like they are burning or humidifier at night.   If your sinus congestion persists for another 7 days- let us check you out again or if you have fever or shortness of breath

## 2017-07-22 NOTE — Progress Notes (Signed)
Subjective:  Lisa Greene is a 77 y.o. year old very pleasant female patient who presents for/with See problem oriented charting ROS- no fever. Had chills on Monday. Nasal congestion and cough noted but cough has improved. Fatigued. Mild shortness of breath   Past Medical History-  Patient Active Problem List   Diagnosis Date Noted  . Breast cancer of lower-inner quadrant of left female breast ([place]) 11/01/2015    Priority: High  . Low vitamin B12 level 06/24/2016    Priority: Medium  . CKD (chronic kidney disease), stage III ([place]) 10/22/2015    Priority: Medium  . Asthma, moderate persistent, well-controlled 04/19/2014    Priority: Medium  . Hypertension 11/25/2010    Priority: Medium  . Hyperlipidemia 12/20/2006    Priority: Medium  . Family history of abdominal aortic aneurysm 03/08/2015    Priority: Low  . Eosinophilia 02/08/2009    Priority: Low  . Allergic rhinitis 01/31/2008    Priority: Low  . Osteopenia 12/20/2006    Priority: Low  . Frequent PVCs 04/14/2017  . Aortic atherosclerosis ([place]) 11/23/2016    Medications- reviewed and updated Current Outpatient Medications  Medication Sig Dispense Refill  . Albuterol Sulfate (PROAIR RESPICLICK) 517 (90 Base) MCG/ACT AEPB Inhale 1 puff into the lungs every 8 (eight) hours as needed (wheezing/shortness of breath).    Marland Kitchen amLODipine (NORVASC) 2.5 MG tablet Take 1 tablet (2.5 mg total) by mouth daily. 30 tablet 3  . anastrozole (ARIMIDEX) 1 MG tablet Take 1 tablet (1 mg total) by mouth daily. 90 tablet 3  . aspirin EC 81 MG tablet Take 1 tablet (81 mg total) by mouth daily. 90 tablet 3  . benzonatate (TESSALON) 200 MG capsule Take 1 capsule (200 mg total) by mouth 2 (two) times daily as needed for cough. 20 capsule 0  . cholecalciferol (VITAMIN D) 1000 UNITS tablet Take 2,000 Units by mouth daily.    Marland Kitchen doxycycline (VIBRA-TABS) 100 MG tablet Take 1 tablet (100 mg total) by mouth 2 (two) times daily. 14 tablet 0  . fluticasone  (FLONASE) 50 MCG/ACT nasal spray Place 1 spray into both nostrils daily as needed.     . fluticasone furoate-vilanterol (BREO ELLIPTA) 100-25 MCG/INH AEPB Inhale 1 puff into the lungs daily.    . hydrochlorothiazide (HYDRODIURIL) 25 MG tablet TAKE 1 TABLET BY MOUTH DAILY 90 tablet 1  . ipratropium (ATROVENT) 0.06 % nasal spray Place 2 sprays into both nostrils 4 (four) times daily. 15 mL 0  . montelukast (SINGULAIR) 10 MG tablet Take 1 tablet by mouth at bedtime.    . niacin 500 MG tablet Take 1,000 mg by mouth at bedtime.     . Omega-3 Fatty Acids (FISH OIL) 1000 MG CAPS Take 1,000 mg by mouth Nightly.    . metoprolol succinate (TOPROL-XL) 50 MG 24 hr tablet Take 1 tablet (50 mg total) by mouth daily. Take with or immediately following a meal. 90 tablet 3   No current facility-administered medications for this visit.     Objective: BP 134/62 (BP Location: Left Arm, Patient Position: Sitting, Cuff Size: Large)   Pulse 60   Temp 98.2 F (36.8 C) (Oral)   Ht 5\' 2"  (1.575 m)   Wt 153 lb (69.4 kg)   SpO2 100%   BMI 27.98 kg/m  Gen: NAD, resting comfortably TM norma, oropharynx pharynx with mild erythema but otherwise normal. Nasal turbinates mildly erythematous with clear discharge CV: low normal heart rate, regular other than occasional ectopic beats Lungs:  CTAB no crackles, wheeze, rhonchi Abdomen: soft/nontender/nondistended/normal bowel sounds.  Ext: no edema Skin: warm, dry  Assessment/Plan:  Upper respiratory infection/nasal congestion S: Patient seen for URI 07/16/17 (about a week ago). She was given doxycycline in case symptoms worsened or she failed to improve. She had chills on Monday of this week so started doxycycline at that time. She states her Nasal congestion is slightly better. Ipratropium helps some nasally. Cough slightly improved- no longer deep cough. No longer burning in throat as much. Nares burn at times though. She feels fatigued and is on the cough most of the  time. No body aches.    Today, she is on day 6 of illness. Mild shortness of breath- albuterol helps some. no fever. No wheezing. No depo medrol last week after consultation with patient and Dr. Jerline Pain A/P: discussed patient lack of improvement on doxycycline suggests this is more likely viral URI than other origin- discussed up to 2 weeks of symptoms- I doubt bronchitis and longer duration though could develop.  She has continued nasal congestion so will focus on relief there with mucinex and depo medrol injection. If she fails to improve in another week could consider augmentin if nasal congestion persists. She also wanted the depo medrol in case her asthma was about to flare with mild SOb she has had through I hear no wheeze today.   Future Appointments  Date Time Provider [place]  11/08/2017 10:00 AM Marin Olp, MD [facility]  11/08/2017 11:00 AM Williemae Area, RN [facility]  02/11/2018 10:00 AM Nicholas Lose, MD [facility] None   Lab/Order associations: Sinus congestion - Plan: methylPREDNISolone acetate (DEPO-MEDROL) injection 40 mg  Return precautions advised.  Garret Reddish, MD

## 2017-08-03 ENCOUNTER — Encounter: Payer: Self-pay | Admitting: Physician Assistant

## 2017-08-03 ENCOUNTER — Ambulatory Visit (INDEPENDENT_AMBULATORY_CARE_PROVIDER_SITE_OTHER): Payer: Medicare HMO | Admitting: Physician Assistant

## 2017-08-03 ENCOUNTER — Ambulatory Visit (INDEPENDENT_AMBULATORY_CARE_PROVIDER_SITE_OTHER): Payer: Medicare HMO

## 2017-08-03 VITALS — BP 110/70 | Temp 98.1°F | Ht 62.0 in | Wt 154.4 lb

## 2017-08-03 DIAGNOSIS — R350 Frequency of micturition: Secondary | ICD-10-CM

## 2017-08-03 DIAGNOSIS — M545 Low back pain, unspecified: Secondary | ICD-10-CM

## 2017-08-03 DIAGNOSIS — N183 Chronic kidney disease, stage 3 unspecified: Secondary | ICD-10-CM

## 2017-08-03 DIAGNOSIS — M47816 Spondylosis without myelopathy or radiculopathy, lumbar region: Secondary | ICD-10-CM | POA: Diagnosis not present

## 2017-08-03 LAB — CBC WITH DIFFERENTIAL/PLATELET
BASOS ABS: 0.1 10*3/uL (ref 0.0–0.1)
Basophils Relative: 0.8 % (ref 0.0–3.0)
EOS PCT: 1.7 % (ref 0.0–5.0)
Eosinophils Absolute: 0.1 10*3/uL (ref 0.0–0.7)
HEMATOCRIT: 35.8 % — AB (ref 36.0–46.0)
Hemoglobin: 12.8 g/dL (ref 12.0–15.0)
LYMPHS PCT: 11.8 % — AB (ref 12.0–46.0)
Lymphs Abs: 0.9 10*3/uL (ref 0.7–4.0)
MCHC: 35.7 g/dL (ref 30.0–36.0)
MCV: 87.4 fl (ref 78.0–100.0)
MONOS PCT: 9.8 % (ref 3.0–12.0)
Monocytes Absolute: 0.8 10*3/uL (ref 0.1–1.0)
NEUTROS ABS: 6.1 10*3/uL (ref 1.4–7.7)
Neutrophils Relative %: 75.9 % (ref 43.0–77.0)
PLATELETS: 391 10*3/uL (ref 150.0–400.0)
RBC: 4.09 Mil/uL (ref 3.87–5.11)
RDW: 13.1 % (ref 11.5–15.5)
WBC: 8 10*3/uL (ref 4.0–10.5)

## 2017-08-03 LAB — POCT URINALYSIS DIPSTICK
BILIRUBIN UA: NEGATIVE
Glucose, UA: NEGATIVE
Ketones, UA: NEGATIVE
LEUKOCYTES UA: NEGATIVE
Nitrite, UA: NEGATIVE
PH UA: 6.5 (ref 5.0–8.0)
Protein, UA: NEGATIVE
RBC UA: NEGATIVE
Spec Grav, UA: 1.015 (ref 1.010–1.025)
UROBILINOGEN UA: 0.2 U/dL

## 2017-08-03 LAB — BASIC METABOLIC PANEL
BUN: 12 mg/dL (ref 6–23)
CO2: 30 mEq/L (ref 19–32)
Calcium: 9.2 mg/dL (ref 8.4–10.5)
Chloride: 89 mEq/L — ABNORMAL LOW (ref 96–112)
Creatinine, Ser: 1.05 mg/dL (ref 0.40–1.20)
GFR: 54.01 mL/min — AB (ref >60.00)
Glucose, Bld: 102 mg/dL — ABNORMAL HIGH (ref 70–99)
POTASSIUM: 3.9 meq/L (ref 3.5–5.1)
SODIUM: 124 meq/L — AB (ref 135–145)

## 2017-08-03 MED ORDER — CEPHALEXIN 500 MG PO CAPS: 500.0000 mg | ORAL_CAPSULE | Freq: Two times a day (BID) | ORAL | 0 refills | Status: AC

## 2017-08-03 NOTE — Patient Instructions (Signed)
It was great to meet you!  We will call you with your lab, urine and xray results.  Please go ahead and start the antibiotic, take with food to avoid upset stomach.  Follow-up if any symptoms worsen or persist.

## 2017-08-03 NOTE — Progress Notes (Signed)
Lisa Greene is a 77 y.o. female here for a new problem.  I acted as a Education administrator for [employer], PA-C Anselmo Pickler, LPN  History of Present Illness:   Chief Complaint  Patient presents with  . Urinary Tract Infection    Urinary Tract Infection   This is a new problem. Episode onset: Pt started with symptoms on Sunday. The problem occurs every urination. The problem has been unchanged. The quality of the pain is described as aching. The pain is at a severity of 3/10. The pain is mild. There has been no fever. She is not sexually active. There is a history of pyelonephritis. Associated symptoms include frequency. Pertinent negatives include no chills, discharge, hematuria, hesitancy, nausea, possible pregnancy, urgency or vomiting. Associated symptoms comments: Bilateral low back pain.. She has tried nothing for the symptoms. Her past medical history is significant for recurrent UTIs. There is no history of catheterization, kidney stones, a single kidney or a urological procedure.   Has been pushing fluids. Urine is clear. No vaginal discharge. Stage 0 breast cancer.   This is the 3rd time that she has dealt with this in the past 6 months. Each time her urine culture has been negative. She has this lower back pain each time. The lower back pain does resolve each time.  She does have chronic kidney disease stage III. Most recent labs are below: Lab Results  Component Value Date   BUN 12 08/03/2017   Lab Results  Component Value Date   CREATININE 1.05 08/03/2017     Past Medical History:  Diagnosis Date  . ALLERGIC RHINITIS 01/31/2008  . Allergy   . Asthma   . Breast cancer ([place])   . Breast cancer of lower-inner quadrant of left female breast ([place]) 11/01/2015  . Cataract   . Colon cancer screening 05/21/2014   No polyps at age 79. Diverticulosis alone-no more colonoscopies.    Marland Kitchen DIVERTICULITIS, HX OF 09/07/2008   pt denies this   . Eosinophilia 02/08/2009  . Fibroid   . Heart  murmur    slight per pt.   Marland Kitchen HYPERLIPIDEMIA 12/20/2006  . Hypertension   . Osteopenia   . OSTEOPOROSIS 12/20/2006  . Vitamin B 12 deficiency      Social History   Socioeconomic History  . Marital status: Married    Spouse name: Not on file  . Number of children: Not on file  . Years of education: Not on file  . Highest education level: Not on file  Social Needs  . Financial resource strain: Not on file  . Food insecurity - worry: Not on file  . Food insecurity - inability: Not on file  . Transportation needs - medical: Not on file  . Transportation needs - non-medical: Not on file  Occupational History  . Occupation: retired  Tobacco Use  . Smoking status: Never Smoker  . Smokeless tobacco: Never Used  Substance and Sexual Activity  . Alcohol use: No    Alcohol/week: 0.0 oz  . Drug use: No  . Sexual activity: Yes    Partners: Male    Birth control/protection: Post-menopausal  Other Topics Concern  . Not on file  Social History Narrative   Married. No kids- 2 step kids. 5 step grandkids.       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  BREAST LUMPECTOMY WITH NEEDLE LOCALIZATION Left 01/03/2016   Procedure: BREAST re-excision LUMPECTOMY WITH NEEDLE LOCALIZATION;  Surgeon: Rolm Bookbinder, MD;  Location: [facility];  Service: General;  Laterality: Left;  BREAST re-excision LUMPECTOMY WITH NEEDLE LOCALIZATION  . BREAST LUMPECTOMY WITH RADIOACTIVE SEED LOCALIZATION Left 11/28/2015   Procedure: LEFT BREAST LUMPECTOMY WITH BRACKETED RADIOACTIVE SEED LOCALIZATION;  Surgeon: Rolm Bookbinder, MD;  Location: [facility];  Service: General;  Laterality: Left;  . BREAST SURGERY     cysts removed x2  . CATARACT EXTRACTION BILATERAL W/ ANTERIOR VITRECTOMY  2013   bilateral cataracts  . COLONOSCOPY  2005   tics only   . KNEE  ARTHROSCOPY     left  . NEUROMA SURGERY     x2 feet  . thyroid duct cyst      Family History  Problem Relation Age of Onset  . COPD Father   . Heart disease Father        stent placed 75  . Brain cancer Mother   . Colon cancer Neg Hx   . Stomach cancer Neg Hx     Allergies  Allergen Reactions  . Lisinopril Other (See Comments) and Cough    Dry cough and stinging rash  . Latex Rash  . Penicillins Rash    Has patient had a PCN reaction causing immediate rash, facial/tongue/throat swelling, SOB or lightheadedness with hypotension: Yes Has patient had a PCN reaction causing severe rash involving mucus membranes or skin necrosis: No Has patient had a PCN reaction that required hospitalization No Has patient had a PCN reaction occurring within the last 10 years: No If all of the above answers are "NO", then may proceed with Cephalosporin use.   . Sulfonamide Derivatives Rash  . Tape Rash    Band-aids break out skin!!    Current Medications:   Current Outpatient Medications:  .  Albuterol Sulfate (PROAIR RESPICLICK) 778 (90 Base) MCG/ACT AEPB, Inhale 1 puff into the lungs every 8 (eight) hours as needed (wheezing/shortness of breath)., Disp: , Rfl:  .  amLODipine (NORVASC) 2.5 MG tablet, Take 1 tablet (2.5 mg total) by mouth daily., Disp: 30 tablet, Rfl: 3 .  anastrozole (ARIMIDEX) 1 MG tablet, Take 1 tablet (1 mg total) by mouth daily., Disp: 90 tablet, Rfl: 3 .  aspirin EC 81 MG tablet, Take 1 tablet (81 mg total) by mouth daily., Disp: 90 tablet, Rfl: 3 .  benzonatate (TESSALON) 200 MG capsule, Take 1 capsule (200 mg total) by mouth 2 (two) times daily as needed for cough., Disp: 20 capsule, Rfl: 0 .  cholecalciferol (VITAMIN D) 1000 UNITS tablet, Take 2,000 Units by mouth daily., Disp: , Rfl:  .  fluticasone (FLONASE) 50 MCG/ACT nasal spray, Place 1 spray into both nostrils daily as needed. , Disp: , Rfl:  .  fluticasone furoate-vilanterol (BREO ELLIPTA) 100-25 MCG/INH AEPB,  Inhale 1 puff into the lungs daily., Disp: , Rfl:  .  hydrochlorothiazide (HYDRODIURIL) 25 MG tablet, TAKE 1 TABLET BY MOUTH DAILY, Disp: 90 tablet, Rfl: 1 .  ipratropium (ATROVENT) 0.06 % nasal spray, Place 2 sprays into both nostrils 4 (four) times daily., Disp: 15 mL, Rfl: 0 .  montelukast (SINGULAIR) 10 MG tablet, Take 1 tablet by mouth at bedtime., Disp: , Rfl:  .  niacin 500 MG tablet, Take 1,000 mg by mouth at bedtime. , Disp: , Rfl:  .  Omega-3 Fatty Acids (FISH OIL) 1000 MG CAPS, Take 1,000 mg by mouth Nightly., Disp: ,  Rfl:  .  cephALEXin (KEFLEX) 500 MG capsule, Take 1 capsule (500 mg total) by mouth 2 (two) times daily for 7 days., Disp: 14 capsule, Rfl: 0 .  metoprolol succinate (TOPROL-XL) 50 MG 24 hr tablet, Take 1 tablet (50 mg total) by mouth daily. Take with or immediately following a meal., Disp: 90 tablet, Rfl: 3   Review of Systems:   Review of Systems  Constitutional: Negative for chills.  Gastrointestinal: Negative for nausea and vomiting.  Genitourinary: Positive for frequency. Negative for hematuria, hesitancy and urgency.    Vitals:   Vitals:   08/03/17 1112  BP: 110/70  Temp: 98.1 F (36.7 C)  TempSrc: Oral  SpO2: 99%  Weight: 154 lb 6.1 oz (70 kg)  Height: 5\' 2"  (1.575 m)     Body mass index is 28.24 kg/m.  Physical Exam:   Physical Exam  Constitutional: She appears well-developed. She is cooperative.  Non-toxic appearance. She does not have a sickly appearance. She does not appear ill. No distress.  Cardiovascular: Normal rate, regular rhythm, S1 normal, S2 normal, normal heart sounds and normal pulses.  No LE edema  Pulmonary/Chest: Effort normal and breath sounds normal.  Abdominal: Normal appearance and bowel sounds are normal. There is no tenderness. There is no CVA tenderness.  Musculoskeletal:  No decreased ROM 2/2 pain with flexion/extension, lateral side bends, or rotation. Reproducible tenderness with deep palpation to bilateral  paraspinal muscles. No bony tenderness. No evidence of erythema, rash or ecchymosis.    Neurological: She is alert. GCS eye subscore is 4. GCS verbal subscore is 5. GCS motor subscore is 6.  Skin: Skin is warm, dry and intact.  Psychiatric: She has a normal mood and affect. Her speech is normal and behavior is normal.  Nursing note and vitals reviewed.  Results for orders placed or performed in visit on 53/97/67  Basic metabolic panel  Result Value Ref Range   Sodium 124 (L) 135 - 145 mEq/L   Potassium 3.9 3.5 - 5.1 mEq/L   Chloride 89 (L) 96 - 112 mEq/L   CO2 30 19 - 32 mEq/L   Glucose, Bld 102 (H) 70 - 99 mg/dL   BUN 12 6 - 23 mg/dL   Creatinine, Ser 1.05 0.40 - 1.20 mg/dL   Calcium 9.2 8.4 - 10.5 mg/dL   GFR 54.01 (L) >60.00 mL/min  CBC with Differential/Platelet  Result Value Ref Range   WBC 8.0 4.0 - 10.5 K/uL   RBC 4.09 3.87 - 5.11 Mil/uL   Hemoglobin 12.8 12.0 - 15.0 g/dL   HCT 35.8 (L) 36.0 - 46.0 %   MCV 87.4 78.0 - 100.0 fl   MCHC 35.7 30.0 - 36.0 g/dL   RDW 13.1 11.5 - 15.5 %   Platelets 391.0 150.0 - 400.0 K/uL   Neutrophils Relative % 75.9 43.0 - 77.0 %   Lymphocytes Relative 11.8 (L) 12.0 - 46.0 %   Monocytes Relative 9.8 3.0 - 12.0 %   Eosinophils Relative 1.7 0.0 - 5.0 %   Basophils Relative 0.8 0.0 - 3.0 %   Neutro Abs 6.1 1.4 - 7.7 K/uL   Lymphs Abs 0.9 0.7 - 4.0 K/uL   Monocytes Absolute 0.8 0.1 - 1.0 K/uL   Eosinophils Absolute 0.1 0.0 - 0.7 K/uL   Basophils Absolute 0.1 0.0 - 0.1 K/uL  POCT urinalysis dipstick  Result Value Ref Range   Color, UA Yellow    Clarity, UA Clear    Glucose, UA Negative  Bilirubin, UA Negative    Ketones, UA Negative    Spec Grav, UA 1.015 1.010 - 1.025   Blood, UA Negative    pH, UA 6.5 5.0 - 8.0   Protein, UA Negative    Urobilinogen, UA 0.2 0.2 or 1.0 E.U./dL   Nitrite, UA Negative    Leukocytes, UA Negative Negative   Appearance     Odor     CLINICAL DATA:  Low back pain for 6 months. History of stage  0 breast cancer.  EXAM: LUMBAR SPINE - 2-3 VIEW  COMPARISON:  CT abdomen and pelvis 05/16/2016  FINDINGS: The ribs at T12 are small. There is partial sacralization of L5 on the left. Grade 1 anterolisthesis of L4 on L5 is similar to the prior CT and related to underlying facet disease. Vertebral body heights are preserved without evidence of fracture. There is mild disc space narrowing at L4-5. Minimal disc space narrowing and spurring are present at L1-2 and L2-3. Moderate to severe facet arthrosis is present at L3-4 and L4-5. Diffuse abdominal aortic atherosclerosis is noted.  IMPRESSION: 1. Mild multilevel lumbar disc degeneration without evidence of acute osseous abnormality. 2. Advanced L4-5 facet arthrosis with unchanged grade 1 anterolisthesis of L4 on L5.   Electronically Signed   By: Logan Bores M.D.   On: 08/03/2017 15:39  Assessment and Plan:    Lisa Greene was seen today for urinary tract infection.  Diagnoses and all orders for this visit:  Urinary frequency, Bilateral low back pain without sciatica, unspecified chronicity, CKD (chronic kidney disease), stage III (HCC) Urinalysis is negative. Will obtain urine culture. I'm also going to get some lab work today. Given the recurrence of low back pain, history of cancer, and her age I will get some imaging today as this has not yet been performed. I also want to go ahead and start Keflex, she is taking this without any issues in the past. Consider discontinuing the antibiotics if culture is negative. Labs reveal that kidney function has slightly improved. Back xray does not show any acute osseous abnormalities. -     POCT urinalysis dipstick -     Basic metabolic panel -     CBC with Differential/Platelet -     DG Lumbar Spine 2-3 Views -     Urine Culture  Other orders -     cephALEXin (KEFLEX) 500 MG capsule; Take 1 capsule (500 mg total) by mouth 2 (two) times daily for 7 days.    . Reviewed  expectations re: course of current medical issues. . Discussed self-management of symptoms. . Outlined signs and symptoms indicating need for more acute intervention. . Patient verbalized understanding and all questions were answered. . See orders for this visit as documented in the electronic medical record. . Patient received an After-Visit Summary.  CMA or LPN served as scribe during this visit. History, Physical, and Plan performed by medical provider. Documentation and orders reviewed and attested to.  Inda Coke, PA-C

## 2017-08-04 ENCOUNTER — Other Ambulatory Visit: Payer: Self-pay

## 2017-08-04 ENCOUNTER — Telehealth: Payer: Self-pay | Admitting: Physician Assistant

## 2017-08-04 LAB — URINE CULTURE
MICRO NUMBER:: 90281786
SPECIMEN QUALITY:: ADEQUATE

## 2017-08-04 MED ORDER — AMLODIPINE BESYLATE 5 MG PO TABS: 5.0000 mg | ORAL_TABLET | Freq: Every day | ORAL | 3 refills | Status: DC

## 2017-08-04 NOTE — Telephone Encounter (Signed)
Patient returning phone call about lab results. Contact patient.  Copied from [place]. Topic: General - Other >> Aug 04, 2017  3:11 PM Margot Ables wrote: Reason for CRM: pt states she missed a call from [facility], called office and advised Butch Penny called pt and is in a room. Advised Ms. Glendinning to have [place] call her back.

## 2017-08-04 NOTE — Telephone Encounter (Signed)
See result note.  

## 2017-08-05 ENCOUNTER — Other Ambulatory Visit: Payer: Self-pay | Admitting: *Deleted

## 2017-08-05 MED ORDER — HYDROCHLOROTHIAZIDE 12.5 MG PO TABS: 12.5000 mg | ORAL_TABLET | Freq: Every day | ORAL | 1 refills | Status: DC

## 2017-08-09 ENCOUNTER — Telehealth: Payer: Self-pay

## 2017-08-09 NOTE — Telephone Encounter (Signed)
Copied from [place]. Topic: Appointment Scheduling - Scheduling Inquiry for Clinic >> Aug 06, 2017  3:16 PM Synthia Innocent wrote: Reason for CRM: Patient called wanting to scheduled follow up on BP states she needs to be seen by the end of March with Dr Yong Channel. Work in? Please advise   Called patient and left a voicemail message asking patient to call office and schedule appointment with Dr. Yong Channel. Availability is limited. May be April before she can get on schedule

## 2017-08-13 ENCOUNTER — Other Ambulatory Visit: Payer: Self-pay

## 2017-08-13 ENCOUNTER — Telehealth: Payer: Self-pay

## 2017-08-13 DIAGNOSIS — E871 Hypo-osmolality and hyponatremia: Secondary | ICD-10-CM

## 2017-08-13 NOTE — Telephone Encounter (Signed)
Pt coming for repeat labs 08/17/17. Please place future orders. Thank you.

## 2017-08-13 NOTE — Telephone Encounter (Signed)
Order for BMP has been placed per lab result note from 08/03/2017.

## 2017-08-16 ENCOUNTER — Encounter: Payer: Self-pay | Admitting: Family Medicine

## 2017-08-16 ENCOUNTER — Ambulatory Visit (INDEPENDENT_AMBULATORY_CARE_PROVIDER_SITE_OTHER): Payer: Medicare HMO | Admitting: Family Medicine

## 2017-08-16 DIAGNOSIS — E871 Hypo-osmolality and hyponatremia: Secondary | ICD-10-CM | POA: Diagnosis not present

## 2017-08-16 DIAGNOSIS — J309 Allergic rhinitis, unspecified: Secondary | ICD-10-CM | POA: Diagnosis not present

## 2017-08-16 DIAGNOSIS — I10 Essential (primary) hypertension: Secondary | ICD-10-CM

## 2017-08-16 LAB — BASIC METABOLIC PANEL
BUN: 14 mg/dL (ref 6–23)
CHLORIDE: 98 meq/L (ref 96–112)
CO2: 27 meq/L (ref 19–32)
Calcium: 9.6 mg/dL (ref 8.4–10.5)
Creatinine, Ser: 1.3 mg/dL — ABNORMAL HIGH (ref 0.40–1.20)
GFR: 42.21 mL/min — ABNORMAL LOW (ref >60.00)
Glucose, Bld: 136 mg/dL — ABNORMAL HIGH (ref 70–99)
POTASSIUM: 4 meq/L (ref 3.5–5.1)
Sodium: 134 mEq/L — ABNORMAL LOW (ref 135–145)

## 2017-08-16 MED ORDER — IRBESARTAN 75 MG PO TABS: 75.0000 mg | ORAL_TABLET | Freq: Every day | ORAL | 11 refills | Status: DC

## 2017-08-16 NOTE — Progress Notes (Signed)
    Subjective:  Lisa Greene is a 77 y.o. female who presents today with a chief complaint of hypertension.   HPI:  Hypertension/hyponatremia Patient seen about 2 weeks ago for urinary tract symptoms.  She had blood work done at that time which showed hyponatremia to 124.  At that time, her blood pressure medications were changed.  Her losartan was stopped.  Her HCTZ was decreased from 25 to 12.5 and her amlodipine was increased from 2.5 to 5 mg.  She has been compliant with her metoprolol dose.  Patient still feels weak generally and has had worsening lower extremity swelling over the last couple of weeks which she attributes to the amlodipine.  No chest pain or shortness of breath.  Allergic rhinitis, established problem, worsening Symptoms have worsened over the past few days.  Patient takes Zyrtec daily.  She has had a little bit more runny nose little bit more throat irritation.  She additionally takes Singulair daily.  She has not been using her Flonase.  ROS: Per HPI  PMH: She reports that  has never smoked. she has never used smokeless tobacco. She reports that she does not drink alcohol or use drugs.   Objective:  Physical Exam: BP 132/78 (BP Location: Right Arm, Patient Position: Sitting, Cuff Size: Normal)   Pulse 72   Temp 98.3 F (36.8 C) (Oral)   Ht 5\' 2"  (1.575 m)   Wt 154 lb (69.9 kg)   SpO2 98%   BMI 28.17 kg/m   Gen: NAD, resting comfortably CV: RRR with occasional ectopic beat.  No murmurs appreciated Pulm: NWOB, CTAB with no crackles, wheezes, or rhonchi MSK: 1+ pitting edema to knees bilaterally.  Assessment/Plan:  Hypertension At goal today however patient is not tolerating her amlodipine and has had moderate hyponatremia on HCTZ.  Will stop both amlodipine and HCTZ today.  She has been on losartan in the past, however is concerned about recent recalls for this medication.  We will restart ARB with irbesartan today.  Advised patient to closely monitor  blood pressures at home and let us know if persistently elevated above 140/90.  She will follow-up with me or her PCP in the next 6-8 weeks.  Allergic rhinitis Continue Zyrtec and Flonase.  Encouraged patient to try over-the-counter Flonase to see if this helps with her symptoms.  Hyponatremia Likely diuretic induced.  We will completely stop her HCTZ today.  Recheck BMET.  Lisa Greene. Jerline Pain, MD 08/16/2017 2:52 PM

## 2017-08-16 NOTE — Assessment & Plan Note (Signed)
Continue Zyrtec and Flonase.  Encouraged patient to try over-the-counter Flonase to see if this helps with her symptoms.

## 2017-08-16 NOTE — Patient Instructions (Signed)
Stop the HCTZ and amlodipine.   Start the irbesartan.  Please keep an eye on your blood pressure and let us know if it is persistently 140/90 or higher.  We will check blood work today.  Take care, Dr Jerline Pain

## 2017-08-16 NOTE — Assessment & Plan Note (Signed)
At goal today however patient is not tolerating her amlodipine and has had moderate hyponatremia on HCTZ.  Will stop both amlodipine and HCTZ today.  She has been on losartan in the past, however is concerned about recent recalls for this medication.  We will restart ARB with irbesartan today.  Advised patient to closely monitor blood pressures at home and let us know if persistently elevated above 140/90.  She will follow-up with me or her PCP in the next 6-8 weeks.

## 2017-08-17 ENCOUNTER — Other Ambulatory Visit: Payer: Self-pay

## 2017-08-17 ENCOUNTER — Other Ambulatory Visit: Payer: Medicare HMO

## 2017-08-17 DIAGNOSIS — N183 Chronic kidney disease, stage 3 unspecified: Secondary | ICD-10-CM

## 2017-08-17 NOTE — Progress Notes (Signed)
Spoke with patient, gave results and made appt per Dr. Jerline Pain.

## 2017-08-24 ENCOUNTER — Ambulatory Visit: Payer: Medicare HMO | Admitting: Family Medicine

## 2017-08-24 ENCOUNTER — Encounter: Payer: Self-pay | Admitting: Family Medicine

## 2017-08-24 NOTE — Progress Notes (Signed)
Erroneous encounter.   Lisa Greene. Jerline Pain, MD 08/24/2017 3:35 PM

## 2017-08-26 ENCOUNTER — Other Ambulatory Visit: Payer: Self-pay | Admitting: Dermatology

## 2017-08-26 DIAGNOSIS — L723 Sebaceous cyst: Secondary | ICD-10-CM | POA: Diagnosis not present

## 2017-08-26 DIAGNOSIS — L720 Epidermal cyst: Secondary | ICD-10-CM | POA: Diagnosis not present

## 2017-08-30 ENCOUNTER — Other Ambulatory Visit (INDEPENDENT_AMBULATORY_CARE_PROVIDER_SITE_OTHER): Payer: Medicare HMO

## 2017-08-30 DIAGNOSIS — N183 Chronic kidney disease, stage 3 unspecified: Secondary | ICD-10-CM

## 2017-08-30 LAB — BASIC METABOLIC PANEL
BUN: 13 mg/dL (ref 6–23)
CHLORIDE: 104 meq/L (ref 96–112)
CO2: 27 mEq/L (ref 19–32)
Calcium: 9.2 mg/dL (ref 8.4–10.5)
Creatinine, Ser: 1.1 mg/dL (ref 0.40–1.20)
GFR: 51.17 mL/min — ABNORMAL LOW (ref >60.00)
Glucose, Bld: 147 mg/dL — ABNORMAL HIGH (ref 70–99)
POTASSIUM: 3.7 meq/L (ref 3.5–5.1)
Sodium: 139 mEq/L (ref 135–145)

## 2017-08-30 NOTE — Progress Notes (Signed)
Dr Marigene Ehlers interpretation of your lab work:  Your kidney function and other labs are all stable. We do not need to make any further adjustment to your medications or do any repeat testing at this time.    If you have any additional questions, please give [place] a call or send [place] a message through [place].  Take care, Dr Jerline Pain

## 2017-09-07 ENCOUNTER — Encounter: Payer: Self-pay | Admitting: Family Medicine

## 2017-09-07 ENCOUNTER — Ambulatory Visit (INDEPENDENT_AMBULATORY_CARE_PROVIDER_SITE_OTHER): Payer: Medicare HMO | Admitting: Family Medicine

## 2017-09-07 DIAGNOSIS — I10 Essential (primary) hypertension: Secondary | ICD-10-CM

## 2017-09-07 MED ORDER — IRBESARTAN 150 MG PO TABS: 150.0000 mg | ORAL_TABLET | Freq: Every day | ORAL | 11 refills | Status: DC

## 2017-09-07 NOTE — Progress Notes (Signed)
   Subjective:  Lisa Greene is a 77 y.o. female who presents today with a chief complaint of hypertension.   HPI:  Hypertension, established problem, Worsening Patient seen about 3 weeks ago for this.  At that time her amlodipine was stopped due to peripheral edema and her HCTZ was stopped due to hyponatremia.  She was started on irbesartan 75mg  daily.  She initially felt a little bit of fatigue with this however has tolerated well over the last couple of weeks.  No reported chest pain or shortness of breath.  Patient reports that she has increased her salt intake recently due to being told that she had low sodium.  She has additionally noticed a little bit more swelling in her legs over the past several days.  ROS: Per HPI  Objective:  Physical Exam: BP (!) 150/80 (BP Location: Right Arm)   Pulse 72   Temp 97.9 F (36.6 C) (Oral)   Resp 14   Ht 5\' 2"  (1.575 m)   Wt 154 lb (69.9 kg)   SpO2 96%   BMI 28.17 kg/m   Gen: NAD, resting comfortably CV: RRR with no murmurs appreciated Pulm: NWOB, CTAB with no crackles, wheezes, or rhonchi MSK: 1+ edema bilaterally to knees.  Assessment/Plan:  Hypertension Above goal today.  Of note, her blood pressure cuff read 147/78 while ours read 150/80.  We will increase her irbesartan to 150 mg daily.  She has increased her salt intake recently which is likely contributing to her elevated blood pressure reading as well as her lower extremity edema.  Advised her that she should cut back on sodium as much as possible and that her previous diagnosis of hyponatremia was likely due to her HCTZ and not dietary insufficiency.  She will come back to see me or her PCP in the next few weeks for a blood pressure recheck.  [name]. Jerline Pain, MD 09/07/2017 2:31 PM

## 2017-09-07 NOTE — Patient Instructions (Signed)
We will increase your irbesartan to 150mg  daily.  Please cut back on your salt intake.  Come back to see me or Dr Yong Channel in the next 2-3 weeks for a blood pressure recheck.  Take care, Dr Jerline Pain

## 2017-09-07 NOTE — Assessment & Plan Note (Signed)
Above goal today.  Of note, her blood pressure cuff read 147/78 while ours read 150/80.  We will increase her irbesartan to 150 mg daily.  She has increased her salt intake recently which is likely contributing to her elevated blood pressure reading as well as her lower extremity edema.  Advised her that she should cut back on sodium as much as possible and that her previous diagnosis of hyponatremia was likely due to her HCTZ and not dietary insufficiency.  She will come back to see me or her PCP in the next few weeks for a blood pressure recheck.

## 2017-09-20 DIAGNOSIS — S61412A Laceration without foreign body of left hand, initial encounter: Secondary | ICD-10-CM | POA: Diagnosis not present

## 2017-09-20 DIAGNOSIS — W01198A Fall on same level from slipping, tripping and stumbling with subsequent striking against other object, initial encounter: Secondary | ICD-10-CM | POA: Diagnosis not present

## 2017-09-20 DIAGNOSIS — S0292XA Unspecified fracture of facial bones, initial encounter for closed fracture: Secondary | ICD-10-CM | POA: Diagnosis not present

## 2017-09-20 DIAGNOSIS — S01511A Laceration without foreign body of lip, initial encounter: Secondary | ICD-10-CM | POA: Diagnosis not present

## 2017-09-20 DIAGNOSIS — S61411A Laceration without foreign body of right hand, initial encounter: Secondary | ICD-10-CM | POA: Diagnosis not present

## 2017-09-23 ENCOUNTER — Ambulatory Visit (INDEPENDENT_AMBULATORY_CARE_PROVIDER_SITE_OTHER): Payer: Medicare HMO

## 2017-09-23 ENCOUNTER — Ambulatory Visit: Payer: Medicare HMO | Admitting: Family Medicine

## 2017-09-23 ENCOUNTER — Encounter: Payer: Self-pay | Admitting: Family Medicine

## 2017-09-23 ENCOUNTER — Ambulatory Visit: Payer: Self-pay | Admitting: *Deleted

## 2017-09-23 ENCOUNTER — Ambulatory Visit (INDEPENDENT_AMBULATORY_CARE_PROVIDER_SITE_OTHER): Payer: Medicare HMO | Admitting: Family Medicine

## 2017-09-23 VITALS — BP 112/82 | HR 56 | Temp 98.4°F | Ht 62.0 in | Wt 154.2 lb

## 2017-09-23 DIAGNOSIS — R0789 Other chest pain: Secondary | ICD-10-CM

## 2017-09-23 DIAGNOSIS — M546 Pain in thoracic spine: Secondary | ICD-10-CM

## 2017-09-23 DIAGNOSIS — S61411A Laceration without foreign body of right hand, initial encounter: Secondary | ICD-10-CM | POA: Diagnosis not present

## 2017-09-23 DIAGNOSIS — Z23 Encounter for immunization: Secondary | ICD-10-CM

## 2017-09-23 DIAGNOSIS — J90 Pleural effusion, not elsewhere classified: Secondary | ICD-10-CM | POA: Diagnosis not present

## 2017-09-23 NOTE — Telephone Encounter (Signed)
Patient is seeing Dr. Yong Channel today.

## 2017-09-23 NOTE — Patient Instructions (Addendum)
Rule out rib fractures with x-rays today.  We should contact you no later than tomorrow with results  I would recommend taking either 1000 mg of Tylenol 3 times a day or 650 mg of Tylenol 4 times a day  I would also ice both affected areas 20 minutes 3-4 times a day  Please let me know if your pain is not controlled with this as I can call in some tramadol for you which is the lowest level narcotic.  Other options like ibuprofen, Aleve, Motrin are not the best choice with your kidney function.  Also update Td today

## 2017-09-23 NOTE — Progress Notes (Signed)
Subjective:  Lisa Greene is a 77 y.o. year old very pleasant female patient who presents for/with See problem oriented charting ROS-no shortness of breath.  No nausea or vomiting.  No increased fatigue.  Does have left-sided chest pain along mid axillary line along ribs.  Does have thoracic right back pain  Past Medical History-  Patient Active Problem List   Diagnosis Date Noted  . Breast cancer of lower-inner quadrant of left female breast ([place]) 11/01/2015    Priority: High  . Low vitamin B12 level 06/24/2016    Priority: Medium  . CKD (chronic kidney disease), stage III ([place]) 10/22/2015    Priority: Medium  . Asthma, moderate persistent, well-controlled 04/19/2014    Priority: Medium  . Hypertension 11/25/2010    Priority: Medium  . Hyperlipidemia 12/20/2006    Priority: Medium  . Family history of abdominal aortic aneurysm 03/08/2015    Priority: Low  . Eosinophilia 02/08/2009    Priority: Low  . Allergic rhinitis 01/31/2008    Priority: Low  . Osteopenia 12/20/2006    Priority: Low  . Frequent PVCs 04/14/2017  . Aortic atherosclerosis ([place]) 11/23/2016    Medications- reviewed and updated Current Outpatient Medications  Medication Sig Dispense Refill  . Albuterol Sulfate (PROAIR RESPICLICK) 361 (90 Base) MCG/ACT AEPB Inhale 1 puff into the lungs every 8 (eight) hours as needed (wheezing/shortness of breath).    Marland Kitchen anastrozole (ARIMIDEX) 1 MG tablet Take 1 tablet (1 mg total) by mouth daily. 90 tablet 3  . aspirin EC 81 MG tablet Take 1 tablet (81 mg total) by mouth daily. 90 tablet 3  . BREO ELLIPTA 200-25 MCG/INH AEPB     . cholecalciferol (VITAMIN D) 1000 UNITS tablet Take 2,000 Units by mouth daily.    Marland Kitchen doxycycline (VIBRA-TABS) 100 MG tablet Take 100 mg by mouth 2 (two) times daily.    . fluticasone (FLONASE) 50 MCG/ACT nasal spray Place 1 spray into both nostrils daily as needed.     . fluticasone furoate-vilanterol (BREO ELLIPTA) 100-25 MCG/INH AEPB Inhale 1 puff  into the lungs daily.    . irbesartan (AVAPRO) 150 MG tablet Take 1 tablet (150 mg total) by mouth daily. 30 tablet 11  . metoprolol succinate (TOPROL-XL) 50 MG 24 hr tablet Take 1 tablet (50 mg total) by mouth daily. Take with or immediately following a meal. 90 tablet 3  . montelukast (SINGULAIR) 10 MG tablet Take 1 tablet by mouth at bedtime.    . niacin 500 MG tablet Take 1,000 mg by mouth at bedtime.     . Omega-3 Fatty Acids (FISH OIL) 1000 MG CAPS Take 1,000 mg by mouth Nightly.     Objective: BP 112/82 (BP Location: Right Arm, Patient Position: Sitting, Cuff Size: Normal)   Pulse (!) 56   Temp 98.4 F (36.9 C) (Oral)   Ht 5\' 2"  (1.575 m)   Wt 154 lb 3.2 oz (69.9 kg)   SpO2 97%   BMI 28.20 kg/m  Gen: NAD, resting comfortably CV: RRR no murmurs rubs or gallops Chest wall: Tender along left mid axillary line and lower portion of the ribs. MSK: Tender along thoracic back to the right of midline along ribs.  Some associated muscle spasm in the area Lungs: CTAB no crackles, wheeze, rhonchi.  She grabs at her left chest when taking deep breaths. Abdomen: soft/nontender/nondistended/normal bowel sounds.  Ext: Trace edema Skin: warm, dry Neuro: Normal gait and speech  Assessment/Plan:  Left-sided chest wall pain - Plan: DG  Ribs Bilateral W/Chest, CANCELED: DG Ribs Unilateral Left Acute right-sided thoracic back pain - Plan: DG Ribs Bilateral W/Chest, CANCELED: DG Ribs Unilateral Right Laceration of right hand without foreign body, initial encounter- Plan: Td : Tetanus/diphtheria >7yo Preservative  free S: [place].  She tripped over a board that was sticking up- bottom half of her body fell on grass and top half of her body fell on the asphalt.  She essentially fell straight forward.  She has some soreness and pain in that area particularly when she takes a deep breath.  Has tried Tylenol which provides some relief.  She has tried a heating pad. she  denies  shortness of breath or trouble breathing. Sharp pain along left ribs and then had some pain in right mid back.   [place]- had x-rays of jaw. Old maxillary fracture was noted- thoguht to be from years ago- went through windshield years ago. 4 sutures under lip. Was placed on doxycycline due to extensive trauma.   She does have a history of osteopenia. A/P: from avs "Rule out rib fractures with x-rays today.  We should contact you no later than tomorrow with results  I would recommend taking either 1000 mg of Tylenol 3 times a day or 650 mg of Tylenol 4 times a day  I would also ice both affected areas 20 minutes 3-4 times a day  Please let me know if your pain is not controlled with this as I can call in some tramadol for you which is the lowest level narcotic.  Other options like ibuprofen, Aleve, Motrin are not the best choice with your kidney function.  Also update Td today" due to hand laceration and last Td over 5 years ago.  She should return to see [place] if she has new or worsening symptoms particularly shortness of breath or worsening pain or fever  Future Appointments  Date Time Provider [place]  09/23/2017  4:15 PM Marin Olp, MD [facility]  11/08/2017 10:00 AM Marin Olp, MD [facility]  11/25/2017 11:00 AM Williemae Area, RN [facility]  02/11/2018 10:00 AM Nicholas Lose, MD [facility] None   Lab/Order associations: Left-sided chest wall pain - Plan: DG Ribs Bilateral W/Chest, CANCELED: DG Ribs Unilateral Left  Acute right-sided thoracic back pain - Plan: DG Ribs Bilateral W/Chest, CANCELED: DG Ribs Unilateral Right  Laceration of right hand without foreign body, initial encounter - Plan: Td : Tetanus/diphtheria >7yo Preservative  free  Need for prophylactic vaccination with combined diphtheria-tetanus-pertussis (DTP) vaccine - Plan: CANCELED: Tdap vaccine greater than or equal to 7yo IM  Need for prophylactic  vaccination with tetanus toxoid alone - Plan: Td : Tetanus/diphtheria >7yo Preservative  free  Return precautions advised.  Garret Reddish, MD

## 2017-09-23 NOTE — Telephone Encounter (Signed)
  Pt fell at the beach on [place] and hit her left side and now has some soreness and pain when she takes a deep breath. Has taken tylenol and it helped a little. She denies resp distress. No fever and no break in skin or bruising to the left side. Per protocol, appointment made for this morning. [facility] at [facility] notified regarding appointment. Home care advice given to patient and pt knows to call back if symptoms worsens.   Reason for Disposition . [1] High-risk adult (e.g., age > 47, osteoporosis, chronic steroid use) AND [2] still hurts  Answer Assessment - Initial Assessment Questions 1. MECHANISM: "How did the injury happen?"     Fell at [employer], tripped on wood and hit the asphalt  2. ONSET: "When did the injury happen?" (Minutes or hours ago)     On Monday 3. LOCATION: "Where on the chest is the injury located?"     Left side under breast 4. APPEARANCE: "What does the injury look like?"     No bruising 5. BLEEDING: "Is there any bleeding now? If so, ask: How long has it been bleeding?"     no 6. SEVERITY: "Any difficulty with breathing?"     No, only sharp pain if taking a deep breath 7. SIZE: For cuts, bruises, or swelling, ask: "How large is it?" (e.g., inches or centimeters)    Bruising on the right breast, but not the left one 8. PAIN: "Is there pain?" If so, ask: "How bad is the pain?"   (e.g., Scale 1-10; or mild, moderate, severe)     Yes, pain #3 or 4 unless taking deep breath goes to #10 9. TETANUS: For any breaks in the skin, ask: "When was the last tetanus booster?"     no 10. PREGNANCY: "Is there any chance you are pregnant?" "When was your last menstrual period?"       no  Protocols used: CHEST INJURY-A-AH

## 2017-09-23 NOTE — Progress Notes (Signed)
Great news-no obvious rib fractures.  It does appear that you are not expanding your lungs very well after your fall.  I would recommend the breaths at least a few times an hour and breathe out through pursed lips to try to help expand the lungs again.  There was some slight fluid down at the lower portion of the lungs.  If you develop shortness of breath please return to see [place].  They did note that your heart size was slightly large.  If you were to have symptoms like chest pain or shortness of breath in the future-we could consider an echocardiogram to further evaluate

## 2017-09-24 ENCOUNTER — Telehealth: Payer: Self-pay

## 2017-09-24 NOTE — Telephone Encounter (Signed)
Called patient who viewed her MyChart message. She did not have any follow up questions. Patient verbalized understanding.

## 2017-10-08 ENCOUNTER — Ambulatory Visit: Payer: Self-pay | Admitting: *Deleted

## 2017-10-08 NOTE — Telephone Encounter (Signed)
Pt reports nosebleed this am 0500; right nare only. States severe at first, lasted "About 1 hour" total but decreasing in severity (mild) after 30 minutes. No clots present. Pt stopped epistaxis by pressure, no bleeding presently. Denies any dizziness,headache, congestion/cold symptoms. Unable to check B/P presently (out of town) Pt states "It's been good." Pt states had mild nosebleed 2 weeks ago after a fall; also right nare only. Seen by Dr. Yong Channel at that time. Care advise given per protocol. Pt is out of town, instructed to go to UC if symptoms worsen, bleeds increase in severity, frequency, dizziness occurs. Reason for Disposition . [1] Mild-moderate nosebleed AND [2] bleeding stopped now  Answer Assessment - Initial Assessment Questions 1. AMOUNT OF BLEEDING: "How bad is the bleeding?" "How much blood was lost?" "Has the bleeding stopped?"   - MILD: needed a couple tissues   - MODERATE: needed many tissues   - SEVERE: large blood clots, soaked many tissues, lasted more than 30 minutes      Severe at first, 1 hour; decreased in severity after 30 minutes, no clots 2. ONSET: "When did the nosebleed start?"     0500 this am 3. FREQUENCY: "How many nosebleeds have you had in the last 24 hours?"      one 4. RECURRENT SYMPTOMS: "Have there been other recent nosebleeds?" If so, ask: "How long did it take you to stop the bleeding?" "What worked best?"      2 weeks ago after fall, mild, right nare only 5. CAUSE: "What do you think caused this nosebleed?"     unsure 6. LOCAL FACTORS: "Do you have any cold symptoms?", "Have you been rubbing or picking at your nose?"    no 7. SYSTEMIC FACTORS: "Do you have high blood pressure or any bleeding problems?"     Yes, HTN 8. BLOOD THINNERS: "Do you take any blood thinners?" (e.g., coumadin, heparin, aspirin, Plavix)     Baby asa at night 9. OTHER SYMPTOMS: "Do you have any other symptoms?" (e.g., lightheadedness)     no  Protocols used:  NOSEBLEED-A-AH

## 2017-10-11 ENCOUNTER — Encounter: Payer: Self-pay | Admitting: Family Medicine

## 2017-10-11 ENCOUNTER — Ambulatory Visit (INDEPENDENT_AMBULATORY_CARE_PROVIDER_SITE_OTHER): Payer: Medicare HMO | Admitting: Family Medicine

## 2017-10-11 VITALS — BP 136/76 | HR 51 | Temp 98.5°F | Resp 14 | Ht 62.0 in | Wt 149.0 lb

## 2017-10-11 DIAGNOSIS — R928 Other abnormal and inconclusive findings on diagnostic imaging of breast: Secondary | ICD-10-CM | POA: Diagnosis not present

## 2017-10-11 DIAGNOSIS — R600 Localized edema: Secondary | ICD-10-CM

## 2017-10-11 DIAGNOSIS — Z853 Personal history of malignant neoplasm of breast: Secondary | ICD-10-CM | POA: Diagnosis not present

## 2017-10-11 DIAGNOSIS — R040 Epistaxis: Secondary | ICD-10-CM

## 2017-10-11 DIAGNOSIS — R350 Frequency of micturition: Secondary | ICD-10-CM

## 2017-10-11 LAB — HM MAMMOGRAPHY

## 2017-10-11 LAB — POCT URINALYSIS DIP (MANUAL ENTRY)
BILIRUBIN UA: NEGATIVE
BILIRUBIN UA: NEGATIVE mg/dL
Blood, UA: NEGATIVE
Glucose, UA: NEGATIVE mg/dL
Nitrite, UA: NEGATIVE
PH UA: 6 (ref 5.0–8.0)
Protein Ur, POC: NEGATIVE mg/dL
Spec Grav, UA: 1.015 (ref 1.010–1.025)
Urobilinogen, UA: 0.2 E.U./dL

## 2017-10-11 MED ORDER — CEPHALEXIN 500 MG PO CAPS: 500.0000 mg | ORAL_CAPSULE | Freq: Two times a day (BID) | ORAL | 0 refills | Status: AC

## 2017-10-11 NOTE — Patient Instructions (Signed)
It was nice to see you today!  I think you have a urinary tract infection.  Please start Keflex.  Please take 1 pill twice daily for the next week.  Please try using nasal saline to help with your nosebleeds.  Please let me or Dr. Yong Channel know if your symptoms return.  Please let me or Dr. Yong Channel know if your legs continue to swell over the next few weeks.  Take care, Dr Jerline Pain

## 2017-10-11 NOTE — Progress Notes (Signed)
    Subjective:  Lisa Greene is a 77 y.o. female who presents today for same-day appointment with a chief complaint of urinary frequency.   HPI:  Urinary Frequency, acute problem Started 2 days ago. Stable over that time.  Associated with some lower abdominal pain.  She has not tried any treatments for it.  No fevers or chills.  No back pain.  She tried drinking a lot of water.  Also associated with some increased urinary frequency.  Nosebleed, new problem Patient had one episode about a month ago.  She relates this to falling on asphalt.  Symptoms returned a few weeks ago while she was at the beach.  Lasted for a minute or 2 and then subsided while she was holding pressure.  No recent illnesses.  No cough or sneeze.  Does not use Flonase routinely.  Bleed was located in her right nostril.  ROS: Per HPI  PMH: She reports that she has never smoked. She has never used smokeless tobacco. She reports that she does not drink alcohol or use drugs.  Objective:  Physical Exam: BP 136/76   Pulse (!) 51   Temp 98.5 F (36.9 C)   Resp 14   Ht 5\' 2"  (1.575 m)   Wt 149 lb (67.6 kg)   SpO2 95%   BMI 27.25 kg/m   Gen: NAD, resting comfortably  HEENT: Nares clear bilaterally without obvious source of bleeding. CV: RRR with no murmurs appreciated Pulm: NWOB, CTAB with no crackles, wheezes, or rhonchi GI: Normal bowel sounds present. Soft, Nontender, Nondistended. MSK: No CVA tenderness.  Results for orders placed or performed in visit on 10/11/17 (from the past 24 hour(s))  POCT urinalysis dipstick     Status: Abnormal   Collection Time: 10/11/17  1:22 PM  Result Value Ref Range   Color, UA yellow yellow   Clarity, UA clear clear   Glucose, UA negative negative mg/dL   Bilirubin, UA negative negative   Ketones, POC UA negative negative mg/dL   Spec Grav, UA 1.015 1.010 - 1.025   Blood, UA negative negative   pH, UA 6.0 5.0 - 8.0   Protein Ur, POC negative negative mg/dL   Urobilinogen, UA 0.2 0.2 or 1.0 E.U./dL   Nitrite, UA Negative Negative   Leukocytes, UA Moderate (2+) (A) Negative   Assessment/Plan:  Urinary frequency UA and symptoms consistent with UTI.  No signs of systemic illness.  Start Keflex 500 mg twice daily for 7 days.  Will check urine culture.  Discussed reasons to return to care.  Epistaxis Possibly related to trauma from several weeks ago.  Her exam today is relatively benign.  Advised her to use saline nasal spray to see if this helps.  Discussed what to do in case her nosebleed returns including holding pressure for 30 minutes to an hour and seeking medical care for plates that do not resolve after an hour.  Also suggested over-the-counter Afrin for nosebleeds.  Lower extremity edema Patient notes that she has had more ankle swelling over the last few weeks after starting irbesartan.  She does not wish to make any medication changes today.  We will proceed with watchful waiting.  Advised her to follow-up with her PCP if symptoms worsen or do not improve over the next several weeks.  [name]. Jerline Pain, MD 10/11/2017 1:25 PM

## 2017-10-12 ENCOUNTER — Encounter: Payer: Self-pay | Admitting: Family Medicine

## 2017-10-13 ENCOUNTER — Other Ambulatory Visit: Payer: Self-pay

## 2017-10-13 DIAGNOSIS — R350 Frequency of micturition: Secondary | ICD-10-CM

## 2017-10-13 LAB — URINE CULTURE
MICRO NUMBER:: 90579924
SPECIMEN QUALITY: ADEQUATE

## 2017-10-13 MED ORDER — NITROFURANTOIN MONOHYD MACRO 100 MG PO CAPS: 100.0000 mg | ORAL_CAPSULE | Freq: Two times a day (BID) | ORAL | 0 refills | Status: AC

## 2017-10-20 DIAGNOSIS — I10 Essential (primary) hypertension: Secondary | ICD-10-CM | POA: Diagnosis not present

## 2017-11-08 ENCOUNTER — Ambulatory Visit: Payer: Medicare HMO | Admitting: *Deleted

## 2017-11-08 ENCOUNTER — Encounter: Payer: Self-pay | Admitting: Family Medicine

## 2017-11-08 ENCOUNTER — Ambulatory Visit (INDEPENDENT_AMBULATORY_CARE_PROVIDER_SITE_OTHER): Payer: Medicare HMO | Admitting: Family Medicine

## 2017-11-08 VITALS — BP 132/76 | HR 80 | Temp 99.5°F | Ht 62.0 in | Wt 148.6 lb

## 2017-11-08 DIAGNOSIS — I493 Ventricular premature depolarization: Secondary | ICD-10-CM

## 2017-11-08 DIAGNOSIS — N183 Chronic kidney disease, stage 3 unspecified: Secondary | ICD-10-CM

## 2017-11-08 DIAGNOSIS — I10 Essential (primary) hypertension: Secondary | ICD-10-CM | POA: Diagnosis not present

## 2017-11-08 DIAGNOSIS — E538 Deficiency of other specified B group vitamins: Secondary | ICD-10-CM | POA: Diagnosis not present

## 2017-11-08 DIAGNOSIS — M85851 Other specified disorders of bone density and structure, right thigh: Secondary | ICD-10-CM | POA: Diagnosis not present

## 2017-11-08 DIAGNOSIS — Z0000 Encounter for general adult medical examination without abnormal findings: Secondary | ICD-10-CM | POA: Diagnosis not present

## 2017-11-08 DIAGNOSIS — E785 Hyperlipidemia, unspecified: Secondary | ICD-10-CM

## 2017-11-08 DIAGNOSIS — J4540 Moderate persistent asthma, uncomplicated: Secondary | ICD-10-CM | POA: Diagnosis not present

## 2017-11-08 LAB — COMPREHENSIVE METABOLIC PANEL
ALT: 11 U/L (ref 0–35)
AST: 15 U/L (ref 0–37)
Albumin: 4.1 g/dL (ref 3.5–5.2)
Alkaline Phosphatase: 61 U/L (ref 39–117)
BILIRUBIN TOTAL: 0.7 mg/dL (ref 0.2–1.2)
BUN: 13 mg/dL (ref 6–23)
CALCIUM: 9.4 mg/dL (ref 8.4–10.5)
CO2: 25 mEq/L (ref 19–32)
Chloride: 103 mEq/L (ref 96–112)
Creatinine, Ser: 1.16 mg/dL (ref 0.40–1.20)
GFR: 48.11 mL/min — AB (ref >60.00)
Glucose, Bld: 105 mg/dL — ABNORMAL HIGH (ref 70–99)
Potassium: 4.6 mEq/L (ref 3.5–5.1)
Sodium: 137 mEq/L (ref 135–145)
Total Protein: 6.9 g/dL (ref 6.0–8.3)

## 2017-11-08 LAB — LIPID PANEL
Cholesterol: 213 mg/dL — ABNORMAL HIGH (ref 0–200)
HDL: 59.7 mg/dL (ref >39.00)
LDL Cholesterol: 135 mg/dL — ABNORMAL HIGH (ref 0–99)
NONHDL: 153.15
Total CHOL/HDL Ratio: 4
Triglycerides: 90 mg/dL (ref 0.0–149.0)
VLDL: 18 mg/dL (ref 0.0–40.0)

## 2017-11-08 LAB — CBC
HCT: 40.1 % (ref 36.0–46.0)
HEMOGLOBIN: 13.6 g/dL (ref 12.0–15.0)
MCHC: 33.9 g/dL (ref 30.0–36.0)
MCV: 89.4 fl (ref 78.0–100.0)
PLATELETS: 242 10*3/uL (ref 150.0–400.0)
RBC: 4.49 Mil/uL (ref 3.87–5.11)
RDW: 14 % (ref 11.5–15.5)
WBC: 6.1 10*3/uL (ref 4.0–10.5)

## 2017-11-08 LAB — VITAMIN B12: Vitamin B-12: 1078 pg/mL — ABNORMAL HIGH (ref 211–911)

## 2017-11-08 MED ORDER — IPRATROPIUM BROMIDE 0.06 % NA SOLN: 2.0000 | Freq: Four times a day (QID) | NASAL | 0 refills | Status: DC

## 2017-11-08 MED ORDER — PREDNISONE 20 MG PO TABS: ORAL_TABLET | ORAL | 0 refills | Status: DC

## 2017-11-08 NOTE — Assessment & Plan Note (Signed)
HTN- at goal with  irbesartan 150 mg (increase from last visit), metoprolol 50mg  XR. Had edema with amlodipine and on hctz she had mild hyponatremia.

## 2017-11-08 NOTE — Progress Notes (Signed)
Phone: [phone]  Subjective:  Patient presents today for their annual physical. Chief complaint-noted.   See problem oriented charting- ROS- full  review of systems was completed and negative except for: Cough, congestion, chills, sneezing, seasonal allergies, easy bruising, back pain  The following were reviewed and entered/updated in epic: Past Medical History:  Diagnosis Date  . ALLERGIC RHINITIS 01/31/2008  . Allergy   . Asthma   . Breast cancer ([place])   . Breast cancer of lower-inner quadrant of left female breast ([place]) 11/01/2015  . Cataract   . Colon cancer screening 05/21/2014   No polyps at age 4. Diverticulosis alone-no more colonoscopies.    Marland Kitchen DIVERTICULITIS, HX OF 09/07/2008   pt denies this   . Eosinophilia 02/08/2009  . Fibroid   . Heart murmur    slight per pt.   Marland Kitchen HYPERLIPIDEMIA 12/20/2006  . Hypertension   . Osteopenia   . OSTEOPOROSIS 12/20/2006  . Vitamin B 12 deficiency    Patient Active Problem List   Diagnosis Date Noted  . Frequent PVCs 04/14/2017    Priority: High  . Breast cancer of lower-inner quadrant of left female breast ([place]) 11/01/2015    Priority: High  . Low vitamin B12 level 06/24/2016    Priority: Medium  . CKD (chronic kidney disease), stage III ([place]) 10/22/2015    Priority: Medium  . Asthma, moderate persistent, well-controlled 04/19/2014    Priority: Medium  . Hypertension 11/25/2010    Priority: Medium  . Hyperlipidemia 12/20/2006    Priority: Medium  . Osteopenia 12/20/2006    Priority: Medium  . Family history of abdominal aortic aneurysm 03/08/2015    Priority: Low  . Eosinophilia 02/08/2009    Priority: Low  . Allergic rhinitis 01/31/2008    Priority: Low  . Aortic atherosclerosis ([place]) 11/23/2016   Past Surgical History:  Procedure Laterality Date  . BREAST LUMPECTOMY WITH NEEDLE LOCALIZATION Left 01/03/2016   Procedure: BREAST re-excision LUMPECTOMY WITH NEEDLE LOCALIZATION;  Surgeon: Rolm Bookbinder, MD;  Location:  [facility];  Service: General;  Laterality: Left;  BREAST re-excision LUMPECTOMY WITH NEEDLE LOCALIZATION  . BREAST LUMPECTOMY WITH RADIOACTIVE SEED LOCALIZATION Left 11/28/2015   Procedure: LEFT BREAST LUMPECTOMY WITH BRACKETED RADIOACTIVE SEED LOCALIZATION;  Surgeon: Rolm Bookbinder, MD;  Location: [facility];  Service: General;  Laterality: Left;  . BREAST SURGERY     cysts removed x2  . CATARACT EXTRACTION BILATERAL W/ ANTERIOR VITRECTOMY  2013   bilateral cataracts  . COLONOSCOPY  2005   tics only   . KNEE ARTHROSCOPY     left  . NEUROMA SURGERY     x2 feet  . thyroid duct cyst      Family History  Problem Relation Age of Onset  . COPD Father   . Heart disease Father        stent placed 69  . Brain cancer Mother   . Colon cancer Neg Hx   . Stomach cancer Neg Hx     Medications- reviewed and updated Current Outpatient Medications  Medication Sig Dispense Refill  . Albuterol Sulfate (PROAIR RESPICLICK) 937 (90 Base) MCG/ACT AEPB Inhale 1 puff into the lungs every 8 (eight) hours as needed (wheezing/shortness of breath).    Marland Kitchen anastrozole (ARIMIDEX) 1 MG tablet Take 1 tablet (1 mg total) by mouth daily. 90 tablet 3  . cholecalciferol (VITAMIN D) 1000 UNITS tablet Take 2,000 Units by mouth daily.    . fluticasone (FLONASE) 50 MCG/ACT nasal spray Place 1  spray into both nostrils daily as needed.     . fluticasone furoate-vilanterol (BREO ELLIPTA) 100-25 MCG/INH AEPB Inhale 1 puff into the lungs daily.    . irbesartan (AVAPRO) 150 MG tablet Take 1 tablet (150 mg total) by mouth daily. 30 tablet 11  . montelukast (SINGULAIR) 10 MG tablet Take 1 tablet by mouth at bedtime.    . niacin 500 MG tablet Take 1,000 mg by mouth at bedtime.     . Omega-3 Fatty Acids (FISH OIL) 1000 MG CAPS Take 1,000 mg by mouth Nightly.    Marland Kitchen ipratropium (ATROVENT) 0.06 % nasal spray Place 2 sprays into both nostrils 4 (four) times daily. 15 mL 0  . metoprolol succinate  (TOPROL-XL) 50 MG 24 hr tablet Take 1 tablet (50 mg total) by mouth daily. Take with or immediately following a meal. 90 tablet 3  . predniSONE (DELTASONE) 20 MG tablet Take 2 pills for 3 days, 1 pill for 4 days 10 tablet 0   No current facility-administered medications for this visit.     Allergies-reviewed and updated Allergies  Allergen Reactions  . Lisinopril Other (See Comments) and Cough    Dry cough and stinging rash  . Latex Rash  . Penicillins Rash    Has patient had a PCN reaction causing immediate rash, facial/tongue/throat swelling, SOB or lightheadedness with hypotension: Yes Has patient had a PCN reaction causing severe rash involving mucus membranes or skin necrosis: No Has patient had a PCN reaction that required hospitalization No Has patient had a PCN reaction occurring within the last 10 years: No If all of the above answers are "NO", then may proceed with Cephalosporin use.   . Sulfonamide Derivatives Rash  . Tape Rash    Band-aids break out skin!!    Social History   Social History Narrative   Married. No kids- 2 step kids. 5 step grandkids.       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 Oropharynx normal Neck: no thyromegaly CV: RRR no murmurs rubs or gallops Lungs: CTAB no crackles, wheeze, rhonchi Abdomen: soft/nontender/nondistended/normal bowel sounds.  Ext: no edema, 2+ PT pulses Skin: warm, dry Neuro: grossly normal, moves all extremities, PERRLA  Assessment/Plan:  77 y.o. female presenting for annual physical.  Health Maintenance counseling: 1. Anticipatory guidance: Patient counseled  regarding regular dental exams - goes yearly to clean studs for implants, eye exams - yearly, wearing seatbelts.  2. Risk factor reduction:  Advised patient of need for regular exercise and diet rich and fruits and vegetables to reduce risk of heart attack and stroke. Exercise- walking several days a week still. Diet-eating less after fall where hit jaw and couldn't eat as well- appetite has decreased.  Wt Readings from Last 3 Encounters:  11/08/17 148 lb 9.6 oz (67.4 kg)  10/11/17 149 lb (67.6 kg)  09/23/17 154 lb 3.2 oz (69.9 kg)  3. Immunizations/screenings/ancillary studies-- We discussed shingrix availability issues  as well as coverage issues (part D medicare)- I recommended that patient get vaccine at the pharmacy  Immunization History  Administered Date(s) Administered  . Influenza Whole 03/02/2011  . Pneumococcal Conjugate-13 01/19/2007, 04/19/2014, 06/06/2015  . Pneumococcal Polysaccharide-23 01/19/2007  . Td 07/02/2002, 09/23/2017  . Tdap 08/22/2012  . Zoster 02/10/2010  4. Cervical cancer screening- passed age based screening 5.  Left breast cancer history s/p lumpectomy and radiation for DCIS -  breast exam with oncology Dr. Lindi Adie and GYN and mammogram 10/11/2017 6. Colon cancer screening -10/09/2013 with no recall due to age 64. Skin cancer screening-sees Dr. Denna Haggard once a year.  advised regular sunscreen use. Denies worrisome, changing, or new skin lesions.  8. Birth control/STD check- postmenopausal-monogamous 9. Osteoporosis/osteopenia-see osteopenia section   Status of chronic or acute concerns   4-5 days cough, sore throat, sneezing, some chills, productive cough last night. Some sinus pressure. Getting clear discharge from nose. Taking zyrtec and singulair -Since she has an upcoming trip out of state this weekend we will be aggressive and give prednisone-this should also help prevent any asthmatic flare ups -  We will also refill ipratropium nasal spray which helped her  previously  Low vitamin B12 level Remains on oral B12 at this point.  Update B12 levels  Hyperlipidemia Dr. Tamala Julian of cardiology recommends LDL goal under 70 due to aortic atherosclerosis.  We will update lipids and consider statin.  I think statin is more effective at reducing cardiac risk compared to aspirin-we discussed stopping aspirin.  CKD (chronic kidney disease), stage III stable with GFR in 45 or above on last few checks- continue risk factor modification.  Avoid NSAIDs.  Frequent PVCs Frequent PVCs- saw Dr. Tamala Julian 12/16/2016- did holter monitor. Increased her metoprolol. Did echocardiogram. LDL goal under 70 due to atherosclerosis of aorta per him. She remains symptom free- he said if CP or DOE- would do ischemic evaluation per Dr. Tamala Julian   Hypertension HTN- at goal with  irbesartan 150 mg (increase from last visit), metoprolol 50mg  XR. Had edema with amlodipine and on hctz she had mild hyponatremia.   Asthma, moderate persistent, well-controlled Asthma- controlled on singulair for most part along with breo. Albuterol prn- rarely this summer out in heat when mowing grass  Osteopenia 06/06/2015 showed osteopenia but with elevated fracture risk-I suggested starting Fosamax she declined.  She was taking 1200 mg of calcium and 2000 units of vitamin D-she continues to take these.  We will update bone density  Future Appointments  Date Time Provider [place]  11/25/2017 11:00 AM Williemae Area, RN [facility]  02/11/2018 10:00 AM Nicholas Lose, MD [facility] None   No follow-ups on file.  Lab/Order associations: Preventative health care - Plan: DG Bone Density, CBC, Comprehensive metabolic panel, Lipid panel, Vitamin B12  Low vitamin B12 level - Plan: Vitamin B12  Hyperlipidemia, unspecified hyperlipidemia type - Plan: CBC, Comprehensive metabolic panel, Lipid panel  Osteopenia of neck of right femur - Plan: DG Bone Density  Meds ordered this encounter    Medications  . predniSONE (DELTASONE) 20 MG tablet    Sig: Take 2 pills for 3 days, 1 pill for 4 days    Dispense:  10 tablet    Refill:  0  . ipratropium (ATROVENT) 0.06 % nasal spray    Sig: Place 2 sprays into both nostrils 4 (four) times daily.    Dispense:  15 mL    Refill:  0    Return precautions advised.  Garret Reddish, MD

## 2017-11-08 NOTE — Progress Notes (Signed)
Your CBC was normal (blood counts, infection fighting cells, platelets). Your CMET was normal (kidney, liver, and electrolytes, blood sugar) for you- stable reduction in kidney function, at risk for diabetes. At risk for diabetes with blood sugar 105 (at risk from 100-125). Healthy eating, regular exercise, weight loss advised.  Your cholesterol has increased/is far too high given the plaque on your aorta. Lets start atorvastatin 20mg  daily #30 with 5 refills. Please send this in if she agrees. This may not be strong enough but I think its a reasonable starting point.  Your B 12 was normal

## 2017-11-08 NOTE — Assessment & Plan Note (Signed)
06/06/2015 showed osteopenia but with elevated fracture risk-I suggested starting Fosamax she declined.  She was taking 1200 mg of calcium and 2000 units of vitamin D-she continues to take these.  We will update bone density

## 2017-11-08 NOTE — Assessment & Plan Note (Signed)
Remains on oral B12 at this point.  Update B12 levels

## 2017-11-08 NOTE — Assessment & Plan Note (Signed)
stable with GFR in 45 or above on last few checks- continue risk factor modification.  Avoid NSAIDs.

## 2017-11-08 NOTE — Assessment & Plan Note (Signed)
Asthma- controlled on singulair for most part along with breo. Albuterol prn- rarely this summer out in heat when mowing grass

## 2017-11-08 NOTE — Assessment & Plan Note (Signed)
Frequent PVCs- saw Dr. Tamala Julian 12/16/2016- did holter monitor. Increased her metoprolol. Did echocardiogram. LDL goal under 70 due to atherosclerosis of aorta per him. She remains symptom free- he said if CP or DOE- would do ischemic evaluation per Dr. Tamala Julian

## 2017-11-08 NOTE — Patient Instructions (Addendum)
Please continue to check with your [facility] pharmacy to see if they have the shingrix vaccine. If they do- please get this immunization and update [place] by phone call or mychart with dates you receive the vaccine  Upper respiratory infection -Since she has an upcoming trip out of state this weekend we will be aggressive and give prednisone-this should also help prevent any asthmatic flare ups -  We will also refill ipratropium nasal spray which helped her previously - if sinus pressure persists into next week and sinus discharge turns yellow or green- we can call in antibiotic next week for you (hope this doesn't happen)  Schedule your bone density test at check out desk. You may also call directly to X-ray at [phone] to schedule an appointment that is convenient for you.  - located [address] across the [address] from [place] - in the basement - you do need an appointment for the bone density tests.    Please stop by lab before you go

## 2017-11-08 NOTE — Assessment & Plan Note (Signed)
Dr. Tamala Julian of cardiology recommends LDL goal under 70 due to aortic atherosclerosis.  We will update lipids and consider statin.  I think statin is more effective at reducing cardiac risk compared to aspirin-we discussed stopping aspirin.

## 2017-11-09 ENCOUNTER — Telehealth: Payer: Self-pay | Admitting: Family Medicine

## 2017-11-09 NOTE — Telephone Encounter (Signed)
Copied from [place] [id number]. Topic: Quick Communication - See Telephone Encounter >> Nov 09, 2017  2:46 PM Boyd Kerbs wrote: CRM for notification. See Telephone encounter for: 11/09/17.  Dr. Seward Grater pt with cholesterol medication and she is good with this.  She is asking if should keep taking niacin 500 MG tablet  Please send response to my chart.

## 2017-11-09 NOTE — Telephone Encounter (Signed)
You can tell her through my chart- I am fine with her continuing the niacin for now.  There is not a lot of great mortality data on niacin so if she wants to stop that is fine as well.

## 2017-11-09 NOTE — Telephone Encounter (Signed)
See note

## 2017-11-10 NOTE — Telephone Encounter (Signed)
My Chart message sent

## 2017-11-11 ENCOUNTER — Other Ambulatory Visit: Payer: Self-pay

## 2017-11-11 DIAGNOSIS — E785 Hyperlipidemia, unspecified: Secondary | ICD-10-CM

## 2017-11-11 MED ORDER — ATORVASTATIN CALCIUM 20 MG PO TABS: 20.0000 mg | ORAL_TABLET | Freq: Every day | ORAL | 5 refills | Status: DC

## 2017-11-18 ENCOUNTER — Ambulatory Visit: Payer: Medicare HMO | Admitting: *Deleted

## 2017-11-23 DIAGNOSIS — R69 Illness, unspecified: Secondary | ICD-10-CM | POA: Diagnosis not present

## 2017-11-25 ENCOUNTER — Ambulatory Visit: Payer: Medicare HMO | Admitting: *Deleted

## 2017-12-13 ENCOUNTER — Other Ambulatory Visit: Payer: Medicare HMO

## 2017-12-15 ENCOUNTER — Other Ambulatory Visit: Payer: Self-pay | Admitting: Interventional Cardiology

## 2017-12-16 ENCOUNTER — Telehealth: Payer: Self-pay | Admitting: Hematology and Oncology

## 2017-12-16 NOTE — Telephone Encounter (Signed)
Patient called to reschedule  °

## 2017-12-23 ENCOUNTER — Inpatient Hospital Stay: Admission: RE | Admit: 2017-12-23 | Payer: Medicare HMO | Source: Ambulatory Visit

## 2017-12-30 ENCOUNTER — Other Ambulatory Visit: Payer: Self-pay | Admitting: Family Medicine

## 2017-12-30 NOTE — Telephone Encounter (Signed)
Copied from [place] [id number]. Topic: Quick Communication - Rx Refill/Question >> Dec 30, 2017  2:18 PM Neva Seat wrote: metoprolol succinate (TOPROL-XL) 50 MG 24 hr tablet  Pt needing refills  [facility] [address] [place] [address] Phone: [phone] Fax: [phone]

## 2018-01-10 ENCOUNTER — Ambulatory Visit (INDEPENDENT_AMBULATORY_CARE_PROVIDER_SITE_OTHER)
Admission: RE | Admit: 2018-01-10 | Discharge: 2018-01-10 | Disposition: A | Discharge status: Home / Self Care | Payer: Medicare HMO | Source: Ambulatory Visit | Attending: Family Medicine | Admitting: Family Medicine

## 2018-01-10 DIAGNOSIS — M85851 Other specified disorders of bone density and structure, right thigh: Secondary | ICD-10-CM

## 2018-01-10 DIAGNOSIS — Z0000 Encounter for general adult medical examination without abnormal findings: Secondary | ICD-10-CM

## 2018-01-10 DIAGNOSIS — H26491 Other secondary cataract, right eye: Secondary | ICD-10-CM | POA: Diagnosis not present

## 2018-01-10 DIAGNOSIS — Z961 Presence of intraocular lens: Secondary | ICD-10-CM | POA: Diagnosis not present

## 2018-01-10 DIAGNOSIS — H524 Presbyopia: Secondary | ICD-10-CM | POA: Diagnosis not present

## 2018-01-12 ENCOUNTER — Encounter: Payer: Self-pay | Admitting: Family Medicine

## 2018-01-12 NOTE — Telephone Encounter (Signed)
This medication is prescribed by Dr. Daneen Schick with Cardiology. It was last refilled on 12/16/17 for 30 tablets. Per the note in the chart:  metoprolol succinate (TOPROL-XL) 50 MG 24 hr tablet 30 tablet 0 12/16/2017    Sig - Route: Take 1 tablet (50 mg total) by mouth daily. - Oral   Sent to pharmacy as: metoprolol succinate (TOPROL-XL) 50 MG 24 hr tablet   Notes to Pharmacy: Please call the office at [phone] to set up yearly appointment for further refills. Thank you. 1st attempt.    I called patient and left a voicemail message letting patient know and provided a call back number for questions.

## 2018-01-12 NOTE — Telephone Encounter (Signed)
Lisa Greene and Lisa Greene- we didn't get sent this message until 13 days after it was submitted to the [facility]- I was wondering if you could update [place] as to reason for delay.   In addition this is a medicine that cardiology prescribed most recently and they advised office visit- why wasn't this communicated to patient.   Cassie and Roselyn Reef- looks like patient is close to running out- I would rather fill under my name then her run out or have [facility] reach out to cardiology about short term refill and getting her in for cardiology follow up

## 2018-01-12 NOTE — Telephone Encounter (Signed)
Patient is calling for a update in regards to her medication refill. Please advise

## 2018-01-12 NOTE — Telephone Encounter (Signed)
See note

## 2018-01-13 NOTE — Telephone Encounter (Signed)
° ° °*  STAT* If patient is at the pharmacy, call can be transferred to refill team.   1. Which medications need to be refilled? (please list name of each medication and dose if known) Metoprolol 50 gm  2. Which pharmacy/location (including street and city if local pharmacy) is medication to be sent to? [place]  3. Do they need a 30 day or 90 day supply? 90  Patient states she about 6 pills left

## 2018-01-13 NOTE — Telephone Encounter (Signed)
Attempted to contact patient, no answer. I left a voicemail requesting she call our office back or check her MyChart message.

## 2018-01-14 MED ORDER — METOPROLOL SUCCINATE ER 50 MG PO TB24: 50.0000 mg | ORAL_TABLET | Freq: Every day | ORAL | 2 refills | Status: DC

## 2018-01-14 NOTE — Telephone Encounter (Signed)
Sharyn Lull and Lea- We didn't get this for 13 days from [facility] (refill request). Refill should have gone to cardiologist anyway as he is prescribing provider. I reached out to [facility] 2 days ago and heard nothing back. Our team went ahead and filled today. FYI

## 2018-01-14 NOTE — Telephone Encounter (Signed)
Pt's medication was sent to pt's pharmacy as requested. Confirmation received.  °

## 2018-01-24 DIAGNOSIS — D0512 Intraductal carcinoma in situ of left breast: Secondary | ICD-10-CM | POA: Diagnosis not present

## 2018-02-10 ENCOUNTER — Ambulatory Visit: Payer: Medicare HMO | Admitting: Hematology and Oncology

## 2018-02-11 ENCOUNTER — Ambulatory Visit: Payer: Medicare HMO | Admitting: Hematology and Oncology

## 2018-02-18 ENCOUNTER — Other Ambulatory Visit: Payer: Self-pay | Admitting: Hematology and Oncology

## 2018-02-22 ENCOUNTER — Encounter: Payer: Self-pay | Admitting: Family Medicine

## 2018-03-15 ENCOUNTER — Encounter: Payer: Self-pay | Admitting: Family Medicine

## 2018-03-15 ENCOUNTER — Ambulatory Visit (INDEPENDENT_AMBULATORY_CARE_PROVIDER_SITE_OTHER): Payer: Medicare HMO | Admitting: Family Medicine

## 2018-03-15 DIAGNOSIS — J4540 Moderate persistent asthma, uncomplicated: Secondary | ICD-10-CM | POA: Diagnosis not present

## 2018-03-15 MED ORDER — PREDNISONE 20 MG PO TABS: ORAL_TABLET | ORAL | 0 refills | Status: DC

## 2018-03-15 NOTE — Progress Notes (Signed)
Subjective:  Lisa Greene is a 77 y.o. year old very pleasant female patient who presents for/with See problem oriented charting ROS- no chest pain. No fever. Minimal cough. No edema.  Has experienced shortness of breath relieved by albuterol  Past Medical History-  Patient Active Problem List   Diagnosis Date Noted  . Frequent PVCs 04/14/2017    Priority: High  . Breast cancer of lower-inner quadrant of left female breast ([place]) 11/01/2015    Priority: High  . Low vitamin B12 level 06/24/2016    Priority: Medium  . CKD (chronic kidney disease), stage III ([place]) 10/22/2015    Priority: Medium  . Asthma, moderate persistent, well-controlled 04/19/2014    Priority: Medium  . Hypertension 11/25/2010    Priority: Medium  . Hyperlipidemia 12/20/2006    Priority: Medium  . Osteopenia 12/20/2006    Priority: Medium  . Family history of abdominal aortic aneurysm 03/08/2015    Priority: Low  . Eosinophilia 02/08/2009    Priority: Low  . Allergic rhinitis 01/31/2008    Priority: Low  . Aortic atherosclerosis ([place]) 11/23/2016    Medications- reviewed and updated Current Outpatient Medications  Medication Sig Dispense Refill  . Albuterol Sulfate (PROAIR RESPICLICK) 950 (90 Base) MCG/ACT AEPB Inhale 1 puff into the lungs every 8 (eight) hours as needed (wheezing/shortness of breath).    Marland Kitchen anastrozole (ARIMIDEX) 1 MG tablet TAKE ONE TABLET BY MOUTH DAILY 90 tablet 0  . atorvastatin (LIPITOR) 20 MG tablet Take 1 tablet (20 mg total) by mouth daily. 30 tablet 5  . Calcium 500-125 MG-UNIT TABS Take 1 tablet by mouth daily.    . cholecalciferol (VITAMIN D) 1000 UNITS tablet Take 2,000 Units by mouth daily.    . fluticasone (FLONASE) 50 MCG/ACT nasal spray Place 1 spray into both nostrils daily as needed.     . fluticasone furoate-vilanterol (BREO ELLIPTA) 100-25 MCG/INH AEPB Inhale 1 puff into the lungs daily.    Marland Kitchen ipratropium (ATROVENT) 0.06 % nasal spray Place 2 sprays into both nostrils 4  (four) times daily. 15 mL 0  . irbesartan (AVAPRO) 150 MG tablet Take 1 tablet (150 mg total) by mouth daily. 30 tablet 11  . metoprolol succinate (TOPROL-XL) 50 MG 24 hr tablet Take 1 tablet (50 mg total) by mouth daily. Please keep upcoming appt in October with Dr. Tamala Julian for future refills. Thank you 30 tablet 2  . montelukast (SINGULAIR) 10 MG tablet Take 1 tablet by mouth at bedtime.    . niacin 500 MG tablet Take 1,000 mg by mouth at bedtime.     . Omega-3 Fatty Acids (FISH OIL) 1000 MG CAPS Take 1,000 mg by mouth Nightly.    . predniSONE (DELTASONE) 20 MG tablet Take 2 pills for 3 days, 1 pill for 4 days 10 tablet 0   No current facility-administered medications for this visit.     Objective: BP 128/70 (BP Location: Left Arm, Patient Position: Sitting, Cuff Size: Large)   Pulse (!) 56   Temp 98.3 F (36.8 C) (Oral)   Ht 5\' 2"  (1.575 m)   Wt 150 lb 12.8 oz (68.4 kg)   SpO2 98%   BMI 27.58 kg/m  Gen: NAD, resting comfortably CV:  Bradycardic with occasional ectopic beats, no murmurs rubs or gallops Lungs: CTAB no crackles, wheeze, rhonchi (but has used albuterol recently) Abdomen: soft/nontender Ext: no edema Skin: warm, dry  Assessment/Plan:  Asthma, moderate persistent, well-controlled S: Has been having some issues- periods where she feel like  cant catch breath- uses her albuterol and it relieves symptoms. Initial concern was if it was the heart causing this but given relief with albuterol she felt much better.   Had issues starting Saturday when went to [place]- felt winded when getting off bus but didn't have inhaler. Symptoms seemed to improve with time thankfully. Doing ok on [employer] bike.   She felt short of breath about once a day over weekend. Also with laying down would feel smothered initially then would improve. Has had morning cough- coughing up some phlegm.   Using breo every morning. Prior to this weekend was using albuterol twice in two months. Called  Dr. Donneta Romberg- has appointment next month A/P: Appears to be mild asthma flare given improvement with albuterol.  We discussed watch and wait approach versus treating with prednisone-patient would prefer to treat with prednisone.  She has follow-up with Dr. Donneta Romberg next month and I encouraged her to update him on symptoms and need for prednisone- if does not have resolution without recurrent issues may need step up in therapy  Future Appointments  Date Time Provider [place]  03/22/2018  4:00 PM Belva Crome, MD [facility]  07/04/2018 10:00 AM Nicholas Lose, MD [facility] None   Meds ordered this encounter  Medications  . predniSONE (DELTASONE) 20 MG tablet    Sig: Take 2 pills for 3 days, 1 pill for 4 days    Dispense:  10 tablet    Refill:  0   Return precautions advised.  Garret Reddish, MD

## 2018-03-15 NOTE — Assessment & Plan Note (Signed)
S: Has been having some issues- periods where she feel like cant catch breath- uses her albuterol and it relieves symptoms. Initial concern was if it was the heart causing this but given relief with albuterol she felt much better.   Had issues starting Saturday when went to [place]- felt winded when getting off bus but didn't have inhaler. Symptoms seemed to improve with time thankfully. Doing ok [place] bike.   She felt short of breath about once a day over weekend. Also with laying down would feel smothered initially then would improve. Has had morning cough- coughing up some phlegm.   Using breo every morning. Prior to this weekend was using albuterol twice in two months. Called Dr. Donneta Romberg- has appointment next month A/P: Appears to be mild asthma flare given improvement with albuterol.  We discussed watch and wait approach versus treating with prednisone-patient would prefer to treat with prednisone.  She has follow-up with Dr. Donneta Romberg next month and I encouraged her to update him on symptoms and need for prednisone- if does not have resolution without recurrent issues may need step up in therapy

## 2018-03-15 NOTE — Patient Instructions (Signed)
Start prednisone for flare up of asthma. If not improving see [place] back or certainly if you had shortness of breath not responding to albuterol or you had new chest pain.

## 2018-03-21 NOTE — Progress Notes (Signed)
Cardiology Office Note:    Date:  03/22/2018   ID:  Lisa Greene, DOB 01-07-1941, MRN [id number]  PCP:  Marin Olp, MD  Cardiologist:  Sinclair Grooms, MD   Referring MD: Marin Olp, MD   Chief Complaint  Patient presents with  . Irregular Heart Beat    PVCs    History of Present Illness:    Lisa Greene is a 77 y.o. female with a hx of hyperlipidemia, aortic atherosclerosis, and relatively frequent PVCs.  She is physically active.  She voices no specific complaints other than noted in review of systems.  From cardiac standpoint she has not had syncope, denies palpitations, has not had tachycardia, no episodes of chest pain, denies dyspnea and lower extremity edema.  She sleeps well.  She has no exertional intolerance.  She denies claudication.  She is compliant with her medication regimen.  She denies transient neurological symptoms.  She is quite active in her daily activities not feeling limited at all.    Past Medical History:  Diagnosis Date  . ALLERGIC RHINITIS 01/31/2008  . Allergy   . Asthma   . Breast cancer ([place])   . Breast cancer of lower-inner quadrant of left female breast ([place]) 11/01/2015  . Cataract   . Colon cancer screening 05/21/2014   No polyps at age 34. Diverticulosis alone-no more colonoscopies.    Marland Kitchen DIVERTICULITIS, HX OF 09/07/2008   pt denies this   . Eosinophilia 02/08/2009  . Fibroid   . Heart murmur    slight per pt.   Marland Kitchen HYPERLIPIDEMIA 12/20/2006  . Hypertension   . Osteopenia   . OSTEOPOROSIS 12/20/2006  . Vitamin B 12 deficiency     Past Surgical History:  Procedure Laterality Date  . BREAST LUMPECTOMY WITH NEEDLE LOCALIZATION Left 01/03/2016   Procedure: BREAST re-excision LUMPECTOMY WITH NEEDLE LOCALIZATION;  Surgeon: Rolm Bookbinder, MD;  Location: [facility];  Service: General;  Laterality: Left;  BREAST re-excision LUMPECTOMY WITH NEEDLE LOCALIZATION  . BREAST LUMPECTOMY WITH RADIOACTIVE SEED  LOCALIZATION Left 11/28/2015   Procedure: LEFT BREAST LUMPECTOMY WITH BRACKETED RADIOACTIVE SEED LOCALIZATION;  Surgeon: Rolm Bookbinder, MD;  Location: [facility];  Service: General;  Laterality: Left;  . BREAST SURGERY     cysts removed x2  . CATARACT EXTRACTION BILATERAL W/ ANTERIOR VITRECTOMY  2013   bilateral cataracts  . COLONOSCOPY  2005   tics only   . KNEE ARTHROSCOPY     left  . NEUROMA SURGERY     x2 feet  . thyroid duct cyst      Current Medications: Current Meds  Medication Sig  . Albuterol Sulfate (PROAIR RESPICLICK) 144 (90 Base) MCG/ACT AEPB Inhale 1 puff into the lungs every 8 (eight) hours as needed (wheezing/shortness of breath).  Marland Kitchen anastrozole (ARIMIDEX) 1 MG tablet TAKE ONE TABLET BY MOUTH DAILY  . atorvastatin (LIPITOR) 20 MG tablet Take 1 tablet (20 mg total) by mouth daily.  . Calcium 500-125 MG-UNIT TABS Take 1 tablet by mouth daily.  . cholecalciferol (VITAMIN D) 1000 UNITS tablet Take 2,000 Units by mouth daily.  . fluticasone (FLONASE) 50 MCG/ACT nasal spray Place 1 spray into both nostrils daily as needed.   . fluticasone furoate-vilanterol (BREO ELLIPTA) 100-25 MCG/INH AEPB Inhale 1 puff into the lungs daily.  Marland Kitchen ipratropium (ATROVENT) 0.06 % nasal spray Place 2 sprays into both nostrils 4 (four) times daily.  . irbesartan (AVAPRO) 150 MG tablet Take 1 tablet (150 mg  total) by mouth daily.  . metoprolol succinate (TOPROL-XL) 50 MG 24 hr tablet Take 1 tablet (50 mg total) by mouth daily.  . montelukast (SINGULAIR) 10 MG tablet Take 1 tablet by mouth at bedtime.  . niacin 500 MG tablet Take 1,000 mg by mouth at bedtime.   . Omega-3 Fatty Acids (FISH OIL) 1000 MG CAPS Take 1,000 mg by mouth Nightly.  . [DISCONTINUED] metoprolol succinate (TOPROL-XL) 50 MG 24 hr tablet Take 1 tablet (50 mg total) by mouth daily. Please keep upcoming appt in October with Dr. Tamala Julian for future refills. Thank you     Allergies:   Lisinopril; Latex; Penicillins;  Sulfonamide derivatives; and Tape   Social History   Socioeconomic History  . Marital status: Married    Spouse name: Not on file  . Number of children: Not on file  . Years of education: Not on file  . Highest education level: Not on file  Occupational History  . Occupation: retired  Scientific laboratory technician  . Financial resource strain: Not on file  . Food insecurity:    Worry: Not on file    Inability: Not on file  . Transportation needs:    Medical: Not on file    Non-medical: Not on file  Tobacco Use  . Smoking status: Never Smoker  . Smokeless tobacco: Never Used  Substance and Sexual Activity  . Alcohol use: No    Alcohol/week: 0.0 standard drinks  . Drug use: No  . Sexual activity: Yes    Partners: Male    Birth control/protection: Post-menopausal  Lifestyle  . Physical activity:    Days per week: Not on file    Minutes per session: Not on file  . Stress: Not on file  Relationships  . Social connections:    Talks on phone: Not on file    Gets together: Not on file    Attends religious service: Not on file    Active member of club or organization: Not on file    Attends meetings of clubs or organizations: Not on file    Relationship status: Not on file  Other Topics Concern  . Not on file  Social History Narrative   Married. No kids- 2 step kids. 5 step grandkids.       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 There is no history of Colon cancer or Stomach cancer.  ROS:   Please see the history of present illness.    Muscle pain, easy bruising, otherwise no complaints.  All other systems reviewed and are negative.  EKGs/Labs/Other Studies Reviewed:    The following studies were reviewed today:  Holter monitor 12/07/2016: Study Highlights      NSR with  frequent multiform PVC's  27% of all activity is PVC   NSR with PVC's     HEART RATE EPISODES Minimum HR: 54 BPM at 2:54:42 AM(2) Maximum HR: 126 BPM at 11:34:19 AM Average HR: 80 BPM       EKG:  EKG is  ordered today.  The ekg ordered today demonstrates demonstrates normal sinus rhythm, low voltage in limb leads, quadrigeminy with interpolated PVCs, and otherwise no change when compared to prior.  Recent Labs: 11/08/2017: ALT 11; BUN 13; Creatinine, Ser 1.16; Hemoglobin 13.6; Platelets 242.0; Potassium  4.6; Sodium 137  Recent Lipid Panel    Component Value Date/Time   CHOL 213 (H) 11/08/2017 1010   TRIG 90.0 11/08/2017 1010   HDL 59.70 11/08/2017 1010   CHOLHDL 4 11/08/2017 1010   VLDL 18.0 11/08/2017 1010   LDLCALC 135 (H) 11/08/2017 1010   LDLDIRECT 134.0 02/25/2015 0854    Physical Exam:    VS:  BP (!) 152/78   Pulse 82   Ht 5\' 2"  (1.575 m)   Wt 150 lb 3.2 oz (68.1 kg)   BMI 27.47 kg/m     Wt Readings from Last 3 Encounters:  03/22/18 150 lb 3.2 oz (68.1 kg)  03/15/18 150 lb 12.8 oz (68.4 kg)  11/08/17 148 lb 9.6 oz (67.4 kg)     GEN:  Well nourished, well developed in no acute distress HEENT: Normal NECK: No JVD. LYMPHATICS: No lymphadenopathy CARDIAC: Irregular RR, no murmur, no gallop, no edema. VASCULAR: Radial pulses are 2+ as are the carotid and posterior tibial pulses.  No carotid bruits. RESPIRATORY:  Clear to auscultation without rales, wheezing or rhonchi  ABDOMEN: Soft, non-tender, non-distended, No pulsatile mass, MUSCULOSKELETAL: No deformity  SKIN: Warm and dry NEUROLOGIC:  Alert and oriented x 3 PSYCHIATRIC:  Normal affect   ASSESSMENT:    1. Aortic atherosclerosis ([place])   2. Essential hypertension   3. Premature ventricular contractions   4. Hyperlipidemia, unspecified hyperlipidemia type   5. CKD (chronic kidney disease), stage III ([place])    PLAN:    In order of problems listed above:  1. Primary prevention of cardiac events  with consideration of lipid-lowering less than 70.  Most recent LDL was 135.  Consider increasing statin therapy to high intensity, at least atorvastatin 40 mg/day. 2.  Blood pressure target 130/80 mmHg.  When repeated in the office today's value is 140/70 mmHg.  Low-salt diet is discussed. 3. Frequency PVCs were noted on 48-hour Holter monitoring done greater than a year ago.  Beta-blocker therapy was increased.  Symptoms resolved.  EKG currently demonstrates quad trigeminy.  LV function was normal.  We decided conservative medical management.  Untreated PVC burden was 27%. 4. LDL target should be less than 70.  He should be considered and atorvastatin increased to at least 40 mg/day 5. Not addressed.  We spent considerable time discussing secondary/primary risk prevention.  She has aortic atherosclerosis.  We discussed the imperative nature of blood pressure control, physical activity, LDL control with target of 70, moderate exertional activity per week (greater than 150 minutes/week), and surveillance for symptoms.  Kidney function and glucose are currently normal.  Beta-blocker therapy will be refilled.  On next office visit in 1 year or earlier if symptoms, repeat 48-hour Holter monitor to reevaluate PVC burden.  Greater than 50% of the time during this office visit was spent in education, counseling, and coordination of care related to underlying disease process and testing results as outlined.   Medication Adjustments/Labs and Tests Ordered: Current medicines are reviewed at length with the patient today.  Concerns regarding medicines are outlined above.  Orders Placed This Encounter  Procedures  . EKG 12-Lead   Meds ordered this encounter  Medications  . metoprolol succinate (TOPROL-XL) 50 MG 24 hr tablet    Sig: Take 1 tablet (50 mg total) by mouth daily.    Dispense:  90 tablet    Refill:  3    Patient Instructions  Medication Instructions:  Your physician recommends that you  continue on your current medications  as directed. Please refer to the Current Medication list given to you today.  If you need a refill on your cardiac medications before your next appointment, please call your pharmacy.   Lab work: None If you have labs (blood work) drawn today and your tests are completely normal, you will receive your results only by: Marland Kitchen MyChart Message (if you have MyChart) OR . A paper copy in the mail If you have any lab test that is abnormal or we need to change your treatment, we will call you to review the results.  Testing/Procedures: None  Follow-Up: At [facility], you and your health needs are our priority.  As part of our continuing mission to provide you with exceptional heart care, we have created designated Provider Care Teams.  These Care Teams include your primary Cardiologist (physician) and Advanced Practice Providers (APPs -  Physician Assistants and Nurse Practitioners) who all work together to provide you with the care you need, when you need it. You will need a follow up appointment in 12 months.  Please call our office 2 months in advance to schedule this appointment.  You may see Sinclair Grooms, MD or one of the following Advanced Practice Providers on your designated Care Team:   Truitt Merle, NP Cecilie Kicks, NP . Kathyrn Drown, NP  Any Other Special Instructions Will Be Listed Below (If Applicable).       Signed, Sinclair Grooms, MD  03/22/2018 5:38 PM    [place]

## 2018-03-22 ENCOUNTER — Ambulatory Visit: Payer: Medicare HMO | Admitting: Interventional Cardiology

## 2018-03-22 ENCOUNTER — Encounter: Payer: Self-pay | Admitting: Interventional Cardiology

## 2018-03-22 VITALS — BP 152/78 | HR 82 | Ht 62.0 in | Wt 150.2 lb

## 2018-03-22 DIAGNOSIS — I493 Ventricular premature depolarization: Secondary | ICD-10-CM | POA: Diagnosis not present

## 2018-03-22 DIAGNOSIS — I700 Atherosclerosis of aorta: Secondary | ICD-10-CM

## 2018-03-22 DIAGNOSIS — N183 Chronic kidney disease, stage 3 unspecified: Secondary | ICD-10-CM

## 2018-03-22 DIAGNOSIS — E785 Hyperlipidemia, unspecified: Secondary | ICD-10-CM

## 2018-03-22 DIAGNOSIS — I10 Essential (primary) hypertension: Secondary | ICD-10-CM | POA: Diagnosis not present

## 2018-03-22 MED ORDER — METOPROLOL SUCCINATE ER 50 MG PO TB24: 50.0000 mg | ORAL_TABLET | Freq: Every day | ORAL | 3 refills | Status: DC

## 2018-03-22 NOTE — Patient Instructions (Signed)

## 2018-03-23 ENCOUNTER — Encounter: Payer: Self-pay | Admitting: Physician Assistant

## 2018-03-23 ENCOUNTER — Ambulatory Visit (INDEPENDENT_AMBULATORY_CARE_PROVIDER_SITE_OTHER): Payer: Medicare HMO | Admitting: Physician Assistant

## 2018-03-23 VITALS — BP 134/82 | HR 90 | Temp 98.0°F | Resp 16 | Wt 149.0 lb

## 2018-03-23 DIAGNOSIS — R350 Frequency of micturition: Secondary | ICD-10-CM | POA: Diagnosis not present

## 2018-03-23 LAB — POCT URINALYSIS DIPSTICK
BILIRUBIN UA: NEGATIVE
Glucose, UA: NEGATIVE
KETONES UA: NEGATIVE
Nitrite, UA: NEGATIVE
PH UA: 6 (ref 5.0–8.0)
Protein, UA: NEGATIVE
Spec Grav, UA: 1.01 (ref 1.010–1.025)
UROBILINOGEN UA: 0.2 U/dL

## 2018-03-23 MED ORDER — NITROFURANTOIN MONOHYD MACRO 100 MG PO CAPS: 100.0000 mg | ORAL_CAPSULE | Freq: Two times a day (BID) | ORAL | 0 refills | Status: DC

## 2018-03-23 NOTE — Patient Instructions (Signed)
It was great to see you!  Start oral Macrobid for your symptoms. We will be in touch with your culture results.  Take care,  Lisa Coke PA-C   Urinary Tract Infection, Adult A urinary tract infection (UTI) is an infection of any part of the urinary tract. The urinary tract includes the:  Kidneys.  Ureters.  Bladder.  Urethra.  These organs make, store, and get rid of pee (urine) in the body. Follow these instructions at home:  Take over-the-counter and prescription medicines only as told by your doctor.  If you were prescribed an antibiotic medicine, take it as told by your doctor. Do not stop taking the antibiotic even if you start to feel better.  Avoid the following drinks: ? Alcohol. ? Caffeine. ? Tea. ? Carbonated drinks.  Drink enough fluid to keep your pee clear or pale yellow.  Keep all follow-up visits as told by your doctor. This is important.  Make sure to: ? Empty your bladder often and completely. Do not to hold pee for long periods of time. ? Empty your bladder before and after sex. ? Wipe from front to back after a bowel movement if you are female. Use each tissue one time when you wipe. Contact a doctor if:  You have back pain.  You have a fever.  You feel sick to your stomach (nauseous).  You throw up (vomit).  Your symptoms do not get better after 3 days.  Your symptoms go away and then come back. Get help right away if:  You have very bad back pain.  You have very bad lower belly (abdominal) pain.  You are throwing up and cannot keep down any medicines or water. This information is not intended to replace advice given to you by your health care provider. Make sure you discuss any questions you have with your health care provider. Document Released: 11/04/2007 Document Revised: 10/24/2015 Document Reviewed: 04/08/2015 Elsevier Interactive Patient Education  [employer].

## 2018-03-23 NOTE — Progress Notes (Signed)
Lisa Greene is a 77 y.o. female here for a new problem.  History of Present Illness:   Chief Complaint  Patient presents with  . Urinary Tract Infection    HPI   Symptoms started 3 weeks ago -- some pelvic pain and pressure in back. Gets UTI q 3-4 months and she seems to think that its from one of her cancer meds. Denies hx of kidney infection. Has not tried anything over the counter. Denies fever, n/v, chills. Denies vaginal discharge. No burning or stinging with urination. Trying to push fluids. States that the last medication she was on, Macrobid, helped her tremendously.  Past Medical History:  Diagnosis Date  . ALLERGIC RHINITIS 01/31/2008  . Allergy   . Asthma   . Breast cancer ()   . Breast cancer of lower-inner quadrant of left female breast ([place]) 11/01/2015  . Cataract   . Colon cancer screening 05/21/2014   No polyps at age 79. Diverticulosis alone-no more colonoscopies.    Marland Kitchen DIVERTICULITIS, HX OF 09/07/2008   pt denies this   . Eosinophilia 02/08/2009  . Fibroid   . Heart murmur    slight per pt.   Marland Kitchen HYPERLIPIDEMIA 12/20/2006  . Hypertension   . Osteopenia   . OSTEOPOROSIS 12/20/2006  . Vitamin B 12 deficiency      Social History   Socioeconomic History  . Marital status: Married    Spouse name: Not on file  . Number of children: Not on file  . Years of education: Not on file  . Highest education level: Not on file  Occupational History  . Occupation: retired  Scientific laboratory technician  . Financial resource strain: Not on file  . Food insecurity:    Worry: Not on file    Inability: Not on file  . Transportation needs:    Medical: Not on file    Non-medical: Not on file  Tobacco Use  . Smoking status: Never Smoker  . Smokeless tobacco: Never Used  Substance and Sexual Activity  . Alcohol use: No    Alcohol/week: 0.0 standard drinks  . Drug use: No  . Sexual activity: Yes    Partners: Male    Birth control/protection: Post-menopausal  Lifestyle  . Physical  activity:    Days per week: Not on file    Minutes per session: Not on file  . Stress: Not on file  Relationships  . Social connections:    Talks on phone: Not on file    Gets together: Not on file    Attends religious service: Not on file    Active member of club or organization: Not on file    Attends meetings of clubs or organizations: Not on file    Relationship status: Not on file  . Intimate partner violence:    Fear of current or ex partner: Not on file    Emotionally abused: Not on file    Physically abused: Not on file    Forced sexual activity: Not on file  Other Topics Concern  . Not on file  Social History Narrative   Married. No kids- 2 step kids. 5 step grandkids.       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 BREAST LUMPECTOMY WITH NEEDLE LOCALIZATION Left 01/03/2016   Procedure: BREAST re-excision LUMPECTOMY WITH NEEDLE LOCALIZATION;  Surgeon: Rolm Bookbinder, MD;  Location: [facility];  Service: General;  Laterality: Left;  BREAST re-excision LUMPECTOMY WITH NEEDLE LOCALIZATION  . BREAST LUMPECTOMY WITH RADIOACTIVE SEED LOCALIZATION Left 11/28/2015   Procedure: LEFT BREAST LUMPECTOMY WITH BRACKETED RADIOACTIVE SEED LOCALIZATION;  Surgeon: Rolm Bookbinder, MD;  Location: [facility];  Service: General;  Laterality: Left;  . BREAST SURGERY     cysts removed x2  . CATARACT EXTRACTION BILATERAL W/ ANTERIOR VITRECTOMY  2013   bilateral cataracts  . COLONOSCOPY  2005   tics only   . KNEE ARTHROSCOPY     left  . NEUROMA SURGERY     x2 feet  . thyroid duct cyst      Family History  Problem Relation Age of Onset  . COPD Father   . Heart disease Father        stent placed 44  . Brain cancer Mother   . Colon cancer Neg Hx   . Stomach cancer Neg Hx     Allergies  Allergen Reactions  . Lisinopril Other  (See Comments) and Cough    Dry cough and stinging rash  . Latex Rash  . Penicillins Rash    Has patient had a PCN reaction causing immediate rash, facial/tongue/throat swelling, SOB or lightheadedness with hypotension: Yes Has patient had a PCN reaction causing severe rash involving mucus membranes or skin necrosis: No Has patient had a PCN reaction that required hospitalization No Has patient had a PCN reaction occurring within the last 10 years: No If all of the above answers are "NO", then may proceed with Cephalosporin use.   . Sulfonamide Derivatives Rash  . Tape Rash    Band-aids break out skin!!    Current Medications:   Current Outpatient Medications:  .  Albuterol Sulfate (PROAIR RESPICLICK) 967 (90 Base) MCG/ACT AEPB, Inhale 1 puff into the lungs every 8 (eight) hours as needed (wheezing/shortness of breath)., Disp: , Rfl:  .  anastrozole (ARIMIDEX) 1 MG tablet, TAKE ONE TABLET BY MOUTH DAILY, Disp: 90 tablet, Rfl: 0 .  atorvastatin (LIPITOR) 20 MG tablet, Take 1 tablet (20 mg total) by mouth daily., Disp: 30 tablet, Rfl: 5 .  Calcium 500-125 MG-UNIT TABS, Take 1 tablet by mouth daily., Disp: , Rfl:  .  cholecalciferol (VITAMIN D) 1000 UNITS tablet, Take 2,000 Units by mouth daily., Disp: , Rfl:  .  fluticasone (FLONASE) 50 MCG/ACT nasal spray, Place 1 spray into both nostrils daily as needed. , Disp: , Rfl:  .  fluticasone furoate-vilanterol (BREO ELLIPTA) 100-25 MCG/INH AEPB, Inhale 1 puff into the lungs daily., Disp: , Rfl:  .  ipratropium (ATROVENT) 0.06 % nasal spray, Place 2 sprays into both nostrils 4 (four) times daily., Disp: 15 mL, Rfl: 0 .  irbesartan (AVAPRO) 150 MG tablet, Take 1 tablet (150 mg total) by mouth daily., Disp: 30 tablet, Rfl: 11 .  metoprolol succinate (TOPROL-XL) 50 MG 24 hr tablet, Take 1 tablet (50 mg total) by mouth daily., Disp: 90 tablet, Rfl: 3 .  montelukast (SINGULAIR) 10 MG tablet, Take 1 tablet by mouth at bedtime., Disp: , Rfl:  .  niacin  500 MG tablet, Take 1,000 mg by mouth at bedtime. , Disp: , Rfl:  .  Omega-3 Fatty Acids (FISH OIL) 1000 MG CAPS, Take 1,000 mg by mouth Nightly., Disp: , Rfl:  .  nitrofurantoin, macrocrystal-monohydrate, (MACROBID) 100 MG capsule, Take 1 capsule (100 mg total) by mouth 2 (two) times daily for 7 days., Disp: 14 capsule, Rfl: 0  Review of Systems:   ROS  Negative unless otherwise specified per HPI.  Vitals:   Vitals:   03/23/18 1045  BP: 134/82  Pulse: 90  Resp: 16  Temp: 98 F (36.7 C)  TempSrc: Oral  SpO2: 98%  Weight: 149 lb (67.6 kg)     Body mass index is 27.25 kg/m.  Physical Exam:   Physical Exam  Constitutional: She appears well-developed. She is cooperative.  Non-toxic appearance. She does not have a sickly appearance. She does not appear ill. No distress.  Cardiovascular: Normal rate, regular rhythm, S1 normal, S2 normal, normal heart sounds and normal pulses.  No LE edema  Pulmonary/Chest: Effort normal and breath sounds normal.  Abdominal: Normal appearance and bowel sounds are normal. There is tenderness in the suprapubic area. There is no CVA tenderness.  Neurological: She is alert. GCS eye subscore is 4. GCS verbal subscore is 5. GCS motor subscore is 6.  Skin: Skin is warm, dry and intact.  Psychiatric: She has a normal mood and affect. Her speech is normal and behavior is normal.  Nursing note and vitals reviewed.   Results for orders placed or performed in visit on 03/23/18  POCT urinalysis dipstick  Result Value Ref Range   Color, UA Yellow    Clarity, UA Cloudy    Glucose, UA Negative Negative   Bilirubin, UA Negative    Ketones, UA Negative    Spec Grav, UA 1.010 1.010 - 1.025   Blood, UA Trace    pH, UA 6.0 5.0 - 8.0   Protein, UA Negative Negative   Urobilinogen, UA 0.2 0.2 or 1.0 E.U./dL   Nitrite, UA Negative    Leukocytes, UA Large (3+) (A) Negative   Appearance     Odor       Assessment and Plan:    Deniqua was seen today for  urinary tract infection.  Diagnoses and all orders for this visit:  Frequent urination -     POCT urinalysis dipstick -     Cancel: Urine Culture -     Urine Culture  Other orders -     nitrofurantoin, macrocrystal-monohydrate, (MACROBID) 100 MG capsule; Take 1 capsule (100 mg total) by mouth 2 (two) times daily for 7 days.   Suspect UTI given symptoms. I did recommend that she follow-up if symptoms do not improve -- ER precautions provided. Macrobid per orders. Culture pending, will update treatment plan if needed.  . Reviewed expectations re: course of current medical issues. . Discussed self-management of symptoms. . Outlined signs and symptoms indicating need for more acute intervention. . Patient verbalized understanding and all questions were answered. . See orders for this visit as documented in the electronic medical record. . Patient received an After-Visit Summary.   Inda Coke, PA-C

## 2018-03-25 LAB — URINE CULTURE
MICRO NUMBER: 91275359
SPECIMEN QUALITY: ADEQUATE

## 2018-03-30 DIAGNOSIS — R69 Illness, unspecified: Secondary | ICD-10-CM | POA: Diagnosis not present

## 2018-03-31 ENCOUNTER — Telehealth: Payer: Self-pay | Admitting: Family Medicine

## 2018-03-31 NOTE — Telephone Encounter (Signed)
Can you set up a repeat ua and urine culture under (I assume polyuria?- confirm from last note)? I would be willing to re-prescribe based on results.   If she would prefer- looks like we have 3 same day visits as well if she wants to sit down and discuss (and we still have openings by time you call)

## 2018-03-31 NOTE — Telephone Encounter (Signed)
See note  Copied from [place] [id number]. Topic: General - Other >> Mar 31, 2018 10:38 AM Ivar Drape wrote: Reason for CRM:   Patient saw a provider last week and was diagnosed with a UTI, and she was treated with an antibiotic and was told if she didn't get better to let the provider know.  The patient is experiencing the same symptoms just not as bad as before.  Please advise.

## 2018-03-31 NOTE — Telephone Encounter (Signed)
Please advise. Sam prescribed her Macrobid. Urine culture was done.

## 2018-04-01 ENCOUNTER — Other Ambulatory Visit (INDEPENDENT_AMBULATORY_CARE_PROVIDER_SITE_OTHER): Payer: Medicare HMO

## 2018-04-01 ENCOUNTER — Other Ambulatory Visit: Payer: Self-pay | Admitting: Family Medicine

## 2018-04-01 DIAGNOSIS — R358 Other polyuria: Secondary | ICD-10-CM

## 2018-04-01 DIAGNOSIS — R3589 Other polyuria: Secondary | ICD-10-CM

## 2018-04-01 LAB — POC URINALSYSI DIPSTICK (AUTOMATED)
BILIRUBIN UA: NEGATIVE
Blood, UA: NEGATIVE
Glucose, UA: NEGATIVE
KETONES UA: NEGATIVE
Nitrite, UA: NEGATIVE
PROTEIN UA: NEGATIVE
SPEC GRAV UA: 1.015 (ref 1.010–1.025)
Urobilinogen, UA: 0.2 E.U./dL
pH, UA: 6 (ref 5.0–8.0)

## 2018-04-01 MED ORDER — NITROFURANTOIN MONOHYD MACRO 100 MG PO CAPS: 100.0000 mg | ORAL_CAPSULE | Freq: Two times a day (BID) | ORAL | 0 refills | Status: DC

## 2018-04-01 NOTE — Telephone Encounter (Signed)
See note

## 2018-04-01 NOTE — Telephone Encounter (Signed)
It appears that orders have been entered and pt has already had LAB visit today.

## 2018-04-01 NOTE — Telephone Encounter (Signed)
Pt called back and scheduled for lab visit today 11/1 2:00pm. Please enter orders as needed based on notes.

## 2018-04-02 LAB — URINE CULTURE
MICRO NUMBER:: 91317639
SPECIMEN QUALITY:: ADEQUATE

## 2018-04-07 DIAGNOSIS — R69 Illness, unspecified: Secondary | ICD-10-CM | POA: Diagnosis not present

## 2018-04-08 ENCOUNTER — Ambulatory Visit (INDEPENDENT_AMBULATORY_CARE_PROVIDER_SITE_OTHER): Payer: Medicare HMO | Admitting: Family Medicine

## 2018-04-08 ENCOUNTER — Encounter: Payer: Self-pay | Admitting: Family Medicine

## 2018-04-08 VITALS — BP 124/66 | HR 60 | Temp 97.6°F | Ht 62.0 in | Wt 148.8 lb

## 2018-04-08 DIAGNOSIS — R102 Pelvic and perineal pain: Secondary | ICD-10-CM

## 2018-04-08 DIAGNOSIS — R5383 Other fatigue: Secondary | ICD-10-CM | POA: Diagnosis not present

## 2018-04-08 LAB — CBC WITH DIFFERENTIAL/PLATELET
Basophils Absolute: 0.1 10*3/uL (ref 0.0–0.1)
Basophils Relative: 1 % (ref 0.0–3.0)
EOS PCT: 6.9 % — AB (ref 0.0–5.0)
Eosinophils Absolute: 0.4 10*3/uL (ref 0.0–0.7)
HEMATOCRIT: 38.1 % (ref 36.0–46.0)
Hemoglobin: 13 g/dL (ref 12.0–15.0)
LYMPHS ABS: 1.5 10*3/uL (ref 0.7–4.0)
LYMPHS PCT: 23.7 % (ref 12.0–46.0)
MCHC: 34 g/dL (ref 30.0–36.0)
MCV: 88 fl (ref 78.0–100.0)
MONOS PCT: 10.6 % (ref 3.0–12.0)
Monocytes Absolute: 0.7 10*3/uL (ref 0.1–1.0)
Neutro Abs: 3.6 10*3/uL (ref 1.4–7.7)
Neutrophils Relative %: 57.8 % (ref 43.0–77.0)
Platelets: 382 10*3/uL (ref 150.0–400.0)
RBC: 4.33 Mil/uL (ref 3.87–5.11)
RDW: 13.3 % (ref 11.5–15.5)
WBC: 6.3 10*3/uL (ref 4.0–10.5)

## 2018-04-08 LAB — POC URINALSYSI DIPSTICK (AUTOMATED)
Bilirubin, UA: NEGATIVE
Blood, UA: NEGATIVE
Glucose, UA: NEGATIVE
Ketones, UA: NEGATIVE
NITRITE UA: NEGATIVE
PROTEIN UA: NEGATIVE
Spec Grav, UA: 1.01 (ref 1.010–1.025)
UROBILINOGEN UA: 0.2 U/dL
pH, UA: 6.5 (ref 5.0–8.0)

## 2018-04-08 LAB — TSH: TSH: 4.18 u[IU]/mL (ref 0.35–4.50)

## 2018-04-08 LAB — BASIC METABOLIC PANEL
BUN: 18 mg/dL (ref 6–23)
CHLORIDE: 101 meq/L (ref 96–112)
CO2: 27 meq/L (ref 19–32)
Calcium: 9.3 mg/dL (ref 8.4–10.5)
Creatinine, Ser: 1.22 mg/dL — ABNORMAL HIGH (ref 0.40–1.20)
GFR: 45.34 mL/min — ABNORMAL LOW (ref >60.00)
GLUCOSE: 100 mg/dL — AB (ref 70–99)
POTASSIUM: 4.5 meq/L (ref 3.5–5.1)
SODIUM: 135 meq/L (ref 135–145)

## 2018-04-08 NOTE — Addendum Note (Signed)
Addended by: Kayren Eaves T on: 04/08/2018 08:50 AM   Modules accepted: Orders

## 2018-04-08 NOTE — Patient Instructions (Signed)
Lets check another urine just to be on the safe side.  Given your fatigue but also check some blood work  Please stop by lab before you go  If your symptoms worsen or persist in 7 to 14 days and we do not find an answer based on the lab work-I would like to see you back in and do a pelvic exam and test of vaginal discharge even though it has not increased.  If that does not show a cause and your pain continues, would consider CT scan.  We are trying to hold off on the scan for now since her symptoms are mild to try to avoid the radiation but even mild symptoms that persist for a long time are worth investigating further.

## 2018-04-08 NOTE — Progress Notes (Deleted)
gold 

## 2018-04-08 NOTE — Progress Notes (Addendum)
Subjective:  Lisa Greene is a 77 y.o. year old very pleasant female patient who presents for/with See problem oriented charting ROS-no fevers, chills, nausea/vomiting, or recent weight change    Past Medical History-  Patient Active Problem List   Diagnosis Date Noted  . Frequent PVCs 04/14/2017    Priority: High  . Breast cancer of lower-inner quadrant of left female breast ([place]) 11/01/2015    Priority: High  . Low vitamin B12 level 06/24/2016    Priority: Medium  . CKD (chronic kidney disease), stage III ([place]) 10/22/2015    Priority: Medium  . Asthma, moderate persistent, well-controlled 04/19/2014    Priority: Medium  . Hypertension 11/25/2010    Priority: Medium  . Hyperlipidemia 12/20/2006    Priority: Medium  . Osteopenia 12/20/2006    Priority: Medium  . Family history of abdominal aortic aneurysm 03/08/2015    Priority: Low  . Eosinophilia 02/08/2009    Priority: Low  . Allergic rhinitis 01/31/2008    Priority: Low  . Aortic atherosclerosis ([place]) 11/23/2016    Medications- reviewed and updated Current Outpatient Medications  Medication Sig Dispense Refill  . Albuterol Sulfate (PROAIR RESPICLICK) 938 (90 Base) MCG/ACT AEPB Inhale 1 puff into the lungs every 8 (eight) hours as needed (wheezing/shortness of breath).    Marland Kitchen anastrozole (ARIMIDEX) 1 MG tablet TAKE ONE TABLET BY MOUTH DAILY 90 tablet 0  . atorvastatin (LIPITOR) 20 MG tablet Take 1 tablet (20 mg total) by mouth daily. 30 tablet 5  . Calcium 500-125 MG-UNIT TABS Take 1 tablet by mouth daily.    . cholecalciferol (VITAMIN D) 1000 UNITS tablet Take 2,000 Units by mouth daily.    . fluticasone (FLONASE) 50 MCG/ACT nasal spray Place 1 spray into both nostrils daily as needed.     . fluticasone furoate-vilanterol (BREO ELLIPTA) 100-25 MCG/INH AEPB Inhale 1 puff into the lungs daily.    . irbesartan (AVAPRO) 150 MG tablet Take 1 tablet (150 mg total) by mouth daily. 30 tablet 11  . metoprolol succinate  (TOPROL-XL) 50 MG 24 hr tablet Take 1 tablet (50 mg total) by mouth daily. 90 tablet 3  . montelukast (SINGULAIR) 10 MG tablet Take 1 tablet by mouth at bedtime.    . niacin 500 MG tablet Take 1,000 mg by mouth at bedtime.     . Omega-3 Fatty Acids (FISH OIL) 1000 MG CAPS Take 1,000 mg by mouth Nightly.     No current facility-administered medications for this visit.     Objective: BP 124/66 (BP Location: Left Arm, Patient Position: Sitting, Cuff Size: Large)   Pulse 60   Temp 97.6 F (36.4 C) (Oral)   Ht 5\' 2"  (1.575 m)   Wt 148 lb 12.8 oz (67.5 kg)   SpO2 97%   BMI 27.22 kg/m  Gen: NAD, resting comfortably CV: RRR no murmurs rubs or gallops Lungs: CTAB no crackles, wheeze, rhonchi Abdomen: soft/tender across RLQ, suprapubic and LLQ area- no idscrete tenderness/nondistended/normal bowel sounds. No rebound or guarding.  Ext: no edema Skin: warm, dry  Assessment/Plan:  Suprapubic pain - Plan: POCT Urinalysis Dipstick (Automated), Urine Culture  Fatigue, unspecified type - Plan: CBC with Differential/Platelet, Basic metabolic panel, TSH S: Patient was seen on 03/23/2018 by Inda Coke- she was having some pelvic pain and pressure at that time.  She was started on Macrobid for likely UTI.  Urine culture confirmed UT Iwith 50-100,000 E. coli that was sensitive to nitrofurantoin.  Patient stated she did not have improvement of  her symptoms on treatment.  We got a updated urine culture. We reached out to check on patient on 04/04/2018 when her urine culture on 04/01/2018 which showed no growth.  Patient stated she was continuing to hurt in her groin and low back and that the pain was worsening so we called her for follow-up. Today, states has some very mild soreness in low back like shes been bending over a lot but hasnt been. Has pain in lower abdomen- suprapubic, LLQ, and RLQ-this is about 2 on 10 pain.  Pain was worse before UTI treatment and then when she was off antibiotics  stabilized and is failed to continue to improve.  Urine somewhat foamy. No blood in the urine. No dysuria, polyuria, urinary urgency. She feels like symptoms started after arimidex was started.   Daily bowel movements- may even go 3-4x a day. No vaginal pain. Slight vaginal discharge on arimidex.   Last weekend had a cold. She has some ongoing fatigue since then. She also wonders if it was more asthma flare as had used strong odor cleaner in carpet A/P: 77 year old female with bandlike pain across lower abdomen and some fatigue of unclear cause.  Her pain has improved with prior UTI treatment-follow-up urine culture was negative.  We will repeat UA and urine culture again today.  She asks about kidney stones-we will look for any blood in the urine but I suspect her pain would be more significant if it was a kidney stone.  We will also get some baseline labs given her fatigue.  Some light unchanged vaginal discharge since being on Arimidex-we discussed doing a vaginal exam and testing for bacterial vaginosis and Candida if symptoms fail to improve and labs are unrevealing.  Next step after that would be to consider CT scan.  See after visit instructions  Future Appointments  Date Time Provider [place]  07/04/2018 10:00 AM Nicholas Lose, MD [facility] None   Lab/Order associations: Suprapubic pain - Plan: POCT Urinalysis Dipstick (Automated), Urine Culture  Fatigue, unspecified type - Plan: CBC with Differential/Platelet, Basic metabolic panel, TSH  Return precautions advised.  Garret Reddish, MD

## 2018-04-11 LAB — URINE CULTURE
MICRO NUMBER:: 91348325
SPECIMEN QUALITY: ADEQUATE

## 2018-04-11 MED ORDER — NITROFURANTOIN MONOHYD MACRO 100 MG PO CAPS: 100.0000 mg | ORAL_CAPSULE | Freq: Two times a day (BID) | ORAL | 0 refills | Status: AC

## 2018-04-11 NOTE — Addendum Note (Signed)
Addended by: Marin Olp on: 04/11/2018 09:28 PM   Modules accepted: Orders

## 2018-04-21 ENCOUNTER — Encounter: Payer: Self-pay | Admitting: Family Medicine

## 2018-04-21 ENCOUNTER — Ambulatory Visit (INDEPENDENT_AMBULATORY_CARE_PROVIDER_SITE_OTHER): Payer: Medicare HMO | Admitting: Family Medicine

## 2018-04-21 VITALS — BP 144/60 | HR 57 | Temp 98.5°F | Ht 62.0 in | Wt 150.2 lb

## 2018-04-21 DIAGNOSIS — R319 Hematuria, unspecified: Secondary | ICD-10-CM

## 2018-04-21 DIAGNOSIS — R1032 Left lower quadrant pain: Secondary | ICD-10-CM | POA: Diagnosis not present

## 2018-04-21 DIAGNOSIS — R34 Anuria and oliguria: Secondary | ICD-10-CM | POA: Diagnosis not present

## 2018-04-21 DIAGNOSIS — M545 Low back pain, unspecified: Secondary | ICD-10-CM

## 2018-04-21 DIAGNOSIS — I10 Essential (primary) hypertension: Secondary | ICD-10-CM | POA: Diagnosis not present

## 2018-04-21 LAB — POCT URINALYSIS DIPSTICK
Bilirubin, UA: NEGATIVE
Glucose, UA: NEGATIVE
Ketones, UA: NEGATIVE
NITRITE UA: NEGATIVE
PH UA: 5.5 (ref 5.0–8.0)
PROTEIN UA: NEGATIVE
RBC UA: POSITIVE
SPEC GRAV UA: 1.01 (ref 1.010–1.025)
UROBILINOGEN UA: 0.2 U/dL

## 2018-04-21 MED ORDER — NITROFURANTOIN MONOHYD MACRO 100 MG PO CAPS: 100.0000 mg | ORAL_CAPSULE | Freq: Two times a day (BID) | ORAL | 0 refills | Status: DC

## 2018-04-21 NOTE — Patient Instructions (Addendum)
Lets get CT  Scan hopefully tomorrow- they will reach out to you to make sure kidney stone is not involved causing difficulty to treat UTIs  I am going to put you back on nitrofurantoin for now until we get results back

## 2018-04-21 NOTE — Progress Notes (Signed)
Subjective:  Lisa Greene is a 77 y.o. year old very pleasant female patient who presents for/with See problem oriented charting ROS- no fever, chills, nausea, vomiting. Does have left groin, suprapubic, and low back pain. No dysuria. Some nocturia but stable.    Past Medical History-  Patient Active Problem List   Diagnosis Date Noted  . Frequent PVCs 04/14/2017    Priority: High  . Breast cancer of lower-inner quadrant of left female breast ([place]) 11/01/2015    Priority: High  . Low vitamin B12 level 06/24/2016    Priority: Medium  . CKD (chronic kidney disease), stage III ([place]) 10/22/2015    Priority: Medium  . Asthma, moderate persistent, well-controlled 04/19/2014    Priority: Medium  . Hypertension 11/25/2010    Priority: Medium  . Hyperlipidemia 12/20/2006    Priority: Medium  . Osteopenia 12/20/2006    Priority: Medium  . Family history of abdominal aortic aneurysm 03/08/2015    Priority: Low  . Eosinophilia 02/08/2009    Priority: Low  . Allergic rhinitis 01/31/2008    Priority: Low  . Aortic atherosclerosis ([place]) 11/23/2016    Medications- reviewed and updated Current Outpatient Medications  Medication Sig Dispense Refill  . Albuterol Sulfate (PROAIR RESPICLICK) 970 (90 Base) MCG/ACT AEPB Inhale 1 puff into the lungs every 8 (eight) hours as needed (wheezing/shortness of breath).    Marland Kitchen anastrozole (ARIMIDEX) 1 MG tablet TAKE ONE TABLET BY MOUTH DAILY 90 tablet 0  . atorvastatin (LIPITOR) 20 MG tablet Take 1 tablet (20 mg total) by mouth daily. 30 tablet 5  . Calcium 500-125 MG-UNIT TABS Take 1 tablet by mouth daily.    . cholecalciferol (VITAMIN D) 1000 UNITS tablet Take 2,000 Units by mouth daily.    . fluticasone (FLONASE) 50 MCG/ACT nasal spray Place 1 spray into both nostrils daily as needed.     . fluticasone furoate-vilanterol (BREO ELLIPTA) 100-25 MCG/INH AEPB Inhale 1 puff into the lungs daily.    . irbesartan (AVAPRO) 150 MG tablet Take 1 tablet (150 mg  total) by mouth daily. 30 tablet 11  . metoprolol succinate (TOPROL-XL) 50 MG 24 hr tablet Take 1 tablet (50 mg total) by mouth daily. 90 tablet 3  . montelukast (SINGULAIR) 10 MG tablet Take 1 tablet by mouth at bedtime.    . Omega-3 Fatty Acids (FISH OIL) 1000 MG CAPS Take 1,000 mg by mouth Nightly.       Objective: BP (!) 144/60 (BP Location: Right Arm, Patient Position: Sitting, Cuff Size: Normal)   Pulse (!) 57   Temp 98.5 F (36.9 C) (Oral)   Ht 5\' 2"  (1.575 m)   Wt 150 lb 4 oz (68.2 kg)   SpO2 97%   BMI 27.48 kg/m  Gen: NAD, resting comfortably CV: RRR no murmurs rubs or gallops Lungs: CTAB no crackles, wheeze, rhonchi Abdomen: soft/tender in suprapubic area and left groin/nondistended/normal bowel sounds. No rebound or guarding.  Ext: no edema Skin: warm, dry MSK-some pain in right lower back-reasonable range of motion lumbar spine  Results for orders placed or performed in visit on 04/21/18 (from the past 24 hour(s))  POCT urinalysis dipstick     Status: Abnormal   Collection Time: 04/21/18  3:56 PM  Result Value Ref Range   Color, UA yellow    Clarity, UA clear    Glucose, UA Negative Negative   Bilirubin, UA negative    Ketones, UA negative    Spec Grav, UA 1.010 1.010 - 1.025  Blood, UA positive    pH, UA 5.5 5.0 - 8.0   Protein, UA Negative Negative   Urobilinogen, UA 0.2 0.2 or 1.0 E.U./dL   Nitrite, UA negative    Leukocytes, UA Large (3+) (A) Negative   Appearance     Odor       Assessment/Plan:  Hematuria, unspecified type - Plan: CT RENAL STONE STUDY, Urine Culture Left groin pain - Plan: CT RENAL STONE STUDY Low back pain without sciatica, unspecified back pain laterality, unspecified chronicity - Plan: CT RENAL STONE STUDY   S: Patient states she has flank pain, lower abdominal pain starting 2 days ago.  She feels like she is struggling to urinate.    Patient presented almost a month ago with urinary complaints of frequent urination but no  UTI was found- presented about 2 weeks later and We treated her for a urinary tract infection about  for E. coli resistant to Augmentin, Unasyn, Keflex- we treated her with 7 days of nitrofurantoin. At time of that visit was having some back pain and lower abdominal pain  On antibiotics, Lower abdominal pain got somewhat better on the antibiotic but never fully resolved. Some low back pain which has worsened significantly from last visit (she states perhaps very mild pain at that time) and started to increase once antibiotic stopped. Pain in low back is 8/10. Hard to walk at times. Able to sleep still- doesn't wake her form sleep. Tylenol does give her reasonable relief from the pain.   Nocturia at night due to taking BP meds at night- no change there. Feels like cannot urinate as well now. Also notes left groin pain along with worsening suprapubic pain.  A/P: 77 year old woman with almost a month of urinary symptoms-recently treated for UTI with symptoms of suprapubic and low back pain- off antibiotics she has had significant worsening of low back pain-she also now has hematuria.  We are going to get a CT scan renal stone study to evaluate for stones or signs of kidney inflammation. - since pain worsened off antibiotic and I am concerned about possible stone as well- will restart antibiotic nitrofurantoin pending CT and culture-  -? Diverticulitis - has had sharp pain with this in past- I dont have a strong suspicion of diverticulitis but may be able to see some inflammation on CT scan even though noncontrast- even if not - lack of stone may raise this on differential as well  Hypertension S: controlled poorly on hydrochlorthiazide 25 mg, metoprolol 50 mg extended release, losartan 100 mg.  Slight elevation today but patient states she is in some pain. BP Readings from Last 3 Encounters:  04/21/18 (!) 144/60  04/08/18 124/66  03/23/18 134/82  A/P: We discussed blood pressure goal of <140/90. Continue  current meds: Was controlled last visit when not in pain-can reevaluate once symptoms have improved    Future Appointments  Date Time Provider [place]  04/22/2018 10:30 AM [facility] 1 [facility]  07/04/2018 10:00 AM Nicholas Lose, MD [facility] None   Lab/Order associations: Decreased urination - Plan: POCT urinalysis dipstick  Hematuria, unspecified type - Plan: CT RENAL STONE STUDY, Urine Culture  Left groin pain - Plan: CT RENAL STONE STUDY  Low back pain without sciatica, unspecified back pain laterality, unspecified chronicity - Plan: CT RENAL STONE STUDY  Essential hypertension  Meds ordered this encounter  Medications  . nitrofurantoin, macrocrystal-monohydrate, (MACROBID) 100 MG capsule    Sig: Take 1 capsule (100 mg total) by mouth  2 (two) times daily.    Dispense:  14 capsule    Refill:  0   Return precautions advised.  Garret Reddish, MD

## 2018-04-21 NOTE — Assessment & Plan Note (Signed)
S: controlled poorly on hydrochlorthiazide 25 mg, metoprolol 50 mg extended release, losartan 100 mg.  Slight elevation today but patient states she is in some pain. BP Readings from Last 3 Encounters:  04/21/18 (!) 144/60  04/08/18 124/66  03/23/18 134/82  A/P: We discussed blood pressure goal of <140/90. Continue current meds: Was controlled last visit when not in pain-can reevaluate once symptoms have improved

## 2018-04-22 ENCOUNTER — Ambulatory Visit (INDEPENDENT_AMBULATORY_CARE_PROVIDER_SITE_OTHER)
Admission: RE | Admit: 2018-04-22 | Discharge: 2018-04-22 | Disposition: A | Discharge status: Home / Self Care | Payer: Medicare HMO | Source: Ambulatory Visit | Attending: Family Medicine | Admitting: Family Medicine

## 2018-04-22 DIAGNOSIS — K5730 Diverticulosis of large intestine without perforation or abscess without bleeding: Secondary | ICD-10-CM | POA: Diagnosis not present

## 2018-04-22 DIAGNOSIS — R1032 Left lower quadrant pain: Secondary | ICD-10-CM

## 2018-04-22 DIAGNOSIS — K449 Diaphragmatic hernia without obstruction or gangrene: Secondary | ICD-10-CM | POA: Diagnosis not present

## 2018-04-22 DIAGNOSIS — R319 Hematuria, unspecified: Secondary | ICD-10-CM | POA: Diagnosis not present

## 2018-04-22 DIAGNOSIS — M545 Low back pain, unspecified: Secondary | ICD-10-CM

## 2018-04-22 DIAGNOSIS — N390 Urinary tract infection, site not specified: Secondary | ICD-10-CM | POA: Diagnosis not present

## 2018-04-23 LAB — URINE CULTURE
MICRO NUMBER:: 91406681
SPECIMEN QUALITY:: ADEQUATE

## 2018-04-25 ENCOUNTER — Other Ambulatory Visit (INDEPENDENT_AMBULATORY_CARE_PROVIDER_SITE_OTHER): Payer: Medicare HMO

## 2018-04-25 ENCOUNTER — Other Ambulatory Visit: Payer: Self-pay

## 2018-04-25 DIAGNOSIS — R319 Hematuria, unspecified: Secondary | ICD-10-CM | POA: Diagnosis not present

## 2018-04-26 ENCOUNTER — Other Ambulatory Visit: Payer: Self-pay

## 2018-04-26 ENCOUNTER — Telehealth: Payer: Self-pay

## 2018-04-26 DIAGNOSIS — R319 Hematuria, unspecified: Secondary | ICD-10-CM

## 2018-04-26 DIAGNOSIS — N6310 Unspecified lump in the right breast, unspecified quadrant: Secondary | ICD-10-CM

## 2018-04-26 LAB — URINE CULTURE
MICRO NUMBER:: 91418532
SPECIMEN QUALITY:: ADEQUATE

## 2018-04-26 LAB — URINALYSIS, MICROSCOPIC ONLY

## 2018-04-26 MED ORDER — NITROFURANTOIN MONOHYD MACRO 100 MG PO CAPS: 100.0000 mg | ORAL_CAPSULE | Freq: Two times a day (BID) | ORAL | 0 refills | Status: DC

## 2018-04-26 NOTE — Telephone Encounter (Signed)
Received an APPROVAL of coverage for CT ABDOMEN and PELVIS   Approval valid April 21, 2018 through July 20, 2018  Call [employer] at [phone] for any questions

## 2018-04-27 ENCOUNTER — Telehealth: Payer: Self-pay

## 2018-04-27 NOTE — Telephone Encounter (Signed)
Pt called to report that she has been dealing with chronic UTI ever since she has been on anastrozole, going on 2 yrs now. Pt has been treated multiple times for UTI and was told by her MD that it could be the anastrozole causing it. Pt wants to stop anastrozole and see if she can take something else. Told pt that she may stop until her UTI gets better and we can switch her to a different type of AE.   Will notify Dr.Gudena, once she calls back and gives [place] an update on how she is feeling. Pt also advised to drink plenty of fluids and to supplement with probiotics to help maintain good urinary tract health. Also, to avoid using any feminine wash that could disrupt pt moisture and vaginal ph balance. Pt verbalized understanding and will implement suggestions. Pt to call in 2-3 weeks to report how she is doing and when she is ready to try a new ae medication.

## 2018-05-03 ENCOUNTER — Other Ambulatory Visit: Payer: Self-pay | Admitting: Family Medicine

## 2018-05-03 DIAGNOSIS — E785 Hyperlipidemia, unspecified: Secondary | ICD-10-CM

## 2018-05-10 DIAGNOSIS — R69 Illness, unspecified: Secondary | ICD-10-CM | POA: Diagnosis not present

## 2018-05-10 DIAGNOSIS — J4540 Moderate persistent asthma, uncomplicated: Secondary | ICD-10-CM | POA: Diagnosis not present

## 2018-05-10 DIAGNOSIS — H1045 Other chronic allergic conjunctivitis: Secondary | ICD-10-CM | POA: Diagnosis not present

## 2018-05-10 DIAGNOSIS — J300 Vasomotor rhinitis: Secondary | ICD-10-CM | POA: Diagnosis not present

## 2018-05-16 ENCOUNTER — Encounter: Payer: Self-pay | Admitting: Family Medicine

## 2018-05-16 ENCOUNTER — Ambulatory Visit (INDEPENDENT_AMBULATORY_CARE_PROVIDER_SITE_OTHER): Payer: Medicare HMO | Admitting: Family Medicine

## 2018-05-16 VITALS — BP 138/74 | HR 60 | Temp 98.4°F | Ht 62.0 in | Wt 149.2 lb

## 2018-05-16 DIAGNOSIS — Z87440 Personal history of urinary (tract) infections: Secondary | ICD-10-CM | POA: Diagnosis not present

## 2018-05-16 DIAGNOSIS — Z170 Estrogen receptor positive status [ER+]: Secondary | ICD-10-CM

## 2018-05-16 DIAGNOSIS — R319 Hematuria, unspecified: Secondary | ICD-10-CM | POA: Diagnosis not present

## 2018-05-16 DIAGNOSIS — C50312 Malignant neoplasm of lower-inner quadrant of left female breast: Secondary | ICD-10-CM | POA: Diagnosis not present

## 2018-05-16 LAB — URINALYSIS, ROUTINE W REFLEX MICROSCOPIC
Bilirubin Urine: NEGATIVE
HGB URINE DIPSTICK: NEGATIVE
Ketones, ur: NEGATIVE
Nitrite: NEGATIVE
Specific Gravity, Urine: <=1.005 — AB (ref 1.000–1.030)
TOTAL PROTEIN, URINE-UPE24: NEGATIVE
Urine Glucose: NEGATIVE
Urobilinogen, UA: 0.2 (ref 0.0–1.0)
pH: 7 (ref 5.0–8.0)

## 2018-05-16 NOTE — Progress Notes (Signed)
Subjective:  Lisa Greene is a 77 y.o. year old very pleasant Lisa patient who presents for/with See problem oriented charting ROS- no dysuria, polyuria, fever, chills. Less bloating since stopping anastrazole   Past Medical History-  Patient Active Problem List   Diagnosis Date Noted  . Frequent PVCs 04/14/2017    Priority: High  . Breast cancer of lower-inner quadrant of left Lisa breast ([place]) 11/01/2015    Priority: High  . Low vitamin B12 level 06/24/2016    Priority: Medium  . CKD (chronic kidney disease), stage III ([place]) 10/22/2015    Priority: Medium  . Asthma, moderate persistent, well-controlled 04/19/2014    Priority: Medium  . Hypertension 11/25/2010    Priority: Medium  . Hyperlipidemia 12/20/2006    Priority: Medium  . Osteopenia 12/20/2006    Priority: Medium  . Family history of abdominal aortic aneurysm 03/08/2015    Priority: Low  . Eosinophilia 02/08/2009    Priority: Low  . Allergic rhinitis 01/31/2008    Priority: Low  . Aortic atherosclerosis ([place]) 11/23/2016    Medications- reviewed and updated Current Outpatient Medications  Medication Sig Dispense Refill  . Albuterol Sulfate (PROAIR RESPICLICK) 353 (90 Base) MCG/ACT AEPB Inhale 1 puff into the lungs every 8 (eight) hours as needed (wheezing/shortness of breath).    Marland Kitchen atorvastatin (LIPITOR) 20 MG tablet TAKE ONE TABLET BY MOUTH DAILY 90 tablet 4  . Calcium 500-125 MG-UNIT TABS Take 1 tablet by mouth daily.    . cholecalciferol (VITAMIN D) 1000 UNITS tablet Take 2,000 Units by mouth daily.    . fluticasone (FLONASE) 50 MCG/ACT nasal spray Place 1 spray into both nostrils daily as needed.     . fluticasone furoate-vilanterol (BREO ELLIPTA) 100-25 MCG/INH AEPB Inhale 1 puff into the lungs daily.    . irbesartan (AVAPRO) 150 MG tablet Take 1 tablet (150 mg total) by mouth daily. 30 tablet 11  . metoprolol succinate (TOPROL-XL) 50 MG 24 hr tablet Take 1 tablet (50 mg total) by mouth daily. 90 tablet  3  . montelukast (SINGULAIR) 10 MG tablet Take 1 tablet by mouth at bedtime.    . nitrofurantoin, macrocrystal-monohydrate, (MACROBID) 100 MG capsule Take 1 capsule (100 mg total) by mouth 2 (two) times daily. 14 capsule 0  . Omega-3 Fatty Acids (FISH OIL) 1000 MG CAPS Take 1,000 mg by mouth Nightly.     No current facility-administered medications for this visit.     Objective: BP 138/74 (BP Location: Left Arm, Patient Position: Sitting, Cuff Size: Large)   Pulse 60   Temp 98.4 F (36.9 C) (Oral)   Ht 5\' 2"  (1.575 m)   Wt 149 lb 3.2 oz (67.7 kg)   SpO2 99%   BMI 27.29 kg/m  Gen: NAD, resting comfortably CV: RRR  Lungs: nonlabored, normal respiratory rate Abdomen: soft/nontender including no suprapubic or CVA tenderness/nondistended/normal bowel sounds.  Ext: no edema Skin: warm, dry  Assessment/Plan:  Other notes: 1.  Not in CVA area-right mid back pain- heat helps- lays on floor also helps.  No midline pain today.  No pain on examination at all.  It is in her paraspinous muscles- discussed conservative care using heat and ice-also advised core strengthening program which she declines at this time.  Also declines physical therapy.  I do not believe this is related to her kidney issues.   Hematuria, unspecified type - Plan: Urinalysis, Routine w reflex microscopic, Urine Culture  History of UTI  Malignant neoplasm of lower-inner quadrant of left  breast in [place], estrogen receptor positive ([place]) S: Patient most recently treated for UTI 04/21/18  Resistant to keflex- we placed her on macrobid but had peristent symptoms at a week so extended to 2 weeks. She finished that course around beginning of December.  She had hematuria during testing for this most recent UTI.  Today she states UTI finally cleared. No dysuria or frequent urination. Nocturia back to once a night which is her baseline  She states honestly still had some discomfort until stopped anastrazole- particularly  lower abdominal bloating. . She called oncology and they mentioned did increase UTI risks. Per [email or web address] 2-8% of people experience UTI in association with medication. Patient stopped and plans to follow up with oncology about other options.   Patient does not want to restart anastrozole at this time.  She had a UTI on 10/11/2017 confirmed by culture.  She later had confirmed UTI on 03/23/2018 and 04/08/2018 and 04/21/2018- we reviewed these together though and it is possible and likely these were just never adequately cured.  She believes they could not be cured while she was on anastrozole and wants to remain off. A/P: Patient with what looks like to me 2 UTIs over the last 6 months-she attributes this to anastrozole and wants to remain off.  I asked her to discuss this with her oncologist.  From our end-we will get a urinalysis with reflex microscopic.  If any hematuria we discussed potentially referring to urology if no UTI concurrently.  Will get urine culture to ensure resolution of UTI.   Future Appointments  Date Time Provider [place]  07/04/2018 10:00 AM Nicholas Lose, MD [facility] None   No follow-ups on file.  Lab/Order associations: Urinary tract infection without hematuria, site unspecified - Plan: POCT Urinalysis Dipstick (Automated)  No orders of the defined types were placed in this encounter.   Return precautions advised.  Garret Reddish, MD

## 2018-05-16 NOTE — Patient Instructions (Addendum)
Sending urine off for testing to make sure no infection and make sure no blood in urine- may need urology opinion if there is any blood still- but blood was likely due to the UTI

## 2018-05-18 LAB — URINE CULTURE
MICRO NUMBER: 91502229
SPECIMEN QUALITY: ADEQUATE

## 2018-05-19 ENCOUNTER — Telehealth: Payer: Self-pay | Admitting: Family Medicine

## 2018-05-19 DIAGNOSIS — N200 Calculus of kidney: Secondary | ICD-10-CM | POA: Diagnosis not present

## 2018-05-19 DIAGNOSIS — N6310 Unspecified lump in the right breast, unspecified quadrant: Secondary | ICD-10-CM | POA: Diagnosis not present

## 2018-05-19 LAB — HM MAMMOGRAPHY

## 2018-05-19 NOTE — Telephone Encounter (Signed)
Referral done see result notes.

## 2018-05-19 NOTE — Telephone Encounter (Signed)
See note  Copied from [place] [id number]. Topic: General - Other >> May 19, 2018  1:10 PM Sheran Luz wrote: Reason for CRM: Per mychart message recorded in lab notes- Patient called stating she is willing to be referred to Urologist.

## 2018-05-19 NOTE — Addendum Note (Signed)
Addended by: Gwenyth Ober R on: 05/19/2018 08:46 AM   Modules accepted: Orders

## 2018-05-24 ENCOUNTER — Encounter: Payer: Self-pay | Admitting: Family Medicine

## 2018-06-14 DIAGNOSIS — L728 Other follicular cysts of the skin and subcutaneous tissue: Secondary | ICD-10-CM | POA: Diagnosis not present

## 2018-06-14 DIAGNOSIS — B351 Tinea unguium: Secondary | ICD-10-CM | POA: Diagnosis not present

## 2018-06-14 DIAGNOSIS — L821 Other seborrheic keratosis: Secondary | ICD-10-CM | POA: Diagnosis not present

## 2018-06-17 ENCOUNTER — Telehealth: Payer: Self-pay

## 2018-06-17 NOTE — Telephone Encounter (Signed)
Pt called to let Dr.Gudena know that she had been off anastrozole for a few months now and would like to remain off the anastrozole. Her frequent UTI's have completely resolved since stopping anastrozole. Pt is coming in 07/04/2018 for follow up and will discuss treatment, moving forward. Pt does not want to start anything else right now until she speaks with MD.

## 2018-06-30 ENCOUNTER — Telehealth: Payer: Self-pay | Admitting: Hematology and Oncology

## 2018-06-30 NOTE — Telephone Encounter (Signed)
VG PAL 2/3 - moved f/u to 2/10. Spoke with patient.

## 2018-07-01 IMAGING — DX DG RIBS W/ CHEST 3+V BILAT
5 series · 5 of 5 positions shown · non-contrast
Comparison: Chest radiograph 10/08/2014

CLINICAL DATA: LEFT chest and axillary pain

EXAM:
BILATERAL RIBS AND CHEST - 4+ VIEW

[chest pa]
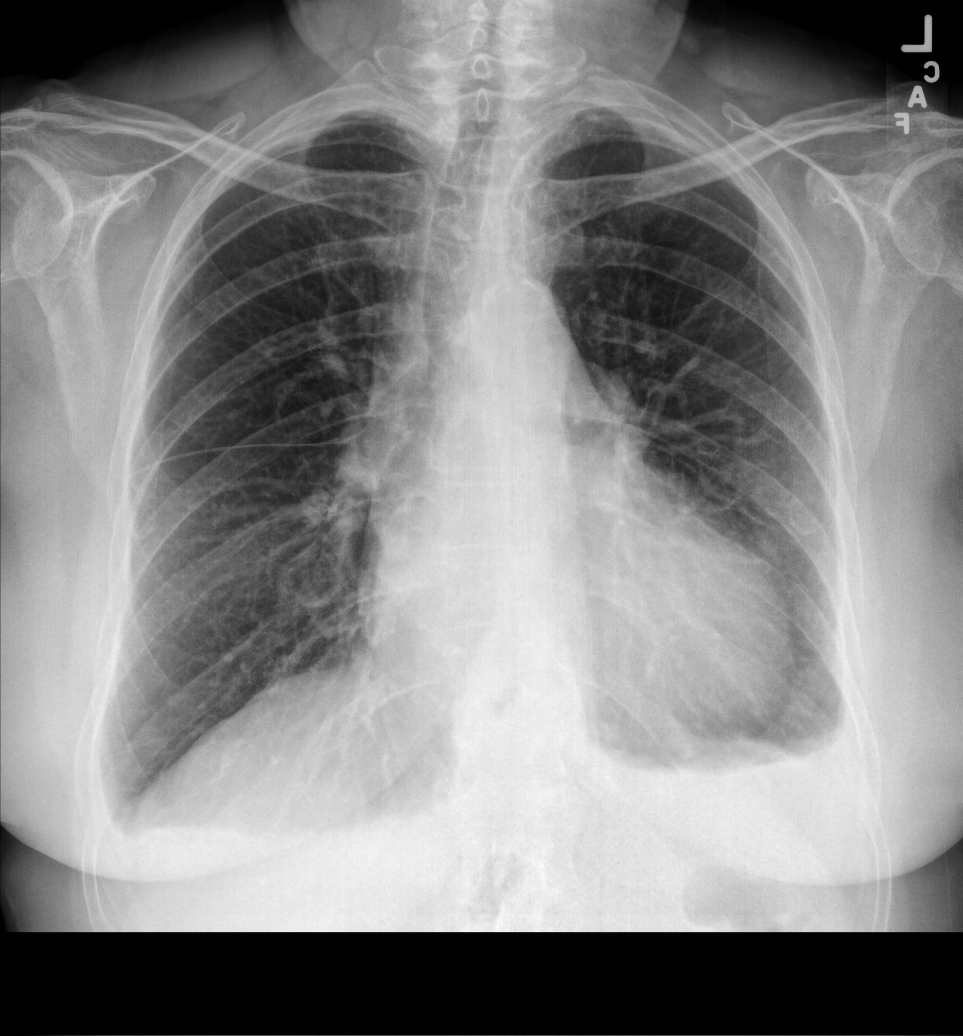

[oblique ribs oblique (1 of 4)]
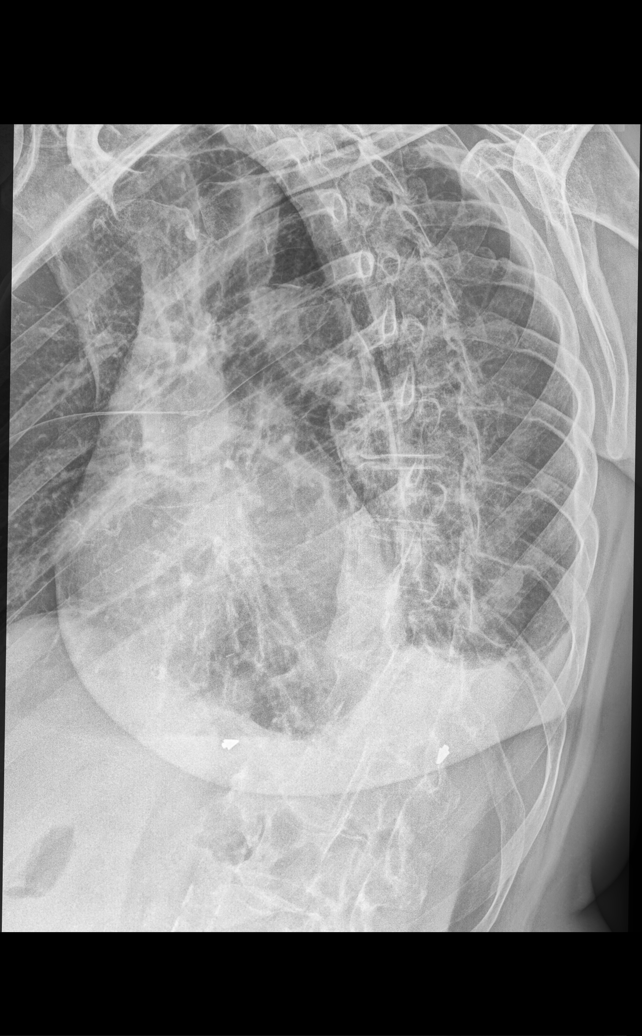

[oblique ribs oblique (2 of 4)]
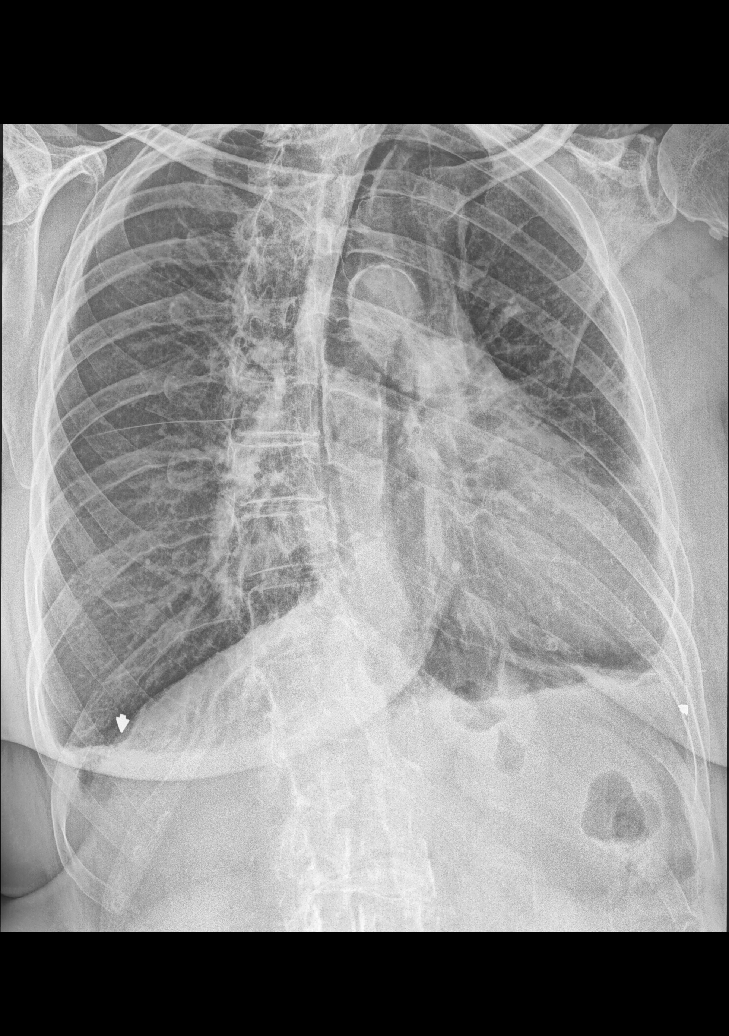

[oblique ribs oblique (3 of 4)]
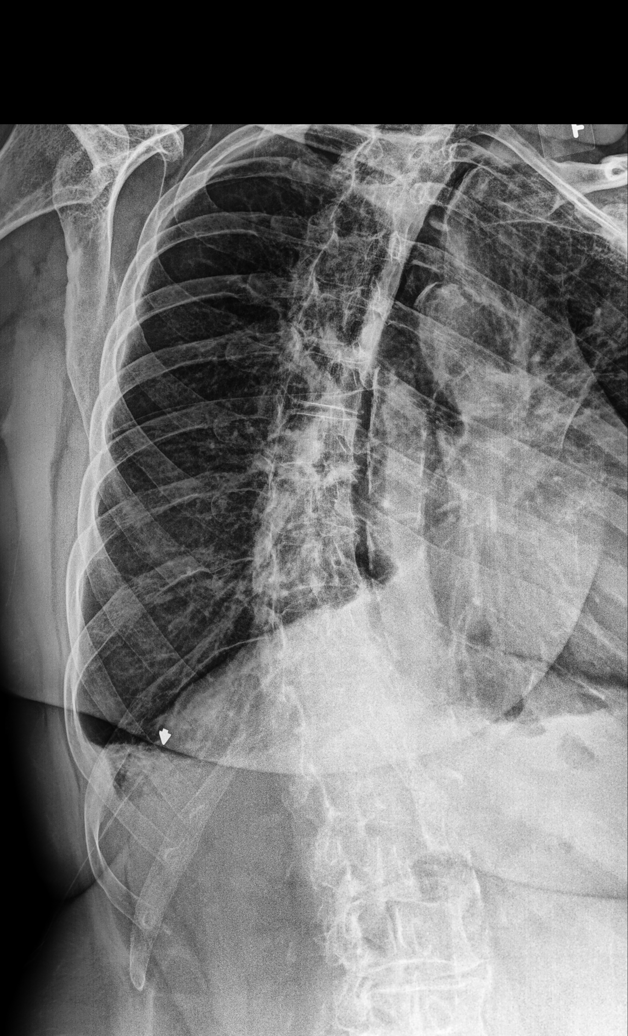

[oblique ribs oblique (4 of 4)]
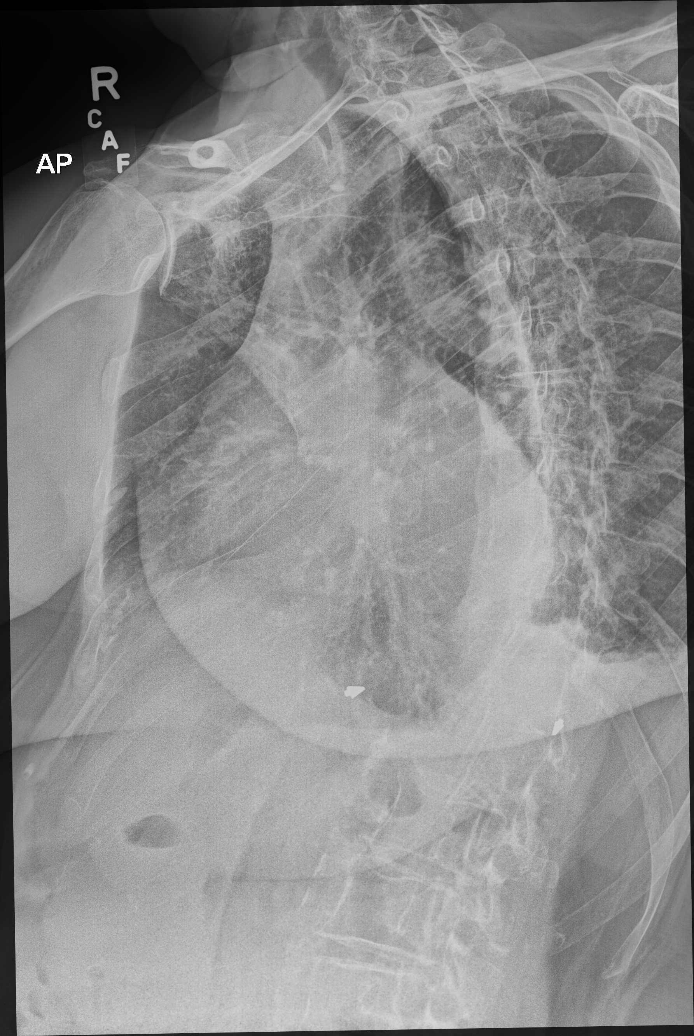

[5 of 5 positions shown; findings below may reference images not displayed]

FINDINGS: Enlargement of cardiac silhouette.

Mediastinal contours and pulmonary vascularity normal.

Bibasilar effusions and atelectasis.

No acute infiltrate or pneumothorax.

Bones demineralized.

No rib fracture or bone destruction.
IMPRESSION: Enlargement of cardiac silhouette.

Small bibasilar pleural effusions and atelectasis.

## 2018-07-04 ENCOUNTER — Inpatient Hospital Stay: Payer: Medicare HMO | Admitting: Hematology and Oncology

## 2018-07-11 ENCOUNTER — Inpatient Hospital Stay: Payer: Medicare HMO | Attending: Hematology and Oncology | Admitting: Hematology and Oncology

## 2018-07-11 ENCOUNTER — Telehealth: Payer: Self-pay | Admitting: Hematology and Oncology

## 2018-07-11 DIAGNOSIS — Z87440 Personal history of urinary (tract) infections: Secondary | ICD-10-CM | POA: Diagnosis not present

## 2018-07-11 DIAGNOSIS — Z170 Estrogen receptor positive status [ER+]: Secondary | ICD-10-CM | POA: Diagnosis not present

## 2018-07-11 DIAGNOSIS — C50312 Malignant neoplasm of lower-inner quadrant of left female breast: Secondary | ICD-10-CM | POA: Insufficient documentation

## 2018-07-11 DIAGNOSIS — Z79899 Other long term (current) drug therapy: Secondary | ICD-10-CM

## 2018-07-11 DIAGNOSIS — Z79811 Long term (current) use of aromatase inhibitors: Secondary | ICD-10-CM | POA: Insufficient documentation

## 2018-07-11 DIAGNOSIS — Z923 Personal history of irradiation: Secondary | ICD-10-CM

## 2018-07-11 NOTE — Assessment & Plan Note (Signed)
Left lumpectomy 11/28/2015: DCIS with calcifications, high-grade, 1.3 cm, focally less than 0.1 cm to posterior margin and focally less than 0.1 cm lateral margin, ER 95%, PR 80%, TisN0 stage 0  Adj XRT 02/05/16- 03/03/16 Current Treatment: Adj Tamoxifen 20 mg daily X 5 years started 04/30/2016 discontinued February 2020 Tamoxifen toxicities:  Recurrent UTIs: Attributed to tamoxifen therapy. Patient does not want to take any other antiestrogen therapy.  Based upon her low risk, we will proceed with surveillance.  Surveillance:  Breast exam 07/11/2018 Mammogram 05/19/2018 at [facility], breast density category B  Return to clinic in 1 year for follow-up

## 2018-07-11 NOTE — Telephone Encounter (Signed)
Gave avs and calendar ° °

## 2018-07-11 NOTE — Progress Notes (Signed)
Patient Care Team: Marin Olp, MD as PCP - General (Family Medicine) Belva Crome, MD as PCP - Cardiology (Cardiology) Rolm Bookbinder, MD as Consulting Physician (General Surgery) Nicholas Lose, MD as Consulting Physician (Hematology and Oncology) Kyung Rudd, MD as Consulting Physician (Radiation Oncology)  DIAGNOSIS:  Encounter Diagnosis  Name Primary?  . Malignant neoplasm of lower-inner quadrant of left breast in female, estrogen receptor positive ([place])     SUMMARY OF ONCOLOGIC HISTORY:   Breast cancer of lower-inner quadrant of left female breast ([place])   10/30/2015 Initial Diagnosis    Left breast biopsy: DCIS intermediate grade with calcifications (micropapillary architecture), fibrocystic changes with adenosis, complex sclerosing lesion, UDH, ER 95%, PR 80%; mammogram in ultrasound showed right breast calcifications 4.1 cm    11/28/2015 Surgery    Left lumpectomy: DCIS with calcifications, high-grade, 1.3 cm, focally less than 0.1 cm to posterior margin and focally less than 0.1 cm lateral margin, ER 95%, PR 80%, TisN0 stage 0    02/05/2016 - 03/03/2016 Radiation Therapy    Adj XRT    04/30/2016 -  Anti-estrogen oral therapy    Tamoxifen 20 mg daily switched to Anastrozole for tachycardia (Sept 2018)     CHIEF COMPLIANT: Intolerance of tamoxifen with frequent urinary infections  INTERVAL HISTORY: Lisa Greene is a 78 year old with above-mentioned history of left breast DCIS underwent lumpectomy radiation and was taking antiestrogen therapy.  She had recurrent urinary infections and she stopped taking it.  Her urinary infections have resolved.  She is here to discuss the risks and benefits of continuing antiestrogen therapy.  REVIEW OF SYSTEMS:   Constitutional: Denies fevers, chills or abnormal weight loss Eyes: Denies blurriness of vision Ears, nose, mouth, throat, and face: Denies mucositis or sore throat Respiratory: Denies cough, dyspnea or  wheezes Cardiovascular: Denies palpitation, chest discomfort Gastrointestinal:  Denies nausea, heartburn or change in bowel habits Skin: Denies abnormal skin rashes Lymphatics: Denies new lymphadenopathy or easy bruising Neurological:Denies numbness, tingling or new weaknesses Behavioral/Psych: Mood is stable, no new changes  Extremities: No lower extremity edema Breast:  denies any pain or lumps or nodules in either breasts All other systems were reviewed with the patient and are negative.  I have reviewed the past medical history, past surgical history, social history and family history with the patient and they are unchanged from previous note.  ALLERGIES:  is allergic to lisinopril; latex; penicillins; sulfonamide derivatives; and tape.  MEDICATIONS:  Current Outpatient Medications  Medication Sig Dispense Refill  . Albuterol Sulfate (PROAIR RESPICLICK) 034 (90 Base) MCG/ACT AEPB Inhale 1 puff into the lungs every 8 (eight) hours as needed (wheezing/shortness of breath).    Marland Kitchen atorvastatin (LIPITOR) 20 MG tablet TAKE ONE TABLET BY MOUTH DAILY 90 tablet 4  . Calcium 500-125 MG-UNIT TABS Take 1 tablet by mouth daily.    . cholecalciferol (VITAMIN D) 1000 UNITS tablet Take 2,000 Units by mouth daily.    . fluticasone (FLONASE) 50 MCG/ACT nasal spray Place 1 spray into both nostrils daily as needed.     . fluticasone furoate-vilanterol (BREO ELLIPTA) 100-25 MCG/INH AEPB Inhale 1 puff into the lungs daily.    . irbesartan (AVAPRO) 150 MG tablet Take 1 tablet (150 mg total) by mouth daily. 30 tablet 11  . metoprolol succinate (TOPROL-XL) 50 MG 24 hr tablet Take 1 tablet (50 mg total) by mouth daily. 90 tablet 3  . montelukast (SINGULAIR) 10 MG tablet Take 1 tablet by mouth at bedtime.    Marland Kitchen  Omega-3 Fatty Acids (FISH OIL) 1000 MG CAPS Take 1,000 mg by mouth Nightly.     No current facility-administered medications for this visit.     PHYSICAL EXAMINATION: ECOG PERFORMANCE STATUS: 1 -  Symptomatic but completely ambulatory  Vitals:   07/11/18 1346  BP: (!) 125/92  Pulse: 71  Resp: 16  Temp: 98.5 F (36.9 C)  SpO2: 99%   Filed Weights   07/11/18 1346  Weight: 153 lb 8 oz (69.6 kg)    GENERAL:alert, no distress and comfortable SKIN: skin color, texture, turgor are normal, no rashes or significant lesions EYES: normal, Conjunctiva are pink and non-injected, sclera clear OROPHARYNX:no exudate, no erythema and lips, buccal mucosa, and tongue normal  NECK: supple, thyroid normal size, non-tender, without nodularity LYMPH:  no palpable lymphadenopathy in the cervical, axillary or inguinal LUNGS: clear to auscultation and percussion with normal breathing effort HEART: regular rate & rhythm and no murmurs and no lower extremity edema ABDOMEN:abdomen soft, non-tender and normal bowel sounds MUSCULOSKELETAL:no cyanosis of digits and no clubbing  NEURO: alert & oriented x 3 with fluent speech, no focal motor/sensory deficits EXTREMITIES: No lower extremity edema BREAST: No palpable masses or nodules in either right or left breasts. No palpable axillary supraclavicular or infraclavicular adenopathy no breast tenderness or nipple discharge. (exam performed in the presence of a chaperone)  LABORATORY DATA:  I have reviewed the data as listed CMP Latest Ref Rng & Units 04/08/2018 11/08/2017 08/30/2017  Glucose 70 - 99 mg/dL 100(H) 105(H) 147(H)  BUN 6 - 23 mg/dL 18 13 13   Creatinine 0.40 - 1.20 mg/dL 1.22(H) 1.16 1.10  Sodium 135 - 145 mEq/L 135 137 139  Potassium 3.5 - 5.1 mEq/L 4.5 4.6 3.7  Chloride 96 - 112 mEq/L 101 103 104  CO2 19 - 32 mEq/L 27 25 27   Calcium 8.4 - 10.5 mg/dL 9.3 9.4 9.2  Total Protein 6.0 - 8.3 g/dL - 6.9 -  Total Bilirubin 0.2 - 1.2 mg/dL - 0.7 -  Alkaline Phos 39 - 117 U/L - 61 -  AST 0 - 37 U/L - 15 -  ALT 0 - 35 U/L - 11 -    Lab Results  Component Value Date   WBC 6.3 04/08/2018   HGB 13.0 04/08/2018   HCT 38.1 04/08/2018   MCV 88.0  04/08/2018   PLT 382.0 04/08/2018   NEUTROABS 3.6 04/08/2018    ASSESSMENT & PLAN:  Breast cancer of lower-inner quadrant of left female breast ([place]) Left lumpectomy 11/28/2015: DCIS with calcifications, high-grade, 1.3 cm, focally less than 0.1 cm to posterior margin and focally less than 0.1 cm lateral margin, ER 95%, PR 80%, TisN0 stage 0  Adj XRT 02/05/16- 03/03/16 Current Treatment: Adj Tamoxifen 20 mg daily X 5 years started 04/30/2016 discontinued February 2020 Tamoxifen toxicities:  Recurrent UTIs: Attributed to tamoxifen therapy. Patient does not want to take any other antiestrogen therapy.  Based upon her low risk, we will proceed with surveillance and discontinue tamoxifen permanently.  Surveillance:  Breast exam to be done annually Mammogram 05/19/2018 at [facility], breast density category B  Return to clinic in 1 year for follow-up with long-term survivorship  No orders of the defined types were placed in this encounter.  The patient has a good understanding of the overall plan. she agrees with it. she will call with any problems that may develop before the next visit here.   Harriette Ohara, MD 07/11/18

## 2018-08-25 ENCOUNTER — Encounter: Payer: Self-pay | Admitting: *Deleted

## 2018-09-07 ENCOUNTER — Other Ambulatory Visit: Payer: Self-pay | Admitting: Family Medicine

## 2018-11-22 DIAGNOSIS — Z853 Personal history of malignant neoplasm of breast: Secondary | ICD-10-CM | POA: Diagnosis not present

## 2018-11-22 LAB — HM MAMMOGRAPHY

## 2018-11-24 ENCOUNTER — Encounter: Payer: Self-pay | Admitting: Family Medicine

## 2018-12-21 ENCOUNTER — Telehealth: Payer: Self-pay | Admitting: Family Medicine

## 2018-12-21 NOTE — Telephone Encounter (Signed)
Pt and husband are scheduled for shingles vaccine on 7/29. Please hold.

## 2018-12-21 NOTE — Telephone Encounter (Signed)
Done

## 2018-12-28 ENCOUNTER — Ambulatory Visit (INDEPENDENT_AMBULATORY_CARE_PROVIDER_SITE_OTHER): Payer: Medicare HMO

## 2018-12-28 ENCOUNTER — Other Ambulatory Visit: Payer: Self-pay

## 2018-12-28 DIAGNOSIS — Z23 Encounter for immunization: Secondary | ICD-10-CM

## 2019-01-05 DIAGNOSIS — H26491 Other secondary cataract, right eye: Secondary | ICD-10-CM | POA: Diagnosis not present

## 2019-01-05 DIAGNOSIS — H52203 Unspecified astigmatism, bilateral: Secondary | ICD-10-CM | POA: Diagnosis not present

## 2019-01-05 DIAGNOSIS — Z961 Presence of intraocular lens: Secondary | ICD-10-CM | POA: Diagnosis not present

## 2019-01-10 ENCOUNTER — Telehealth: Payer: Self-pay | Admitting: Family Medicine

## 2019-01-10 NOTE — Telephone Encounter (Signed)
Called patient to schedule AWV, but no answer. Will try to call back at a later time [facility]

## 2019-01-16 ENCOUNTER — Other Ambulatory Visit: Payer: Self-pay | Admitting: Interventional Cardiology

## 2019-02-09 DIAGNOSIS — D0512 Intraductal carcinoma in situ of left breast: Secondary | ICD-10-CM | POA: Diagnosis not present

## 2019-03-16 ENCOUNTER — Other Ambulatory Visit: Payer: Self-pay

## 2019-03-16 ENCOUNTER — Ambulatory Visit (INDEPENDENT_AMBULATORY_CARE_PROVIDER_SITE_OTHER): Payer: Medicare HMO

## 2019-03-16 DIAGNOSIS — Z0000 Encounter for general adult medical examination without abnormal findings: Secondary | ICD-10-CM | POA: Diagnosis not present

## 2019-03-16 NOTE — Progress Notes (Signed)
I have personally reviewed the Medicare Annual Wellness Visit and agree with the assessment and plan.  [name]. Jerline Pain, MD 03/16/2019 3:27 PM

## 2019-03-16 NOTE — Progress Notes (Signed)
This visit is being conducted via phone call due to the COVID-19 pandemic. This patient has given me verbal consent via phone to conduct this visit, patient states they are participating from their home address. Some vital signs may be absent or patient reported.   Patient identification: identified by name, DOB, and current address.   Subjective:   Lisa Greene is a 78 y.o. female who presents for Medicare Annual (Subsequent) preventive examination.  Review of Systems:   Cardiac Risk Factors include: advanced age (>67men, >85 women);dyslipidemia;hypertension     Objective:     Vitals: There were no vitals taken for this visit.  There is no height or weight on file to calculate BMI.  Advanced Directives 03/16/2019 11/06/2016 07/30/2016 05/16/2016 04/30/2016 04/21/2016 01/20/2016  Does Patient Have a Medical Advance Directive? Yes Yes Yes No No Yes Yes  Type of Advance Directive Living will;Healthcare Power of [place] will  Does patient want to make changes to medical advance directive? No - Patient declined - - - - - -  Copy of [place] in Chart? No - copy requested - - - - - -    Tobacco Social History   Tobacco Use  Smoking Status Never Smoker  Smokeless Tobacco Never Used     Counseling given: Not Answered   Clinical Intake:  Pre-visit preparation completed: Yes  Pain : No/denies pain  Diabetes: No  How often do you need to have someone help you when you read instructions, pamphlets, or other written materials from your doctor or pharmacy?: 1 - Never  Interpreter Needed?: No  Information entered by :: Denman George LPN  Past Medical History:  Diagnosis Date  . ALLERGIC RHINITIS 01/31/2008  . Allergy   . Asthma   . BCC (basal cell carcinoma)sup& nod 03/01/2017   right nostril  . Breast cancer ([place])   . Breast cancer of lower-inner quadrant of left female breast ([place]) 11/01/2015  . Cataract   . Colon cancer screening  05/21/2014   No polyps at age 5. Diverticulosis alone-no more colonoscopies.    Marland Kitchen DIVERTICULITIS, HX OF 09/07/2008   pt denies this   . Eosinophilia 02/08/2009  . Fibroid   . Heart murmur    slight per pt.   Marland Kitchen HYPERLIPIDEMIA 12/20/2006  . Hypertension   . Osteopenia   . OSTEOPOROSIS 12/20/2006  . SCC (squamous cell carcinoma) well diff 11/28/2013   left side chin  . Vitamin B 12 deficiency    Past Surgical History:  Procedure Laterality Date  . BREAST LUMPECTOMY WITH NEEDLE LOCALIZATION Left 01/03/2016   Procedure: BREAST re-excision LUMPECTOMY WITH NEEDLE LOCALIZATION;  Surgeon: Rolm Bookbinder, MD;  Location: [facility];  Service: General;  Laterality: Left;  BREAST re-excision LUMPECTOMY WITH NEEDLE LOCALIZATION  . BREAST LUMPECTOMY WITH RADIOACTIVE SEED LOCALIZATION Left 11/28/2015   Procedure: LEFT BREAST LUMPECTOMY WITH BRACKETED RADIOACTIVE SEED LOCALIZATION;  Surgeon: Rolm Bookbinder, MD;  Location: [facility];  Service: General;  Laterality: Left;  . BREAST SURGERY     cysts removed x2  . CATARACT EXTRACTION BILATERAL W/ ANTERIOR VITRECTOMY  2013   bilateral cataracts  . COLONOSCOPY  2005   tics only   . KNEE ARTHROSCOPY     left  . NEUROMA SURGERY     x2 feet  . thyroid duct cyst     Family History  Problem Relation Age of Onset  . COPD Father   . Heart  disease Father        stent placed 11  . Brain cancer Mother   . Colon cancer Neg Hx   . Stomach cancer Neg Hx    Social History   Socioeconomic History  . Marital status: Married    Spouse name: Not on file  . Number of children: Not on file  . Years of education: Not on file  . Highest education level: Not on file  Occupational History  . Occupation: retired  Scientific laboratory technician  . Financial resource strain: Not on file  . Food insecurity    Worry: Not on file    Inability: Not on file  . Transportation needs    Medical: Not on file    Non-medical: Not on file  Tobacco Use   . Smoking status: Never Smoker  . Smokeless tobacco: Never Used  Substance and Sexual Activity  . Alcohol use: No    Alcohol/week: 0.0 standard drinks  . Drug use: No  . Sexual activity: Yes    Partners: Male    Birth control/protection: Post-menopausal  Lifestyle  . Physical activity    Days per week: Not on file    Minutes per session: Not on file  . Stress: Not on file  Relationships  . Social Herbalist on phone: Not on file    Gets together: Not on file    Attends religious service: Not on file    Active member of club or organization: Not on file    Attends meetings of clubs or organizations: Not on file    Relationship status: Not on file  Other Topics Concern  . Not on file  Social History Narrative   Married. No kids- 2 step kids. 5 step grandkids.       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 Albuterol Sulfate (PROAIR RESPICLICK) 123XX123 (90 Base) MCG/ACT AEPB Inhale 1 puff into the lungs every 8 (eight) hours as needed (wheezing/shortness of breath).  Marland Kitchen atorvastatin (LIPITOR) 20 MG tablet TAKE ONE TABLET BY MOUTH DAILY  . Calcium 500-125 MG-UNIT TABS Take 1 tablet by mouth daily.  . cholecalciferol (VITAMIN D) 1000 UNITS tablet Take 2,000 Units by mouth daily.  . fluticasone (FLONASE) 50 MCG/ACT nasal spray Place 1 spray into both nostrils daily as needed.   . fluticasone furoate-vilanterol (BREO ELLIPTA) 100-25 MCG/INH AEPB Inhale 1 puff into the lungs daily.  . irbesartan (AVAPRO) 150 MG tablet TAKE ONE TABLET BY MOUTH DAILY  . metoprolol succinate (TOPROL-XL) 50 MG 24 hr tablet TAKE ONE TABLET BY MOUTH DAILY  . montelukast (SINGULAIR) 10 MG tablet Take 1 tablet by mouth at bedtime.  . Omega-3 Fatty Acids (FISH OIL) 1000 MG CAPS Take 1,000 mg by mouth Nightly.  . [DISCONTINUED] albuterol (PROVENTIL) 90 MCG/ACT  inhaler Inhale 2 puffs into the lungs every 6 (six) hours as needed for wheezing.  . [DISCONTINUED] pantoprazole (PROTONIX) 40 MG tablet Take 1 tablet by mouth daily.   No facility-administered encounter medications on file as of 03/16/2019.     Activities of Daily Living In your present state of health, do you have any difficulty performing the following activities: 03/16/2019  Hearing? N  Vision? N  Difficulty concentrating or making decisions? N  Walking or climbing stairs? N  Dressing or bathing? N  Doing errands, shopping?  N  Preparing Food and eating ? N  Using the Toilet? N  In the past six months, have you accidently leaked urine? N  Do you have problems with loss of bowel control? N  Managing your Medications? N  Managing your Finances? N  Housekeeping or managing your Housekeeping? N  Some recent data might be hidden    Patient Care Team: Marin Olp, MD as PCP - General (Family Medicine) Belva Crome, MD as PCP - Cardiology (Cardiology) Rolm Bookbinder, MD as Consulting Physician (General Surgery) Nicholas Lose, MD as Consulting Physician (Hematology and Oncology) Kyung Rudd, MD as Consulting Physician (Radiation Oncology) Katy Apo, MD as Consulting Physician (Ophthalmology)    Assessment:   This is a routine wellness examination for [place].  Exercise Activities and Dietary recommendations Current Exercise Habits: The patient does not participate in regular exercise at present  Goals    . Exercise 150 minutes per week (moderate activity)     Stay in your garden;  Try your treadmill at home  Will go to [place] park; x 3 per week; in addition to garden work        [place]  03/16/2019 11/08/2017 11/06/2016 04/21/2016 01/20/2016  Falls in the past year? 0 Yes No No Yes  Comment - Tripped over a board in driveway - - -  Number falls in past yr: - 1 - - -  Injury with Fall? 0 Yes - - -  Follow up Falls evaluation completed;Education  provided;Falls prevention discussed - - - -   Is the patient's home free of loose throw rugs in walkways, pet beds, electrical cords, etc?   yes      Grab bars in the bathroom? yes      Handrails on the stairs?   yes      Adequate lighting?   yes   Depression Screen PHQ 2/9 Scores 03/16/2019 11/08/2017 11/06/2016 04/21/2016  PHQ - 2 Score 0 0 0 0     Cognitive Function-no cognitive concerns at this time  MMSE - Mini Mental State Exam 11/06/2016  Not completed: (No Data)     6CIT Screen 03/16/2019  What Year? 0 points  What month? 0 points  What time? 0 points  Count back from 20 0 points  Months in reverse 0 points  Repeat phrase 0 points  Total Score 0    Immunization History  Administered Date(s) Administered  . Influenza Whole 03/02/2011  . Pneumococcal Conjugate-13 01/19/2007, 04/19/2014, 06/06/2015  . Pneumococcal Polysaccharide-23 01/19/2007  . Td 07/02/2002, 09/23/2017  . Tdap 08/22/2012  . Zoster 02/10/2010  . Zoster Recombinat (Shingrix) 12/28/2018    Qualifies for Shingles Vaccine? Patient aware that #2 of series is still needed before 06/30/19  Screening Tests Health Maintenance  Topic Date Due  . INFLUENZA VACCINE  04/19/2034 (Originally 12/31/2018)  . MAMMOGRAM  11/22/2019  . TETANUS/TDAP  09/24/2027  . DEXA SCAN  Completed  . PNA vac Low Risk Adult  Completed    Cancer Screenings: Lung: Low Dose CT Chest recommended if Age 14-80 years, 30 pack-year currently smoking OR have quit w/in 15years. Patient does not qualify. Breast:  Up to date on Mammogram? Yes   Up to date of Bone Density/Dexa? Yes Colorectal: colonoscopy 05/11/14  Plan:  I have personally reviewed and addressed the Medicare Annual Wellness questionnaire and have noted the following in the patient's chart:  A. Medical and social history B. Use of alcohol, tobacco or illicit drugs  C.  Current medications and supplements D. Functional ability and status E.  Nutritional status F.   Physical activity G. Advance directives H. List of other physicians I.  Hospitalizations, surgeries, and ER visits in previous 12 months J.  [place] such as hearing and vision if needed, cognitive and depression L. Referrals, records requested, and appointments- none   In addition, I have reviewed and discussed with patient certain preventive protocols, quality metrics, and best practice recommendations. A written personalized care plan for preventive services as well as general preventive health recommendations were provided to patient.   Signed,  Denman George, LPN  Nurse Health Advisor   Nurse Notes: no additional

## 2019-03-16 NOTE — Patient Instructions (Signed)
Lisa Greene , Thank you for taking time to come for your Medicare Wellness Visit. I appreciate your ongoing commitment to your health goals. Please review the following plan we discussed and let me know if I can assist you in the future.   Screening recommendations/referrals: Colorectal Screening: up to date; last 05/11/14 Mammogram: up to date; last 11/22/18 Bone Density: up to date; last 01/10/18  Vision and Dental Exams: Recommended annual ophthalmology exams for early detection of glaucoma and other disorders of the eye Recommended annual dental exams for proper oral hygiene  Vaccinations: Influenza vaccine:  recommended this fall either at PCP office or through your local pharmacy  Pneumococcal vaccine: up to date; last 06/06/15 Tdap vaccine: up to date; last 09/23/17 Shingles vaccine: Please call your insurance company to determine your out of pocket expense for the Shingrix vaccine. You may receive this vaccine at your local pharmacy.  Advanced directives: Please bring a copy of your POA (Power of Attorney) and/or Living Will to your next appointment.  Goals: Recommend to drink at least 6-8 8oz glasses of water per day and consume a balanced diet with plenty fresh fruits and vegetables.   Next appointment: Please schedule your Annual Wellness Visit with your Nurse Health Advisor in one year. Disregard the voicemail left about changing your appointment.  It will still be on December 10th @ 8 am.   Preventive Care 65 Years and Older, Female Preventive care refers to lifestyle choices and visits with your health care provider that can promote health and wellness. What does preventive care include?  A yearly physical exam. This is also called an annual well check.  Dental exams once or twice a year.  Routine eye exams. Ask your health care provider how often you should have your eyes checked.  Personal lifestyle choices, including:  Daily care of your teeth and gums.  Regular  physical activity.  Eating a healthy diet.  Avoiding tobacco and drug use.  Limiting alcohol use.  Practicing safe sex.  Taking low-dose aspirin every day if recommended by your health care provider.  Taking vitamin and mineral supplements as recommended by your health care provider. What happens during an annual well check? The services and screenings done by your health care provider during your annual well check will depend on your age, overall health, lifestyle risk factors, and family history of disease. Counseling  Your health care provider may ask you questions about your:  Alcohol use.  Tobacco use.  Drug use.  Emotional well-being.  Home and relationship well-being.  Sexual activity.  Eating habits.  History of falls.  Memory and ability to understand (cognition).  Work and work Statistician.  Reproductive health. Screening  You may have the following tests or measurements:  Height, weight, and BMI.  Blood pressure.  Lipid and cholesterol levels. These may be checked every 5 years, or more frequently if you are over 61 years old.  Skin check.  Lung cancer screening. You may have this screening every year starting at age 53 if you have a 30-pack-year history of smoking and currently smoke or have quit within the past 15 years.  Fecal occult blood test (FOBT) of the stool. You may have this test every year starting at age 72.  Flexible sigmoidoscopy or colonoscopy. You may have a sigmoidoscopy every 5 years or a colonoscopy every 10 years starting at age 27.  Hepatitis C blood test.  Hepatitis B blood test.  Sexually transmitted disease (STD) testing.  Diabetes screening.  This is done by checking your blood sugar (glucose) after you have not eaten for a while (fasting). You may have this done every 1-3 years.  Bone density scan. This is done to screen for osteoporosis. You may have this done starting at age 29.  Mammogram. This may be done every  1-2 years. Talk to your health care provider about how often you should have regular mammograms. Talk with your health care provider about your test results, treatment options, and if necessary, the need for more tests. Vaccines  Your health care provider may recommend certain vaccines, such as:  Influenza vaccine. This is recommended every year.  Tetanus, diphtheria, and acellular pertussis (Tdap, Td) vaccine. You may need a Td booster every 10 years.  Zoster vaccine. You may need this after age 67.  Pneumococcal 13-valent conjugate (PCV13) vaccine. One dose is recommended after age 69.  Pneumococcal polysaccharide (PPSV23) vaccine. One dose is recommended after age 64. Talk to your health care provider about which screenings and vaccines you need and how often you need them. This information is not intended to replace advice given to you by your health care provider. Make sure you discuss any questions you have with your health care provider. Document Released: 06/14/2015 Document Revised: 02/05/2016 Document Reviewed: 03/19/2015 Elsevier Interactive Patient Education  2017 [employer] Prevention in the Home Falls can cause injuries. They can happen to people of all ages. There are many things you can do to make your home safe and to help prevent falls. What can I do on the outside of my home?  Regularly fix the edges of walkways and driveways and fix any cracks.  Remove anything that might make you trip as you walk through a door, such as a raised step or threshold.  Trim any bushes or trees on the path to your home.  Use bright outdoor lighting.  Clear any walking paths of anything that might make someone trip, such as rocks or tools.  Regularly check to see if handrails are loose or broken. Make sure that both sides of any steps have handrails.  Any raised decks and porches should have guardrails on the edges.  Have any leaves, snow, or ice cleared regularly.  Use  sand or salt on walking paths during winter.  Clean up any spills in your garage right away. This includes oil or grease spills. What can I do in the bathroom?  Use night lights.  Install grab bars by the toilet and in the tub and shower. Do not use towel bars as grab bars.  Use non-skid mats or decals in the tub or shower.  If you need to sit down in the shower, use a plastic, non-slip stool.  Keep the floor dry. Clean up any water that spills on the floor as soon as it happens.  Remove soap buildup in the tub or shower regularly.  Attach bath mats securely with double-sided non-slip rug tape.  Do not have throw rugs and other things on the floor that can make you trip. What can I do in the bedroom?  Use night lights.  Make sure that you have a light by your bed that is easy to reach.  Do not use any sheets or blankets that are too big for your bed. They should not hang down onto the floor.  Have a firm chair that has side arms. You can use this for support while you get dressed.  Do not have throw rugs and  other things on the floor that can make you trip. What can I do in the kitchen?  Clean up any spills right away.  Avoid walking on wet floors.  Keep items that you use a lot in easy-to-reach places.  If you need to reach something above you, use a strong step stool that has a grab bar.  Keep electrical cords out of the way.  Do not use floor polish or wax that makes floors slippery. If you must use wax, use non-skid floor wax.  Do not have throw rugs and other things on the floor that can make you trip. What can I do with my stairs?  Do not leave any items on the stairs.  Make sure that there are handrails on both sides of the stairs and use them. Fix handrails that are broken or loose. Make sure that handrails are as long as the stairways.  Check any carpeting to make sure that it is firmly attached to the stairs. Fix any carpet that is loose or worn.  Avoid  having throw rugs at the top or bottom of the stairs. If you do have throw rugs, attach them to the floor with carpet tape.  Make sure that you have a light switch at the top of the stairs and the bottom of the stairs. If you do not have them, ask someone to add them for you. What else can I do to help prevent falls?  Wear shoes that:  Do not have high heels.  Have rubber bottoms.  Are comfortable and fit you well.  Are closed at the toe. Do not wear sandals.  If you use a stepladder:  Make sure that it is fully opened. Do not climb a closed stepladder.  Make sure that both sides of the stepladder are locked into place.  Ask someone to hold it for you, if possible.  Clearly mark and make sure that you can see:  Any grab bars or handrails.  First and last steps.  Where the edge of each step is.  Use tools that help you move around (mobility aids) if they are needed. These include:  Canes.  Walkers.  Scooters.  Crutches.  Turn on the lights when you go into a dark area. Replace any light bulbs as soon as they burn out.  Set up your furniture so you have a clear path. Avoid moving your furniture around.  If any of your floors are uneven, fix them.  If there are any pets around you, be aware of where they are.  Review your medicines with your doctor. Some medicines can make you feel dizzy. This can increase your chance of falling. Ask your doctor what other things that you can do to help prevent falls. This information is not intended to replace advice given to you by your health care provider. Make sure you discuss any questions you have with your health care provider. Document Released: 03/14/2009 Document Revised: 10/24/2015 Document Reviewed: 06/22/2014 Elsevier Interactive Patient Education  2017 [employer].

## 2019-03-17 ENCOUNTER — Other Ambulatory Visit: Payer: Self-pay | Admitting: Interventional Cardiology

## 2019-04-07 ENCOUNTER — Other Ambulatory Visit: Payer: Self-pay

## 2019-04-07 ENCOUNTER — Encounter: Payer: Self-pay | Admitting: Family Medicine

## 2019-04-07 ENCOUNTER — Ambulatory Visit (INDEPENDENT_AMBULATORY_CARE_PROVIDER_SITE_OTHER): Payer: Medicare HMO

## 2019-04-07 DIAGNOSIS — Z23 Encounter for immunization: Secondary | ICD-10-CM

## 2019-04-12 DIAGNOSIS — R69 Illness, unspecified: Secondary | ICD-10-CM | POA: Diagnosis not present

## 2019-05-02 ENCOUNTER — Telehealth: Payer: Self-pay | Admitting: Family Medicine

## 2019-05-05 DIAGNOSIS — H1045 Other chronic allergic conjunctivitis: Secondary | ICD-10-CM | POA: Diagnosis not present

## 2019-05-05 DIAGNOSIS — J300 Vasomotor rhinitis: Secondary | ICD-10-CM | POA: Diagnosis not present

## 2019-05-05 DIAGNOSIS — J4540 Moderate persistent asthma, uncomplicated: Secondary | ICD-10-CM | POA: Diagnosis not present

## 2019-05-08 NOTE — Progress Notes (Signed)
Cardiology Office Note:    Date:  05/10/2019   ID:  Lisa Greene, DOB [birth year], MRN [id number]  PCP:  Lisa Olp, MD  Cardiologist:  Lisa Grooms, MD   Referring MD: Lisa Olp, MD   Chief Complaint  Patient presents with  . Hyperlipidemia  . Hypertension    History of Present Illness:    [name] is a 78 y.o. female with a hx of hyperlipidemia, aortic atherosclerosis, and relatively frequent asymptomatic PVCs.  (28% burden by 48-hour monitor 2018)  She denies complaints.  No chest pain, orthopnea, PND, neurological symptoms, edema, or specific medication side effects.  Past Medical History:  Diagnosis Date  . ALLERGIC RHINITIS 01/31/2008  . Allergy   . Asthma   . BCC (basal cell carcinoma)sup& nod 03/01/2017   right nostril  . Breast cancer ([place])   . Breast cancer of lower-inner quadrant of left female breast ([place]) 11/01/2015  . Cataract   . Colon cancer screening 05/21/2014   No polyps at age 58. Diverticulosis alone-no more colonoscopies.    Marland Kitchen DIVERTICULITIS, HX OF 09/07/2008   pt denies this   . Eosinophilia 02/08/2009  . Fibroid   . Heart murmur    slight per pt.   Marland Kitchen HYPERLIPIDEMIA 12/20/2006  . Hypertension   . Osteopenia   . OSTEOPOROSIS 12/20/2006  . SCC (squamous cell carcinoma) well diff 11/28/2013   left side chin  . Vitamin B 12 deficiency     Past Surgical History:  Procedure Laterality Date  . BREAST LUMPECTOMY WITH NEEDLE LOCALIZATION Left 01/03/2016   Procedure: BREAST re-excision LUMPECTOMY WITH NEEDLE LOCALIZATION;  Surgeon: Lisa Bookbinder, MD;  Location: [facility];  Service: General;  Laterality: Left;  BREAST re-excision LUMPECTOMY WITH NEEDLE LOCALIZATION  . BREAST LUMPECTOMY WITH RADIOACTIVE SEED LOCALIZATION Left 11/28/2015   Procedure: LEFT BREAST LUMPECTOMY WITH BRACKETED RADIOACTIVE SEED LOCALIZATION;  Surgeon: Lisa Bookbinder, MD;  Location: [facility];  Service: General;   Laterality: Left;  . BREAST SURGERY     cysts removed x2  . CATARACT EXTRACTION BILATERAL W/ ANTERIOR VITRECTOMY  2013   bilateral cataracts  . COLONOSCOPY  2005   tics only   . FOOT SURGERY    . KNEE ARTHROSCOPY     left  . NEUROMA SURGERY     x2 feet  . thyroid duct cyst      Current Medications: Current Meds  Medication Sig  . Albuterol Sulfate (PROAIR RESPICLICK) 123XX123 (90 Base) MCG/ACT AEPB Inhale 1 puff into the lungs every 8 (eight) hours as needed (wheezing/shortness of breath).  Marland Kitchen atorvastatin (LIPITOR) 20 MG tablet TAKE ONE TABLET BY MOUTH DAILY  . azelastine (ASTELIN) 0.1 % nasal spray Place 2 sprays into both nostrils 2 (two) times daily. Use in each nostril as directed  . azelastine (OPTIVAR) 0.05 % ophthalmic solution   . Calcium 500-125 MG-UNIT TABS Take 1 tablet by mouth daily.  . cetirizine (ZYRTEC) 10 MG tablet Take 10 mg by mouth daily.  . cholecalciferol (VITAMIN D) 1000 UNITS tablet Take 2,000 Units by mouth daily.  . fluticasone (FLONASE) 50 MCG/ACT nasal spray Place 1 spray into both nostrils daily as needed.   . fluticasone furoate-vilanterol (BREO ELLIPTA) 100-25 MCG/INH AEPB Inhale 1 puff into the lungs daily.  . irbesartan (AVAPRO) 150 MG tablet TAKE ONE TABLET BY MOUTH DAILY  . metoprolol succinate (TOPROL-XL) 50 MG 24 hr tablet Take 1 tablet (50 mg total) by mouth daily. Take  with or immediately following a meal.  . montelukast (SINGULAIR) 10 MG tablet Take 1 tablet by mouth at bedtime.  . tamoxifen (NOLVADEX) 20 MG tablet Take 20 mg by mouth daily.  . [DISCONTINUED] metoprolol succinate (TOPROL-XL) 50 MG 24 hr tablet TAKE ONE TABLET BY MOUTH DAILY     Allergies:   Lisinopril, Latex, Penicillins, Sulfonamide derivatives, and Tape   Social History   Socioeconomic History  . Marital status: Married    Spouse name: Not on file  . Number of children: Not on file  . Years of education: Not on file  . Highest education level: Not on file  Occupational  History  . Occupation: retired  Scientific laboratory technician  . Financial resource strain: Not on file  . Food insecurity    Worry: Not on file    Inability: Not on file  . Transportation needs    Medical: Not on file    Non-medical: Not on file  Tobacco Use  . Smoking status: Never Smoker  . Smokeless tobacco: Never Used  Substance and Sexual Activity  . Alcohol use: No    Alcohol/week: 0.0 standard drinks  . Drug use: No  . Sexual activity: Yes    Partners: Male    Birth control/protection: Post-menopausal  Lifestyle  . Physical activity    Days per week: Not on file    Minutes per session: Not on file  . Stress: Not on file  Relationships  . Social Herbalist on phone: Not on file    Gets together: Not on file    Attends religious service: Not on file    Active member of club or organization: Not on file    Attends meetings of clubs or organizations: Not on file    Relationship status: Not on file  Other Topics Concern  . Not on file  Social History Narrative   Married. No kids- 2 step kids. 5 step grandkids.       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 There is no history of Colon cancer or Stomach cancer.  ROS:   Please see the history of present illness.    Physically active without limitations.  All other systems reviewed and are negative.  EKGs/Labs/Other Studies Reviewed:    The following studies were reviewed today: No new imaging or functional data.  EKG:  EKG sinus bradycardia, 57 bpm.  Nonspecific ST abnormality.  When compared to the most recent prior tracing from October 2019, no significant change has occurred with the exception that PVCs are no longer present.  Recent Labs: No results found  for requested labs within last 8760 hours.  Recent Lipid Panel    Component Value Date/Time   CHOL 213 (H) 11/08/2017 1010   TRIG 90.0 11/08/2017 1010   HDL 59.70 11/08/2017 1010   CHOLHDL 4 11/08/2017 1010   VLDL 18.0 11/08/2017 1010   LDLCALC 135 (H) 11/08/2017 1010   LDLDIRECT 134.0 02/25/2015 0854    Physical Exam:    VS:  BP (!) 146/82   Pulse (!) 57   Ht 5\' 2"  (1.575 m)   Wt 151 lb 12.8 oz (68.9 kg)   SpO2 99%   BMI 27.76 kg/m  Wt Readings from Last 3 Encounters:  05/10/19 151 lb 12.8 oz (68.9 kg)  07/11/18 153 lb 8 oz (69.6 kg)  05/16/18 149 lb 3.2 oz (67.7 kg)     GEN: Mild increase in weight. No acute distress HEENT: Normal NECK: No JVD. LYMPHATICS: No lymphadenopathy CARDIAC:  RRR without murmur, gallop, or edema. VASCULAR:  Normal Pulses. No bruits. RESPIRATORY:  Clear to auscultation without rales, wheezing or rhonchi  ABDOMEN: Soft, non-tender, non-distended, No pulsatile mass, MUSCULOSKELETAL: No deformity  SKIN: Warm and dry NEUROLOGIC:  Alert and oriented x 3 PSYCHIATRIC:  Normal affect   ASSESSMENT:    1. Aortic atherosclerosis ([place])   2. Hyperlipidemia, unspecified hyperlipidemia type   3. Essential hypertension   4. Stage 3b chronic kidney disease   5. Educated about COVID-19 virus infection    PLAN:    In order of problems listed above:  1. Primary prevention measures are discussed.  Think we try control of risk factors\ 2. LDL target less than 70. 3. Blood pressure target 130/80 mmHg. 4. Most recent creatinine in 2019 was normal. 5. 3W's is endorsed and being practiced.  Monitor blood pressure.  If remains above [id number] mmHg systolic will need to make upward adjustments in antihypertensive regimen.  It is a months LDL is lowered to less than 70.  Will await laboratory data from Dr. Yong Channel which will be done tomorrow according to the patient.  Statin therapy may need to be increased in intensity..  Overall education and awareness  concerning primary/secondary risk prevention was discussed in detail: LDL less than 70, hemoglobin A1c less than 7, blood pressure target less than 130/80 mmHg, >150 minutes of moderate aerobic activity per week, avoidance of smoking, weight control (via diet and exercise), and continued surveillance/management of/for obstructive sleep apnea.    Medication Adjustments/Labs and Tests Ordered: Current medicines are reviewed at length with the patient today.  Concerns regarding medicines are outlined above.  Orders Placed This Encounter  Procedures  . EKG 12-Lead   Meds ordered this encounter  Medications  . metoprolol succinate (TOPROL-XL) 50 MG 24 hr tablet    Sig: Take 1 tablet (50 mg total) by mouth daily. Take with or immediately following a meal.    Dispense:  90 tablet    Refill:  3    Patient Instructions  Medication Instructions:  Your physician recommends that you continue on your current medications as directed. Please refer to the Current Medication list given to you today.  *If you need a refill on your cardiac medications before your next appointment, please call your pharmacy*  Lab Work: None If you have labs (blood work) drawn today and your tests are completely normal, you will receive your results only by: Marland Kitchen MyChart Message (if you have MyChart) OR . A paper copy in the mail If you have any lab test that is abnormal or we need to change your treatment, we will call you to review the results.  Testing/Procedures: None  Follow-Up: At [facility], you and your health needs are our priority.  As part of our continuing mission to provide you with exceptional heart care, we have created designated Provider Care Teams.  These Care Teams include your primary Cardiologist (physician) and Advanced Practice Providers (APPs -  Physician Assistants and Nurse Practitioners) who all work together to provide you with the care you need, when you need it.  Your next appointment:    12 month(s)  The format for your next appointment:  In Person  Provider:   You may see Lisa Grooms, MD or one of the following Advanced Practice Providers on your designated Care Team:    Truitt Merle, NP  Cecilie Kicks, NP  Kathyrn Drown, NP   Other Instructions      Signed, Lisa Grooms, MD  05/10/2019 4:50 PM    [place]

## 2019-05-10 ENCOUNTER — Other Ambulatory Visit: Payer: Self-pay

## 2019-05-10 ENCOUNTER — Encounter: Payer: Self-pay | Admitting: Family Medicine

## 2019-05-10 ENCOUNTER — Ambulatory Visit: Payer: Medicare HMO | Admitting: Interventional Cardiology

## 2019-05-10 VITALS — BP 146/82 | HR 57 | Ht 62.0 in | Wt 151.8 lb

## 2019-05-10 DIAGNOSIS — E785 Hyperlipidemia, unspecified: Secondary | ICD-10-CM | POA: Diagnosis not present

## 2019-05-10 DIAGNOSIS — I129 Hypertensive chronic kidney disease with stage 1 through stage 4 chronic kidney disease, or unspecified chronic kidney disease: Secondary | ICD-10-CM

## 2019-05-10 DIAGNOSIS — I10 Essential (primary) hypertension: Secondary | ICD-10-CM | POA: Diagnosis not present

## 2019-05-10 DIAGNOSIS — I700 Atherosclerosis of aorta: Secondary | ICD-10-CM | POA: Diagnosis not present

## 2019-05-10 DIAGNOSIS — J4540 Moderate persistent asthma, uncomplicated: Secondary | ICD-10-CM | POA: Insufficient documentation

## 2019-05-10 DIAGNOSIS — Z7189 Other specified counseling: Secondary | ICD-10-CM

## 2019-05-10 DIAGNOSIS — J300 Vasomotor rhinitis: Secondary | ICD-10-CM

## 2019-05-10 DIAGNOSIS — N1832 Chronic kidney disease, stage 3b: Secondary | ICD-10-CM | POA: Diagnosis not present

## 2019-05-10 DIAGNOSIS — H1045 Other chronic allergic conjunctivitis: Secondary | ICD-10-CM

## 2019-05-10 MED ORDER — METOPROLOL SUCCINATE ER 50 MG PO TB24: 50.0000 mg | ORAL_TABLET | Freq: Every day | ORAL | 3 refills | Status: DC

## 2019-05-10 NOTE — Patient Instructions (Signed)

## 2019-05-11 ENCOUNTER — Encounter: Payer: Self-pay | Admitting: Family Medicine

## 2019-05-11 ENCOUNTER — Ambulatory Visit (INDEPENDENT_AMBULATORY_CARE_PROVIDER_SITE_OTHER): Payer: Medicare HMO | Admitting: Family Medicine

## 2019-05-11 VITALS — BP 138/78 | HR 58 | Temp 97.2°F | Ht 62.0 in | Wt 150.8 lb

## 2019-05-11 DIAGNOSIS — I10 Essential (primary) hypertension: Secondary | ICD-10-CM | POA: Diagnosis not present

## 2019-05-11 DIAGNOSIS — I493 Ventricular premature depolarization: Secondary | ICD-10-CM

## 2019-05-11 DIAGNOSIS — Z0000 Encounter for general adult medical examination without abnormal findings: Secondary | ICD-10-CM | POA: Diagnosis not present

## 2019-05-11 DIAGNOSIS — Z853 Personal history of malignant neoplasm of breast: Secondary | ICD-10-CM | POA: Diagnosis not present

## 2019-05-11 DIAGNOSIS — I700 Atherosclerosis of aorta: Secondary | ICD-10-CM | POA: Diagnosis not present

## 2019-05-11 DIAGNOSIS — N1832 Chronic kidney disease, stage 3b: Secondary | ICD-10-CM

## 2019-05-11 DIAGNOSIS — M85851 Other specified disorders of bone density and structure, right thigh: Secondary | ICD-10-CM

## 2019-05-11 DIAGNOSIS — E785 Hyperlipidemia, unspecified: Secondary | ICD-10-CM | POA: Diagnosis not present

## 2019-05-11 DIAGNOSIS — E538 Deficiency of other specified B group vitamins: Secondary | ICD-10-CM | POA: Diagnosis not present

## 2019-05-11 DIAGNOSIS — J4540 Moderate persistent asthma, uncomplicated: Secondary | ICD-10-CM | POA: Diagnosis not present

## 2019-05-11 LAB — COMPREHENSIVE METABOLIC PANEL
ALT: 24 U/L (ref 0–35)
AST: 25 U/L (ref 0–37)
Albumin: 4.1 g/dL (ref 3.5–5.2)
Alkaline Phosphatase: 68 U/L (ref 39–117)
BUN: 16 mg/dL (ref 6–23)
CO2: 27 mEq/L (ref 19–32)
Calcium: 9.3 mg/dL (ref 8.4–10.5)
Chloride: 104 mEq/L (ref 96–112)
Creatinine, Ser: 1.15 mg/dL (ref 0.40–1.20)
GFR: 45.54 mL/min — ABNORMAL LOW (ref >60.00)
Glucose, Bld: 93 mg/dL (ref 70–99)
Potassium: 4.4 mEq/L (ref 3.5–5.1)
Sodium: 139 mEq/L (ref 135–145)
Total Bilirubin: 0.9 mg/dL (ref 0.2–1.2)
Total Protein: 6.3 g/dL (ref 6.0–8.3)

## 2019-05-11 LAB — CBC WITH DIFFERENTIAL/PLATELET
Basophils Absolute: 0.1 10*3/uL (ref 0.0–0.1)
Basophils Relative: 1.2 % (ref 0.0–3.0)
Eosinophils Absolute: 0.4 10*3/uL (ref 0.0–0.7)
Eosinophils Relative: 5 % (ref 0.0–5.0)
HCT: 37.6 % (ref 36.0–46.0)
Hemoglobin: 12.6 g/dL (ref 12.0–15.0)
Lymphocytes Relative: 16 % (ref 12.0–46.0)
Lymphs Abs: 1.2 10*3/uL (ref 0.7–4.0)
MCHC: 33.4 g/dL (ref 30.0–36.0)
MCV: 91.4 fl (ref 78.0–100.0)
Monocytes Absolute: 0.6 10*3/uL (ref 0.1–1.0)
Monocytes Relative: 7.8 % (ref 3.0–12.0)
Neutro Abs: 5.2 10*3/uL (ref 1.4–7.7)
Neutrophils Relative %: 70 % (ref 43.0–77.0)
Platelets: 261 10*3/uL (ref 150.0–400.0)
RBC: 4.11 Mil/uL (ref 3.87–5.11)
RDW: 13.1 % (ref 11.5–15.5)
WBC: 7.5 10*3/uL (ref 4.0–10.5)

## 2019-05-11 LAB — LIPID PANEL
Cholesterol: 155 mg/dL (ref 0–200)
HDL: 72.4 mg/dL (ref >39.00)
LDL Cholesterol: 67 mg/dL (ref 0–99)
NonHDL: 82.25
Total CHOL/HDL Ratio: 2
Triglycerides: 76 mg/dL (ref 0.0–149.0)
VLDL: 15.2 mg/dL (ref 0.0–40.0)

## 2019-05-11 LAB — VITAMIN D 25 HYDROXY (VIT D DEFICIENCY, FRACTURES): VITD: 39 ng/mL (ref 30.00–100.00)

## 2019-05-11 LAB — VITAMIN B12: Vitamin B-12: 988 pg/mL — ABNORMAL HIGH (ref 211–911)

## 2019-05-11 MED ORDER — IRBESARTAN 150 MG PO TABS: 150.0000 mg | ORAL_TABLET | Freq: Every day | ORAL | 3 refills | Status: DC

## 2019-05-11 NOTE — Progress Notes (Signed)
Phone: [phone]   Subjective:  Patient presents today for their annual physical. Chief complaint-noted.   See problem oriented charting- Review of Systems  Constitutional: Negative.   HENT: Negative.   Eyes: Negative.   Respiratory: Negative.   Cardiovascular: Negative.   Gastrointestinal: Negative.   Genitourinary: Negative.   Musculoskeletal: Negative.   Neurological: Negative.   Endo/Heme/Allergies: Positive for environmental allergies. Bruises/bleeds easily.  Psychiatric/Behavioral: Negative.   some throat clearing after taking breo  The following were reviewed and entered/updated in epic: Past Medical History:  Diagnosis Date  . ALLERGIC RHINITIS 01/31/2008  . Allergy   . Asthma   . BCC (basal cell carcinoma)sup& nod 03/01/2017   right nostril  . Breast cancer ([place])   . Breast cancer of lower-inner quadrant of left female breast ([place]) 11/01/2015  . Cataract   . Colon cancer screening 05/21/2014   No polyps at age 39. Diverticulosis alone-no more colonoscopies.    Marland Kitchen DIVERTICULITIS, HX OF 09/07/2008   pt denies this   . Eosinophilia 02/08/2009  . Fibroid   . Heart murmur    slight per pt.   Marland Kitchen HYPERLIPIDEMIA 12/20/2006  . Hypertension   . Osteopenia   . OSTEOPOROSIS 12/20/2006  . SCC (squamous cell carcinoma) well diff 11/28/2013   left side chin  . Vitamin B 12 deficiency    Patient Active Problem List   Diagnosis Date Noted  . Frequent PVCs 04/14/2017    Priority: High  . History of breast cancer- Breast cancer of lower-inner quadrant of left female breast 11/01/2015    Priority: High  . Low vitamin B12 level 06/24/2016    Priority: Medium  . CKD (chronic kidney disease), stage III 10/22/2015    Priority: Medium  . Asthma, moderate persistent, well-controlled 04/19/2014    Priority: Medium  . Hypertension 11/25/2010    Priority: Medium  . Hyperlipidemia 12/20/2006    Priority: Medium  . Osteopenia 12/20/2006    Priority: Medium  . Vasomotor rhinitis  05/10/2019    Priority: Low  . Chronic allergic conjunctivitis 05/10/2019    Priority: Low  . Aortic atherosclerosis ([place]) 11/23/2016    Priority: Low  . Family history of abdominal aortic aneurysm 03/08/2015    Priority: Low  . Eosinophilia 02/08/2009    Priority: Low  . Allergic rhinitis 01/31/2008    Priority: Low   Past Surgical History:  Procedure Laterality Date  . BREAST LUMPECTOMY WITH NEEDLE LOCALIZATION Left 01/03/2016   Procedure: BREAST re-excision LUMPECTOMY WITH NEEDLE LOCALIZATION;  Surgeon: Rolm Bookbinder, MD;  Location: [facility];  Service: General;  Laterality: Left;  BREAST re-excision LUMPECTOMY WITH NEEDLE LOCALIZATION  . BREAST LUMPECTOMY WITH RADIOACTIVE SEED LOCALIZATION Left 11/28/2015   Procedure: LEFT BREAST LUMPECTOMY WITH BRACKETED RADIOACTIVE SEED LOCALIZATION;  Surgeon: Rolm Bookbinder, MD;  Location: [facility];  Service: General;  Laterality: Left;  . BREAST SURGERY     cysts removed x2  . CATARACT EXTRACTION BILATERAL W/ ANTERIOR VITRECTOMY  2013   bilateral cataracts  . COLONOSCOPY  2005   tics only   . FOOT SURGERY    . KNEE ARTHROSCOPY     left  . NEUROMA SURGERY     x2 feet  . thyroid duct cyst      Family History  Problem Relation Age of Onset  . COPD Father   . Heart disease Father        stent placed 72  . Allergic rhinitis Father   . Brain cancer Mother   .  Allergic rhinitis Maternal Grandmother   . Colon cancer Neg Hx   . Stomach cancer Neg Hx     Medications- reviewed and updated Current Outpatient Medications  Medication Sig Dispense Refill  . Albuterol Sulfate (PROAIR RESPICLICK) 123XX123 (90 Base) MCG/ACT AEPB Inhale 1 puff into the lungs every 8 (eight) hours as needed (wheezing/shortness of breath).    Marland Kitchen atorvastatin (LIPITOR) 20 MG tablet TAKE ONE TABLET BY MOUTH DAILY 90 tablet 4  . azelastine (ASTELIN) 0.1 % nasal spray Place 2 sprays into both nostrils 2 (two) times daily. Use in each  nostril as directed    . azelastine (OPTIVAR) 0.05 % ophthalmic solution     . Calcium 500-125 MG-UNIT TABS Take 1 tablet by mouth daily.    . cetirizine (ZYRTEC) 10 MG tablet Take 10 mg by mouth daily.    . cholecalciferol (VITAMIN D) 1000 UNITS tablet Take 2,000 Units by mouth daily.    . fluticasone (FLONASE) 50 MCG/ACT nasal spray Place 1 spray into both nostrils daily as needed.     . fluticasone furoate-vilanterol (BREO ELLIPTA) 100-25 MCG/INH AEPB Inhale 1 puff into the lungs daily.    . irbesartan (AVAPRO) 150 MG tablet TAKE ONE TABLET BY MOUTH DAILY 90 tablet 10  . metoprolol succinate (TOPROL-XL) 50 MG 24 hr tablet Take 1 tablet (50 mg total) by mouth daily. Take with or immediately following a meal. 90 tablet 3  . montelukast (SINGULAIR) 10 MG tablet Take 1 tablet by mouth at bedtime.     No current facility-administered medications for this visit.    Allergies-reviewed and updated Allergies  Allergen Reactions  . Lisinopril Other (See Comments) and Cough    Dry cough and stinging rash  . Latex Rash  . Penicillins Rash    Has patient had a PCN reaction causing immediate rash, facial/tongue/throat swelling, SOB or lightheadedness with hypotension: Yes Has patient had a PCN reaction causing severe rash involving mucus membranes or skin necrosis: No Has patient had a PCN reaction that required hospitalization No Has patient had a PCN reaction occurring within the last 10 years: No If all of the above answers are "NO", then may proceed with Cephalosporin use.   . Sulfonamide Derivatives Rash  . Tape Rash    Band-aids break out skin!!    Social History   Social History Narrative   Married. No kids- 2 step kids. 5 step grandkids.       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 TM normal. Bridge of nose normal. Eyelids normal.  Neck: no thyromegaly or cervical lymphadenopathy  CV: RRR no murmurs rubs or gallops Lungs: CTAB no crackles, wheeze, rhonchi Abdomen: soft/nontender/nondistended/normal bowel sounds. No rebound or guarding.  Ext: no edema Skin: warm, dry Neuro: grossly normal, moves all extremities, PERRLA   Assessment and Plan   78 y.o. female presenting for annual physical.  Health Maintenance counseling: 1. Anticipatory guidance: Patient counseled regarding regular dental exams q6 months, eye exams q1 yearly,  avoiding smoking and second hand smoke , limiting alcohol to 1 beverage per  day .  Does not drink at all.  2. Risk factor reduction:  Advised patient of need for regular exercise and diet rich and fruits and vegetables to reduce risk of heart attack and stroke. Exercise- walking daily, also doing some squats and band exercises.  Diet- eats healthy options, BMI Slightly elevated but at her age would not focus heavily on weight loss- modest weight loss ok  Wt Readings from Last 3 Encounters:  05/11/19 150 lb 12.8 oz (68.4 kg)  05/10/19 151 lb 12.8 oz (68.9 kg)  07/11/18 153 lb 8 oz (69.6 kg)  3. Immunizations/screenings/ancillary studies-fully up-to-date, insurance covered shingrix and she needs final shot today Immunization History  Administered Date(s) Administered  . Fluad Quad(high Dose 65+) 04/07/2019  . Influenza Whole 03/02/2011  . Pneumococcal Conjugate-13 01/19/2007, 04/19/2014, 06/06/2015  . Pneumococcal Polysaccharide-23 01/19/2007  . Td 07/02/2002, 09/23/2017  . Tdap 08/22/2012  . Zoster 02/10/2010  . Zoster Recombinat (Shingrix) 12/28/2018   4. Cervical cancer screening- past age based screening recommendations-declines ever having abnormal Pap smear 5. Breast cancer screening-with history of breast cancer  continues yearly mammograms-last done in June 2020.  Follows with Dr. Lindi Adie- she has been taken off tamoxifen 6. Colon cancer screening - past age based screening recommendations.  Last completed Oct 09, 2013 with no recall due to age  64. Skin cancer screening-follows with Dr. Denna Haggard once a year. Advised regular sunscreen use. Denies worrisome, changing, or new skin lesions.  8. Birth control/STD check- monogamous and postmenopausal 9. Osteoporosis screening at 33- see discussion below -Never smoker  Status of chronic or acute concerns   Hyperlipidemia-patient compliant with atorvastatin 20 mg.  Update lipid panel today.  For primary prevention only-Dr. Tamala Julian would like [place] to target LDL under 70-titrate medication up if elevated   Essential hypertension-compliant with irbesartan 150 mg and metoprolol 50 mg extended release-well-controlled on repeat. Home #s 120s or low 130s usually and diastolic controlled.   Stage 3b chronic kidney disease-continue to avoid NSAIDs.  She is on ARB in case proteinuric element.  Discussed holding irbesartan if had dehydration/GI bug. Update BMP today   Asthma, moderate persistent, well-controlled.  Patient is on Breo and Singulair.  She has albuterol as needed available.  She follows closely with Dr. Donneta Romberg- just saw on [place] also helps her allergies along with Astelin and Flonase and Zyrtec   Osteopenia of neck of right femur-patient has declined Fosamax in the past-repeat bone density August 2019 with 1% decrease despite not being on Fosamax-she has wanted to continue to monitor every 2 years.  She takes 1000 units of vitamin D per day and 1200 mg of calcium.  We will update a vitamin D level today    Low vitamin B12 level-patient currently taking b12 OTC.Marland Kitchen  Update B12 level today.  Malignant neoplasm of lower-inner quadrant of left breast in female, estrogen receptor positive (HCC)-patient now off tamoxifen due to recurrent UTI. Close follow-up  with oncology and Dr. Donne Hazel. Change to history of now off tamoxifen  Frequent PVCs-patient compliant with metoprolol 50 mg single release and finds this helpful.  Follows closely with Dr. Tamala Julian. No symptoms at all  Aortic atherosclerosis (HCC)-continue risk factor modification.  Incidental finding on prior imaging   Injury to left thumb years ago. Some crepitus on exam. Discussed trial voltaren gel- should be ok even with her kidneys.   Recommended follow up: Return in about 6 months (around 11/09/2019) for follow up- or sooner if needed. Future Appointments  Date  Time Provider [place]  07/14/2019 11:00 AM Causey, Charlestine Massed, NP [facility] None   Lab/Order associations: fasting   ICD-10-CM   1. Preventative health care  Z00.00 Vitamin B12    CBC with Differential/Platelet    Comprehensive metabolic panel    Lipid panel    Vitamin D  (25 hydroxy )  2. Hyperlipidemia, unspecified hyperlipidemia type  E78.5 CBC with Differential/Platelet    Comprehensive metabolic panel    Lipid panel  3. Essential hypertension  I10   4. Stage 3b chronic kidney disease  N18.32   5. Asthma, moderate persistent, well-controlled  J45.40   6. Osteopenia of neck of right femur  M85.851 Vitamin D  (25 hydroxy )  7. Low vitamin B12 level  E53.8 Vitamin B12  8. History of breast cancer  Z85.3   9. Frequent PVCs  I49.3   10. Aortic atherosclerosis (HCC)  I70.0     Meds ordered this encounter  Medications  . irbesartan (AVAPRO) 150 MG tablet    Sig: Take 1 tablet (150 mg total) by mouth daily.    Dispense:  90 tablet    Refill:  3    Return precautions advised.  Garret Reddish, MD

## 2019-05-11 NOTE — Patient Instructions (Addendum)
Please stop by lab before you go If you do not have mychart- we will call you about results within 5 business days of [place] receiving them.  If you have mychart- we will send your results within 3 business days of [place] receiving them.  If abnormal or we want to clarify a result, we will call or mychart you to make sure you receive the message.  If you have questions or concerns or don't hear within 5-7 days, please send [place] a message or call us.   Final shingrix she will schedule at desk for after Christmas- patient has confirmed with insurance that this is covered.   Recommended follow up: Return in about 6 months (around 11/09/2019) for follow up- or sooner if needed.

## 2019-05-30 ENCOUNTER — Ambulatory Visit: Payer: Medicare HMO

## 2019-06-08 ENCOUNTER — Ambulatory Visit: Payer: Medicare HMO

## 2019-06-13 ENCOUNTER — Other Ambulatory Visit: Payer: Self-pay | Admitting: Family Medicine

## 2019-06-13 DIAGNOSIS — E785 Hyperlipidemia, unspecified: Secondary | ICD-10-CM

## 2019-06-22 ENCOUNTER — Ambulatory Visit: Payer: Medicare HMO | Attending: Internal Medicine

## 2019-06-22 DIAGNOSIS — Z23 Encounter for immunization: Secondary | ICD-10-CM

## 2019-06-22 NOTE — Progress Notes (Signed)
   Covid-19 Vaccination Clinic  Name:  [name]    MRN: [id number] DOB: Oct 22, 1940  06/22/2019  [name] was observed post Covid-19 immunization for 15 minutes without incidence. She was provided with Vaccine Information Sheet and instruction to access the V-Safe system.   [name] was instructed to call 911 with any severe reactions post vaccine: Marland Kitchen Difficulty breathing  . Swelling of your face and throat  . A fast heartbeat  . A bad rash all over your body  . Dizziness and weakness    Immunizations Administered    Name Date Dose VIS Date Route   Pfizer COVID-19 Vaccine 06/22/2019  9:09 AM 0.3 mL 05/12/2019 Intramuscular   Manufacturer: [place]   Lot: [id number]   [place]: [id number]

## 2019-06-29 ENCOUNTER — Telehealth: Payer: Self-pay | Admitting: Adult Health

## 2019-06-29 NOTE — Telephone Encounter (Signed)
When calling patient to reschedule she asked to cancel. She said she didn't need it

## 2019-07-13 ENCOUNTER — Ambulatory Visit: Payer: Medicare HMO | Attending: Internal Medicine

## 2019-07-13 DIAGNOSIS — Z23 Encounter for immunization: Secondary | ICD-10-CM | POA: Insufficient documentation

## 2019-07-13 NOTE — Progress Notes (Signed)
   Covid-19 Vaccination Clinic  Name:  PRINCE NAYYAR    MRN: [id number] DOB: 10-20-1940  07/13/2019  Ms. Dugo was observed post Covid-19 immunization for 15 minutes without incidence. She was provided with Vaccine Information Sheet and instruction to access the V-Safe system.   Ms. Boschetti was instructed to call 911 with any severe reactions post vaccine: Marland Kitchen Difficulty breathing  . Swelling of your face and throat  . A fast heartbeat  . A bad rash all over your body  . Dizziness and weakness    Immunizations Administered    Name Date Dose VIS Date Route   Pfizer COVID-19 Vaccine 07/13/2019  9:30 AM 0.3 mL 05/12/2019 Intramuscular   Manufacturer: [place]   Lot: [id number]   [place]: [id number]

## 2019-07-14 ENCOUNTER — Encounter: Payer: Medicare HMO | Admitting: Adult Health

## 2019-09-13 ENCOUNTER — Other Ambulatory Visit: Payer: Self-pay | Admitting: Family Medicine

## 2019-10-16 ENCOUNTER — Ambulatory Visit (INDEPENDENT_AMBULATORY_CARE_PROVIDER_SITE_OTHER): Payer: Medicare HMO | Admitting: Family Medicine

## 2019-10-16 ENCOUNTER — Other Ambulatory Visit: Payer: Self-pay

## 2019-10-16 ENCOUNTER — Encounter: Payer: Self-pay | Admitting: Family Medicine

## 2019-10-16 VITALS — BP 142/80 | HR 58 | Temp 98.4°F | Ht 62.0 in | Wt 153.0 lb

## 2019-10-16 DIAGNOSIS — I10 Essential (primary) hypertension: Secondary | ICD-10-CM

## 2019-10-16 DIAGNOSIS — I700 Atherosclerosis of aorta: Secondary | ICD-10-CM

## 2019-10-16 DIAGNOSIS — N1832 Chronic kidney disease, stage 3b: Secondary | ICD-10-CM | POA: Diagnosis not present

## 2019-10-16 DIAGNOSIS — E785 Hyperlipidemia, unspecified: Secondary | ICD-10-CM

## 2019-10-16 NOTE — Patient Instructions (Addendum)
No changes today as long as home blood pressure continues to average less than 140/90.  Schedule bloodwork at the desk  Recommended follow up: Return in about 30 weeks (around 05/13/2020) for physical or sooner if needed.

## 2019-10-16 NOTE — Progress Notes (Signed)
Phone [phone] In person visit   Subjective:   Lisa Greene is a 79 y.o. year old very pleasant female patient who presents for/with See problem oriented charting Chief Complaint  Patient presents with  . Hypertension   This visit occurred during the SARS-CoV-2 public health emergency.  Safety protocols were in place, including screening questions prior to the visit, additional usage of staff PPE, and extensive cleaning of exam room while observing appropriate contact time as indicated for disinfecting solutions.   Past Medical History-  Patient Active Problem List   Diagnosis Date Noted  . Frequent PVCs 04/14/2017    Priority: High  . History of breast cancer- Breast cancer of lower-inner quadrant of left female breast 11/01/2015    Priority: High  . Low vitamin B12 level 06/24/2016    Priority: Medium  . CKD (chronic kidney disease), stage III 10/22/2015    Priority: Medium  . Asthma, moderate persistent, well-controlled 04/19/2014    Priority: Medium  . Hypertension 11/25/2010    Priority: Medium  . Hyperlipidemia 12/20/2006    Priority: Medium  . Osteopenia 12/20/2006    Priority: Medium  . Vasomotor rhinitis 05/10/2019    Priority: Low  . Chronic allergic conjunctivitis 05/10/2019    Priority: Low  . Aortic atherosclerosis ([place]) 11/23/2016    Priority: Low  . Family history of abdominal aortic aneurysm 03/08/2015    Priority: Low  . Eosinophilia 02/08/2009    Priority: Low  . Allergic rhinitis 01/31/2008    Priority: Low    Medications- reviewed and updated Current Outpatient Medications  Medication Sig Dispense Refill  . Albuterol Sulfate (PROAIR RESPICLICK) [id number] (90 Base) MCG/ACT AEPB Inhale 1 puff into the lungs every 8 (eight) hours as needed (wheezing/shortness of breath).    Marland Kitchen atorvastatin (LIPITOR) 20 MG tablet TAKE ONE TABLET BY MOUTH DAILY 58 tablet 3  . azelastine (OPTIVAR) 0.05 % ophthalmic solution     . Calcium 500-125 MG-UNIT TABS Take 1  tablet by mouth daily.    . cetirizine (ZYRTEC) 10 MG tablet Take 10 mg by mouth daily.    . cholecalciferol (VITAMIN D) 1000 UNITS tablet Take 2,000 Units by mouth daily.    . fluticasone (FLONASE) 50 MCG/ACT nasal spray Place 1 spray into both nostrils daily as needed.     . fluticasone furoate-vilanterol (BREO ELLIPTA) 100-25 MCG/INH AEPB Inhale 1 puff into the lungs daily.    . irbesartan (AVAPRO) 150 MG tablet TAKE ONE TABLET BY MOUTH DAILY 90 tablet 9  . metoprolol succinate (TOPROL-XL) 50 MG 24 hr tablet Take 1 tablet (50 mg total) by mouth daily. Take with or immediately following a meal. 90 tablet 3  . montelukast (SINGULAIR) 10 MG tablet Take 1 tablet by mouth at bedtime.     No current facility-administered medications for this visit.     Objective:  BP (!) 142/80   Pulse (!) 58   Temp 98.4 F (36.9 C) (Temporal)   Ht 5\' 2"  (1.575 m)   Wt 153 lb (69.4 kg)   SpO2 97%   BMI 27.98 kg/m  Gen: NAD, resting comfortably CV: RRR no murmurs rubs or gallops Lungs: CTAB no crackles, wheeze, rhonchi Abdomen: soft/nontender/nondistended/normal bowel sounds.  Ext: no edema Skin: warm, dry     Assessment and Plan   #hypertension/CKD III S: medication: irbesartan 150 mg and metoprolol 50 mg extended release  Home readings #s: home readings 105-138/ 63-74  GFR stable around 45 on last check BP Readings from Last  3 Encounters:  10/16/19 (!) 142/80  05/11/19 138/78  05/10/19 (!) 146/82  A/P: Excellent home blood pressure readings.  Mildly elevated in office today-since home readings look so good we opted to continue current medication-especially with the fact she has had a whole number get as low as 105/63 we do not want to over treat and cause symptoms.  Regardless she is also at goal per [facility] 8  Hopefully kidneys stable- will return for labs. She knows to avoid nsaids.   #hyperlipidemia/aortic atherosclerosis- incidental finding S: Medication:atorvastatin 20mg  Lifestyle:  pretty much cut out beef. Doing chicken and vegetables.  Rare hamburger Lab Results  Component Value Date   CHOL 155 05/11/2019   HDL 72.40 05/11/2019   LDLCALC 67 05/11/2019   LDLDIRECT 134.0 02/25/2015   TRIG 76.0 05/11/2019   CHOLHDL 2 05/11/2019   A/P: Hopefully hyperlipidemia remains at goal of 70 or less-this would be ideal given history of aortic atherosclerosis.  Patient will come back for direct LDL.  Likely continue current medication.  # mild fatigue and nervousness- wonders if sodium is off. Improved with increasing sodium intake/food intake.   Recommended follow up: Return in about 30 weeks (around 05/13/2020) for physical or sooner if needed. Future Appointments  Date Time Provider [place]  05/15/2020  9:20 AM Marin Olp, MD [facility]   Lab/Order associations:   ICD-10-CM   1. Essential hypertension  I10 CBC with Differential/Platelet    Comprehensive metabolic panel    LDL cholesterol, direct  2. Hyperlipidemia, unspecified hyperlipidemia type  E78.5   3. Stage 3b chronic kidney disease  N18.32   4. Aortic atherosclerosis (HCC) Chronic I70.0    Return precautions advised.  Garret Reddish, MD

## 2019-10-17 ENCOUNTER — Other Ambulatory Visit (INDEPENDENT_AMBULATORY_CARE_PROVIDER_SITE_OTHER): Payer: Medicare HMO

## 2019-10-17 DIAGNOSIS — I10 Essential (primary) hypertension: Secondary | ICD-10-CM

## 2019-10-17 LAB — COMPREHENSIVE METABOLIC PANEL
ALT: 17 U/L (ref 0–35)
AST: 19 U/L (ref 0–37)
Albumin: 4 g/dL (ref 3.5–5.2)
Alkaline Phosphatase: 60 U/L (ref 39–117)
BUN: 15 mg/dL (ref 6–23)
CO2: 28 mEq/L (ref 19–32)
Calcium: 8.9 mg/dL (ref 8.4–10.5)
Chloride: 107 mEq/L (ref 96–112)
Creatinine, Ser: 1.23 mg/dL — ABNORMAL HIGH (ref 0.40–1.20)
GFR: 42.09 mL/min — ABNORMAL LOW (ref >60.00)
Glucose, Bld: 100 mg/dL — ABNORMAL HIGH (ref 70–99)
Potassium: 4.4 mEq/L (ref 3.5–5.1)
Sodium: 139 mEq/L (ref 135–145)
Total Bilirubin: 0.7 mg/dL (ref 0.2–1.2)
Total Protein: 6.1 g/dL (ref 6.0–8.3)

## 2019-10-17 LAB — CBC WITH DIFFERENTIAL/PLATELET
Basophils Absolute: 0.1 10*3/uL (ref 0.0–0.1)
Basophils Relative: 1.2 % (ref 0.0–3.0)
Eosinophils Absolute: 0.4 10*3/uL (ref 0.0–0.7)
Eosinophils Relative: 5.3 % — ABNORMAL HIGH (ref 0.0–5.0)
HCT: 38.8 % (ref 36.0–46.0)
Hemoglobin: 13.2 g/dL (ref 12.0–15.0)
Lymphocytes Relative: 19.8 % (ref 12.0–46.0)
Lymphs Abs: 1.4 10*3/uL (ref 0.7–4.0)
MCHC: 34.1 g/dL (ref 30.0–36.0)
MCV: 92.4 fl (ref 78.0–100.0)
Monocytes Absolute: 0.7 10*3/uL (ref 0.1–1.0)
Monocytes Relative: 9.7 % (ref 3.0–12.0)
Neutro Abs: 4.7 10*3/uL (ref 1.4–7.7)
Neutrophils Relative %: 64 % (ref 43.0–77.0)
Platelets: 290 10*3/uL (ref 150.0–400.0)
RBC: 4.2 Mil/uL (ref 3.87–5.11)
RDW: 13.5 % (ref 11.5–15.5)
WBC: 7.3 10*3/uL (ref 4.0–10.5)

## 2019-10-17 LAB — LDL CHOLESTEROL, DIRECT: Direct LDL: 59 mg/dL

## 2019-11-23 DIAGNOSIS — R928 Other abnormal and inconclusive findings on diagnostic imaging of breast: Secondary | ICD-10-CM | POA: Diagnosis not present

## 2019-11-23 DIAGNOSIS — N6001 Solitary cyst of right breast: Secondary | ICD-10-CM | POA: Diagnosis not present

## 2019-11-23 DIAGNOSIS — Z853 Personal history of malignant neoplasm of breast: Secondary | ICD-10-CM | POA: Diagnosis not present

## 2019-11-23 LAB — HM MAMMOGRAPHY

## 2019-11-24 ENCOUNTER — Encounter: Payer: Self-pay | Admitting: Family Medicine

## 2019-12-07 ENCOUNTER — Telehealth: Payer: Self-pay | Admitting: Hematology and Oncology

## 2019-12-07 ENCOUNTER — Telehealth: Payer: Self-pay | Admitting: *Deleted

## 2019-12-07 NOTE — Telephone Encounter (Signed)
Scheduled apt per 7/7 sch message - called pt - no answer and vmail full. Mailed reminder letter with appt date and time

## 2019-12-07 NOTE — Telephone Encounter (Signed)
Received call from pt requesting to cancel upcoming apt.  Pt states she will no longer follow up with Dr. Lindi Adie and will continue her yearly follow up appointments with Dr. Donne Hazel.  Apt canceled and pt educated to call our office in the future to make an apt if she changes her mind.  Pt verbalized understanding.

## 2019-12-08 ENCOUNTER — Other Ambulatory Visit: Payer: Self-pay | Admitting: Family Medicine

## 2019-12-08 DIAGNOSIS — E785 Hyperlipidemia, unspecified: Secondary | ICD-10-CM

## 2019-12-10 ENCOUNTER — Other Ambulatory Visit: Payer: Self-pay | Admitting: Family Medicine

## 2019-12-10 DIAGNOSIS — E785 Hyperlipidemia, unspecified: Secondary | ICD-10-CM

## 2019-12-13 ENCOUNTER — Other Ambulatory Visit: Payer: Self-pay | Admitting: Family Medicine

## 2019-12-13 DIAGNOSIS — E785 Hyperlipidemia, unspecified: Secondary | ICD-10-CM

## 2019-12-18 ENCOUNTER — Telehealth (INDEPENDENT_AMBULATORY_CARE_PROVIDER_SITE_OTHER): Payer: Medicare HMO | Admitting: Physician Assistant

## 2019-12-18 ENCOUNTER — Other Ambulatory Visit: Payer: Self-pay

## 2019-12-18 ENCOUNTER — Encounter: Payer: Self-pay | Admitting: Physician Assistant

## 2019-12-18 DIAGNOSIS — R69 Illness, unspecified: Secondary | ICD-10-CM | POA: Diagnosis not present

## 2019-12-18 DIAGNOSIS — F4321 Adjustment disorder with depressed mood: Secondary | ICD-10-CM

## 2019-12-18 MED ORDER — LORAZEPAM 0.5 MG PO TABS
0.5000 mg | ORAL_TABLET | Freq: Two times a day (BID) | ORAL | 0 refills | Status: DC | PRN
Start: 2019-12-18 — End: 2020-08-26

## 2019-12-18 NOTE — Progress Notes (Signed)
TELEPHONE ENCOUNTER   Patient verbally agreed to telephone visit and is aware that copayment and coinsurance may apply. Patient was treated using telemedicine according to accepted telemedicine protocols.  Location of the patient: home Location of provider: [place] of all persons participating in the telemedicine service and role in the encounter: Inda Coke, PA , Max and Glendora Score  Subjective:   Chief Complaint  Patient presents with   Acute anxiety     HPI   Anxiety Patient reports that she and her husband found her son deceased on 09-16-2022 afternoon. Her son was an alcoholic and apparently hit his head and had a traumatic brain bleed. She states that overall her symptoms are okay, but she had to identify his body yesterday and have a difficult time doing this.  She denies any prior history of significant anxiety or depression.  Denies current suicidal or homicidal ideation.  She states that her pastor is helping her cope.    Patient Active Problem List   Diagnosis Date Noted   Vasomotor rhinitis 05/10/2019   Chronic allergic conjunctivitis 05/10/2019   Frequent PVCs 04/14/2017   Aortic atherosclerosis ([place]) 11/23/2016   Low vitamin B12 level 06/24/2016   History of breast cancer- Breast cancer of lower-inner quadrant of left female breast 11/01/2015   CKD (chronic kidney disease), stage III 10/22/2015   Family history of abdominal aortic aneurysm 03/08/2015   Asthma, moderate persistent, well-controlled 04/19/2014   Hypertension 11/25/2010   Eosinophilia 02/08/2009   Allergic rhinitis 01/31/2008   Hyperlipidemia 12/20/2006   Osteopenia 12/20/2006   Social History   Tobacco Use   Smoking status: Never Smoker   Smokeless tobacco: Never Used  Substance Use Topics   Alcohol use: No    Alcohol/week: 0.0 standard drinks    Current Outpatient Medications:    Albuterol Sulfate (PROAIR RESPICLICK) 867 (90 Base) MCG/ACT  AEPB, Inhale 1 puff into the lungs every 8 (eight) hours as needed (wheezing/shortness of breath)., Disp: , Rfl:    atorvastatin (LIPITOR) 20 MG tablet, TAKE ONE TABLET BY MOUTH DAILY, Disp: 90 tablet, Rfl: 2   azelastine (OPTIVAR) 0.05 % ophthalmic solution, , Disp: , Rfl:    BREO ELLIPTA 200-25 MCG/INH AEPB, Inhale 1 puff into the lungs daily., Disp: , Rfl:    Calcium 500-125 MG-UNIT TABS, Take 1 tablet by mouth daily., Disp: , Rfl:    cetirizine (ZYRTEC) 10 MG tablet, Take 10 mg by mouth daily., Disp: , Rfl:    cholecalciferol (VITAMIN D) 1000 UNITS tablet, Take 2,000 Units by mouth daily., Disp: , Rfl:    fluticasone (FLONASE) 50 MCG/ACT nasal spray, Place 1 spray into both nostrils daily as needed. , Disp: , Rfl:    fluticasone furoate-vilanterol (BREO ELLIPTA) 100-25 MCG/INH AEPB, Inhale 1 puff into the lungs daily., Disp: , Rfl:    irbesartan (AVAPRO) 150 MG tablet, TAKE ONE TABLET BY MOUTH DAILY, Disp: 90 tablet, Rfl: 9   LORazepam (ATIVAN) 0.5 MG tablet, Take 1 tablet (0.5 mg total) by mouth 2 (two) times daily as needed for anxiety., Disp: 20 tablet, Rfl: 0   metoprolol succinate (TOPROL-XL) 50 MG 24 hr tablet, Take 1 tablet (50 mg total) by mouth daily. Take with or immediately following a meal., Disp: 90 tablet, Rfl: 3   montelukast (SINGULAIR) 10 MG tablet, Take 1 tablet by mouth at bedtime., Disp: , Rfl:  Allergies  Allergen Reactions   Lisinopril Other (See Comments) and Cough    Dry cough and  stinging rash   Latex Rash   Penicillins Rash    Has patient had a PCN reaction causing immediate rash, facial/tongue/throat swelling, SOB or lightheadedness with hypotension: Yes Has patient had a PCN reaction causing severe rash involving mucus membranes or skin necrosis: No Has patient had a PCN reaction that required hospitalization No Has patient had a PCN reaction occurring within the last 10 years: No If all of the above answers are "NO", then may proceed with  Cephalosporin use.    Sulfonamide Derivatives Rash   Tape Rash    Band-aids break out skin!!    Assessment & Plan:   1. Grieving   Patient declined any referral for therapy.  I did prescribe Ativan to be used as needed for this.  Reviewed precautions of this medication including drowsiness, increased fall risk.  Recommend that patient start with half a tablet and to only take as directed.  I also recommended follow-up with PCP if she feels like she needs continued prescription of this or has any other concerns.   No orders of the defined types were placed in this encounter.  Meds ordered this encounter  Medications   LORazepam (ATIVAN) 0.5 MG tablet    Sig: Take 1 tablet (0.5 mg total) by mouth 2 (two) times daily as needed for anxiety.    Dispense:  20 tablet    Refill:  0    Order Specific Question:   Supervising Provider    Answer:   Maryruth Eve    Inda Coke, PA 12/18/2019  Time spent with the patient: 8 minutes, spent in obtaining information about her symptoms, reviewing her previous labs, evaluations, and treatments, counseling her about her condition (please see the discussed topics above), and developing a plan to further investigate it; she had a number of questions which I addressed.

## 2020-01-26 DIAGNOSIS — H26491 Other secondary cataract, right eye: Secondary | ICD-10-CM | POA: Diagnosis not present

## 2020-01-26 DIAGNOSIS — H40053 Ocular hypertension, bilateral: Secondary | ICD-10-CM | POA: Diagnosis not present

## 2020-01-26 DIAGNOSIS — H52203 Unspecified astigmatism, bilateral: Secondary | ICD-10-CM | POA: Diagnosis not present

## 2020-01-31 ENCOUNTER — Ambulatory Visit: Payer: Medicare HMO | Admitting: Dermatology

## 2020-02-07 ENCOUNTER — Ambulatory Visit: Payer: Medicare HMO | Admitting: Hematology and Oncology

## 2020-03-07 ENCOUNTER — Ambulatory Visit: Payer: Medicare HMO | Attending: Internal Medicine

## 2020-03-07 DIAGNOSIS — Z23 Encounter for immunization: Secondary | ICD-10-CM

## 2020-03-07 NOTE — Progress Notes (Signed)
   Covid-19 Vaccination Clinic  Name:  Lisa Greene    MRN: [id number] DOB: 1941-05-27  03/07/2020  Lisa Greene was observed post Covid-19 immunization for 15 minutes without incident. She was provided with Vaccine Information Sheet and instruction to access the V-Safe system.   Lisa Greene was instructed to call 911 with any severe reactions post vaccine: Marland Kitchen Difficulty breathing  . Swelling of face and throat  . A fast heartbeat  . A bad rash all over body  . Dizziness and weakness

## 2020-03-15 DIAGNOSIS — D0512 Intraductal carcinoma in situ of left breast: Secondary | ICD-10-CM | POA: Diagnosis not present

## 2020-03-25 NOTE — Progress Notes (Signed)
Phone [phone] Virtual visit via Video note   Subjective:  Chief complaint: Chief Complaint  Patient presents with  . Fatigue   This visit type was conducted due to national recommendations for restrictions regarding the COVID-19 Pandemic (e.g. social distancing).  This format is felt to be most appropriate for this patient at this time balancing risks to patient and risks to population by having him in for in person visit.  No physical exam was performed (except for noted visual exam or audio findings with Telehealth visits).    Our team/I connected with Lisa Greene at 11:20 AM EDT by a video enabled telemedicine application (doxy.me or caregility through epic) and verified that I am speaking with the correct person using two identifiers.  Location patient: [place] Location provider: [facility], office Persons participating in the virtual visit:  patient, husband  Our team/I discussed the limitations of evaluation and management by telemedicine and the availability of in person appointments. In light of current covid-19 pandemic, patient also understands that we are trying to protect them by minimizing in office contact if at all possible.  The patient expressed consent for telemedicine visit and agreed to proceed. Patient understands insurance will be billed.   Past Medical History-  Patient Active Problem List   Diagnosis Date Noted  . Frequent PVCs 04/14/2017    Priority: High  . History of breast cancer- Breast cancer of lower-inner quadrant of left female breast 11/01/2015    Priority: High  . Low vitamin B12 level 06/24/2016    Priority: Medium  . CKD (chronic kidney disease), stage III ([place]) 10/22/2015    Priority: Medium  . Asthma, moderate persistent, well-controlled 04/19/2014    Priority: Medium  . Hypertension 11/25/2010    Priority: Medium  . Hyperlipidemia 12/20/2006    Priority: Medium  . Osteopenia 12/20/2006    Priority: Medium  . Vasomotor rhinitis  05/10/2019    Priority: Low  . Chronic allergic conjunctivitis 05/10/2019    Priority: Low  . Aortic atherosclerosis ([place]) 11/23/2016    Priority: Low  . Family history of abdominal aortic aneurysm 03/08/2015    Priority: Low  . Eosinophilia 02/08/2009    Priority: Low  . Allergic rhinitis 01/31/2008    Priority: Low    Medications- reviewed and updated Current Outpatient Medications  Medication Sig Dispense Refill  . Albuterol Sulfate (PROAIR RESPICLICK) 742 (90 Base) MCG/ACT AEPB Inhale 1 puff into the lungs every 8 (eight) hours as needed (wheezing/shortness of breath).    Marland Kitchen atorvastatin (LIPITOR) 20 MG tablet TAKE ONE TABLET BY MOUTH DAILY 90 tablet 2  . azelastine (OPTIVAR) 0.05 % ophthalmic solution     . BREO ELLIPTA 200-25 MCG/INH AEPB Inhale 1 puff into the lungs daily.    . Calcium 500-125 MG-UNIT TABS Take 1 tablet by mouth daily.    . cetirizine (ZYRTEC) 10 MG tablet Take 10 mg by mouth daily.    . cholecalciferol (VITAMIN D) 1000 UNITS tablet Take 2,000 Units by mouth daily.    . fluticasone (FLONASE) 50 MCG/ACT nasal spray Place 1 spray into both nostrils daily as needed.     . fluticasone furoate-vilanterol (BREO ELLIPTA) 100-25 MCG/INH AEPB Inhale 1 puff into the lungs daily.    . irbesartan (AVAPRO) 150 MG tablet TAKE ONE TABLET BY MOUTH DAILY 90 tablet 9  . LORazepam (ATIVAN) 0.5 MG tablet Take 1 tablet (0.5 mg total) by mouth 2 (two) times daily as needed for anxiety. 20 tablet 0  . metoprolol  succinate (TOPROL-XL) 50 MG 24 hr tablet Take 1 tablet (50 mg total) by mouth daily. Take with or immediately following a meal. 90 tablet 3  . montelukast (SINGULAIR) 10 MG tablet Take 1 tablet by mouth at bedtime.     No current facility-administered medications for this visit.     Objective:  BP (!) 144/80   Pulse 80   Temp 98.3 F (36.8 C)  self reported vitals Gen: NAD, resting comfortably Lungs: nonlabored, normal respiratory rate  Skin: appears dry, no obvious  rash     Assessment and Plan   Fatigue S: Patient stated that she started feeling fatigue a week ago (actually felt somewhat more run down since covid booster 03/07/20 but worse over the last week). She stated that one moment she feels fine and the next moment she has zero energy to do anything.  Feels run down like has a really bad cold but has not cold symptoms outside of the fatigue. Tylenol seems to help.   Had some nausea a few days after vaccination but none since then.   Some left ear sensitivity. Picking up up pecans yesterday and both of her jaws started causing some  chest tightness and then sat down and rest improved but she is allergic to pecan trees. Has not had  Any chest pain or shortness of breath outside of that one episode. No wheezing with her asthma. No palpitations increaed with history of PVCs. Staying well hydrated  A/P: 79 year old female with fatigue starting after COVID-19 booster with Pfizer March 07, 2020 but worsening over the last week mirroring her husband's symptoms was also vaccinated on the same day.  Unclear etiology-vaccine reaction possible.  No obvious stroke symptoms reported.  No leg swelling or calf pain to suggest DVT/PE.  Asthma appears well controlled.  Fatigue can be a Covid symptom so we will test as below and gave advice on possible COVID-19.  If this Covid test is negative we will need to have her back by for labs which were ordered today -Did have episode where felt some tightness in her jaw and chest which resolved with rest but this was with exposure to pecans and she reports allergy to pecans-has not had this outside of this episode-if she has recurrent symptoms should seek care immediately.  She does not have known coronary artery disease but follows with cardiology for PVCs  Patient with symptoms concerning for potential covid 19 Therefore: - testing options discussed with patient 1. Potential daytime testing at our office nded patient watch  closely for shortness of breath or confusion or worsening symptoms and if those occur he should contact us immediately  -recommended patient consider purchasing pulse oximeter and if levels 94% or below persistently- seek care at the hospital  -recommended self isolation until negative test  at minimum .  -Hopeful results back within 48 hours of test but may take up to a week    #hypertension S: medication: Hydrochlorothiazide 25 mg, metoprolol 50 mg extended release, irbesartan 150 mg Home readings #s: Her home cuff in the past has read about 10 points higher than our readings.  Systolic blood pressure was 118 when seen at Dr. Cristal Generous office recently BP Readings from Last 3 Encounters:  03/26/20 (!) 144/80  10/16/19 (!) 142/80  05/11/19 138/78  A/P: Blood pressure mild poorly controlled today but was well controlled when recently seen at her surgeon's office-discussed doing some home monitoring though her home cuff in the past has run slightly higher  than our readings-if not trending back down we may need to see her back in the office for recheck.  Continue current medications for now   # B12 deficiency S: Current treatment/medication (oral vs. IM): Remains on oral B12 Lab Results  Component Value Date   [id number] 188 (H) 05/11/2019  A/P: I doubt B12 deficiency as the cause of her symptoms but we will update B12 level with labs.  Hopefully stable-continue current medications for now  Recommended follow up: As needed for acute concerns-otherwise follow-up in December as planned Future Appointments  Date Time Provider [place]  05/15/2020  9:20 AM Marin Olp, MD [facility]  06/14/2020  8:40 AM Belva Crome, MD [facility]    Lab/Order associations:   ICD-10-CM   1. Fatigue, unspecified type  R53.83 CBC With Differential/Platelet    COMPLETE METABOLIC PANEL WITH GFR    TSH  2. Low vitamin B12 level  E53.8 Vitamin B12  3. Primary hypertension  I10  CBC With Differential/Platelet    COMPLETE METABOLIC PANEL WITH GFR    TSH  4. Encounter for screening for COVID-19  Z11.52 Novel Coronavirus, NAA ([facility])   Return precautions advised.  Garret Reddish, MD

## 2020-03-25 NOTE — Patient Instructions (Signed)
Health Maintenance Due  Topic Date Due  . Hepatitis C Screening  Never done  . INFLUENZA VACCINE  12/31/2019   Depression screen [facility] 2/9 05/11/2019 03/16/2019 11/08/2017  Decreased Interest 0 0 0  Down, Depressed, Hopeless 0 0 0  PHQ - 2 Score 0 0 0  Altered sleeping 0 - -  Tired, decreased energy 0 - -  Change in appetite 0 - -  Feeling bad or failure about yourself  0 - -  Trouble concentrating 0 - -  Moving slowly or fidgety/restless 0 - -  Suicidal thoughts 0 - -  PHQ-9 Score 0 - -  Difficult doing work/chores Not difficult at all - -  Some recent data might be hidden

## 2020-03-26 ENCOUNTER — Other Ambulatory Visit: Payer: Self-pay

## 2020-03-26 ENCOUNTER — Ambulatory Visit: Payer: Medicare HMO

## 2020-03-26 ENCOUNTER — Telehealth (INDEPENDENT_AMBULATORY_CARE_PROVIDER_SITE_OTHER): Payer: Medicare HMO | Admitting: Family Medicine

## 2020-03-26 ENCOUNTER — Telehealth: Payer: Medicare HMO | Admitting: Family Medicine

## 2020-03-26 ENCOUNTER — Encounter: Payer: Self-pay | Admitting: Family Medicine

## 2020-03-26 VITALS — BP 144/80 | HR 80 | Temp 98.3°F

## 2020-03-26 DIAGNOSIS — I10 Essential (primary) hypertension: Secondary | ICD-10-CM

## 2020-03-26 DIAGNOSIS — E538 Deficiency of other specified B group vitamins: Secondary | ICD-10-CM

## 2020-03-26 DIAGNOSIS — Z1152 Encounter for screening for COVID-19: Secondary | ICD-10-CM | POA: Diagnosis not present

## 2020-03-26 DIAGNOSIS — R5383 Other fatigue: Secondary | ICD-10-CM

## 2020-03-27 LAB — SARS-COV-2, NAA 2 DAY TAT

## 2020-03-27 LAB — NOVEL CORONAVIRUS, NAA: SARS-CoV-2, NAA: NOT DETECTED

## 2020-03-28 ENCOUNTER — Other Ambulatory Visit: Payer: Medicare HMO

## 2020-03-28 ENCOUNTER — Other Ambulatory Visit: Payer: Self-pay

## 2020-03-28 ENCOUNTER — Telehealth: Payer: Self-pay

## 2020-03-28 DIAGNOSIS — I10 Essential (primary) hypertension: Secondary | ICD-10-CM

## 2020-03-28 DIAGNOSIS — R5383 Other fatigue: Secondary | ICD-10-CM

## 2020-03-28 DIAGNOSIS — E538 Deficiency of other specified B group vitamins: Secondary | ICD-10-CM | POA: Diagnosis not present

## 2020-03-28 NOTE — Telephone Encounter (Signed)
Spoke with the patient and she has decided to hold off on having her cholesterol checked.

## 2020-03-28 NOTE — Telephone Encounter (Signed)
Pt and husband are coming in within the hour for labs and asking if they can also get their cholesterol checked.

## 2020-03-28 NOTE — Telephone Encounter (Signed)
May order under hyperlipidemia-make sure patient is aware since it has not been a full 365 days insurance occasionally will not cover this

## 2020-03-29 LAB — COMPLETE METABOLIC PANEL WITH GFR
AG Ratio: 1.8 (calc) (ref 1.0–2.5)
ALT: 17 U/L (ref 6–29)
AST: 19 U/L (ref 10–35)
Albumin: 3.9 g/dL (ref 3.6–5.1)
Alkaline phosphatase (APISO): 62 U/L (ref 37–153)
BUN/Creatinine Ratio: 12 (calc) (ref 6–22)
BUN: 15 mg/dL (ref 7–25)
CO2: 26 mmol/L (ref 20–32)
Calcium: 9.1 mg/dL (ref 8.6–10.4)
Chloride: 104 mmol/L (ref 98–110)
Creat: 1.25 mg/dL — ABNORMAL HIGH (ref 0.60–0.93)
GFR, Est African American: 47 mL/min/{1.73_m2} — ABNORMAL LOW (ref >=60)
GFR, Est Non African American: 41 mL/min/{1.73_m2} — ABNORMAL LOW (ref >=60)
Globulin: 2.2 g/dL (calc) (ref 1.9–3.7)
Glucose, Bld: 95 mg/dL (ref 65–99)
Potassium: 4.3 mmol/L (ref 3.5–5.3)
Sodium: 137 mmol/L (ref 135–146)
Total Bilirubin: 0.9 mg/dL (ref 0.2–1.2)
Total Protein: 6.1 g/dL (ref 6.1–8.1)

## 2020-03-29 LAB — CBC WITH DIFFERENTIAL/PLATELET
Absolute Monocytes: 578 cells/uL (ref 200–950)
Basophils Absolute: 82 cells/uL (ref 0–200)
Basophils Relative: 1.2 %
Eosinophils Absolute: 306 cells/uL (ref 15–500)
Eosinophils Relative: 4.5 %
HCT: 38.8 % (ref 35.0–45.0)
Hemoglobin: 13.1 g/dL (ref 11.7–15.5)
Lymphs Abs: 1618 cells/uL (ref 850–3900)
MCH: 31 pg (ref 27.0–33.0)
MCHC: 33.8 g/dL (ref 32.0–36.0)
MCV: 91.9 fL (ref 80.0–100.0)
MPV: 9.2 fL (ref 7.5–12.5)
Monocytes Relative: 8.5 %
Neutro Abs: 4216 cells/uL (ref 1500–7800)
Neutrophils Relative %: 62 %
Platelets: 286 10*3/uL (ref 140–400)
RBC: 4.22 10*6/uL (ref 3.80–5.10)
RDW: 12.6 % (ref 11.0–15.0)
Total Lymphocyte: 23.8 %
WBC: 6.8 10*3/uL (ref 3.8–10.8)

## 2020-03-29 LAB — VITAMIN B12: Vitamin B-12: 1531 pg/mL — ABNORMAL HIGH (ref 200–1100)

## 2020-03-29 LAB — TSH: TSH: 3.91 mIU/L (ref 0.40–4.50)

## 2020-04-02 ENCOUNTER — Other Ambulatory Visit: Payer: Self-pay

## 2020-04-02 ENCOUNTER — Encounter: Payer: Self-pay | Admitting: Family Medicine

## 2020-04-02 ENCOUNTER — Ambulatory Visit (INDEPENDENT_AMBULATORY_CARE_PROVIDER_SITE_OTHER): Payer: Medicare HMO | Admitting: Family Medicine

## 2020-04-02 VITALS — BP 120/80 | HR 62 | Temp 97.7°F | Resp 18 | Ht 60.0 in | Wt 150.0 lb

## 2020-04-02 DIAGNOSIS — Z1159 Encounter for screening for other viral diseases: Secondary | ICD-10-CM

## 2020-04-02 DIAGNOSIS — E785 Hyperlipidemia, unspecified: Secondary | ICD-10-CM

## 2020-04-02 DIAGNOSIS — R5383 Other fatigue: Secondary | ICD-10-CM | POA: Diagnosis not present

## 2020-04-02 NOTE — Progress Notes (Signed)
Phone [phone] In person visit   Subjective:   Lisa Greene is a 79 y.o. year old very pleasant female patient who presents for/with See problem oriented charting Chief Complaint  Patient presents with  . Fatigue   This visit occurred during the SARS-CoV-2 public health emergency.  Safety protocols were in place, including screening questions prior to the visit, additional usage of staff PPE, and extensive cleaning of exam room while observing appropriate contact time as indicated for disinfecting solutions.   Past Medical History-  Patient Active Problem List   Diagnosis Date Noted  . Frequent PVCs 04/14/2017    Priority: High  . History of breast cancer- Breast cancer of lower-inner quadrant of left female breast 11/01/2015    Priority: High  . Low vitamin B12 level 06/24/2016    Priority: Medium  . CKD (chronic kidney disease), stage III ([place]) 10/22/2015    Priority: Medium  . Asthma, moderate persistent, well-controlled 04/19/2014    Priority: Medium  . Hypertension 11/25/2010    Priority: Medium  . Hyperlipidemia 12/20/2006    Priority: Medium  . Osteopenia 12/20/2006    Priority: Medium  . Vasomotor rhinitis 05/10/2019    Priority: Low  . Chronic allergic conjunctivitis 05/10/2019    Priority: Low  . Aortic atherosclerosis ([place]) 11/23/2016    Priority: Low  . Family history of abdominal aortic aneurysm 03/08/2015    Priority: Low  . Eosinophilia 02/08/2009    Priority: Low  . Allergic rhinitis 01/31/2008    Priority: Low    Medications- reviewed and updated Current Outpatient Medications  Medication Sig Dispense Refill  . Albuterol Sulfate (PROAIR RESPICLICK) 332 (90 Base) MCG/ACT AEPB Inhale 1 puff into the lungs every 8 (eight) hours as needed (wheezing/shortness of breath).    Marland Kitchen atorvastatin (LIPITOR) 20 MG tablet TAKE ONE TABLET BY MOUTH DAILY 90 tablet 2  . azelastine (OPTIVAR) 0.05 % ophthalmic solution     . BREO ELLIPTA 200-25 MCG/INH AEPB  Inhale 1 puff into the lungs daily.    . Calcium 500-125 MG-UNIT TABS Take 1 tablet by mouth daily.    . cetirizine (ZYRTEC) 10 MG tablet Take 10 mg by mouth daily.    . cholecalciferol (VITAMIN D) 1000 UNITS tablet Take 2,000 Units by mouth daily.    . fluticasone (FLONASE) 50 MCG/ACT nasal spray Place 1 spray into both nostrils daily as needed.     . fluticasone furoate-vilanterol (BREO ELLIPTA) 100-25 MCG/INH AEPB Inhale 1 puff into the lungs daily.    . irbesartan (AVAPRO) 150 MG tablet TAKE ONE TABLET BY MOUTH DAILY 90 tablet 9  . LORazepam (ATIVAN) 0.5 MG tablet Take 1 tablet (0.5 mg total) by mouth 2 (two) times daily as needed for anxiety. 20 tablet 0  . metoprolol succinate (TOPROL-XL) 50 MG 24 hr tablet Take 1 tablet (50 mg total) by mouth daily. Take with or immediately following a meal. 90 tablet 3  . montelukast (SINGULAIR) 10 MG tablet Take 1 tablet by mouth at bedtime.     No current facility-administered medications for this visit.     Objective:  BP 120/80   Pulse 62   Temp 97.7 F (36.5 C) (Temporal)   Resp 18   Ht 5' (1.524 m)   Wt 150 lb (68 kg)   SpO2 98%   BMI 29.29 kg/m  Gen: NAD, resting comfortably TM normal bilaterally. Ear canal normal on left. No click with jaw opening.  CV: RRR no murmurs rubs or gallops Lungs:  CTAB no crackles, wheeze, rhonchi Abdomen: soft/nontender/nondistended/normal bowel sounds.  Ext: no edema Skin: warm, dry   EKG: sinus bradycardia with rate 56, normal axis, normal intervals, no hypertrophy, no st or t wave changes     Assessment and Plan  # Fatigue S:ongoing fatigue since after covid 19 booster October 7th 2021.  bloodwork reassuring including cmp, b12, bcbc, tsh. She tested negative for covid 19. EKG today reassuring as above  She reports ongoing fatigue that is slowly improving. Husband with very similar and very similar timecourse- he is also improving.   She did have one episode where she has exposures to pecans and  has an allergy- noted some jaw pain/chest tightness but no recurrence of that with avoidance.   Left ear sensitivity has continued. At moment perhaps 2-3/10 down from 7/10 and improving. No sinus congestion/sneezing/cough. No shortness of breath with this. Has had TMJD in past but doesn't feel like that.  A/P: 79 year old female with ongoing fatigue after COVID-19 booster approximately a month ago-seems to be slowly improving-at this point I think vaccine side effect is most likely cause of symptoms.  Husband was very similar trajectory.  In regards to her left ear pain-exam is reassuring today and seems to be improving-if she has new or worsening symptoms should let me know.  Her EKG is largely reassuring-we particular wanted to do this with her history of chest pain but likely that has not recurred (seemed to be linked to exposure to pecans)   Recommended follow up:  Keep next month follow up  Future Appointments  Date Time Provider [place]  05/15/2020  9:20 AM Marin Olp, MD [facility]  06/14/2020  8:40 AM Belva Crome, MD [facility]    Lab/Order associations:   ICD-10-CM   1. Fatigue, unspecified type  R53.83 EKG 12-Lead  2. Hyperlipidemia, unspecified hyperlipidemia type  E78.5 Lipid Panel w/reflex Direct LDL  3. Encounter for hepatitis C screening test for low risk patient  Z11.59 Hepatitis C antibody   Time Spent: 13 minutes of total time (10:45 AM- 10:59 AM, with 2 minutes for EKg interpretation) was spent on the date of the encounter performing the following actions: chart review prior to seeing the patient, obtaining history, performing a medically necessary exam, counseling on the treatment plan, placing orders, and documenting in our EHR.   Return precautions advised.  Garret Reddish, MD

## 2020-04-02 NOTE — Patient Instructions (Addendum)
Health Maintenance Due  Topic Date Due  . Hepatitis C Screening - with lab snext month Never done  . INFLUENZA VACCINE Declined in office flu shot today. Lets wait until you feel pretty close to normal as long as that's before the end of the year 12/31/2019   Schedule a lab visit at the check out desk a 2 days before your next visit Return for future fasting labs meaning nothing but water after midnight please. Ok to take your medications with water.   If you have new or worsening symptoms please let us know prior to visit  Recommended follow up: keep December 15th visit

## 2020-04-03 LAB — LIPID PANEL W/REFLEX DIRECT LDL
Cholesterol: 151 mg/dL (ref <200)
HDL: 74 mg/dL (ref >=50)
LDL Cholesterol (Calc): 58 mg/dL (calc)
Non-HDL Cholesterol (Calc): 77 mg/dL (calc) (ref <130)
Total CHOL/HDL Ratio: 2 (calc) (ref <5.0)
Triglycerides: 103 mg/dL (ref <150)

## 2020-04-03 LAB — HEPATITIS C ANTIBODY
Hepatitis C Ab: NONREACTIVE
SIGNAL TO CUT-OFF: 0.01 (ref <1.00)

## 2020-04-04 ENCOUNTER — Telehealth: Payer: Self-pay | Admitting: Interventional Cardiology

## 2020-04-04 DIAGNOSIS — R002 Palpitations: Secondary | ICD-10-CM

## 2020-04-04 DIAGNOSIS — I493 Ventricular premature depolarization: Secondary | ICD-10-CM

## 2020-04-04 NOTE — Telephone Encounter (Signed)
STAT if HR is under 50 or over 120 (normal HR is 60-100 beats per minute)  1) What is your heart rate? 88  2) Do you have a log of your heart rate readings (document readings)? No  3) Do you have any other symptoms? No

## 2020-04-04 NOTE — Telephone Encounter (Signed)
Pt states she woke up in the middle of the night and felt her heart racing.  Episode lasted about 10-15 mins.  Did not check vitals when this was occurring.  Denies caffeine use.  Denies CP. SOB or dizziness during this episode.  States she got her covid booster about a month ago and just hasn't felt good since.  Covid test negative.  Pt previously wore a monitor in 2018 that showed frequent PVCs.  Currently on Metoprolol Succinate 50mg  QD.  Mentioned possibly wearing a monitor again.  Pt agreeable if Dr. Tamala Julian feels it is warranted.  Advised I will send message to Dr. Tamala Julian for review.

## 2020-04-05 NOTE — Telephone Encounter (Signed)
Left message to call back  

## 2020-04-05 NOTE — Telephone Encounter (Signed)
Patient calling back in to check to see if there is any update on what her next steps would be. Advised Dr. Tamala Julian would review and we would give her a call when we have an update. She also states that her "blood pressure has come down to normal but she still feels like her heart is racing."

## 2020-04-05 NOTE — Telephone Encounter (Signed)
Please have her to wear a 30-day monitor

## 2020-04-08 NOTE — Telephone Encounter (Signed)
Spoke with pt and made her aware of order for monitor.  Reviewed instructions.  Pt verbalized understanding and was in agreement with this plan.

## 2020-04-15 ENCOUNTER — Ambulatory Visit (INDEPENDENT_AMBULATORY_CARE_PROVIDER_SITE_OTHER): Payer: Medicare HMO

## 2020-04-15 DIAGNOSIS — R002 Palpitations: Secondary | ICD-10-CM

## 2020-04-15 DIAGNOSIS — I493 Ventricular premature depolarization: Secondary | ICD-10-CM

## 2020-04-16 ENCOUNTER — Telehealth: Payer: Self-pay | Admitting: Family Medicine

## 2020-04-16 NOTE — Telephone Encounter (Signed)
Left message for patient to call back and schedule Medicare Annual Wellness Visit (AWV) either virtually OR in office.   Last AWV 03/15/20; please schedule at anytime with [facility] at [facility]  This should be a 45 minute visit.

## 2020-05-03 DIAGNOSIS — J300 Vasomotor rhinitis: Secondary | ICD-10-CM | POA: Diagnosis not present

## 2020-05-03 DIAGNOSIS — H1045 Other chronic allergic conjunctivitis: Secondary | ICD-10-CM | POA: Diagnosis not present

## 2020-05-03 DIAGNOSIS — J4540 Moderate persistent asthma, uncomplicated: Secondary | ICD-10-CM | POA: Diagnosis not present

## 2020-05-07 ENCOUNTER — Other Ambulatory Visit: Payer: Self-pay

## 2020-05-07 ENCOUNTER — Ambulatory Visit (INDEPENDENT_AMBULATORY_CARE_PROVIDER_SITE_OTHER): Payer: Medicare HMO

## 2020-05-07 DIAGNOSIS — Z23 Encounter for immunization: Secondary | ICD-10-CM | POA: Diagnosis not present

## 2020-05-07 DIAGNOSIS — E538 Deficiency of other specified B group vitamins: Secondary | ICD-10-CM

## 2020-05-07 DIAGNOSIS — E785 Hyperlipidemia, unspecified: Secondary | ICD-10-CM

## 2020-05-07 DIAGNOSIS — M85851 Other specified disorders of bone density and structure, right thigh: Secondary | ICD-10-CM

## 2020-05-07 DIAGNOSIS — R69 Illness, unspecified: Secondary | ICD-10-CM | POA: Diagnosis not present

## 2020-05-13 ENCOUNTER — Other Ambulatory Visit: Payer: Medicare HMO

## 2020-05-13 ENCOUNTER — Telehealth: Payer: Self-pay

## 2020-05-13 NOTE — Telephone Encounter (Signed)
Please tell her I am sorry for her loss-if you see any spots available I am okay with rescheduling or could place her on cancellation list.  This is a packed time of year particular with me having a week off around Christmas so not sure it can be within a month.

## 2020-05-13 NOTE — Telephone Encounter (Signed)
Pt came into office stating she had to reschedule due to a death in the family. Pt had a physical scheduled for Wednesday. Can we work pt in sometime within the next month? Please advise.

## 2020-05-13 NOTE — Telephone Encounter (Signed)
See below

## 2020-05-15 ENCOUNTER — Encounter: Payer: Medicare HMO | Admitting: Family Medicine

## 2020-05-15 NOTE — Telephone Encounter (Signed)
Called and LVM for pt to reschedule CPE

## 2020-06-07 ENCOUNTER — Other Ambulatory Visit: Payer: Self-pay | Admitting: Interventional Cardiology

## 2020-06-10 NOTE — Progress Notes (Signed)
Cardiology Office Note:    Date:  06/14/2020   ID:  Lisa Greene, DOB 23-Apr-1941, MRN [id number]  PCP:  Marin Olp, MD  Cardiologist:  Sinclair Grooms, MD   Referring MD: Marin Olp, MD   Chief Complaint  Patient presents with  . Coronary Artery Disease  . Palpitations    PVCs    History of Present Illness:    Lisa Greene is a 80 y.o. female with a hx of hyperlipidemia, aortic atherosclerosis,and relatively frequent asymptomatic PVCs.  (28% burden by 48-hour monitor 2018)  She feels well.  She was sluggish for 2 to 3 weeks after her M RNA booster.  Those symptoms have resolved.  She has occasional shortness of breath.  She does not feel palpitations.  She was having the feeling in November that her heart was racing.  A long-term monitor was done and is detailed below.  Particular complaint was after she received the booster.  Past Medical History:  Diagnosis Date  . ALLERGIC RHINITIS 01/31/2008  . Allergy   . Asthma   . BCC (basal cell carcinoma)sup& nod 03/01/2017   right nostril  . Breast cancer ([place])   . Breast cancer of lower-inner quadrant of left female breast ([place]) 11/01/2015  . Cataract   . Colon cancer screening 05/21/2014   No polyps at age 41. Diverticulosis alone-no more colonoscopies.    Marland Kitchen DIVERTICULITIS, HX OF 09/07/2008   pt denies this   . Eosinophilia 02/08/2009  . Fibroid   . Heart murmur    slight per pt.   Marland Kitchen HYPERLIPIDEMIA 12/20/2006  . Hypertension   . Osteopenia   . OSTEOPOROSIS 12/20/2006  . SCC (squamous cell carcinoma) well diff 11/28/2013   left side chin  . Vitamin B 12 deficiency     Past Surgical History:  Procedure Laterality Date  . BREAST LUMPECTOMY WITH NEEDLE LOCALIZATION Left 01/03/2016   Procedure: BREAST re-excision LUMPECTOMY WITH NEEDLE LOCALIZATION;  Surgeon: Rolm Bookbinder, MD;  Location: [place];  Service: General;  Laterality: Left;  BREAST re-excision LUMPECTOMY WITH NEEDLE  LOCALIZATION  . BREAST LUMPECTOMY WITH RADIOACTIVE SEED LOCALIZATION Left 11/28/2015   Procedure: LEFT BREAST LUMPECTOMY WITH BRACKETED RADIOACTIVE SEED LOCALIZATION;  Surgeon: Rolm Bookbinder, MD;  Location: [place];  Service: General;  Laterality: Left;  . BREAST SURGERY     cysts removed x2  . CATARACT EXTRACTION BILATERAL W/ ANTERIOR VITRECTOMY  2013   bilateral cataracts  . COLONOSCOPY  2005   tics only   . FOOT SURGERY    . KNEE ARTHROSCOPY     left  . NEUROMA SURGERY     x2 feet  . thyroid duct cyst      Current Medications: Current Meds  Medication Sig  . Albuterol Sulfate (PROAIR RESPICLICK) 287 (90 Base) MCG/ACT AEPB Inhale 1 puff into the lungs every 8 (eight) hours as needed (wheezing/shortness of breath).  Marland Kitchen atorvastatin (LIPITOR) 20 MG tablet TAKE ONE TABLET BY MOUTH DAILY  . azelastine (OPTIVAR) 0.05 % ophthalmic solution   . BREO ELLIPTA 200-25 MCG/INH AEPB Inhale 1 puff into the lungs daily.  . Calcium 500-125 MG-UNIT TABS Take 1 tablet by mouth daily.  . cetirizine (ZYRTEC) 10 MG tablet Take 10 mg by mouth daily.  . cholecalciferol (VITAMIN D) 1000 UNITS tablet Take 2,000 Units by mouth daily.  . fluticasone (FLONASE) 50 MCG/ACT nasal spray Place 1 spray into both nostrils daily as needed.   . fluticasone furoate-vilanterol (  BREO ELLIPTA) 100-25 MCG/INH AEPB Inhale 1 puff into the lungs daily.  . irbesartan (AVAPRO) 150 MG tablet TAKE ONE TABLET BY MOUTH DAILY  . LORazepam (ATIVAN) 0.5 MG tablet Take 1 tablet (0.5 mg total) by mouth 2 (two) times daily as needed for anxiety.  . metoprolol succinate (TOPROL-XL) 50 MG 24 hr tablet Take 1 tablet (50 mg total) by mouth daily. With or immediately following a meal. Please keep upcoming appt with Dr. Tamala Julian in January 2022. Thanks  . montelukast (SINGULAIR) 10 MG tablet Take 1 tablet by mouth at bedtime.     Allergies:   Lisinopril, Latex, Penicillins, Sulfonamide derivatives, and Tape   Social  History   Socioeconomic History  . Marital status: Married    Spouse name: Not on file  . Number of children: Not on file  . Years of education: Not on file  . Highest education level: Not on file  Occupational History  . Occupation: retired  Tobacco Use  . Smoking status: Never Smoker  . Smokeless tobacco: Never Used  Vaping Use  . Vaping Use: Never used  Substance and Sexual Activity  . Alcohol use: No    Alcohol/week: 0.0 standard drinks  . Drug use: No  . Sexual activity: Yes    Partners: Male    Birth control/protection: Post-menopausal  Other Topics Concern  . Not on file  Social History Narrative   Married. No kids- 2 step kids. 5 step grandkids.       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 There is no history of Colon cancer or Stomach cancer.  ROS:   Please see the history of present illness.    No specific complaints.  She is worried about her husband who seems to be slowing down.  All other systems reviewed and are negative.  EKGs/Labs/Other Studies Reviewed:    The following studies were reviewed today:  Cardiac monitor 04/15/2020: Study Highlights    Normal sinus rhythm and sinus bradycardia. HR range 48-114 bpm, ave 74 bpm  Frequent PVC's occasionally bigeminy and trigeminy.  PVC burden 15%  No atrial fibrillation.  C/O flutter and skipped beat is related to PVC's   Compared to prior monitor 11/2016, PVC burden has decreased. No atrial fibrillation or  ventricular tachycardia.  We should consider EP consult if still symptomatic.     EKG:  EKG not repeated  Recent Labs: 03/28/2020: ALT 17; BUN 15; Creat 1.25; Hemoglobin 13.1; Platelets 286; Potassium 4.3; Sodium 137; TSH 3.91  Recent Lipid Panel    Component Value Date/Time   CHOL 151 04/02/2020 1138   TRIG 103 04/02/2020 1138   HDL 74 04/02/2020 1138   CHOLHDL 2.0 04/02/2020 1138   VLDL 15.2 05/11/2019 0846   LDLCALC 58 04/02/2020 1138   LDLDIRECT 59.0 10/17/2019 0930    Physical Exam:    VS:  BP (!) 154/82   Pulse (!) 54   Ht 5' (1.524 m)   Wt 151 lb 3.2  oz (68.6 kg)   SpO2 99%   BMI 29.53 kg/m     Wt Readings from Last 3 Encounters:  06/14/20 151 lb 3.2 oz (68.6 kg)  04/02/20 150 lb (68 kg)  10/16/19 153 lb (69.4 kg)     GEN: Overweight. No acute distress HEENT: Normal NECK: No JVD. LYMPHATICS: No lymphadenopathy CARDIAC: No murmur.  Irregular rhythm from what seems to be premature contractions.  No gallop, or edema. VASCULAR:  Normal Pulses. No bruits. RESPIRATORY:  Clear to auscultation without rales, wheezing or rhonchi  ABDOMEN: Soft, non-tender, non-distended, No pulsatile mass, MUSCULOSKELETAL: No deformity  SKIN: Warm and dry NEUROLOGIC:  Alert and oriented x 3 PSYCHIATRIC:  Normal affect   ASSESSMENT:    1. PVC (premature ventricular contraction)   2. Palpitations   3. Aortic atherosclerosis ([place])   4. Hyperlipidemia, unspecified hyperlipidemia type   5. Essential hypertension   6. Stage 3b chronic kidney disease ([place])   7. Educated about COVID-19 virus infection    PLAN:    In order of problems listed above:  1. High burden of PVCs.  We will do an echocardiogram to exclude PVC related decline in LV function. 2. Seems to correlate with PVCs. 3. Overall education and awareness concerning primary risk prevention was discussed in detail: LDL less than 70, hemoglobin A1c less than 7, blood pressure target less than 130/80 mmHg, >150  minutes of moderate aerobic activity per week, avoidance of smoking, weight control (via diet and exercise), and continued surveillance/management of/for obstructive sleep apnea. 4. Continue Lipitor 20 mg/day.  Most recent LDL was fifty-eight. 5. Blood pressures from home have been quite good ranging between one hundred ten and 517 mmHg systolic.  She did have systolic elevations a day or two after the vaccine booster. 6. Not discussed 7. Boosted.  Practicing mitigation.  2D Doppler echocardiogram will be done.  If LV dysfunction, will consider referral to EP for management of PVCs.  Otherwise plan to see her back in 1 year.    Medication Adjustments/Labs and Tests Ordered: Current medicines are reviewed at length with the patient today.  Concerns regarding medicines are outlined above.  No orders of the defined types were placed in this encounter.  No orders of the defined types were placed in this encounter.   There are no Patient Instructions on file for this visit.   Signed, Sinclair Grooms, MD  06/14/2020 9:08 AM    [place]

## 2020-06-14 ENCOUNTER — Ambulatory Visit: Payer: Medicare HMO | Admitting: Interventional Cardiology

## 2020-06-14 ENCOUNTER — Encounter: Payer: Self-pay | Admitting: Interventional Cardiology

## 2020-06-14 ENCOUNTER — Other Ambulatory Visit: Payer: Self-pay

## 2020-06-14 VITALS — BP 154/82 | HR 54 | Ht 60.0 in | Wt 151.2 lb

## 2020-06-14 DIAGNOSIS — I10 Essential (primary) hypertension: Secondary | ICD-10-CM | POA: Diagnosis not present

## 2020-06-14 DIAGNOSIS — I700 Atherosclerosis of aorta: Secondary | ICD-10-CM | POA: Diagnosis not present

## 2020-06-14 DIAGNOSIS — Z7189 Other specified counseling: Secondary | ICD-10-CM

## 2020-06-14 DIAGNOSIS — N1832 Chronic kidney disease, stage 3b: Secondary | ICD-10-CM

## 2020-06-14 DIAGNOSIS — E785 Hyperlipidemia, unspecified: Secondary | ICD-10-CM

## 2020-06-14 DIAGNOSIS — R002 Palpitations: Secondary | ICD-10-CM

## 2020-06-14 DIAGNOSIS — I493 Ventricular premature depolarization: Secondary | ICD-10-CM | POA: Diagnosis not present

## 2020-06-14 NOTE — Patient Instructions (Signed)
Medication Instructions:  Your physician recommends that you continue on your current medications as directed. Please refer to the Current Medication list given to you today.  *If you need a refill on your cardiac medications before your next appointment, please call your pharmacy*   Lab Work: None If you have labs (blood work) drawn today and your tests are completely normal, you will receive your results only by: Marland Kitchen MyChart Message (if you have MyChart) OR . A paper copy in the mail If you have any lab test that is abnormal or we need to change your treatment, we will call you to review the results.   Testing/Procedures: Your physician has requested that you have an echocardiogram. Echocardiography is a painless test that uses sound waves to create images of your heart. It provides your doctor with information about the size and shape of your heart and how well your heart's chambers and valves are working. This procedure takes approximately one hour. There are no restrictions for this procedure.    Follow-Up: At [facility], you and your health needs are our priority.  As part of our continuing mission to provide you with exceptional heart care, we have created designated Provider Care Teams.  These Care Teams include your primary Cardiologist (physician) and Advanced Practice Providers (APPs -  Physician Assistants and Nurse Practitioners) who all work together to provide you with the care you need, when you need it.  We recommend signing up for the patient portal called "MyChart".  Sign up information is provided on this After Visit Summary.  MyChart is used to connect with patients for Virtual Visits (Telemedicine).  Patients are able to view lab/test results, encounter notes, upcoming appointments, etc.  Non-urgent messages can be sent to your provider as well.   To learn more about what you can do with MyChart, go to [email or web address].    Your next appointment:   1  year(s)  The format for your next appointment:   In Person  Provider:   You may see Sinclair Grooms, MD or one of the following Advanced Practice Providers on your designated Care Team:    Cecilie Kicks, NP  Kathyrn Drown, NP    Other Instructions

## 2020-06-28 ENCOUNTER — Other Ambulatory Visit: Payer: Self-pay

## 2020-06-28 ENCOUNTER — Other Ambulatory Visit (INDEPENDENT_AMBULATORY_CARE_PROVIDER_SITE_OTHER): Payer: Medicare HMO

## 2020-06-28 DIAGNOSIS — E538 Deficiency of other specified B group vitamins: Secondary | ICD-10-CM

## 2020-06-28 DIAGNOSIS — E785 Hyperlipidemia, unspecified: Secondary | ICD-10-CM

## 2020-06-28 DIAGNOSIS — M85851 Other specified disorders of bone density and structure, right thigh: Secondary | ICD-10-CM

## 2020-06-28 LAB — CBC WITH DIFFERENTIAL/PLATELET
Basophils Absolute: 0.1 10*3/uL (ref 0.0–0.1)
Basophils Relative: 1.1 % (ref 0.0–3.0)
Eosinophils Absolute: 0.1 10*3/uL (ref 0.0–0.7)
Eosinophils Relative: 2.7 % (ref 0.0–5.0)
HCT: 37.2 % (ref 36.0–46.0)
Hemoglobin: 12.7 g/dL (ref 12.0–15.0)
Lymphocytes Relative: 34.6 % (ref 12.0–46.0)
Lymphs Abs: 1.9 10*3/uL (ref 0.7–4.0)
MCHC: 34.2 g/dL (ref 30.0–36.0)
MCV: 90.5 fl (ref 78.0–100.0)
Monocytes Absolute: 0.8 10*3/uL (ref 0.1–1.0)
Monocytes Relative: 14.7 % — ABNORMAL HIGH (ref 3.0–12.0)
Neutro Abs: 2.5 10*3/uL (ref 1.4–7.7)
Neutrophils Relative %: 46.9 % (ref 43.0–77.0)
Platelets: 230 10*3/uL (ref 150.0–400.0)
RBC: 4.11 Mil/uL (ref 3.87–5.11)
RDW: 13.6 % (ref 11.5–15.5)
WBC: 5.4 10*3/uL (ref 4.0–10.5)

## 2020-06-28 LAB — VITAMIN D 25 HYDROXY (VIT D DEFICIENCY, FRACTURES): VITD: 40.78 ng/mL (ref 30.00–100.00)

## 2020-06-28 LAB — COMPREHENSIVE METABOLIC PANEL
ALT: 15 U/L (ref 0–35)
AST: 18 U/L (ref 0–37)
Albumin: 3.8 g/dL (ref 3.5–5.2)
Alkaline Phosphatase: 62 U/L (ref 39–117)
BUN: 16 mg/dL (ref 6–23)
CO2: 27 mEq/L (ref 19–32)
Calcium: 9.3 mg/dL (ref 8.4–10.5)
Chloride: 104 mEq/L (ref 96–112)
Creatinine, Ser: 1.21 mg/dL — ABNORMAL HIGH (ref 0.40–1.20)
GFR: 42.49 mL/min — ABNORMAL LOW (ref >60.00)
Glucose, Bld: 94 mg/dL (ref 70–99)
Potassium: 4 mEq/L (ref 3.5–5.1)
Sodium: 138 mEq/L (ref 135–145)
Total Bilirubin: 0.7 mg/dL (ref 0.2–1.2)
Total Protein: 6.3 g/dL (ref 6.0–8.3)

## 2020-06-28 LAB — VITAMIN B12: Vitamin B-12: 658 pg/mL (ref 211–911)

## 2020-06-28 LAB — LIPID PANEL
Cholesterol: 148 mg/dL (ref 0–200)
HDL: 76.2 mg/dL (ref >39.00)
LDL Cholesterol: 58 mg/dL (ref 0–99)
NonHDL: 71.83
Total CHOL/HDL Ratio: 2
Triglycerides: 69 mg/dL (ref 0.0–149.0)
VLDL: 13.8 mg/dL (ref 0.0–40.0)

## 2020-06-28 NOTE — Addendum Note (Signed)
Addended by: Autym Siess P on: 06/28/2020 07:56 AM   Modules accepted: Orders  

## 2020-06-28 NOTE — Addendum Note (Signed)
Addended by: Kolson Chovanec P on: 06/28/2020 07:56 AM   Modules accepted: Orders  

## 2020-06-28 NOTE — Addendum Note (Signed)
Addended by: Noelle Sease P on: 06/28/2020 07:56 AM   Modules accepted: Orders  

## 2020-06-28 NOTE — Addendum Note (Signed)
Addended by: Brandy Hale on: 06/28/2020 07:56 AM   Modules accepted: Orders

## 2020-06-28 NOTE — Addendum Note (Signed)
Addended by: Victorio Creeden P on: 06/28/2020 07:56 AM   Modules accepted: Orders  

## 2020-07-01 ENCOUNTER — Other Ambulatory Visit: Payer: Self-pay

## 2020-07-01 ENCOUNTER — Ambulatory Visit (HOSPITAL_COMMUNITY): Payer: Medicare HMO | Attending: Cardiovascular Disease

## 2020-07-01 DIAGNOSIS — Z853 Personal history of malignant neoplasm of breast: Secondary | ICD-10-CM | POA: Insufficient documentation

## 2020-07-01 DIAGNOSIS — I129 Hypertensive chronic kidney disease with stage 1 through stage 4 chronic kidney disease, or unspecified chronic kidney disease: Secondary | ICD-10-CM | POA: Diagnosis not present

## 2020-07-01 DIAGNOSIS — I493 Ventricular premature depolarization: Secondary | ICD-10-CM | POA: Diagnosis not present

## 2020-07-01 DIAGNOSIS — N189 Chronic kidney disease, unspecified: Secondary | ICD-10-CM | POA: Insufficient documentation

## 2020-07-01 DIAGNOSIS — E785 Hyperlipidemia, unspecified: Secondary | ICD-10-CM | POA: Diagnosis not present

## 2020-07-01 DIAGNOSIS — I340 Nonrheumatic mitral (valve) insufficiency: Secondary | ICD-10-CM

## 2020-07-01 LAB — ECHOCARDIOGRAM COMPLETE
Area-P 1/2: 1.65 cm2
S' Lateral: 3.2 cm

## 2020-07-01 NOTE — Progress Notes (Signed)
Phone [phone]   Subjective:  Patient presents today for their annual physical. Chief complaint-noted.   See problem oriented charting- ROS- full  review of systems was completed and negative except for: palpitations, back pain- better with stretching, seasonal allergies, food allergies  The following were reviewed and entered/updated in epic: Past Medical History:  Diagnosis Date  . ALLERGIC RHINITIS 01/31/2008  . Allergy   . Asthma   . BCC (basal cell carcinoma)sup& nod 03/01/2017   right nostril  . Breast cancer ([place])   . Breast cancer of lower-inner quadrant of left female breast ([place]) 11/01/2015  . Cataract   . Colon cancer screening 05/21/2014   No polyps at age 68. Diverticulosis alone-no more colonoscopies.    Marland Kitchen DIVERTICULITIS, HX OF 09/07/2008   pt denies this   . Eosinophilia 02/08/2009  . Fibroid   . Heart murmur    slight per pt.   Marland Kitchen HYPERLIPIDEMIA 12/20/2006  . Hypertension   . Osteopenia   . OSTEOPOROSIS 12/20/2006  . SCC (squamous cell carcinoma) well diff 11/28/2013   left side chin  . Vitamin B 12 deficiency    Patient Active Problem List   Diagnosis Date Noted  . Frequent PVCs 04/14/2017    Priority: High  . History of breast cancer- Breast cancer of lower-inner quadrant of left female breast 11/01/2015    Priority: High  . Low vitamin B12 level 06/24/2016    Priority: Medium  . CKD (chronic kidney disease), stage III ([place]) 10/22/2015    Priority: Medium  . Asthma, moderate persistent, well-controlled 04/19/2014    Priority: Medium  . Hypertension 11/25/2010    Priority: Medium  . Hyperlipidemia 12/20/2006    Priority: Medium  . Osteopenia 12/20/2006    Priority: Medium  . Vasomotor rhinitis 05/10/2019    Priority: Low  . Chronic allergic conjunctivitis 05/10/2019    Priority: Low  . Aortic atherosclerosis ([place]) 11/23/2016    Priority: Low  . Family history of abdominal aortic aneurysm 03/08/2015    Priority: Low  . Eosinophilia 02/08/2009     Priority: Low  . Allergic rhinitis 01/31/2008    Priority: Low   Past Surgical History:  Procedure Laterality Date  . BREAST LUMPECTOMY WITH NEEDLE LOCALIZATION Left 01/03/2016   Procedure: BREAST re-excision LUMPECTOMY WITH NEEDLE LOCALIZATION;  Surgeon: Rolm Bookbinder, MD;  Location: [place];  Service: General;  Laterality: Left;  BREAST re-excision LUMPECTOMY WITH NEEDLE LOCALIZATION  . BREAST LUMPECTOMY WITH RADIOACTIVE SEED LOCALIZATION Left 11/28/2015   Procedure: LEFT BREAST LUMPECTOMY WITH BRACKETED RADIOACTIVE SEED LOCALIZATION;  Surgeon: Rolm Bookbinder, MD;  Location: [place];  Service: General;  Laterality: Left;  . BREAST SURGERY     cysts removed x2  . CATARACT EXTRACTION BILATERAL W/ ANTERIOR VITRECTOMY  2013   bilateral cataracts  . COLONOSCOPY  2005   tics only   . FOOT SURGERY    . KNEE ARTHROSCOPY     left  . NEUROMA SURGERY     x2 feet  . thyroid duct cyst      Family History  Problem Relation Age of Onset  . COPD Father   . Heart disease Father        stent placed 27  . Allergic rhinitis Father   . Brain cancer Mother   . Allergic rhinitis Maternal Grandmother   . Colon cancer Neg Hx   . Stomach cancer Neg Hx     Medications- reviewed and updated Current Outpatient Medications  Medication Sig  Dispense Refill  . Albuterol Sulfate (PROAIR RESPICLICK) [id number] (90 Base) MCG/ACT AEPB Inhale 1 puff into the lungs every 8 (eight) hours as needed (wheezing/shortness of breath).    Marland Kitchen azelastine (OPTIVAR) 0.05 % ophthalmic solution     . BREO ELLIPTA 200-25 MCG/INH AEPB Inhale 1 puff into the lungs daily.    . Calcium 500-125 MG-UNIT TABS Take 1 tablet by mouth daily.    . cetirizine (ZYRTEC) 10 MG tablet Take 10 mg by mouth daily.    . cholecalciferol (VITAMIN D) 1000 UNITS tablet Take 2,000 Units by mouth daily.    . fluticasone (FLONASE) 50 MCG/ACT nasal spray Place 1 spray into both nostrils daily as needed.     .  fluticasone furoate-vilanterol (BREO ELLIPTA) 100-25 MCG/INH AEPB Inhale 1 puff into the lungs daily.    Marland Kitchen LORazepam (ATIVAN) 0.5 MG tablet Take 1 tablet (0.5 mg total) by mouth 2 (two) times daily as needed for anxiety. 20 tablet 0  . metoprolol succinate (TOPROL-XL) 50 MG 24 hr tablet Take 1 tablet (50 mg total) by mouth daily. With or immediately following a meal. Please keep upcoming appt with Dr. Tamala Julian in January 2022. Thanks 90 tablet 0  . montelukast (SINGULAIR) 10 MG tablet Take 1 tablet by mouth at bedtime.    Marland Kitchen atorvastatin (LIPITOR) 20 MG tablet Take 1 tablet (20 mg total) by mouth daily. 90 tablet 3  . irbesartan (AVAPRO) 150 MG tablet Take 1 tablet (150 mg total) by mouth daily. 90 tablet 3   No current facility-administered medications for this visit.    Allergies-reviewed and updated Allergies  Allergen Reactions  . Lisinopril Other (See Comments) and Cough    Dry cough and stinging rash  . Latex Rash  . Penicillins Rash    Has patient had a PCN reaction causing immediate rash, facial/tongue/throat swelling, SOB or lightheadedness with hypotension: Yes Has patient had a PCN reaction causing severe rash involving mucus membranes or skin necrosis: No Has patient had a PCN reaction that required hospitalization No Has patient had a PCN reaction occurring within the last 10 years: No If all of the above answers are "NO", then may proceed with Cephalosporin use.   . Sulfonamide Derivatives Rash  . Tape Rash    Band-aids break out skin!!    Social History   Social History Narrative   Married. No kids- 2 step kids. 5 step grandkids.       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 Oropharynx normal Neck: no thyromegaly CV: RRR no murmurs rubs or gallops Lungs: CTAB no crackles, wheeze, rhonchi Abdomen: soft/nontender/nondistended/normal bowel sounds. No rebound or guarding.  Ext: no edema Skin: warm, dry Neuro: grossly normal, moves all extremities, PERRLA   Assessment and Plan   80 y.o. female presenting for annual physical.  Health Maintenance counseling: 1. Anticipatory guidance: Patient counseled regarding regular dental exams -q6 months, eye exams - yearly- multifocus lens in a few years ago- - may have to have laser on right eye for film that can develop on it,  avoiding smoking and second hand smoke ,  limiting alcohol to 1 beverage per day- doesn't drink at all .   2. Risk factor reduction:  Advised patient of need for regular exercise and diet rich and fruits and vegetables to reduce risk of heart attack and stroke. Exercise- walking on treadmill- has fallen off on squats and band exercises- some stretching. Diet-weight stable- reasonably healthy diet- limits beef intake.  Wt Readings from Last 3 Encounters:  07/02/20 149 lb 9.6 oz (67.9 kg)  06/14/20 151 lb 3.2 oz (68.6 kg)  04/02/20 150 lb (68 kg)   3. Immunizations/screenings/ancillary studies- fully up to date Immunization History  Administered Date(s) Administered  . Fluad Quad(high Dose 65+) 04/07/2019, 05/07/2020  . Influenza Whole 03/02/2011  . Influenza, High Dose Seasonal PF 05/04/2016, 05/05/2019  . PFIZER(Purple Top)SARS-COV-2 Vaccination 06/22/2019, 07/13/2019, 03/07/2020, 05/03/2020  . Pneumococcal Conjugate-13 01/19/2007, 04/19/2014, 06/06/2015  . Pneumococcal Polysaccharide-23 01/19/2007, 10/08/2014, 11/04/2015, 05/03/2017, 05/10/2018, 05/05/2019, 05/03/2020  . Td 07/02/2002, 09/23/2017  . Tdap 08/22/2012  . Zoster 02/10/2010  . Zoster Recombinat (Shingrix) 12/28/2018  4. Cervical cancer screening- past age based screeing recommendations. Never had abnormal pap. No discharge or  blood noted.  5. Breast cancer history-  breast exam with Dr. Donne Hazel- until ta least 5 years out. Not having to see Dr. Lindi Adie anymore. Mammograms in june 6. Colon cancer screening - last may 2015 with no recall due to age. No blood in stool or melena 7. Skin cancer screening- Dr. Denna Haggard yearly. advised regular sunscreen use. Denies worrisome, changing, or new skin lesions.  8. Birth control/STD check- only active with husband and postmenopausal 9. Osteoporosis screening at 44- see below -Never smoker smoker  Status of chronic or acute concerns   #history of breast cancer- Malignant neoplasm of lower-inner quadrant of left breast in female, estrogen receptor positive -patient now off tamoxifen due to recurrent UTI. Close follow-up with Dr. Donne Hazel only now (prior Dr. Lindi Adie as well)  #hyperlipidemia #aortic atherosclerosis- LDL goal under 70 ideally S: Medication:atorvastatin 20 mg  A/P: well controlled with LDL under 70 for lipids- this is also goal for aortic atherosclerosis.   #hypertension/PVCs on metoprolol S: medication: irbesartan 150mg , metoprolol 50 mg XR (pvcs doing well on this- did have recent echo leading to upcoming stress test but I provided some reassurance today) Home readings #s:  home readings generally better than in office. Brings multiple readings and did an average of 10 readings and avg 131.5 76   BP Readings from Last 3 Encounters:  07/02/20 (!) 160/68  06/14/20 (!) 154/82  04/02/20 120/80  A/P: poor control in office, better control at home but we have checked home cuff before and accurate- continue to lean on home readings. Occasionally ok in office  #Chronic kidney disease stage III S: GFR is typically in the 40s range -Patient knows to avoid NSAIDs  -hold irbesartan if gets dehydrated A/P: Stable. Continue current medications.    # Asthma/allergies- follows with Dr. Rush Landmark: Maintenance Medication: Memory Dance and singulair (astelin and flonase and  zyrtec as needed as well) As needed medication: albuterol. Patient is using this extremely rarely  A/P: doing well- continue current meds and allergist follow up    # Low Bone density (formerly osteopenia) S: Last DEXA: 12/2017 of right and left femur neck showed 1% decrease despite not taking fosamax and she preferred to monitor every 2 years. Due to elevated frax score had recommended fosamax which she declined.   Calcium: 1200mg  (through diet ok) recommended - 600 through pill and some through diet  Vitamin D: 1000 units a day recommended- she takes this Last vitamin D- normal January 2022  A/P: hopefully stable- continue calcium/vitamin d and update bone density   # B12 deficiency S: Current treatment/medication (oral vs. IM):  b12 every other day now A/P: levels controlled continue current rx   Recommended follow up:  Return in about 6 months (around 12/30/2020) for follow up- or sooner if needed.  Lab/Order associations:already had  Fasting labs   ICD-10-CM   1. Preventative health care  Z00.00   2. Primary hypertension  I10   3. Hyperlipidemia, unspecified hyperlipidemia type  E78.5 atorvastatin (LIPITOR) 20 MG tablet  4. Low vitamin B12 level  E53.8   5. Osteopenia of neck of right femur  M85.851 DG Bone Density    Meds ordered this encounter  Medications  . atorvastatin (LIPITOR) 20 MG tablet    Sig: Take 1 tablet (20 mg total) by mouth daily.    Dispense:  90 tablet    Refill:  3    Return precautions advised.  Garret Reddish, MD

## 2020-07-01 NOTE — Patient Instructions (Addendum)
You are eligible to schedule your annual wellness visit with our nurse specialist Otila Kluver.  Please consider scheduling this before you leave today   Schedule your bone density test at check out desk. You may also call directly to X-ray at [phone] to schedule an appointment that is convenient for you.  - located [address] across the street from [facility] - in the basement - you do need an appointment for the bone density tests.   Thanks for checking BPs at home- very helpful since you run higher in office  Recommended follow up: Return in about 6 months (around 12/30/2020) for follow up- or sooner if needed.

## 2020-07-02 ENCOUNTER — Ambulatory Visit (INDEPENDENT_AMBULATORY_CARE_PROVIDER_SITE_OTHER): Payer: Medicare HMO | Admitting: Family Medicine

## 2020-07-02 ENCOUNTER — Encounter: Payer: Self-pay | Admitting: Family Medicine

## 2020-07-02 ENCOUNTER — Telehealth: Payer: Self-pay | Admitting: *Deleted

## 2020-07-02 ENCOUNTER — Other Ambulatory Visit: Payer: Self-pay

## 2020-07-02 VITALS — BP 160/68 | HR 61 | Temp 97.9°F | Ht 60.0 in | Wt 149.6 lb

## 2020-07-02 DIAGNOSIS — I10 Essential (primary) hypertension: Secondary | ICD-10-CM

## 2020-07-02 DIAGNOSIS — M85851 Other specified disorders of bone density and structure, right thigh: Secondary | ICD-10-CM

## 2020-07-02 DIAGNOSIS — Z0000 Encounter for general adult medical examination without abnormal findings: Secondary | ICD-10-CM

## 2020-07-02 DIAGNOSIS — E538 Deficiency of other specified B group vitamins: Secondary | ICD-10-CM

## 2020-07-02 DIAGNOSIS — E785 Hyperlipidemia, unspecified: Secondary | ICD-10-CM

## 2020-07-02 DIAGNOSIS — I493 Ventricular premature depolarization: Secondary | ICD-10-CM

## 2020-07-02 DIAGNOSIS — I700 Atherosclerosis of aorta: Secondary | ICD-10-CM

## 2020-07-02 MED ORDER — ATORVASTATIN CALCIUM 20 MG PO TABS: 20.0000 mg | ORAL_TABLET | Freq: Every day | ORAL | 3 refills | Status: DC

## 2020-07-02 MED ORDER — IRBESARTAN 150 MG PO TABS: 150.0000 mg | ORAL_TABLET | Freq: Every day | ORAL | 3 refills | Status: DC

## 2020-07-02 NOTE — Addendum Note (Signed)
Addended by: Loren Racer on: 07/02/2020 11:53 AM   Modules accepted: Orders

## 2020-07-02 NOTE — Telephone Encounter (Signed)
Will route to Dr. Tamala Julian to sign attestation

## 2020-07-02 NOTE — Telephone Encounter (Signed)
-----   Message from Belva Crome, MD sent at 07/01/2020  6:23 PM EST ----- Let the patient know the study is similar to 2018 except the inferior wall moves less well, implying blockage. Needs nuclear stress since she has a lot of PVC's and won't gate on CTA A copy will be sent to Marin Olp, MD

## 2020-07-02 NOTE — Telephone Encounter (Signed)
Spoke with pt and made her aware of results and recommendations per Dr. Tamala Julian.  Pt agreeable to plan.  She states she is able to walk on a treadmill.  Advised I will place order and our office will be in contact to get her scheduled.

## 2020-07-03 NOTE — Addendum Note (Signed)
Addended by: Belva Crome on: 07/03/2020 11:05 AM   Modules accepted: Orders

## 2020-07-09 ENCOUNTER — Other Ambulatory Visit: Payer: Self-pay

## 2020-07-09 ENCOUNTER — Ambulatory Visit (INDEPENDENT_AMBULATORY_CARE_PROVIDER_SITE_OTHER)
Admission: RE | Admit: 2020-07-09 | Discharge: 2020-07-09 | Disposition: A | Discharge status: Home / Self Care | Payer: Medicare HMO | Source: Ambulatory Visit | Attending: Family Medicine | Admitting: Family Medicine

## 2020-07-09 DIAGNOSIS — M85851 Other specified disorders of bone density and structure, right thigh: Secondary | ICD-10-CM | POA: Diagnosis not present

## 2020-07-12 ENCOUNTER — Encounter: Payer: Self-pay | Admitting: Family Medicine

## 2020-07-15 ENCOUNTER — Encounter: Payer: Self-pay | Admitting: Family Medicine

## 2020-07-17 ENCOUNTER — Telehealth (HOSPITAL_COMMUNITY): Payer: Self-pay | Admitting: *Deleted

## 2020-07-17 NOTE — Telephone Encounter (Signed)
Left message on voicemail per DPR in reference to upcoming appointment scheduled on 07/22/20 with detailed instructions given per Myocardial Perfusion Study Information Sheet for the test. LM to arrive 15 minutes early, and that it is imperative to arrive on time for appointment to keep from having the test rescheduled. If you need to cancel or reschedule your appointment, please call the office within 24 hours of your appointment. Failure to do so may result in a cancellation of your appointment, and a $50 no show fee. Phone number given for call back for any questions. Kirstie Peri

## 2020-07-19 ENCOUNTER — Other Ambulatory Visit (HOSPITAL_COMMUNITY)
Admission: RE | Admit: 2020-07-19 | Discharge: 2020-07-19 | Disposition: A | Discharge status: Home / Self Care | Payer: Medicare HMO | Source: Ambulatory Visit | Attending: Interventional Cardiology | Admitting: Interventional Cardiology

## 2020-07-19 DIAGNOSIS — U071 COVID-19: Secondary | ICD-10-CM | POA: Insufficient documentation

## 2020-07-19 DIAGNOSIS — Z01812 Encounter for preprocedural laboratory examination: Secondary | ICD-10-CM | POA: Diagnosis not present

## 2020-07-19 LAB — SARS CORONAVIRUS 2 (TAT 6-24 HRS): SARS Coronavirus 2: POSITIVE — AB

## 2020-07-20 ENCOUNTER — Telehealth: Payer: Self-pay | Admitting: Family

## 2020-07-20 NOTE — Progress Notes (Signed)
Patient had pre procedure covid test 2/18, results are +.  Notified Rosaria Ferries, PA.  She will notify patient and the office.  Patient can be rescheduled for 10 days out if mild symptoms/no symptoms, 21 days if symptomatic/immunocompromised.

## 2020-07-20 NOTE — Telephone Encounter (Signed)
Called to discuss with patient about COVID-19 symptoms and the use of one of the available treatments for those with mild to moderate Covid symptoms and at a high risk of hospitalization.  Pt appears to qualify for outpatient treatment due to co-morbid conditions and/or a member of an at-risk group in accordance with the FDA Emergency Use Authorization.    Symptom onset: No symptoms Vaccinated: Yes Booster? Yes Immunocompromised? No Qualifiers: CKD Stage III, Asthma, Age, Hypertension, Hyperlipidemia  Spoke with Lisa Greene who is not having any current symptoms and feels at her baseline. Was scheduled for a stress test. Does not meet current treatment guidelines and informed to monitor for symptoms should they develop.  [name], NP 07/20/2020 9:34 AM

## 2020-07-22 ENCOUNTER — Ambulatory Visit (HOSPITAL_COMMUNITY): Payer: Medicare HMO

## 2020-07-22 ENCOUNTER — Other Ambulatory Visit: Payer: Self-pay

## 2020-08-01 ENCOUNTER — Telehealth (HOSPITAL_COMMUNITY): Payer: Self-pay | Admitting: *Deleted

## 2020-08-01 ENCOUNTER — Encounter (HOSPITAL_COMMUNITY): Payer: Self-pay | Admitting: *Deleted

## 2020-08-01 NOTE — Telephone Encounter (Signed)
Left message on voicemail per DPR in reference to upcoming appointment scheduled on 08/05/20 with detailed instructions given per Myocardial Perfusion Study Information Sheet for the test. LM to arrive 15 minutes early, and that it is imperative to arrive on time for appointment to keep from having the test rescheduled. If you need to cancel or reschedule your appointment, please call the office within 24 hours of your appointment. Failure to do so may result in a cancellation of your appointment, and a $50 no show fee. Phone number given for call back for any questions. Kirstie Peri

## 2020-08-05 ENCOUNTER — Other Ambulatory Visit: Payer: Self-pay

## 2020-08-05 ENCOUNTER — Ambulatory Visit (HOSPITAL_COMMUNITY): Payer: Medicare HMO | Attending: Internal Medicine

## 2020-08-05 VITALS — Ht 60.0 in | Wt 149.0 lb

## 2020-08-05 DIAGNOSIS — I10 Essential (primary) hypertension: Secondary | ICD-10-CM | POA: Diagnosis not present

## 2020-08-05 DIAGNOSIS — I700 Atherosclerosis of aorta: Secondary | ICD-10-CM | POA: Diagnosis not present

## 2020-08-05 DIAGNOSIS — I493 Ventricular premature depolarization: Secondary | ICD-10-CM | POA: Diagnosis not present

## 2020-08-05 LAB — MYOCARDIAL PERFUSION IMAGING
Estimated workload: 7 METS
Exercise duration (min): 5 min
Exercise duration (sec): 16 s
LV dias vol: 57 mL (ref 46–106)
LV sys vol: 36 mL
MPHR: 141 {beats}/min
Peak HR: 142 {beats}/min
Percent HR: 101 %
RPE: 18
Rest HR: 81 {beats}/min
SDS: 2
SRS: 0
SSS: 2
TID: 0.94

## 2020-08-05 MED ORDER — TECHNETIUM TC 99M TETROFOSMIN IV KIT
31.1000 | PACK | Freq: Once | INTRAVENOUS | Status: AC | PRN
  Administered 2020-08-05: 31.1 via INTRAVENOUS
  Filled 2020-08-05: qty 32

## 2020-08-05 MED ORDER — TECHNETIUM TC 99M TETROFOSMIN IV KIT
10.7000 | PACK | Freq: Once | INTRAVENOUS | Status: AC | PRN
  Administered 2020-08-05: 10.7 via INTRAVENOUS
  Filled 2020-08-05: qty 11

## 2020-08-08 ENCOUNTER — Other Ambulatory Visit: Payer: Self-pay | Admitting: *Deleted

## 2020-08-08 DIAGNOSIS — I493 Ventricular premature depolarization: Secondary | ICD-10-CM

## 2020-08-26 ENCOUNTER — Encounter: Payer: Self-pay | Admitting: Internal Medicine

## 2020-08-26 ENCOUNTER — Ambulatory Visit: Payer: Medicare HMO | Admitting: Internal Medicine

## 2020-08-26 ENCOUNTER — Other Ambulatory Visit: Payer: Self-pay

## 2020-08-26 VITALS — BP 130/74 | HR 70 | Ht 60.0 in | Wt 152.4 lb

## 2020-08-26 DIAGNOSIS — I493 Ventricular premature depolarization: Secondary | ICD-10-CM

## 2020-08-26 NOTE — Progress Notes (Signed)
Electrophysiology Office Note   Date:  08/26/2020   ID:  Mayley, Lish [birth year], MRN [id number]  PCP:  Marin Olp, MD  Cardiologist:  Dr Tamala Julian Primary Electrophysiologist: Thompson Grayer, MD    CC: PVCs   History of Present Illness: Lisa Greene is a 80 y.o. female who presents today for electrophysiology evaluation.   The patient is referred by Dr Tamala Julian for EP consultation regarding PVCs. s he reports initially developing palpitations after XRT for left sided breast cancer 4 years ago.  She then had a monitor placed 2018 which revealed 28% PVC burden in 48 hours.  She has done well with low dose toprol.  She did noticed increased palpitations after her moderna vaccine with associated fatigue.  This has resolved.  She has rare SOB and fatigue.   Does not feel palpitations.  Today, she denies symptoms of chest pain, shortness of breath, orthopnea, PND, lower extremity edema, claudication, dizziness, presyncope, syncope, bleeding, or neurologic sequela. The patient is tolerating medications without difficulties and is otherwise without complaint today.    Past Medical History:  Diagnosis Date  . ALLERGIC RHINITIS 01/31/2008  . Allergy   . Asthma   . BCC (basal cell carcinoma)sup& nod 03/01/2017   right nostril  . Breast cancer ([place])   . Breast cancer of lower-inner quadrant of left female breast ([place]) 11/01/2015  . Cataract   . Colon cancer screening 05/21/2014   No polyps at age 5. Diverticulosis alone-no more colonoscopies.    Marland Kitchen DIVERTICULITIS, HX OF 09/07/2008   pt denies this   . Eosinophilia 02/08/2009  . Fibroid   . Heart murmur    slight per pt.   Marland Kitchen HYPERLIPIDEMIA 12/20/2006  . Hypertension   . Osteopenia   . OSTEOPOROSIS 12/20/2006  . SCC (squamous cell carcinoma) well diff 11/28/2013   left side chin  . Vitamin B 12 deficiency    Past Surgical History:  Procedure Laterality Date  . BREAST LUMPECTOMY WITH NEEDLE LOCALIZATION Left 01/03/2016    Procedure: BREAST re-excision LUMPECTOMY WITH NEEDLE LOCALIZATION;  Surgeon: Rolm Bookbinder, MD;  Location: [place];  Service: General;  Laterality: Left;  BREAST re-excision LUMPECTOMY WITH NEEDLE LOCALIZATION  . BREAST LUMPECTOMY WITH RADIOACTIVE SEED LOCALIZATION Left 11/28/2015   Procedure: LEFT BREAST LUMPECTOMY WITH BRACKETED RADIOACTIVE SEED LOCALIZATION;  Surgeon: Rolm Bookbinder, MD;  Location: [facility];  Service: General;  Laterality: Left;  . BREAST SURGERY     cysts removed x2  . CATARACT EXTRACTION BILATERAL W/ ANTERIOR VITRECTOMY  2013   bilateral cataracts  . COLONOSCOPY  2005   tics only   . FOOT SURGERY    . KNEE ARTHROSCOPY     left  . NEUROMA SURGERY     x2 feet  . thyroid duct cyst       Current Outpatient Medications  Medication Sig Dispense Refill  . Albuterol Sulfate (PROAIR RESPICLICK) 638 (90 Base) MCG/ACT AEPB Inhale 1 puff into the lungs every 8 (eight) hours as needed (wheezing/shortness of breath).    Marland Kitchen atorvastatin (LIPITOR) 20 MG tablet Take 1 tablet (20 mg total) by mouth daily. 90 tablet 3  . azelastine (OPTIVAR) 0.05 % ophthalmic solution     . BREO ELLIPTA 200-25 MCG/INH AEPB Inhale 1 puff into the lungs daily.    . Calcium 500-125 MG-UNIT TABS Take 1 tablet by mouth daily.    . cetirizine (ZYRTEC) 10 MG tablet Take 10 mg by mouth daily.    Marland Kitchen  cholecalciferol (VITAMIN D) 1000 UNITS tablet Take 2,000 Units by mouth daily.    . fluticasone (FLONASE) 50 MCG/ACT nasal spray Place 1 spray into both nostrils daily as needed.     . fluticasone furoate-vilanterol (BREO ELLIPTA) 100-25 MCG/INH AEPB Inhale 1 puff into the lungs daily.    . irbesartan (AVAPRO) 150 MG tablet Take 1 tablet (150 mg total) by mouth daily. 90 tablet 3  . metoprolol succinate (TOPROL-XL) 50 MG 24 hr tablet Take 1 tablet (50 mg total) by mouth daily. With or immediately following a meal. Please keep upcoming appt with Dr. Tamala Julian in January 2022.  Thanks 90 tablet 0  . montelukast (SINGULAIR) 10 MG tablet Take 1 tablet by mouth at bedtime.     No current facility-administered medications for this visit.    Allergies:   Lisinopril, Latex, Penicillins, Sulfonamide derivatives, and Tape   Social History:  The patient  reports that she has never smoked. She has never used smokeless tobacco. She reports that she does not drink alcohol and does not use drugs.   Family History:  The patient's family history includes Allergic rhinitis in her father and maternal grandmother; Brain cancer in her mother; COPD in her father; Heart disease in her father.    ROS:  Please see the history of present illness.   All other systems are personally reviewed and negative.    PHYSICAL EXAM: VS:  BP 130/74   Pulse 70   Ht 5' (1.524 m)   Wt 152 lb 6.4 oz (69.1 kg)   SpO2 99%   BMI 29.76 kg/m  , BMI Body mass index is 29.76 kg/m. GEN: Well nourished, well developed, in no acute distress HEENT: normal Neck: no JVD, carotid bruits, or masses Cardiac: RRR; no murmurs, rubs, or gallops,no edema  Respiratory:  clear to auscultation bilaterally, normal work of breathing GI: soft, nontender, nondistended, + BS MS: no deformity or atrophy Skin: warm and dry  Neuro:  Strength and sensation are intact Psych: euthymic mood, full affect  EKG:  EKG is ordered today. The ekg ordered today is personally reviewed and shows sinus rhythm with only a single PVC ekgs from 2020 and 2021 are also reviewed and show no PVCs.   Recent Labs: 03/28/2020: TSH 3.91 06/28/2020: ALT 15; BUN 16; Creatinine, Ser 1.21; Hemoglobin 12.7; Platelets 230.0; Potassium 4.0; Sodium 138  personally reviewed   Lipid Panel     Component Value Date/Time   CHOL 148 06/28/2020 0756   TRIG 69.0 06/28/2020 0756   HDL 76.20 06/28/2020 0756   CHOLHDL 2 06/28/2020 0756   VLDL 13.8 06/28/2020 0756   LDLCALC 58 06/28/2020 0756   LDLCALC 58 04/02/2020 1138   LDLDIRECT 59.0 10/17/2019  0930   personally reviewed   Wt Readings from Last 3 Encounters:  08/26/20 152 lb 6.4 oz (69.1 kg)  08/05/20 149 lb (67.6 kg)  07/22/20 149 lb (67.6 kg)    Event monitor 05/16/20- PVC burden 15%, average HR 74 bpm, (range 48-114 bpm)  Echo 07/01/20- EF 50-55%, normal RV size/ function  myoview 08/05/20- no ischemia  Other studies personally reviewed: Additional studies/ records that were reviewed today include: Dr Darliss Ridgel notes, prior monitor, echo, myoview  Review of the above records today demonstrates: as above   ASSESSMENT AND PLAN:  1.  PVCs Relatively asymptomatic EF preserved Very few PVCs on ekgs in the office suggesting that her PVCs are somewhat episodic in nature.  I would not advise ablation or AADs at this  time.   Continue conservative measures with toprol.   Follow-up:  With Dr Tamala Julian as scheduled I will see as needed going forward   Signed, Thompson Grayer, MD  08/26/2020 10:22 AM     [address] [place]  [phone] (office) [phone] (fax)

## 2020-08-26 NOTE — Patient Instructions (Signed)
Medication Instructions:  Your physician recommends that you continue on your current medications as directed. Please refer to the Current Medication list given to you today.  Labwork: None ordered.  Testing/Procedures: None ordered.  Follow-Up: Your physician wants you to follow-up in: As needed with Thompson Grayer, MD     Any Other Special Instructions Will Be Listed Below (If Applicable).  If you need a refill on your cardiac medications before your next appointment, please call your pharmacy.

## 2020-08-27 ENCOUNTER — Telehealth: Payer: Self-pay | Admitting: Family Medicine

## 2020-08-27 NOTE — Telephone Encounter (Signed)
Left message for patient to call back and schedule Medicare Annual Wellness Visit (AWV) either virtually or in office.   Last AWV 03/16/2019 please schedule at anytime    This should be a 45 minute visit.

## 2020-09-02 ENCOUNTER — Ambulatory Visit (INDEPENDENT_AMBULATORY_CARE_PROVIDER_SITE_OTHER): Payer: Medicare HMO

## 2020-09-02 DIAGNOSIS — Z0000 Encounter for general adult medical examination without abnormal findings: Secondary | ICD-10-CM

## 2020-09-02 NOTE — Patient Instructions (Addendum)
Lisa Greene , Thank you for taking time to come for your Medicare Wellness Visit. I appreciate your ongoing commitment to your health goals. Please review the following plan we discussed and let me know if I can assist you in the future.   Screening recommendations/referrals: Colonoscopy: No longer required  Mammogram: Done 11/23/19 Bone Density: Done 07/09/20 Recommended yearly ophthalmology/optometry visit for glaucoma screening and checkup Recommended yearly dental visit for hygiene and checkup  Vaccinations: Influenza vaccine: Up to date Pneumococcal vaccine: Up to date Tdap vaccine: Up to date Shingles vaccine: dose 12/28/18   Covid-19:Completed 1/21, 2/11, 10/7, & 05/03/20  Advanced directives: Please bring a copy of your health care power of attorney and living will to the office at your convenience.  Conditions/risks identified: Keep weight off   Next appointment: Follow up in one year for your annual wellness visit    Preventive Care 80 Years and Older, Female Preventive care refers to lifestyle choices and visits with your health care provider that can promote health and wellness. What does preventive care include?  A yearly physical exam. This is also called an annual well check.  Dental exams once or twice a year.  Routine eye exams. Ask your health care provider how often you should have your eyes checked.  Personal lifestyle choices, including:  Daily care of your teeth and gums.  Regular physical activity.  Eating a healthy diet.  Avoiding tobacco and drug use.  Limiting alcohol use.  Practicing safe sex.  Taking low-dose aspirin every day.  Taking vitamin and mineral supplements as recommended by your health care provider. What happens during an annual well check? The services and screenings done by your health care provider during your annual well check will depend on your age, overall health, lifestyle risk factors, and family history of  disease. Counseling  Your health care provider may ask you questions about your:  Alcohol use.  Tobacco use.  Drug use.  Emotional well-being.  Home and relationship well-being.  Sexual activity.  Eating habits.  History of falls.  Memory and ability to understand (cognition).  Work and work Statistician.  Reproductive health. Screening  You may have the following tests or measurements:  Height, weight, and BMI.  Blood pressure.  Lipid and cholesterol levels. These may be checked every 5 years, or more frequently if you are over 45 years old.  Skin check.  Lung cancer screening. You may have this screening every year starting at age 80 if you have a 30-pack-year history of smoking and currently smoke or have quit within the past 15 years.  Fecal occult blood test (FOBT) of the stool. You may have this test every year starting at age 80.  Flexible sigmoidoscopy or colonoscopy. You may have a sigmoidoscopy every 5 years or a colonoscopy every 10 years starting at age 80.  Hepatitis C blood test.  Hepatitis B blood test.  Sexually transmitted disease (STD) testing.  Diabetes screening. This is done by checking your blood sugar (glucose) after you have not eaten for a while (fasting). You may have this done every 1-3 years.  Bone density scan. This is done to screen for osteoporosis. You may have this done starting at age 80.  Mammogram. This may be done every 1-2 years. Talk to your health care provider about how often you should have regular mammograms. Talk with your health care provider about your test results, treatment options, and if necessary, the need for more tests. Vaccines  Your health care  provider may recommend certain vaccines, such as:  Influenza vaccine. This is recommended every year.  Tetanus, diphtheria, and acellular pertussis (Tdap, Td) vaccine. You may need a Td booster every 10 years.  Zoster vaccine. You may need this after age  80.  Pneumococcal 13-valent conjugate (PCV13) vaccine. One dose is recommended after age 80.  Pneumococcal polysaccharide (PPSV23) vaccine. One dose is recommended after age 80. Talk to your health care provider about which screenings and vaccines you need and how often you need them. This information is not intended to replace advice given to you by your health care provider. Make sure you discuss any questions you have with your health care provider. Document Released: 06/14/2015 Document Revised: 02/05/2016 Document Reviewed: 03/19/2015 Elsevier Interactive Patient Education  2017 [employer] Prevention in the Home Falls can cause injuries. They can happen to people of all ages. There are many things you can do to make your home safe and to help prevent falls. What can I do on the outside of my home?  Regularly fix the edges of walkways and driveways and fix any cracks.  Remove anything that might make you trip as you walk through a door, such as a raised step or threshold.  Trim any bushes or trees on the path to your home.  Use bright outdoor lighting.  Clear any walking paths of anything that might make someone trip, such as rocks or tools.  Regularly check to see if handrails are loose or broken. Make sure that both sides of any steps have handrails.  Any raised decks and porches should have guardrails on the edges.  Have any leaves, snow, or ice cleared regularly.  Use sand or salt on walking paths during winter.  Clean up any spills in your garage right away. This includes oil or grease spills. What can I do in the bathroom?  Use night lights.  Install grab bars by the toilet and in the tub and shower. Do not use towel bars as grab bars.  Use non-skid mats or decals in the tub or shower.  If you need to sit down in the shower, use a plastic, non-slip stool.  Keep the floor dry. Clean up any water that spills on the floor as soon as it happens.  Remove  soap buildup in the tub or shower regularly.  Attach bath mats securely with double-sided non-slip rug tape.  Do not have throw rugs and other things on the floor that can make you trip. What can I do in the bedroom?  Use night lights.  Make sure that you have a light by your bed that is easy to reach.  Do not use any sheets or blankets that are too big for your bed. They should not hang down onto the floor.  Have a firm chair that has side arms. You can use this for support while you get dressed.  Do not have throw rugs and other things on the floor that can make you trip. What can I do in the kitchen?  Clean up any spills right away.  Avoid walking on wet floors.  Keep items that you use a lot in easy-to-reach places.  If you need to reach something above you, use a strong step stool that has a grab bar.  Keep electrical cords out of the way.  Do not use floor polish or wax that makes floors slippery. If you must use wax, use non-skid floor wax.  Do not have throw  rugs and other things on the floor that can make you trip. What can I do with my stairs?  Do not leave any items on the stairs.  Make sure that there are handrails on both sides of the stairs and use them. Fix handrails that are broken or loose. Make sure that handrails are as long as the stairways.  Check any carpeting to make sure that it is firmly attached to the stairs. Fix any carpet that is loose or worn.  Avoid having throw rugs at the top or bottom of the stairs. If you do have throw rugs, attach them to the floor with carpet tape.  Make sure that you have a light switch at the top of the stairs and the bottom of the stairs. If you do not have them, ask someone to add them for you. What else can I do to help prevent falls?  Wear shoes that:  Do not have high heels.  Have rubber bottoms.  Are comfortable and fit you well.  Are closed at the toe. Do not wear sandals.  If you use a  stepladder:  Make sure that it is fully opened. Do not climb a closed stepladder.  Make sure that both sides of the stepladder are locked into place.  Ask someone to hold it for you, if possible.  Clearly mark and make sure that you can see:  Any grab bars or handrails.  First and last steps.  Where the edge of each step is.  Use tools that help you move around (mobility aids) if they are needed. These include:  Canes.  Walkers.  Scooters.  Crutches.  Turn on the lights when you go into a dark area. Replace any light bulbs as soon as they burn out.  Set up your furniture so you have a clear path. Avoid moving your furniture around.  If any of your floors are uneven, fix them.  If there are any pets around you, be aware of where they are.  Review your medicines with your doctor. Some medicines can make you feel dizzy. This can increase your chance of falling. Ask your doctor what other things that you can do to help prevent falls. This information is not intended to replace advice given to you by your health care provider. Make sure you discuss any questions you have with your health care provider. Document Released: 03/14/2009 Document Revised: 10/24/2015 Document Reviewed: 06/22/2014 Elsevier Interactive Patient Education  2017 [employer].

## 2020-09-02 NOTE — Progress Notes (Signed)
Virtual Visit via Telephone Note  I connected with  Lisa Greene on 09/02/20 at 11:45 AM EDT by telephone and verified that I am speaking with the correct person using two identifiers.  Medicare Annual Wellness visit completed telephonically due to Covid-19 pandemic.   Persons participating in this call: This Health Coach and this patient.   Location: Patient: Home Provider: Office    I discussed the limitations, risks, security and privacy concerns of performing an evaluation and management service by telephone and the availability of in person appointments. The patient expressed understanding and agreed to proceed.  Unable to perform video visit due to video visit attempted and failed and/or patient does not have video capability.   Some vital signs may be absent or patient reported.   Willette Brace, LPN    Subjective:   Lisa Greene is a 80 y.o. female who presents for Medicare Annual (Subsequent) preventive examination.  Review of Systems           Objective:    There were no vitals filed for this visit. There is no height or weight on file to calculate BMI.  Advanced Directives 03/16/2019 11/06/2016 07/30/2016 05/16/2016 04/30/2016 04/21/2016 01/20/2016  Does Patient Have a Medical Advance Directive? Yes Yes Yes No No Yes Yes  Type of Advance Directive Living will;Healthcare Power of [place] will  Does patient want to make changes to medical advance directive? No - Patient declined - - - - - -  Copy of [place] in Chart? No - copy requested - - - - - -    Current Medications (verified) Outpatient Encounter Medications as of 09/02/2020  Medication Sig  . Albuterol Sulfate (PROAIR RESPICLICK) 400 (90 Base) MCG/ACT AEPB Inhale 1 puff into the lungs every 8 (eight) hours as needed (wheezing/shortness of breath).  Marland Kitchen atorvastatin (LIPITOR) 20 MG tablet Take 1 tablet (20 mg total) by mouth daily.  Marland Kitchen azelastine (OPTIVAR) 0.05 %  ophthalmic solution   . BREO ELLIPTA 200-25 MCG/INH AEPB Inhale 1 puff into the lungs daily.  . Calcium 500-125 MG-UNIT TABS Take 1 tablet by mouth daily.  . cetirizine (ZYRTEC) 10 MG tablet Take 10 mg by mouth daily.  . cholecalciferol (VITAMIN D) 1000 UNITS tablet Take 2,000 Units by mouth daily.  . fluticasone (FLONASE) 50 MCG/ACT nasal spray Place 1 spray into both nostrils daily as needed.   . fluticasone furoate-vilanterol (BREO ELLIPTA) 100-25 MCG/INH AEPB Inhale 1 puff into the lungs daily.  . irbesartan (AVAPRO) 150 MG tablet Take 1 tablet (150 mg total) by mouth daily.  . metoprolol succinate (TOPROL-XL) 50 MG 24 hr tablet Take 1 tablet (50 mg total) by mouth daily. With or immediately following a meal. Please keep upcoming appt with Dr. Tamala Julian in January 2022. Thanks  . montelukast (SINGULAIR) 10 MG tablet Take 1 tablet by mouth at bedtime.  . [DISCONTINUED] albuterol (PROVENTIL) 90 MCG/ACT inhaler Inhale 2 puffs into the lungs every 6 (six) hours as needed for wheezing.  . [DISCONTINUED] pantoprazole (PROTONIX) 40 MG tablet Take 1 tablet by mouth daily.   No facility-administered encounter medications on file as of 09/02/2020.    Allergies (verified) Lisinopril, Latex, Penicillins, Sulfonamide derivatives, and Tape   History: Past Medical History:  Diagnosis Date  . ALLERGIC RHINITIS 01/31/2008  . Asthma   . BCC (basal cell carcinoma)sup& nod 03/01/2017   right nostril  . Breast cancer of lower-inner quadrant of left female breast ([place])  11/01/2015   treated with lumpectomy and radiation  . Cataract   . Colon cancer screening 05/21/2014   No polyps at age 58. Diverticulosis alone-no more colonoscopies.    Marland Kitchen DIVERTICULITIS, HX OF 09/07/2008   pt denies this   . Eosinophilia 02/08/2009  . Fibroid   . HYPERLIPIDEMIA 12/20/2006  . Hypertension   . OSTEOPOROSIS 12/20/2006  . PVC's (premature ventricular contractions)   . SCC (squamous cell carcinoma) well diff 11/28/2013   left  side chin  . Vitamin B 12 deficiency    Past Surgical History:  Procedure Laterality Date  . BREAST LUMPECTOMY WITH NEEDLE LOCALIZATION Left 01/03/2016   Procedure: BREAST re-excision LUMPECTOMY WITH NEEDLE LOCALIZATION;  Surgeon: Rolm Bookbinder, MD;  Location: [place] [facility];  Service: General;  Laterality: Left;  BREAST re-excision LUMPECTOMY WITH NEEDLE LOCALIZATION  . BREAST LUMPECTOMY WITH RADIOACTIVE SEED LOCALIZATION Left 11/28/2015   Procedure: LEFT BREAST LUMPECTOMY WITH BRACKETED RADIOACTIVE SEED LOCALIZATION;  Surgeon: Rolm Bookbinder, MD;  Location: [place];  Service: General;  Laterality: Left;  . BREAST SURGERY     cysts removed x2  . CATARACT EXTRACTION BILATERAL W/ ANTERIOR VITRECTOMY  2013   bilateral cataracts  . COLONOSCOPY  2005   tics only   . FOOT SURGERY    . KNEE ARTHROSCOPY     left  . NEUROMA SURGERY     x2 feet  . thyroid duct cyst     Family History  Problem Relation Age of Onset  . COPD Father   . Heart disease Father        stent placed 66  . Allergic rhinitis Father   . Brain cancer Mother   . Allergic rhinitis Maternal Grandmother   . Colon cancer Neg Hx   . Stomach cancer Neg Hx    Social History   Socioeconomic History  . Marital status: Married    Spouse name: Not on file  . Number of children: Not on file  . Years of education: Not on file  . Highest education level: Not on file  Occupational History  . Occupation: retired  Tobacco Use  . Smoking status: Never Smoker  . Smokeless tobacco: Never Used  Vaping Use  . Vaping Use: Never used  Substance and Sexual Activity  . Alcohol use: No    Alcohol/week: 0.0 standard drinks  . Drug use: No  . Sexual activity: Yes    Partners: Male    Birth control/protection: Post-menopausal  Other Topics Concern  . Not on file  Social History Narrative   Married. No kids- 2 step kids. 5 step grandkids.  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 N  Vision? N  Difficulty concentrating or making decisions? N  Walking or climbing stairs?  N  Dressing or bathing? N  Doing errands, shopping? N  Some recent data might be hidden    Patient Care Team: Marin Olp, MD as PCP - General (Family Medicine) Belva Crome, MD as PCP - Cardiology (Cardiology) Rolm Bookbinder, MD as Consulting Physician (General Surgery) Nicholas Lose, MD as Consulting Physician (Hematology and Oncology) Kyung Rudd, MD as Consulting Physician (Radiation Oncology) Katy Apo, MD as Consulting Physician (Ophthalmology) Mosetta Anis, MD as Referring Physician (Allergy)  Indicate any recent Medical Services you may have received from other than [facility] providers in the past year (date may be approximate).     Assessment:   This is a routine wellness examination for [place].  Hearing/Vision screen No exam data present  Dietary issues and exercise activities discussed:    Goals    . Exercise 150 minutes per week (moderate activity)     Stay in your garden;  Try your treadmill at home  Will go [place]; x 3 per week; in addition to garden work       Depression Screen PHQ 2/9 Scores 07/02/2020 05/11/2019 03/16/2019 11/08/2017 11/06/2016 04/21/2016 01/20/2016  PHQ - 2 Score 0 0 0 0 0 0 0  PHQ- 9 Score - 0 - - - - -    Fall Risk Fall Risk  07/02/2020 05/11/2019  03/16/2019 11/08/2017 11/06/2016  Falls in the past year? 0 0 0 Yes No  Comment - - - Tripped over a board in driveway -  Number falls in past yr: 0 0 - 1 -  Injury with Fall? 0 0 0 Yes -  Follow up - - Falls evaluation completed;Education provided;Falls prevention discussed - -    FALL RISK PREVENTION PERTAINING TO THE HOME:  Any stairs in or around the home? Yes  If so, are there any without handrails? No  Home free of loose throw rugs in walkways, pet beds, electrical cords, etc? Yes  Adequate lighting in your home to reduce risk of falls? Yes   ASSISTIVE DEVICES UTILIZED TO PREVENT FALLS:  Life alert? No  Use of a cane, walker or w/c? No  Grab bars in the bathroom? No  Shower chair or bench in shower? No  Elevated toilet seat or a handicapped toilet? No   TIMED UP AND GO:  Was the test performed? No .      Cognitive Function: MMSE - Mini Mental State Exam 11/06/2016  Not completed: (No Data)     6CIT Screen 03/16/2019  What Year? 0 points  What month? 0 points  What time? 0 points  Count back from 20 0 points  Months in reverse 0 points  Repeat phrase 0 points  Total Score 0    Immunizations Immunization History  Administered Date(s) Administered  . Fluad Quad(high Dose 65+) 04/07/2019, 05/07/2020  . Influenza Whole 03/02/2011  . Influenza, High Dose Seasonal PF 05/04/2016, 05/05/2019  . PFIZER(Purple Top)SARS-COV-2 Vaccination 06/22/2019, 07/13/2019, 03/07/2020, 05/03/2020  . Pneumococcal Conjugate-13 01/19/2007, 04/19/2014, 06/06/2015  . Pneumococcal Polysaccharide-23 01/19/2007, 10/08/2014, 11/04/2015, 05/03/2017, 05/10/2018, 05/05/2019, 05/03/2020  . Td 07/02/2002, 09/23/2017  . Tdap 08/22/2012  . Zoster 02/10/2010  . Zoster Recombinat (Shingrix) 12/28/2018    TDAP status: Up to date  Flu Vaccine status: Up to date  Pneumococcal vaccine status: Up to date  Covid-19 vaccine status: Completed vaccines  Qualifies for Shingles Vaccine? Yes    Zostavax completed Yes   Shingrix Completed?: Yes  Screening Tests Health Maintenance  Topic Date Due  . COVID-19  Vaccine (4 - Booster for Pfizer series) 11/01/2020  . MAMMOGRAM  11/22/2020  . INFLUENZA VACCINE  12/30/2020  . TETANUS/TDAP  09/24/2027  . DEXA SCAN  Completed  . PNA vac Low Risk Adult  Completed  . HPV VACCINES  Aged Out    Health Maintenance  There are no preventive care reminders to display for this patient.  Colorectal cancer screening: No longer required.   Mammogram status: Completed 11/23/19. Repeat every year  Bone Density status: Completed 07/09/20. Results reflect: Bone density results: OSTEOPENIA. Repeat every 2 years.   Additional Screening:   Vision Screening: Recommended annual ophthalmology exams for early detection of glaucoma and other disorders of the eye. Is the patient up to date with their annual eye exam?  Yes  Who is the provider or what is the name of the office in which the patient attends annual eye exams? Dr Katy Apo  If pt is not established with a provider, would they like to be referred to a provider to establish care? No .   Dental Screening: Recommended annual dental exams for proper oral hygiene  Community Resource Referral / Chronic Care Management: CRR required this visit?  No   CCM required this visit?  No      Plan:     I have personally reviewed and noted the following in the patient's chart:   . Medical and social history . Use of alcohol, tobacco or illicit drugs  . Current medications and supplements . Functional ability and status . Nutritional status . Physical activity . Advanced directives . List of other physicians . Hospitalizations, surgeries, and ER visits in previous 12 months . Vitals . Screenings to include cognitive, depression, and falls . Referrals and appointments  In addition, I have reviewed and discussed with patient certain preventive protocols, quality metrics, and best practice  recommendations. A written personalized care plan for preventive services as well as general preventive health recommendations were provided to patient.     Willette Brace, LPN   0/11/6224   Nurse Notes: None

## 2020-09-05 ENCOUNTER — Other Ambulatory Visit: Payer: Self-pay | Admitting: Interventional Cardiology

## 2020-09-05 DIAGNOSIS — I10 Essential (primary) hypertension: Secondary | ICD-10-CM

## 2020-10-10 DIAGNOSIS — N6089 Other benign mammary dysplasias of unspecified breast: Secondary | ICD-10-CM | POA: Diagnosis not present

## 2020-10-10 DIAGNOSIS — D0512 Intraductal carcinoma in situ of left breast: Secondary | ICD-10-CM | POA: Diagnosis not present

## 2020-11-06 DIAGNOSIS — Z961 Presence of intraocular lens: Secondary | ICD-10-CM | POA: Diagnosis not present

## 2020-11-06 DIAGNOSIS — H52203 Unspecified astigmatism, bilateral: Secondary | ICD-10-CM | POA: Diagnosis not present

## 2020-11-06 DIAGNOSIS — H26491 Other secondary cataract, right eye: Secondary | ICD-10-CM | POA: Diagnosis not present

## 2020-11-06 DIAGNOSIS — H40053 Ocular hypertension, bilateral: Secondary | ICD-10-CM | POA: Diagnosis not present

## 2020-11-15 ENCOUNTER — Other Ambulatory Visit: Payer: Self-pay | Admitting: General Surgery

## 2020-11-15 DIAGNOSIS — L720 Epidermal cyst: Secondary | ICD-10-CM | POA: Diagnosis not present

## 2020-12-11 DIAGNOSIS — Z1231 Encounter for screening mammogram for malignant neoplasm of breast: Secondary | ICD-10-CM | POA: Diagnosis not present

## 2020-12-11 LAB — HM MAMMOGRAPHY

## 2020-12-13 ENCOUNTER — Encounter: Payer: Self-pay | Admitting: Family Medicine

## 2020-12-31 ENCOUNTER — Ambulatory Visit (INDEPENDENT_AMBULATORY_CARE_PROVIDER_SITE_OTHER): Payer: Medicare HMO | Admitting: Family Medicine

## 2020-12-31 ENCOUNTER — Encounter: Payer: Self-pay | Admitting: Family Medicine

## 2020-12-31 ENCOUNTER — Other Ambulatory Visit: Payer: Self-pay

## 2020-12-31 VITALS — BP 130/75 | HR 58 | Temp 98.1°F | Ht 60.0 in | Wt 153.2 lb

## 2020-12-31 DIAGNOSIS — E785 Hyperlipidemia, unspecified: Secondary | ICD-10-CM | POA: Diagnosis not present

## 2020-12-31 DIAGNOSIS — I10 Essential (primary) hypertension: Secondary | ICD-10-CM

## 2020-12-31 DIAGNOSIS — E538 Deficiency of other specified B group vitamins: Secondary | ICD-10-CM

## 2020-12-31 DIAGNOSIS — J4540 Moderate persistent asthma, uncomplicated: Secondary | ICD-10-CM

## 2020-12-31 DIAGNOSIS — J309 Allergic rhinitis, unspecified: Secondary | ICD-10-CM

## 2020-12-31 DIAGNOSIS — N1832 Chronic kidney disease, stage 3b: Secondary | ICD-10-CM | POA: Diagnosis not present

## 2020-12-31 DIAGNOSIS — Z853 Personal history of malignant neoplasm of breast: Secondary | ICD-10-CM

## 2020-12-31 LAB — COMPREHENSIVE METABOLIC PANEL
ALT: 14 U/L (ref 0–35)
AST: 17 U/L (ref 0–37)
Albumin: 3.9 g/dL (ref 3.5–5.2)
Alkaline Phosphatase: 72 U/L (ref 39–117)
BUN: 16 mg/dL (ref 6–23)
CO2: 26 mEq/L (ref 19–32)
Calcium: 9.1 mg/dL (ref 8.4–10.5)
Chloride: 105 mEq/L (ref 96–112)
Creatinine, Ser: 1.24 mg/dL — ABNORMAL HIGH (ref 0.40–1.20)
GFR: 41.11 mL/min — ABNORMAL LOW (ref >60.00)
Glucose, Bld: 81 mg/dL (ref 70–99)
Potassium: 4.4 mEq/L (ref 3.5–5.1)
Sodium: 138 mEq/L (ref 135–145)
Total Bilirubin: 0.7 mg/dL (ref 0.2–1.2)
Total Protein: 6.2 g/dL (ref 6.0–8.3)

## 2020-12-31 MED ORDER — ALENDRONATE SODIUM 70 MG PO TABS: 70.0000 mg | ORAL_TABLET | ORAL | 3 refills | Status: DC

## 2020-12-31 NOTE — Progress Notes (Signed)
Phone [phone] In person visit   Subjective:   Lisa Greene is a 80 y.o. year old very pleasant female patient who presents for/with See problem oriented charting Chief Complaint  Patient presents with   Hypertension   Hyperlipidemia   This visit occurred during the SARS-CoV-2 public health emergency.  Safety protocols were in place, including screening questions prior to the visit, additional usage of staff PPE, and extensive cleaning of exam room while observing appropriate contact time as indicated for disinfecting solutions.   Past Medical History-  Patient Active Problem List   Diagnosis Date Noted   Frequent PVCs 04/14/2017    Priority: High   History of breast cancer- Breast cancer of lower-inner quadrant of left female breast 11/01/2015    Priority: High   Low vitamin B12 level 06/24/2016    Priority: Medium   CKD (chronic kidney disease), stage III ([place]) 10/22/2015    Priority: Medium   Asthma, moderate persistent, well-controlled 04/19/2014    Priority: Medium   Hypertension 11/25/2010    Priority: Medium   Hyperlipidemia 12/20/2006    Priority: Medium   Osteopenia 12/20/2006    Priority: Medium   Vasomotor rhinitis 05/10/2019    Priority: Low   Chronic allergic conjunctivitis 05/10/2019    Priority: Low   Aortic atherosclerosis ([place]) 11/23/2016    Priority: Low   Family history of abdominal aortic aneurysm 03/08/2015    Priority: Low   Eosinophilia 02/08/2009    Priority: Low   Allergic rhinitis 01/31/2008    Priority: Low    Medications- reviewed and updated Current Outpatient Medications  Medication Sig Dispense Refill   Albuterol Sulfate (PROAIR RESPICLICK) [id number] (90 Base) MCG/ACT AEPB Inhale 1 puff into the lungs every 8 (eight) hours as needed (wheezing/shortness of breath).     alendronate (FOSAMAX) 70 MG tablet Take 1 tablet (70 mg total) by mouth every 7 (seven) days. Take with a full glass of water on an empty stomach. 13 tablet 3    atorvastatin (LIPITOR) 20 MG tablet Take 1 tablet (20 mg total) by mouth daily. 90 tablet 3   azelastine (OPTIVAR) 0.05 % ophthalmic solution      BREO ELLIPTA 200-25 MCG/INH AEPB Inhale 1 puff into the lungs daily.     Calcium 500-125 MG-UNIT TABS Take 1 tablet by mouth daily.     cetirizine (ZYRTEC) 10 MG tablet Take 10 mg by mouth daily.     cholecalciferol (VITAMIN D) 1000 UNITS tablet Take 2,000 Units by mouth daily.     fluticasone (FLONASE) 50 MCG/ACT nasal spray Place 1 spray into both nostrils daily as needed.      irbesartan (AVAPRO) 150 MG tablet Take 1 tablet (150 mg total) by mouth daily. 90 tablet 3   metoprolol succinate (TOPROL-XL) 50 MG 24 hr tablet Take 1 tablet (50 mg total) by mouth daily. 90 tablet 3   montelukast (SINGULAIR) 10 MG tablet Take 1 tablet by mouth at bedtime.     No current facility-administered medications for this visit.     Objective:  BP 130/75 Comment: average of last 13 home readings.  Pulse (!) 58   Temp 98.1 F (36.7 C)   Ht 5' (1.524 m)   Wt 153 lb 4 oz (69.5 kg)   SpO2 98%   BMI 29.93 kg/m  Gen: NAD, resting comfortably CV: RRR no murmurs rubs or gallops Lungs: CTAB no crackles, wheeze, rhonchi Abdomen: soft/nontender/nondistended/normal bowel sounds.  Ext: no edema Skin: warm, dry  Assessment and Plan  #history of breast cancer- Malignant neoplasm of lower-inner quadrant of left breast in females/p lumpectomy and later re excision, estrogen receptor positive -patient now off tamoxifen due to recurrent UTI. Close follow-up Dr. Donne Hazel- 1 more year per patient. Prior saw Dr. Lindi Adie with oncology. Last mammogram 12/11/20  #hyperlipidemia #aortic atherosclerosis- LDL goal under 70 ideally S: Medication:atorvastatin 20 mg  Lab Results  Component Value Date   CHOL 148 06/28/2020   HDL 76.20 06/28/2020   LDLCALC 58 06/28/2020   LDLDIRECT 59.0 10/17/2019   TRIG 69.0 06/28/2020   CHOLHDL 2 06/28/2020  A/P: HLD-well-controlled on  last check with LDL under 70. Continue current meds Aortic atherosclerosis- at goal in light of aortic atherosclerosis with goal under 70  #hypertension with white coat element/PVCs on metoprolol S: medication: irbesartan '150mg'$ , metoprolol 50 mg XR (PVCs stable) -Edema on amlodipine and with HCTZ had mild hyponatremia Home readings #s:  home cuff previously verified. Over last 14 home readings  avg 130.1429 75.35714   BP Readings from Last 3 Encounters:  12/31/20 130/75  08/26/20 130/74  07/02/20 (!) 160/68  A/P: HTN Very well controlled at home-continue current medications-thanked her for bringing by home readings. PVCs stable- continue current meds  #Chronic kidney disease stage III S: GFR is typically in the 40s range -Patient knows to avoid NSAIDs  -hold irbesartan if gets dehydrated A/P: hopefully stable- update CMP today.    # Asthma/allergies- follows with Dr. Rush Landmark: Maintenance Medication: Memory Dance and singulair (astelin and flonase and zyrtec as needed as well for allergy portion) As needed medication: albuterol. Patient is using this 0x per week- has not used in a year A/P: both issues stable  - continue current meds and allergy follow up  # Osteoporosis S: Last DEXA: 07/09/2020 with right femoral neck T score worsening at -2.8.  MyChart message on that date. From result note "Bone density has significantly worsened down 6% at the hips-I would recommend we start medication like Fosamax potentially-on the other hand your kidney function is close to the range that limits our treatment options.  We could also refer you to [facility] endocrinology for their expert opinion-our local endocrinologists have told me they usually like to get expert opinion at [facility] for decreased kidney function so if she agrees to referral would refer her asosteoporosis and CKD stage IIIb  Calcium: '1200mg'$  (through diet ok) recommended - 600 through pill and some through diet Vitamin D: 1000 units a day  recommended- she takes this Last vitamin D- normal January 2022  A/P:  You have osteoporosis Per avs " At a minimum, recommend 800 IU of vitamin D and '1200mg'$  of Calcium per day. You can get this with a calcium-vitamin D supplement.  Once you have the above in place, I would start taking fosamax '70mg'$  once a week.  Administer first thing in the morning and >30 minutes before the first food, beverage (except plain water), or other medication of the day. Do not take with mineral water or with other beverages. Stay upright (not to lie down) for at least 30 minutes after taking medicine and until after first food of the day (to reduce irritation). Must be taken with 6 to 8 oz of plain water. The tablet should be swallowed whole; do not chew or suck."  # B12 deficiency S: Current treatment/medication (oral vs. IM):  b12 every day Lab Results  Component Value Date   [id number] 06/28/2020  A/P: reasonable control last check- update again  in 6 months   Recommended follow up: No follow-ups on file. Future Appointments  Date Time Provider [place]  09/11/2021 11:45 AM [facility]   Lab/Order associations:   ICD-10-CM   1. Hyperlipidemia, unspecified hyperlipidemia type  E78.5     2. Stage 3b chronic kidney disease (HCC)  N18.32     3. Primary hypertension  I10     4. Allergic rhinitis, unspecified seasonality, unspecified trigger  J30.9     5. Asthma, moderate persistent, well-controlled  J45.40     6. Low vitamin B12 level  E53.8     7. History of breast cancer- Breast cancer of lower-inner quadrant of left female breast  Z85.3       Meds ordered this encounter  Medications   alendronate (FOSAMAX) 70 MG tablet    Sig: Take 1 tablet (70 mg total) by mouth every 7 (seven) days. Take with a full glass of water on an empty stomach.    Dispense:  13 tablet    Refill:  3    I,Harris Phan,acting as a scribe for Garret Reddish, MD.,have documented all  relevant documentation on the behalf of Garret Reddish, MD,as directed by  Garret Reddish, MD while in the presence of Garret Reddish, MD.  I, Garret Reddish, MD, have reviewed all documentation for this visit. The documentation on 12/31/20 for the exam, diagnosis, procedures, and orders are all accurate and complete.  Return precautions advised.  Garret Reddish, MD

## 2020-12-31 NOTE — Patient Instructions (Addendum)
I am pleased to see that your blood pressure is controlled!  You have Osteoporosis At a minimum, recommend 800 IU of vitamin D and '1200mg'$  of Calcium per day. You can get this with a calcium-vitamin D supplement or you can get some through you diet.  Once you have the above in place, I would start taking fosamax '70mg'$  once a week.  Administer first thing in the morning and >30 minutes before the first food, beverage (except plain water), or other medication of the day. Do not take with mineral water or with other beverages. Stay upright (not to lie down) for at least 30 minutes after taking medicine and until after first food of the day (to reduce irritation). Must be taken with 6 to 8 oz of plain water. The tablet should be swallowed whole; do not chew or suck.  Please stop by lab before you go If you have mychart- we will send your results within 3 business days of [place] receiving them.  If you do not have mychart- we will call you about results within 5 business days of [place] receiving them.  *please also note that you will see labs on mychart as soon as they post. I will later go in and write notes on them- will say "notes from Dr. Yong Channel"  Recommended Follow-up: Return in about 6 months (around 07/03/2021) for a physical.

## 2021-01-28 ENCOUNTER — Telehealth: Payer: Self-pay

## 2021-01-28 NOTE — Telephone Encounter (Signed)
Patient called in and stated that she was no longer taking alendronate (FOSAMAX) 70 MG tablet she stated she didn't like how it made her feel and she had hot flashes and she only took it once.

## 2021-01-29 NOTE — Telephone Encounter (Signed)
FYI

## 2021-01-29 NOTE — Telephone Encounter (Signed)
Noted for future visit

## 2021-02-10 ENCOUNTER — Ambulatory Visit (INDEPENDENT_AMBULATORY_CARE_PROVIDER_SITE_OTHER): Payer: Medicare HMO | Admitting: Physician Assistant

## 2021-02-10 ENCOUNTER — Encounter: Payer: Self-pay | Admitting: Physician Assistant

## 2021-02-10 ENCOUNTER — Other Ambulatory Visit: Payer: Self-pay

## 2021-02-10 VITALS — BP 162/82 | HR 35 | Temp 97.5°F | Ht 60.0 in | Wt 157.8 lb

## 2021-02-10 DIAGNOSIS — R0989 Other specified symptoms and signs involving the circulatory and respiratory systems: Secondary | ICD-10-CM

## 2021-02-10 DIAGNOSIS — R300 Dysuria: Secondary | ICD-10-CM

## 2021-02-10 DIAGNOSIS — R5381 Other malaise: Secondary | ICD-10-CM | POA: Diagnosis not present

## 2021-02-10 LAB — COMPREHENSIVE METABOLIC PANEL
ALT: 17 U/L (ref 0–35)
AST: 20 U/L (ref 0–37)
Albumin: 3.8 g/dL (ref 3.5–5.2)
Alkaline Phosphatase: 62 U/L (ref 39–117)
BUN: 19 mg/dL (ref 6–23)
CO2: 26 mEq/L (ref 19–32)
Calcium: 9.2 mg/dL (ref 8.4–10.5)
Chloride: 103 mEq/L (ref 96–112)
Creatinine, Ser: 1.28 mg/dL — ABNORMAL HIGH (ref 0.40–1.20)
GFR: 39.55 mL/min — ABNORMAL LOW (ref >60.00)
Glucose, Bld: 91 mg/dL (ref 70–99)
Potassium: 4.1 mEq/L (ref 3.5–5.1)
Sodium: 136 mEq/L (ref 135–145)
Total Bilirubin: 0.7 mg/dL (ref 0.2–1.2)
Total Protein: 6.3 g/dL (ref 6.0–8.3)

## 2021-02-10 LAB — CBC WITH DIFFERENTIAL/PLATELET
Basophils Absolute: 0.1 10*3/uL (ref 0.0–0.1)
Basophils Relative: 0.9 % (ref 0.0–3.0)
Eosinophils Absolute: 0.4 10*3/uL (ref 0.0–0.7)
Eosinophils Relative: 5.8 % — ABNORMAL HIGH (ref 0.0–5.0)
HCT: 37.7 % (ref 36.0–46.0)
Hemoglobin: 12.7 g/dL (ref 12.0–15.0)
Lymphocytes Relative: 19.5 % (ref 12.0–46.0)
Lymphs Abs: 1.2 10*3/uL (ref 0.7–4.0)
MCHC: 33.8 g/dL (ref 30.0–36.0)
MCV: 91.3 fl (ref 78.0–100.0)
Monocytes Absolute: 0.6 10*3/uL (ref 0.1–1.0)
Monocytes Relative: 9.4 % (ref 3.0–12.0)
Neutro Abs: 4.1 10*3/uL (ref 1.4–7.7)
Neutrophils Relative %: 64.4 % (ref 43.0–77.0)
Platelets: 278 10*3/uL (ref 150.0–400.0)
RBC: 4.13 Mil/uL (ref 3.87–5.11)
RDW: 13.6 % (ref 11.5–15.5)
WBC: 6.3 10*3/uL (ref 4.0–10.5)

## 2021-02-10 LAB — POCT URINALYSIS DIPSTICK
Bilirubin, UA: NEGATIVE
Blood, UA: POSITIVE
Glucose, UA: NEGATIVE
Ketones, UA: NEGATIVE
Nitrite, UA: NEGATIVE
Protein, UA: POSITIVE — AB
Spec Grav, UA: 1.01 (ref 1.010–1.025)
Urobilinogen, UA: 0.2 E.U./dL
pH, UA: 6 (ref 5.0–8.0)

## 2021-02-10 MED ORDER — CEPHALEXIN 500 MG PO CAPS: 500.0000 mg | ORAL_CAPSULE | Freq: Two times a day (BID) | ORAL | 0 refills | Status: AC

## 2021-02-10 NOTE — Progress Notes (Signed)
Lisa Greene is a 80 y.o. female here for a new problem.  History of Present Illness:   Chief Complaint  Patient presents with   Pelvic Pain    Starting 1 week ago. She states having a feeling a nausea, and sharp pain when voiding.   Back Pain    Lower   Nasal Congestion    With mild chest pressure.    HPI  UTI symptoms Intermittent lower abdominal pain and nausea, and dysuria x 1 week. On Thursday of last week symptoms worsened.  She had a sharp abdominal pain in LLQ and some lower back pain achiness. Sharp abdomina pain has improved. Drinking plenty of water. Denies: v/d/c, rectal bleeding, hematuria. She is concerned that she may have passed a kidney stone when she had that sharp abdominal pain.  Recent travel to [facility]  Chest  congestion For x 1 week has had chest congestion. Intermittent. Does not get worse with activity. Has tried zyrtec daily and tylenol. Uses breo ellipta regularly.Has not had to use rescue inhaler at all. Denies SOB or any exertional chest pain. Was around her brother with recent URI. Has had COVID in the past. Denies fever/chills.    Past Medical History:  Diagnosis Date   ALLERGIC RHINITIS 01/31/2008   Asthma    BCC (basal cell carcinoma)sup& nod 03/01/2017   right nostril   Breast cancer of lower-inner quadrant of left female breast ([place]) 11/01/2015   treated with lumpectomy and radiation   Cataract    Colon cancer screening 05/21/2014   No polyps at age 36. Diverticulosis alone-no more colonoscopies.     DIVERTICULITIS, HX OF 09/07/2008   pt denies this    Eosinophilia 02/08/2009   Fibroid    HYPERLIPIDEMIA 12/20/2006   Hypertension    OSTEOPOROSIS 12/20/2006   PVC's (premature ventricular contractions)    SCC (squamous cell carcinoma) well diff 11/28/2013   left side chin   Vitamin B 12 deficiency      Social History   Tobacco Use   Smoking status: Never   Smokeless tobacco: Never  Vaping Use   Vaping Use: Never used  Substance Use  Topics   Alcohol use: No    Alcohol/week: 0.0 standard drinks   Drug use: No    Past Surgical History:  Procedure Laterality Date   BREAST LUMPECTOMY WITH NEEDLE LOCALIZATION Left 01/03/2016   Procedure: BREAST re-excision LUMPECTOMY WITH NEEDLE LOCALIZATION;  Surgeon: Rolm Bookbinder, MD;  Location: [facility];  Service: General;  Laterality: Left;  BREAST re-excision LUMPECTOMY WITH NEEDLE LOCALIZATION   BREAST LUMPECTOMY WITH RADIOACTIVE SEED LOCALIZATION Left 11/28/2015   Procedure: LEFT BREAST LUMPECTOMY WITH BRACKETED RADIOACTIVE SEED LOCALIZATION;  Surgeon: Rolm Bookbinder, MD;  Location: [facility];  Service: General;  Laterality: Left;   BREAST SURGERY     cysts removed x2   CATARACT EXTRACTION BILATERAL W/ ANTERIOR VITRECTOMY  2013   bilateral cataracts   COLONOSCOPY  2005   tics only    FOOT SURGERY     KNEE ARTHROSCOPY     left   NEUROMA SURGERY     x2 feet   thyroid duct cyst      Family History  Problem Relation Age of Onset   COPD Father    Heart disease Father        stent placed 80   Allergic rhinitis Father    Brain cancer Mother    Allergic rhinitis Maternal Grandmother    Colon cancer Neg  Hx    Stomach cancer Neg Hx     Allergies  Allergen Reactions   Lisinopril Other (See Comments) and Cough    Dry cough and stinging rash   Latex Rash   Penicillins Rash    Has patient had a PCN reaction causing immediate rash, facial/tongue/throat swelling, SOB or lightheadedness with hypotension: Yes Has patient had a PCN reaction causing severe rash involving mucus membranes or skin necrosis: No Has patient had a PCN reaction that required hospitalization No Has patient had a PCN reaction occurring within the last 10 years: No If all of the above answers are "NO", then may proceed with Cephalosporin use.    Sulfonamide Derivatives Rash   Tape Rash    Band-aids break out skin!!    Current Medications:   Current Outpatient  Medications:    Albuterol Sulfate (PROAIR RESPICLICK) [id number] (90 Base) MCG/ACT AEPB, Inhale 1 puff into the lungs every 8 (eight) hours as needed (wheezing/shortness of breath)., Disp: , Rfl:    alendronate (FOSAMAX) 70 MG tablet, Take 1 tablet (70 mg total) by mouth every 7 (seven) days. Take with a full glass of water on an empty stomach., Disp: 13 tablet, Rfl: 3   atorvastatin (LIPITOR) 20 MG tablet, Take 1 tablet (20 mg total) by mouth daily., Disp: 90 tablet, Rfl: 3   azelastine (OPTIVAR) 0.05 % ophthalmic solution, , Disp: , Rfl:    BREO ELLIPTA 200-25 MCG/INH AEPB, Inhale 1 puff into the lungs daily., Disp: , Rfl:    Calcium 500-125 MG-UNIT TABS, Take 1 tablet by mouth daily., Disp: , Rfl:    cephALEXin (KEFLEX) 500 MG capsule, Take 1 capsule (500 mg total) by mouth 2 (two) times daily for 7 days., Disp: 14 capsule, Rfl: 0   cetirizine (ZYRTEC) 10 MG tablet, Take 10 mg by mouth daily., Disp: , Rfl:    cholecalciferol (VITAMIN D) 1000 UNITS tablet, Take 2,000 Units by mouth daily., Disp: , Rfl:    fluticasone (FLONASE) 50 MCG/ACT nasal spray, Place 1 spray into both nostrils daily as needed. , Disp: , Rfl:    irbesartan (AVAPRO) 150 MG tablet, Take 1 tablet (150 mg total) by mouth daily., Disp: 90 tablet, Rfl: 3   metoprolol succinate (TOPROL-XL) 50 MG 24 hr tablet, Take 1 tablet (50 mg total) by mouth daily., Disp: 90 tablet, Rfl: 3   montelukast (SINGULAIR) 10 MG tablet, Take 1 tablet by mouth at bedtime., Disp: , Rfl:    Review of Systems:   ROS  Vitals:   Vitals:   02/10/21 0933  BP: (!) 162/82  Pulse: (!) 35  Temp: (!) 97.5 F (36.4 C)  TempSrc: Temporal  SpO2: 99%  Weight: 157 lb 12.8 oz (71.6 kg)  Height: 5' (1.524 m)     Body mass index is 30.82 kg/m.  Physical Exam:   Physical Exam Vitals and nursing note reviewed.  Constitutional:      General: She is not in acute distress.    Appearance: She is well-developed. She is not ill-appearing or toxic-appearing.  HENT:      Head: Normocephalic and atraumatic.     Right Ear: Tympanic membrane, ear canal and external ear normal. Tympanic membrane is not erythematous, retracted or bulging.     Left Ear: Tympanic membrane, ear canal and external ear normal. Tympanic membrane is not erythematous, retracted or bulging.     Nose: Nose normal.     Right Sinus: No maxillary sinus tenderness or frontal sinus tenderness.  Left Sinus: No maxillary sinus tenderness or frontal sinus tenderness.     Mouth/Throat:     Pharynx: Uvula midline. No posterior oropharyngeal erythema.  Eyes:     General: Lids are normal.     Conjunctiva/sclera: Conjunctivae normal.  Neck:     Trachea: Trachea normal.  Cardiovascular:     Rate and Rhythm: Normal rate and regular rhythm.     Heart sounds: Normal heart sounds, S1 normal and S2 normal.  Pulmonary:     Effort: Pulmonary effort is normal.     Breath sounds: Normal breath sounds. No decreased breath sounds, wheezing, rhonchi or rales.  Abdominal:     General: Bowel sounds are normal.     Palpations: Abdomen is soft.     Tenderness: There is no abdominal tenderness. There is no right CVA tenderness or left CVA tenderness.  Lymphadenopathy:     Cervical: No cervical adenopathy.  Skin:    General: Skin is warm and dry.  Neurological:     Mental Status: She is alert.  Psychiatric:        Speech: Speech normal.        Behavior: Behavior normal. Behavior is cooperative.   Results for orders placed or performed in visit on 02/10/21  POCT Urinalysis Dipstick  Result Value Ref Range   Color, UA yellow    Clarity, UA lil cloudy    Glucose, UA Negative Negative   Bilirubin, UA neg    Ketones, UA neg    Spec Grav, UA 1.010 1.010 - 1.025   Blood, UA positive    pH, UA 6.0 5.0 - 8.0   Protein, UA Positive (A) Negative   Urobilinogen, UA 0.2 0.2 or 1.0 E.U./dL   Nitrite, UA neg    Leukocytes, UA Large (3+) (A) Negative   Appearance     Odor       Assessment and Plan:    1. Dysuria UA and sx concerning for cystitis Will treat with keflex 500 mg BID Culture pending If no improvement in a few days or any worsening, she was instructed to come back and let us know  2. Malaise Possible viral illness from recent travel Update blood work today -- CMP and CBC If any new or unresolved symptoms, follow-up in our office Continue to push fluids and eat regular meals  3. Chest congestion Possible early or resolving mild URI Vitals stable Continue regular inhaler use and follow-up if any new/worsening sx  Inda Coke, PA-C

## 2021-02-10 NOTE — Patient Instructions (Signed)
It was great to see you!  Start oral keflex antibiotic 500 mg twice daily If you develop any concerns for allergic reaction, stop immediately and go to the ER  We will update your blood work today  General instructions Make sure you: Pee until your bladder is empty. Do not hold pee for a long time. Empty your bladder after sex. Wipe from front to back after pooping if you are a female. Use each tissue one time when you wipe. Drink enough fluid to keep your pee pale yellow. Keep all follow-up visits as told by your doctor. This is important. Contact a doctor if: You do not get better after 1-2 days. Your symptoms go away and then come back. Get help right away if: You have very bad back pain. You have very bad pain in your lower belly. You have a fever. You are sick to your stomach (nauseous). You are throwing up.   Take care,  Inda Coke PA-C

## 2021-02-10 NOTE — Addendum Note (Signed)
Addended by: Loura Back on: 02/10/2021 10:19 AM   Modules accepted: Orders

## 2021-02-12 ENCOUNTER — Other Ambulatory Visit: Payer: Self-pay | Admitting: Physician Assistant

## 2021-02-12 ENCOUNTER — Other Ambulatory Visit: Payer: Self-pay | Admitting: *Deleted

## 2021-02-12 DIAGNOSIS — N3001 Acute cystitis with hematuria: Secondary | ICD-10-CM

## 2021-02-12 DIAGNOSIS — R319 Hematuria, unspecified: Secondary | ICD-10-CM

## 2021-02-12 LAB — URINE CULTURE
MICRO NUMBER:: 12360452
SPECIMEN QUALITY:: ADEQUATE

## 2021-02-12 MED ORDER — CIPROFLOXACIN HCL 250 MG PO TABS: 250.0000 mg | ORAL_TABLET | Freq: Two times a day (BID) | ORAL | 0 refills | Status: AC

## 2021-02-23 ENCOUNTER — Encounter: Payer: Self-pay | Admitting: Family Medicine

## 2021-02-24 ENCOUNTER — Encounter: Payer: Self-pay | Admitting: Family Medicine

## 2021-02-24 ENCOUNTER — Telehealth: Payer: Self-pay

## 2021-02-24 NOTE — Telephone Encounter (Signed)
I called to give pt Dr. Ronney Lion message regarding her husbands COVID medicine being sent in. Pt states that she has it too and was also wanting something to be sent in for her. Please advise.

## 2021-02-24 NOTE — Telephone Encounter (Signed)
Patient stated she is feeling bad now and has a sore throat. Patient stated that Dr. Yong Channel told her he would send something in for her. Please advise.

## 2021-02-24 NOTE — Telephone Encounter (Signed)
Pt called back to check and see if she can get medication. Please Advise

## 2021-02-24 NOTE — Telephone Encounter (Signed)
Unfortunately this is not enough information to be able to determine prescription.  We can either set her up for a virtual visit-I am willing to work her in or I will need information about first date of symptoms and when she tested positive

## 2021-02-25 ENCOUNTER — Other Ambulatory Visit: Payer: Medicare HMO

## 2021-02-25 ENCOUNTER — Encounter: Payer: Self-pay | Admitting: Nurse Practitioner

## 2021-02-25 ENCOUNTER — Telehealth (INDEPENDENT_AMBULATORY_CARE_PROVIDER_SITE_OTHER): Payer: Medicare HMO | Admitting: Nurse Practitioner

## 2021-02-25 VITALS — HR 97 | Temp 97.7°F | Wt 150.0 lb

## 2021-02-25 DIAGNOSIS — U071 COVID-19: Secondary | ICD-10-CM | POA: Insufficient documentation

## 2021-02-25 MED ORDER — MOLNUPIRAVIR EUA 200MG CAPSULE: 4.0000 | ORAL_CAPSULE | Freq: Two times a day (BID) | ORAL | 0 refills | Status: AC

## 2021-02-25 NOTE — Telephone Encounter (Signed)
Please schedule pt for virtual.  

## 2021-02-25 NOTE — Telephone Encounter (Signed)
Patient is scheduled for an appointment today with [facility].

## 2021-02-25 NOTE — Assessment & Plan Note (Signed)
Patient tested positive for COVID 19 at home.  Has been vaccinated with [employer] x2 and 1 booster.  Sick contact with her husband who tested earlier in the week on Thursday.  Given patient age and risk factors will treat with antiviral medication.  Discussed both with patient.  Given medication regimen will opt for Molnupiravir.  Did discuss that this medication is emergency use authorized only did discuss the common side effects.  Did discuss duration of medication and purpose of medication.  Patient given signs and symptoms as when to seek urgent or emergent health care.  Did discuss with patient monitoring her O2 sat at home.  She acknowledged.

## 2021-02-25 NOTE — Progress Notes (Signed)
Patient ID: Lisa Greene, female    DOB: June 17, 1940, 80 y.o.   MRN: [id number]  Virtual visit completed through [employer], a video enabled telemedicine application. Due to national recommendations of social distancing due to COVID-19, a virtual visit is felt to be most appropriate for this patient at this time. Reviewed limitations, risks, security and privacy concerns of performing a virtual visit and the availability of in person appointments. I also reviewed that there may be a patient responsible charge related to this service. The patient agreed to proceed.   Patient location: home Provider location: [facility], office Persons participating in this virtual visit: patient, provider   If any vitals were documented, they were collected by patient at home unless specified below.    Pulse 97   Temp 97.7 F (36.5 C)   Wt 150 lb (68 kg)   BMI 29.29 kg/m    CC: Covid 19 Subjective:   HPI: Lisa Greene is a 80 y.o. female presenting on 02/25/2021 for Covid Positive (On 02/22/21. Sx started on 02/22/21. Deep cough, sore throat, "not feeling good," earache yesterday.)  Symptoms started 02/22/21. At home test positive 02/22/2021 Covid vaccine x 2 with one booster. Tylenol OTC helps minimally. States that her husband was dx with covid earlier in the week.    Relevant past medical, surgical, family and social history reviewed and updated as indicated. Interim medical history since our last visit reviewed. Allergies and medications reviewed and updated. Outpatient Medications Prior to Visit  Medication Sig Dispense Refill   Albuterol Sulfate (PROAIR RESPICLICK) 353 (90 Base) MCG/ACT AEPB Inhale 1 puff into the lungs every 8 (eight) hours as needed (wheezing/shortness of breath).     alendronate (FOSAMAX) 70 MG tablet Take 1 tablet (70 mg total) by mouth every 7 (seven) days. Take with a full glass of water on an empty stomach. 13 tablet 3   atorvastatin (LIPITOR) 20 MG  tablet Take 1 tablet (20 mg total) by mouth daily. 90 tablet 3   azelastine (OPTIVAR) 0.05 % ophthalmic solution      BREO ELLIPTA 200-25 MCG/INH AEPB Inhale 1 puff into the lungs daily.     Calcium 500-125 MG-UNIT TABS Take 1 tablet by mouth daily.     cetirizine (ZYRTEC) 10 MG tablet Take 10 mg by mouth daily.     cholecalciferol (VITAMIN D) 1000 UNITS tablet Take 2,000 Units by mouth daily.     fluticasone (FLONASE) 50 MCG/ACT nasal spray Place 1 spray into both nostrils daily as needed.      irbesartan (AVAPRO) 150 MG tablet Take 1 tablet (150 mg total) by mouth daily. 90 tablet 3   metoprolol succinate (TOPROL-XL) 50 MG 24 hr tablet Take 1 tablet (50 mg total) by mouth daily. 90 tablet 3   montelukast (SINGULAIR) 10 MG tablet Take 1 tablet by mouth at bedtime.     No facility-administered medications prior to visit.     Per HPI unless specifically indicated in ROS section below Review of Systems  Constitutional:  Positive for fatigue. Negative for chills and fever.  HENT:  Positive for congestion and sore throat. Negative for ear pain.   Respiratory:  Positive for cough (clear). Negative for shortness of breath.   Cardiovascular:  Negative for chest pain.  Gastrointestinal:  Negative for diarrhea, nausea and vomiting.  Neurological:  Negative for headaches.  Objective:  Pulse 97   Temp 97.7 F (36.5 C)   Wt 150 lb (68 kg)  BMI 29.29 kg/m   Wt Readings from Last 3 Encounters:  02/25/21 150 lb (68 kg)  02/10/21 157 lb 12.8 oz (71.6 kg)  12/31/20 153 lb 4 oz (69.5 kg)       Physical exam: Gen: alert, NAD, not ill appearing Pulm: speaks in complete sentences without increased work of breathing Psych: normal mood, normal thought content      Results for orders placed or performed in visit on 02/10/21  Urine Culture   Specimen: Urine  Result Value Ref Range   MICRO NUMBER: [id number]    SPECIMEN QUALITY: Adequate    Sample Source NOT GIVEN    STATUS: FINAL    ISOLATE  1: Escherichia coli (A)       Susceptibility   Escherichia coli - URINE CULTURE, REFLEX    AMOX/CLAVULANIC >=32 Resistant     AMPICILLIN >=32 Resistant     AMPICILLIN/SULBACTAM >=32 Resistant     CEFAZOLIN* >=64 Resistant      * For uncomplicated UTI caused by E. coli, K. pneumoniae or P. mirabilis: Cefazolin is susceptible if MIC <32 mcg/mL and predicts susceptible to the oral agents cefaclor, cefdinir, cefpodoxime, cefprozil, cefuroxime, cephalexin and loracarbef.     CEFTAZIDIME <=1 Sensitive     CEFEPIME <=1 Sensitive     CEFTRIAXONE <=1 Sensitive     CIPROFLOXACIN <=0.25 Sensitive     LEVOFLOXACIN <=0.12 Sensitive     GENTAMICIN <=1 Sensitive     IMIPENEM <=0.25 Sensitive     NITROFURANTOIN <=16 Sensitive     PIP/TAZO 8 Sensitive     TOBRAMYCIN <=1 Sensitive     TRIMETH/SULFA* <=20 Sensitive      * For uncomplicated UTI caused by E. coli, K. pneumoniae or P. mirabilis: Cefazolin is susceptible if MIC <32 mcg/mL and predicts susceptible to the oral agents cefaclor, cefdinir, cefpodoxime, cefprozil, cefuroxime, cephalexin and loracarbef. Legend: S = Susceptible  I = Intermediate R = Resistant  NS = Not susceptible * = Not tested  NR = Not reported **NN = See antimicrobic comments   CBC with Differential/Platelet  Result Value Ref Range   WBC 6.3 4.0 - 10.5 K/uL   RBC 4.13 3.87 - 5.11 Mil/uL   Hemoglobin 12.7 12.0 - 15.0 g/dL   HCT 37.7 36.0 - 46.0 %   MCV 91.3 78.0 - 100.0 fl   MCHC 33.8 30.0 - 36.0 g/dL   RDW 13.6 11.5 - 15.5 %   Platelets 278.0 150.0 - 400.0 K/uL   Neutrophils Relative % 64.4 43.0 - 77.0 %   Lymphocytes Relative 19.5 12.0 - 46.0 %   Monocytes Relative 9.4 3.0 - 12.0 %   Eosinophils Relative 5.8 (H) 0.0 - 5.0 %   Basophils Relative 0.9 0.0 - 3.0 %   Neutro Abs 4.1 1.4 - 7.7 K/uL   Lymphs Abs 1.2 0.7 - 4.0 K/uL   Monocytes Absolute 0.6 0.1 - 1.0 K/uL   Eosinophils Absolute 0.4 0.0 - 0.7 K/uL   Basophils Absolute 0.1 0.0 - 0.1 K/uL   Comprehensive metabolic panel  Result Value Ref Range   Sodium 136 135 - 145 mEq/L   Potassium 4.1 3.5 - 5.1 mEq/L   [place]ide 103 96 - 112 mEq/L   CO2 26 19 - 32 mEq/L   Glucose, Bld 91 70 - 99 mg/dL   BUN 19 6 - 23 mg/dL   Creatinine, Ser 1.28 (H) 0.40 - 1.20 mg/dL   Total Bilirubin 0.7 0.2 - 1.2 mg/dL   Alkaline Phosphatase 62  39 - 117 U/L   AST 20 0 - 37 U/L   ALT 17 0 - 35 U/L   Total Protein 6.3 6.0 - 8.3 g/dL   Albumin 3.8 3.5 - 5.2 g/dL   GFR 39.55 (L) >60.00 mL/min   Calcium 9.2 8.4 - 10.5 mg/dL  POCT Urinalysis Dipstick  Result Value Ref Range   Color, UA yellow    Clarity, UA lil cloudy    Glucose, UA Negative Negative   Bilirubin, UA neg    Ketones, UA neg    Spec Grav, UA 1.010 1.010 - 1.025   Blood, UA positive    pH, UA 6.0 5.0 - 8.0   Protein, UA Positive (A) Negative   Urobilinogen, UA 0.2 0.2 or 1.0 E.U./dL   Nitrite, UA neg    Leukocytes, UA Large (3+) (A) Negative   Appearance     Odor     Assessment & Plan:   Problem List Items Addressed This Visit       Other   COVID-19 - Primary    Patient tested positive for COVID 19 at home.  Has been vaccinated with [employer] x2 and 1 booster.  Sick contact with her husband who tested earlier in the week on Thursday.  Given patient age and risk factors will treat with antiviral medication.  Discussed both with patient.  Given medication regimen will opt for Molnupiravir.  Did discuss that this medication is emergency use authorized only did discuss the common side effects.  Did discuss duration of medication and purpose of medication.  Patient given signs and symptoms as when to seek urgent or emergent health care.  Did discuss with patient monitoring her O2 sat at home.  She acknowledged.      Relevant Medications   molnupiravir EUA (LAGEVRIO) 200 mg CAPS capsule     No orders of the defined types were placed in this encounter.  No orders of the defined types were placed in this encounter.   I discussed  the assessment and treatment plan with the patient. The patient was provided an opportunity to ask questions and all were answered. The patient agreed with the plan and demonstrated an understanding of the instructions. The patient was advised to call back or seek an in-person evaluation if the symptoms worsen or if the condition fails to improve as anticipated.  Follow up plan: No follow-ups on file.  This visit occurred during the SARS-CoV-2 public health emergency.  Safety protocols were in place, including screening questions prior to the visit, additional usage of staff PPE, and extensive cleaning of exam room while observing appropriate contact time as indicated for disinfecting solutions.   Romilda Garret, NP

## 2021-02-26 ENCOUNTER — Other Ambulatory Visit: Payer: Medicare HMO

## 2021-02-27 MED ORDER — BENZONATATE 100 MG PO CAPS: 100.0000 mg | ORAL_CAPSULE | Freq: Two times a day (BID) | ORAL | 0 refills | Status: DC | PRN

## 2021-02-27 MED ORDER — IPRATROPIUM BROMIDE 0.06 % NA SOLN: 2.0000 | Freq: Four times a day (QID) | NASAL | 0 refills | Status: DC

## 2021-02-27 NOTE — Addendum Note (Signed)
Addended by: Marin Olp on: 02/27/2021 07:27 PM   Modules accepted: Orders

## 2021-03-05 ENCOUNTER — Other Ambulatory Visit (INDEPENDENT_AMBULATORY_CARE_PROVIDER_SITE_OTHER): Payer: Medicare HMO

## 2021-03-05 ENCOUNTER — Other Ambulatory Visit: Payer: Self-pay

## 2021-03-05 DIAGNOSIS — N3001 Acute cystitis with hematuria: Secondary | ICD-10-CM | POA: Diagnosis not present

## 2021-03-05 DIAGNOSIS — R319 Hematuria, unspecified: Secondary | ICD-10-CM

## 2021-03-05 LAB — URINALYSIS, ROUTINE W REFLEX MICROSCOPIC
Bilirubin Urine: NEGATIVE
Hgb urine dipstick: NEGATIVE
Ketones, ur: NEGATIVE
Nitrite: NEGATIVE
Specific Gravity, Urine: 1.01 (ref 1.000–1.030)
Total Protein, Urine: NEGATIVE
Urine Glucose: NEGATIVE
Urobilinogen, UA: 0.2 (ref 0.0–1.0)
pH: 6 (ref 5.0–8.0)

## 2021-03-06 LAB — URINE CULTURE
MICRO NUMBER:: 12464424
SPECIMEN QUALITY:: ADEQUATE

## 2021-05-06 DIAGNOSIS — J4540 Moderate persistent asthma, uncomplicated: Secondary | ICD-10-CM | POA: Diagnosis not present

## 2021-05-06 DIAGNOSIS — J300 Vasomotor rhinitis: Secondary | ICD-10-CM | POA: Diagnosis not present

## 2021-05-06 DIAGNOSIS — H1045 Other chronic allergic conjunctivitis: Secondary | ICD-10-CM | POA: Diagnosis not present

## 2021-05-22 ENCOUNTER — Other Ambulatory Visit: Payer: Self-pay

## 2021-05-22 ENCOUNTER — Ambulatory Visit: Payer: Medicare HMO | Admitting: Orthopaedic Surgery

## 2021-05-22 ENCOUNTER — Ambulatory Visit (INDEPENDENT_AMBULATORY_CARE_PROVIDER_SITE_OTHER): Payer: Medicare HMO

## 2021-05-22 ENCOUNTER — Encounter: Payer: Self-pay | Admitting: Orthopaedic Surgery

## 2021-05-22 DIAGNOSIS — M25572 Pain in left ankle and joints of left foot: Secondary | ICD-10-CM

## 2021-05-22 NOTE — Progress Notes (Signed)
Office Visit Note   Patient: Lisa Greene           Date of Birth: 1941-04-27           MRN: [id number] Visit Date: 05/22/2021              Requested by: Marin Olp, [address],  [place] [address] PCP: Marin Olp, MD  Chief Complaint  Patient presents with   Left Ankle - Pain, Edema      HPI: Patient is a pleasant 80 year old woman with a 6-week history of lateral left ankle pain.  She reports that she hit her lateral ankle on a metal box.  She did not have any open wound.  She does acknowledge that she probably rolled the ankle a little bit.  Since that she since that time she has had swelling focally over the lateral side of the ankle.  No instability.  She does report pain after she has been walking a while and sits down and occasionally at night.  No paresthesias or radiation of pain into her forefoot  Assessment & Plan: Visit Diagnoses:  1. Pain in left ankle and joints of left foot     Plan: Findings consistent with ATFL sprain.  Discussed the natural history of this.  We will give her an ankle support to wear for the next few weeks.  Follow-up if no improvement  Follow-Up Instructions: No follow-ups on file.   Ortho Exam  Patient is alert, oriented, no adenopathy, well-dressed, normal affect, normal respiratory effort. Examination distal pulses are intact she has no swelling no effusion no tenderness over the medial joint line no tenderness with movement of subtalar joint.  Good strength with plantarflexion dorsiflexion inversion eversion.  She does have tenderness to palpation over the ATFL.  No tenderness over the distal fibula no tenderness over the distal tibia  Imaging: XR Ankle Complete Left  Result Date: 05/22/2021 Radiographs of her left ankle were reviewed today well-maintained alignment through the mortise.  No evidence of any osteochondral lesion is no evidence of any cortical disruptions or fractures.  No loose bodies.  She  does have calcaneal spurs on the plantar surface consistent with previous plantar fasciitis and Haglund's deformity  No images are attached to the encounter.  Labs: Lab Results  Component Value Date   LABORGA NO GROWTH 11/10/2016     Lab Results  Component Value Date   ALBUMIN 3.8 02/10/2021   ALBUMIN 3.9 12/31/2020   ALBUMIN 3.8 06/28/2020    No results found for: MG Lab Results  Component Value Date   VD25OH 40.78 06/28/2020   VD25OH 39.00 05/11/2019    No results found for: PREALBUMIN CBC EXTENDED Latest Ref Rng & Units 02/10/2021 06/28/2020 03/28/2020  WBC 4.0 - 10.5 K/uL 6.3 5.4 6.8  RBC 3.87 - 5.11 Mil/uL 4.13 4.11 4.22  HGB 12.0 - 15.0 g/dL 12.7 12.7 13.1  HCT 36.0 - 46.0 % 37.7 37.2 38.8  PLT 150.0 - 400.0 K/uL 278.0 230.0 286  NEUTROABS 1.4 - 7.7 K/uL 4.1 2.5 4,216  LYMPHSABS 0.7 - 4.0 K/uL 1.2 1.9 1,618     There is no height or weight on file to calculate BMI.  Orders:  Orders Placed This Encounter  Procedures   XR Ankle Complete Left   No orders of the defined types were placed in this encounter.    Procedures: No procedures performed  Clinical Data: No additional findings.  ROS:  All other  systems negative, except as noted in the HPI. Review of Systems  Objective: Vital Signs: There were no vitals taken for this visit.  Specialty Comments:  No specialty comments available.  PMFS History: Patient Active Problem List   Diagnosis Date Noted   COVID-19 02/25/2021   Vasomotor rhinitis 05/10/2019   Chronic allergic conjunctivitis 05/10/2019   Frequent PVCs 04/14/2017   Aortic atherosclerosis ([place]) 11/23/2016   Low vitamin B12 level 06/24/2016   History of breast cancer- Breast cancer of lower-inner quadrant of left female breast 11/01/2015   CKD (chronic kidney disease), stage III ([facility]) 10/22/2015   Family history of abdominal aortic aneurysm 03/08/2015   Asthma, moderate persistent, well-controlled 04/19/2014   Hypertension 11/25/2010    Eosinophilia 02/08/2009   Allergic rhinitis 01/31/2008   Hyperlipidemia 12/20/2006   Osteopenia 12/20/2006   Past Medical History:  Diagnosis Date   ALLERGIC RHINITIS 01/31/2008   Asthma    BCC (basal cell carcinoma)sup& nod 03/01/2017   right nostril   Breast cancer of lower-inner quadrant of left female breast ([place] [facility]) 11/01/2015   treated with lumpectomy and radiation   Cataract    Colon cancer screening 05/21/2014   No polyps at age 89. Diverticulosis alone-no more colonoscopies.     DIVERTICULITIS, HX OF 09/07/2008   pt denies this    Eosinophilia 02/08/2009   Fibroid    HYPERLIPIDEMIA 12/20/2006   Hypertension    OSTEOPOROSIS 12/20/2006   PVC's (premature ventricular contractions)    SCC (squamous cell carcinoma) well diff 11/28/2013   left side chin   Vitamin B 12 deficiency     Family History  Problem Relation Age of Onset   COPD Father    Heart disease Father        stent placed 73   Allergic rhinitis Father    Brain cancer Mother    Allergic rhinitis Maternal Grandmother    Colon cancer Neg Hx    Stomach cancer Neg Hx     Past Surgical History:  Procedure Laterality Date   BREAST LUMPECTOMY WITH NEEDLE LOCALIZATION Left 01/03/2016   Procedure: BREAST re-excision LUMPECTOMY WITH NEEDLE LOCALIZATION;  Surgeon: Rolm Bookbinder, MD;  Location: [facility];  Service: General;  Laterality: Left;  BREAST re-excision LUMPECTOMY WITH NEEDLE LOCALIZATION   BREAST LUMPECTOMY WITH RADIOACTIVE SEED LOCALIZATION Left 11/28/2015   Procedure: LEFT BREAST LUMPECTOMY WITH BRACKETED RADIOACTIVE SEED LOCALIZATION;  Surgeon: Rolm Bookbinder, MD;  Location: [facility];  Service: General;  Laterality: Left;   BREAST SURGERY     cysts removed x2   CATARACT EXTRACTION BILATERAL W/ ANTERIOR VITRECTOMY  2013   bilateral cataracts   COLONOSCOPY  2005   tics only    FOOT SURGERY     KNEE ARTHROSCOPY     left   NEUROMA SURGERY     x2 feet   thyroid duct cyst      Social History   Occupational History   Occupation: retired  Tobacco Use   Smoking status: Never   Smokeless tobacco: Never  Vaping Use   Vaping Use: Never used  Substance and Sexual Activity   Alcohol use: No    Alcohol/week: 0.0 standard drinks   Drug use: No   Sexual activity: Yes    Partners: Male    Birth control/protection: Post-menopausal

## 2021-06-10 ENCOUNTER — Ambulatory Visit: Payer: Medicare HMO | Admitting: Physician Assistant

## 2021-06-18 ENCOUNTER — Encounter: Payer: Self-pay | Admitting: Orthopaedic Surgery

## 2021-06-18 ENCOUNTER — Other Ambulatory Visit: Payer: Self-pay

## 2021-06-18 ENCOUNTER — Ambulatory Visit: Payer: Medicare HMO | Admitting: Orthopaedic Surgery

## 2021-06-18 DIAGNOSIS — M25572 Pain in left ankle and joints of left foot: Secondary | ICD-10-CM | POA: Diagnosis not present

## 2021-06-18 NOTE — Progress Notes (Signed)
Office Visit Note   Patient: Lisa Greene           Date of Birth: November 23, 1940           MRN: [id number] Visit Date: 06/18/2021              Requested by: Marin Olp, MD [address],  [place] [address] PCP: Marin Olp, MD   Assessment & Plan: Visit Diagnoses:  1. Pain in left ankle and joints of left foot     Plan: [name] is about 2-1/2 months status post left ankle injury.  She had a combination of injuries where she rolled her ankle with an inversion stress and and struck the outside of her ankle with a metal box.  X-rays were negative.  She notes she still having some local tenderness but feels like it she is "a little bit better".  The ankle was not swollen.  Peroneal tendon function was intact.  Skin intact.  Neurologically intact.  Tenderness is localized along the distal fibula.  X-rays at 6 weeks post injury were negative for fracture.  She probably had a direct injury to the bone and may be even had some bone swelling or bruising but I think she is fine with a combination of Voltaren gel, the ankle support and good comfortable shoe.  I am not sure an MRI scan at this point would change our treatment plan.  All questions were answered  Follow-Up Instructions: Return in about 1 month (around 07/19/2021).   Orders:  No orders of the defined types were placed in this encounter.  No orders of the defined types were placed in this encounter.     Procedures: No procedures performed   Clinical Data: No additional findings.   Subjective: Chief Complaint  Patient presents with   Left Ankle - Follow-up  Patient presents today for a one month follow up on her left ankle. She has been wearing an ASO brace each day. Patient states that she has not really improved. She takes Tylenol if needed.   HPI  Review of Systems   Objective: Vital Signs: There were no vitals taken for this visit.  Physical Exam Constitutional:      Appearance: She  is well-developed.  Pulmonary:     Effort: Pulmonary effort is normal.  Skin:    General: Skin is warm and dry.  Neurological:     Mental Status: She is alert and oriented to person, place, and time.  Psychiatric:        Behavior: Behavior normal.    Ortho Exam awake alert and oriented x3.  Comfortable sitting.  Left ankle with some very minimal tenderness over the distal fibula.  Skin intact.  No instability.  Peroneal function intact and no pain along the peroneal tendons.  Sensation intact.  Good capillary refill to the toes.  No midfoot pain.  No hindfoot pain.  No pain along the anterior aspect of her ankle.  No evidence of any impingement  Specialty Comments:  No specialty comments available.  Imaging: No results found.   PMFS History: Patient Active Problem List   Diagnosis Date Noted   Pain in left ankle and joints of left foot 06/18/2021   COVID-19 02/25/2021   Vasomotor rhinitis 05/10/2019   Chronic allergic conjunctivitis 05/10/2019   Frequent PVCs 04/14/2017   Aortic atherosclerosis ([facility]) 11/23/2016   Low vitamin B12 level 06/24/2016   History of breast cancer- Breast cancer of lower-inner quadrant  of left female breast 11/01/2015   CKD (chronic kidney disease), stage III ([facility]) 10/22/2015   Family history of abdominal aortic aneurysm 03/08/2015   Asthma, moderate persistent, well-controlled 04/19/2014   Hypertension 11/25/2010   Eosinophilia 02/08/2009   Allergic rhinitis 01/31/2008   Hyperlipidemia 12/20/2006   Osteopenia 12/20/2006   Past Medical History:  Diagnosis Date   ALLERGIC RHINITIS 01/31/2008   Asthma    BCC (basal cell carcinoma)sup& nod 03/01/2017   right nostril   Breast cancer of lower-inner quadrant of left female breast ([facility]) 11/01/2015   treated with lumpectomy and radiation   Cataract    Colon cancer screening 05/21/2014   No polyps at age 65. Diverticulosis alone-no more colonoscopies.     DIVERTICULITIS, HX OF 09/07/2008   pt denies  this    Eosinophilia 02/08/2009   Fibroid    HYPERLIPIDEMIA 12/20/2006   Hypertension    OSTEOPOROSIS 12/20/2006   PVC's (premature ventricular contractions)    SCC (squamous cell carcinoma) well diff 11/28/2013   left side chin   Vitamin B 12 deficiency     Family History  Problem Relation Age of Onset   COPD Father    Heart disease Father        stent placed 72   Allergic rhinitis Father    Brain cancer Mother    Allergic rhinitis Maternal Grandmother    Colon cancer Neg Hx    Stomach cancer Neg Hx     Past Surgical History:  Procedure Laterality Date   BREAST LUMPECTOMY WITH NEEDLE LOCALIZATION Left 01/03/2016   Procedure: BREAST re-excision LUMPECTOMY WITH NEEDLE LOCALIZATION;  Surgeon: Rolm Bookbinder, MD;  Location: [facility];  Service: General;  Laterality: Left;  BREAST re-excision LUMPECTOMY WITH NEEDLE LOCALIZATION   BREAST LUMPECTOMY WITH RADIOACTIVE SEED LOCALIZATION Left 11/28/2015   Procedure: LEFT BREAST LUMPECTOMY WITH BRACKETED RADIOACTIVE SEED LOCALIZATION;  Surgeon: Rolm Bookbinder, MD;  Location: [facility];  Service: General;  Laterality: Left;   BREAST SURGERY     cysts removed x2   CATARACT EXTRACTION BILATERAL W/ ANTERIOR VITRECTOMY  2013   bilateral cataracts   COLONOSCOPY  2005   tics only    FOOT SURGERY     KNEE ARTHROSCOPY     left   NEUROMA SURGERY     x2 feet   thyroid duct cyst     Social History   Occupational History   Occupation: retired  Tobacco Use   Smoking status: Never   Smokeless tobacco: Never  Vaping Use   Vaping Use: Never used  Substance and Sexual Activity   Alcohol use: No    Alcohol/week: 0.0 standard drinks   Drug use: No   Sexual activity: Yes    Partners: Male    Birth control/protection: Post-menopausal

## 2021-06-19 ENCOUNTER — Encounter: Payer: Self-pay | Admitting: Physician Assistant

## 2021-06-19 ENCOUNTER — Ambulatory Visit: Payer: Medicare HMO | Admitting: Physician Assistant

## 2021-06-19 DIAGNOSIS — Z85828 Personal history of other malignant neoplasm of skin: Secondary | ICD-10-CM

## 2021-06-19 DIAGNOSIS — B009 Herpesviral infection, unspecified: Secondary | ICD-10-CM | POA: Diagnosis not present

## 2021-06-19 DIAGNOSIS — Z808 Family history of malignant neoplasm of other organs or systems: Secondary | ICD-10-CM

## 2021-06-19 MED ORDER — VALACYCLOVIR HCL 1 G PO TABS
1000.0000 mg | ORAL_TABLET | Freq: Every day | ORAL | 1 refills | Status: DC
Start: 2021-06-19 — End: 2021-07-08

## 2021-06-19 NOTE — Patient Instructions (Signed)
Take 2 when feel the sting and 2 --12  hrs later.

## 2021-06-25 ENCOUNTER — Encounter: Payer: Self-pay | Admitting: Physician Assistant

## 2021-06-25 NOTE — Progress Notes (Signed)
° °  Follow-Up Visit   Subjective  Lisa Greene is a 81 y.o. female who presents for the following: Skin Problem (Lesion on back thigh x 2 weeks, stinging feeling. Personal history of bcc and scc.  Family history of melanoma. ).   The following portions of the chart were reviewed this encounter and updated as appropriate:  Tobacco   Allergies   Meds   Problems   Med Hx   Surg Hx   Fam Hx       Objective  Well appearing patient in no apparent distress; mood and affect are within normal limits.  A full examination was performed including scalp, head, eyes, ears, nose, lips, neck, chest, axillae, abdomen, back, buttocks, bilateral upper extremities, bilateral lower extremities, hands, feet, fingers, toes, fingernails, and toenails. All findings within normal limits unless otherwise noted below.  Right Thigh - Posterior Xerosis- no blisters today   Assessment & Plan  Herpesviral infection Right Thigh - Posterior  valACYclovir (VALTREX) 1000 MG tablet - Right Thigh - Posterior Take 1 tablet (1,000 mg total) by mouth daily.  No atypical nevi noted at the time of the visit.  I, Tranell Wojtkiewicz, PA-C, have reviewed all documentation's for this visit.  The documentation on 06/25/21 for the exam, diagnosis, procedures and orders are all accurate and complete.

## 2021-07-08 ENCOUNTER — Other Ambulatory Visit: Payer: Self-pay

## 2021-07-08 ENCOUNTER — Ambulatory Visit (INDEPENDENT_AMBULATORY_CARE_PROVIDER_SITE_OTHER): Payer: Medicare HMO | Admitting: Family Medicine

## 2021-07-08 ENCOUNTER — Encounter: Payer: Self-pay | Admitting: Family Medicine

## 2021-07-08 VITALS — BP 130/70 | HR 70 | Temp 97.2°F | Ht 60.0 in | Wt 150.0 lb

## 2021-07-08 DIAGNOSIS — E538 Deficiency of other specified B group vitamins: Secondary | ICD-10-CM | POA: Diagnosis not present

## 2021-07-08 DIAGNOSIS — N1832 Chronic kidney disease, stage 3b: Secondary | ICD-10-CM | POA: Diagnosis not present

## 2021-07-08 DIAGNOSIS — I10 Essential (primary) hypertension: Secondary | ICD-10-CM

## 2021-07-08 DIAGNOSIS — E785 Hyperlipidemia, unspecified: Secondary | ICD-10-CM | POA: Diagnosis not present

## 2021-07-08 DIAGNOSIS — Z0000 Encounter for general adult medical examination without abnormal findings: Secondary | ICD-10-CM | POA: Diagnosis not present

## 2021-07-08 DIAGNOSIS — I700 Atherosclerosis of aorta: Secondary | ICD-10-CM | POA: Diagnosis not present

## 2021-07-08 LAB — COMPREHENSIVE METABOLIC PANEL
ALT: 19 U/L (ref 0–35)
AST: 21 U/L (ref 0–37)
Albumin: 4.1 g/dL (ref 3.5–5.2)
Alkaline Phosphatase: 64 U/L (ref 39–117)
BUN: 19 mg/dL (ref 6–23)
CO2: 28 mEq/L (ref 19–32)
Calcium: 9.4 mg/dL (ref 8.4–10.5)
Chloride: 103 mEq/L (ref 96–112)
Creatinine, Ser: 1.26 mg/dL — ABNORMAL HIGH (ref 0.40–1.20)
GFR: 40.19 mL/min — ABNORMAL LOW (ref >60.00)
Glucose, Bld: 93 mg/dL (ref 70–99)
Potassium: 4.2 mEq/L (ref 3.5–5.1)
Sodium: 136 mEq/L (ref 135–145)
Total Bilirubin: 0.8 mg/dL (ref 0.2–1.2)
Total Protein: 6.3 g/dL (ref 6.0–8.3)

## 2021-07-08 LAB — CBC WITH DIFFERENTIAL/PLATELET
Basophils Absolute: 0.1 10*3/uL (ref 0.0–0.1)
Basophils Relative: 1.1 % (ref 0.0–3.0)
Eosinophils Absolute: 0.4 10*3/uL (ref 0.0–0.7)
Eosinophils Relative: 6.7 % — ABNORMAL HIGH (ref 0.0–5.0)
HCT: 39.1 % (ref 36.0–46.0)
Hemoglobin: 13.3 g/dL (ref 12.0–15.0)
Lymphocytes Relative: 24.7 % (ref 12.0–46.0)
Lymphs Abs: 1.6 10*3/uL (ref 0.7–4.0)
MCHC: 33.9 g/dL (ref 30.0–36.0)
MCV: 90.6 fl (ref 78.0–100.0)
Monocytes Absolute: 0.7 10*3/uL (ref 0.1–1.0)
Monocytes Relative: 10.3 % (ref 3.0–12.0)
Neutro Abs: 3.8 10*3/uL (ref 1.4–7.7)
Neutrophils Relative %: 57.2 % (ref 43.0–77.0)
Platelets: 275 10*3/uL (ref 150.0–400.0)
RBC: 4.32 Mil/uL (ref 3.87–5.11)
RDW: 14.2 % (ref 11.5–15.5)
WBC: 6.6 10*3/uL (ref 4.0–10.5)

## 2021-07-08 LAB — VITAMIN B12: Vitamin B-12: >1504 pg/mL — ABNORMAL HIGH (ref 211–911)

## 2021-07-08 LAB — LIPID PANEL
Cholesterol: 168 mg/dL (ref 0–200)
HDL: 75 mg/dL (ref >39.00)
LDL Cholesterol: 74 mg/dL (ref 0–99)
NonHDL: 93.44
Total CHOL/HDL Ratio: 2
Triglycerides: 97 mg/dL (ref 0.0–149.0)
VLDL: 19.4 mg/dL (ref 0.0–40.0)

## 2021-07-08 MED ORDER — ATORVASTATIN CALCIUM 20 MG PO TABS: 20.0000 mg | ORAL_TABLET | Freq: Every day | ORAL | 3 refills | Status: DC

## 2021-07-08 MED ORDER — IRBESARTAN 150 MG PO TABS: 150.0000 mg | ORAL_TABLET | Freq: Every day | ORAL | 3 refills | Status: DC

## 2021-07-08 NOTE — Progress Notes (Signed)
Phone [phone]   Subjective:  Patient presents today for their annual physical. Chief complaint-noted.   See problem oriented charting- ROS- full  review of systems was completed and negative except for: ear pain, palpitations, seasonal allergies, food allergies  The following were reviewed and entered/updated in epic: Past Medical History:  Diagnosis Date   ALLERGIC RHINITIS 01/31/2008   Asthma    BCC (basal cell carcinoma)sup& nod 03/01/2017   right nostril   Breast cancer of lower-inner quadrant of left female breast ([place]) 11/01/2015   treated with lumpectomy and radiation   Cataract    Colon cancer screening 05/21/2014   No polyps at age 57. Diverticulosis alone-no more colonoscopies.     DIVERTICULITIS, HX OF 09/07/2008   pt denies this    Eosinophilia 02/08/2009   Fibroid    HYPERLIPIDEMIA 12/20/2006   Hypertension    OSTEOPOROSIS 12/20/2006   PVC's (premature ventricular contractions)    SCC (squamous cell carcinoma) well diff 11/28/2013   left side chin   Vitamin B 12 deficiency    Patient Active Problem List   Diagnosis Date Noted   Frequent PVCs 04/14/2017    Priority: High   History of breast cancer- Breast cancer of lower-inner quadrant of left female breast 11/01/2015    Priority: High   Low vitamin B12 level 06/24/2016    Priority: Medium    CKD (chronic kidney disease), stage III ([place]) 10/22/2015    Priority: Medium    Asthma, moderate persistent, well-controlled 04/19/2014    Priority: Medium    Hypertension 11/25/2010    Priority: Medium    Hyperlipidemia 12/20/2006    Priority: Medium    Osteopenia 12/20/2006    Priority: Medium    Vasomotor rhinitis 05/10/2019    Priority: Low   Chronic allergic conjunctivitis 05/10/2019    Priority: Low   Aortic atherosclerosis ([place]) 11/23/2016    Priority: Low   Family history of abdominal aortic aneurysm 03/08/2015    Priority: Low   Eosinophilia 02/08/2009    Priority: Low   Allergic rhinitis  01/31/2008    Priority: Low   Pain in left ankle and joints of left foot 06/18/2021   COVID-19 02/25/2021   Past Surgical History:  Procedure Laterality Date   BREAST LUMPECTOMY WITH NEEDLE LOCALIZATION Left 01/03/2016   Procedure: BREAST re-excision LUMPECTOMY WITH NEEDLE LOCALIZATION;  Surgeon: Rolm Bookbinder, MD;  Location: [facility];  Service: General;  Laterality: Left;  BREAST re-excision LUMPECTOMY WITH NEEDLE LOCALIZATION   BREAST LUMPECTOMY WITH RADIOACTIVE SEED LOCALIZATION Left 11/28/2015   Procedure: LEFT BREAST LUMPECTOMY WITH BRACKETED RADIOACTIVE SEED LOCALIZATION;  Surgeon: Rolm Bookbinder, MD;  Location: [facility];  Service: General;  Laterality: Left;   BREAST SURGERY     cysts removed x2   CATARACT EXTRACTION BILATERAL W/ ANTERIOR VITRECTOMY  2013   bilateral cataracts   COLONOSCOPY  2005   tics only    FOOT SURGERY     KNEE ARTHROSCOPY     left   NEUROMA SURGERY     x2 feet   thyroid duct cyst      Family History  Problem Relation Age of Onset   COPD Father    Heart disease Father        stent placed 80   Allergic rhinitis Father    Brain cancer Mother    Allergic rhinitis Maternal Grandmother    Colon cancer Neg Hx    Stomach cancer Neg Hx     Medications- reviewed  and updated Current Outpatient Medications  Medication Sig Dispense Refill   Albuterol Sulfate (PROAIR RESPICLICK) 284 (90 Base) MCG/ACT AEPB Inhale 1 puff into the lungs every 8 (eight) hours as needed (wheezing/shortness of breath).     atorvastatin (LIPITOR) 20 MG tablet Take 1 tablet (20 mg total) by mouth daily. 90 tablet 3   azelastine (OPTIVAR) 0.05 % ophthalmic solution      BREO ELLIPTA 200-25 MCG/INH AEPB Inhale 1 puff into the lungs daily.     Calcium 500-125 MG-UNIT TABS Take 1 tablet by mouth daily.     cetirizine (ZYRTEC) 10 MG tablet Take 10 mg by mouth daily.     cholecalciferol (VITAMIN D) 1000 UNITS tablet Take 2,000 Units by mouth daily.      fluticasone (FLONASE) 50 MCG/ACT nasal spray Place 1 spray into both nostrils daily as needed.      irbesartan (AVAPRO) 150 MG tablet Take 1 tablet (150 mg total) by mouth daily. 90 tablet 3   metoprolol succinate (TOPROL-XL) 50 MG 24 hr tablet Take 1 tablet (50 mg total) by mouth daily. 90 tablet 3   montelukast (SINGULAIR) 10 MG tablet Take 1 tablet by mouth at bedtime.     No current facility-administered medications for this visit.    Allergies-reviewed and updated Allergies  Allergen Reactions   Lisinopril Other (See Comments) and Cough    Dry cough and stinging rash   Latex Rash   Penicillins Rash    Has patient had a PCN reaction causing immediate rash, facial/tongue/throat swelling, SOB or lightheadedness with hypotension: Yes Has patient had a PCN reaction causing severe rash involving mucus membranes or skin necrosis: No Has patient had a PCN reaction that required hospitalization No Has patient had a PCN reaction occurring within the last 10 years: No If all of the above answers are "NO", then may proceed with Cephalosporin use.    Sulfonamide Derivatives Rash   Tape Rash    Band-aids break out skin!!    Social History   Social History Narrative   Married. No kids- 2 step kids. 5 step grandkids.  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 Oropharynx normal. TM normal. Turbinates erythematous with yellow discharge  Neck: no thyromegaly CV: RRR no murmurs rubs or gallops Lungs: CTAB no crackles, wheeze, rhonchi Abdomen: soft/nontender/nondistended/normal bowel sounds. No rebound or guarding.  Ext: no edema Skin: warm, dry Neuro: grossly normal, moves all  extremities, PERRLA   Assessment and Plan   81 y.o. female presenting for annual physical.  Health Maintenance counseling: 1. Anticipatory guidance: Patient counseled regarding regular dental exams -yearly, eye exams - yearly,  avoiding smoking and second hand smoke, limiting alcohol to 1 beverage per day- doesn't drink , no illicit drugs.   2. Risk factor reduction:  Advised patient of need for regular exercise and diet rich and fruits and vegetables to reduce risk of heart attack and stroke.  Exercise- using exercise bike- does this while crocheting- pulled back on treadmill due to her ankle- typically daily or  at min 5 days a week for 30 minutes.  Diet/weight management-stable from last year- 148 on home scales- discussed ongoing stbaility or mild loss.  Wt Readings from Last 3 Encounters:  07/08/21 150 lb (68 kg)  02/25/21 150 lb (68 kg)  02/10/21 157 lb 12.8 oz (71.6 kg)  3. Immunizations/screenings/ancillary studies- 2nd shingrix shot at [facility]. Had one here and is complete.  Immunization History  Administered Date(s) Administered   Fluad Quad(high Dose 65+) 04/07/2019, 05/07/2020   Influenza Whole 03/02/2011   Influenza, High Dose Seasonal PF 05/04/2016, 05/05/2019, 04/15/2021   PFIZER(Purple Top)SARS-COV-2 Vaccination 06/22/2019, 07/13/2019, 03/07/2020, 05/03/2020   Pneumococcal Conjugate-13 01/19/2007, 04/19/2014, 06/06/2015   Pneumococcal Polysaccharide-23 01/19/2007, 10/08/2014, 11/04/2015, 05/03/2017, 05/10/2018, 05/05/2019, 05/03/2020   Td 07/02/2002, 09/23/2017   Tdap 08/22/2012   Zoster Recombinat (Shingrix) 12/28/2018   Zoster, Live 02/10/2010   4. Cervical cancer screening- no vaginal bleeding or discharge- defer pelvic. Past age based screening recs for pap smear.  5. history of breast cancer- Malignant neoplasm of lower-inner quadrant of left breast in female s/p lumpectomy and reexcision, estrogen receptor positive -patient now off tamoxifen due to recurrent  UTI. Close follow-up  Dr. Donne Hazel. Prior saw Dr. Lindi Adie with oncology.  - thinks she will be released after this year. 12/11/20 mammogram 6. Colon cancer screening - may 2015 with no recall due to age. No blood in stool or melena.  7. Skin cancer screening- Dr. Denna Haggard yearly. advised regular sunscreen use. Denies worrisome, changing, or new skin lesions.  8. Birth control/STD check- only active with husband and postmenopausal 9. Osteoporosis screening at 78- see belw 10. Smoking associated screening - Never smoker  Status of chronic or acute concerns   # Sinus pressure S:sinus pressure and headache off and on for 2 weeks. Also with left ear pain (up to 10/10 pain if bothers her)- worse at night . Mainly frontal sinuses. Saline rinses help some. This morning fair amount of discolored congestion.  A/P: does have some allergies and this could be part of baseline issues- over 2 weeks of symptoms we could use antibiotic but since she is improving some she wants to continue current regimen since no ear infection- she will let us know if changes her mind if symptoms worsen or fail to improve   #hyperlipidemia #aortic atherosclerosis- LDL goal under 70 ideally S: Medication:atorvastatin 20 mg  Lab Results  Component Value Date   CHOL 148 06/28/2020   HDL 76.20 06/28/2020   LDLCALC 58 06/28/2020   LDLDIRECT 59.0 10/17/2019   TRIG 69.0 06/28/2020   CHOLHDL 2 06/28/2020   A/P: HLD-lipids at goal last check but due for repeat-update lipids today.  Presumed stable Aortic atherosclerosis- LDL goal 70 or less-update lipids-suspect aortic atherosclerosis stable  #hypertension with white coat element/PVCs on metoprolol S: medication: irbesartan 150mg , metoprolol 50 mg XR -Edema on amlodipine and with HCTZ had mild hyponatremia Home readings #s: readings 109-148/50s to 80s but most readings in 110s or 120s over 60s or 70s.  home cuff previously verified.   BP Readings from Last 3 Encounters:   07/08/21 130/70  02/10/21 (!) 162/82  12/31/20 130/75   A/P: Hypertension is well controlled as well as PVCs-continue current medication  #Chronic kidney disease stage III S: GFR is typically in the 40s range -Patient knows to avoid NSAIDs  -hold irbesartan if gets dehydrated A/P: Has been stable-update CMP today  # Asthma/allergies- follows with Dr. Rush Landmark: Maintenance Medication: Memory Dance and singulair (astelin and flonase and zyrtec as  needed as well) As needed medication: albuterol. Patient is using this perhaps once a year  A/P: doing well with this regimen- continue current meds and Dr. Donneta Romberg follow up    #Osteoporosis S: Last DEXA: Worsening bone density on 07/09/2020 and we discussed referral to endocrinology due to renal function being near border for several medications -01/29/21 Patient called in and stated that she was no longer taking alendronate (FOSAMAX) 70 MG tablet she stated she didn't like how it made her feel and she had hot flashes and she only took it once.   Calcium: 1200mg  (through diet ok) recommended - 600 through pill and some through diet Vitamin D: 1000 units a day recommended- she takes this Last vitamin D- normal January 2022  A/P: osteoporosis likely poorly controlled off fosamax but intolerable hot flashes.  - she prefers to recheck next year over endocrine referral. Hesitant with reclast due to potential for prolonged hot flashes. She is cautious about prolia   # B12 deficiency S: Current treatment/medication (oral vs. IM):  b12 every day Lab Results  Component Value Date   [id number] 06/28/2020   A/P: hopefully stable- update b12 today. Continue current meds for now   #Left ankle pain-patient working with orthopedics with last visit on 06/18/2021-plan at that time was for Voltaren gel, ankle support and hold off on MRI with follow-up in 1 month. Working with Dr. Durward Fortes  Recommended follow up: Return in about 6 months (around 01/05/2022) for  follow up- or sooner if needed. Future Appointments  Date Time Provider [place]  07/30/2021 10:00 AM Belva Crome, MD [facility]  09/11/2021 11:45 AM [facility]   Lab/Order associations: fasting   ICD-10-CM   1. Preventative health care  Z00.00     2. Primary hypertension  I10     3. Hyperlipidemia, unspecified hyperlipidemia type  E78.5 CBC with Differential/Platelet    Comprehensive metabolic panel    Lipid panel    atorvastatin (LIPITOR) 20 MG tablet    4. Aortic atherosclerosis (HCC)  I70.0     5. Stage 3b chronic kidney disease (HCC)  N18.32     6. Low vitamin B12 level  E53.8 B12     Meds ordered this encounter  Medications   atorvastatin (LIPITOR) 20 MG tablet    Sig: Take 1 tablet (20 mg total) by mouth daily.    Dispense:  90 tablet    Refill:  3   irbesartan (AVAPRO) 150 MG tablet    Sig: Take 1 tablet (150 mg total) by mouth daily.    Dispense:  90 tablet    Refill:  3   Return precautions advised.  Garret Reddish, MD

## 2021-07-08 NOTE — Patient Instructions (Addendum)
Health Maintenance Due  Topic Date Due   Zoster Vaccines- Shingrix (2 of 2)- please send [place] date of 2nd shot with shingrix  I am so sorry about all the billing issues with the first one 02/22/2019   Please stop by lab before you go If you have mychart- we will send your results within 3 business days of [place] receiving them.  If you do not have mychart- we will call you about results within 5 business days of [place] receiving them.  *please also note that you will see labs on mychart as soon as they post. I will later go in and write notes on them- will say "notes from Dr. Yong Channel"   Recommended follow up: Return in about 6 months (around 01/05/2022) for follow up- or sooner if needed.

## 2021-07-14 ENCOUNTER — Telehealth: Payer: Self-pay | Admitting: Family Medicine

## 2021-07-14 MED ORDER — DOXYCYCLINE HYCLATE 100 MG PO TABS: 100.0000 mg | ORAL_TABLET | Freq: Two times a day (BID) | ORAL | 0 refills | Status: AC

## 2021-07-14 NOTE — Telephone Encounter (Signed)
I sent in doxycycline for her since she has a penicillin allergy-we had already discussed antibiotics if not feeling better

## 2021-07-14 NOTE — Telephone Encounter (Signed)
Patient is requesting antibiotic. Stated her symptoms persist/ ear pulling. Wants to know if she needs to come back in for antibiotic or can Dr Yong Channel call them in? Last DOS 07/08/21.

## 2021-07-14 NOTE — Telephone Encounter (Signed)
Pt was just seen on 02/07.

## 2021-07-15 NOTE — Telephone Encounter (Signed)
Called and spoke with pt and gave below message.

## 2021-07-17 ENCOUNTER — Encounter: Payer: Self-pay | Admitting: Family Medicine

## 2021-07-29 NOTE — Progress Notes (Signed)
Cardiology Office Note:    Date:  07/30/2021   ID:  Lisa Greene, DOB 03-10-1941, MRN [id number]  PCP:  Marin Olp, MD  Cardiologist:  Sinclair Grooms, MD   Referring MD: Marin Olp, MD   Chief Complaint  Patient presents with   Coronary Artery Disease   Hospitalization Follow-up    Premature ventricular contractions   Hypertension    History of Present Illness:    Lisa Greene is a 81 y.o. female with a hx of hyperlipidemia, aortic atherosclerosis, and relatively frequent asymptomatic PVCs.  (28% burden by 48-hour monitor 2018)   No symptoms.  Denies dyspnea.  Very active.  No chest pain or claudication.  No orthopnea PND.  Past Medical History:  Diagnosis Date   ALLERGIC RHINITIS 01/31/2008   Asthma    BCC (basal cell carcinoma)sup& nod 03/01/2017   right nostril   Breast cancer of lower-inner quadrant of left female breast ([place]) 11/01/2015   treated with lumpectomy and radiation   Cataract    Colon cancer screening 05/21/2014   No polyps at age 85. Diverticulosis alone-no more colonoscopies.     DIVERTICULITIS, HX OF 09/07/2008   pt denies this    Eosinophilia 02/08/2009   Fibroid    HYPERLIPIDEMIA 12/20/2006   Hypertension    OSTEOPOROSIS 12/20/2006   PVC's (premature ventricular contractions)    SCC (squamous cell carcinoma) well diff 11/28/2013   left side chin   Vitamin B 12 deficiency     Past Surgical History:  Procedure Laterality Date   BREAST LUMPECTOMY WITH NEEDLE LOCALIZATION Left 01/03/2016   Procedure: BREAST re-excision LUMPECTOMY WITH NEEDLE LOCALIZATION;  Surgeon: Rolm Bookbinder, MD;  Location: [facility];  Service: General;  Laterality: Left;  BREAST re-excision LUMPECTOMY WITH NEEDLE LOCALIZATION   BREAST LUMPECTOMY WITH RADIOACTIVE SEED LOCALIZATION Left 11/28/2015   Procedure: LEFT BREAST LUMPECTOMY WITH BRACKETED RADIOACTIVE SEED LOCALIZATION;  Surgeon: Rolm Bookbinder, MD;  Location: [facility];  Service: General;  Laterality: Left;   BREAST SURGERY     cysts removed x2   CATARACT EXTRACTION BILATERAL W/ ANTERIOR VITRECTOMY  2013   bilateral cataracts   COLONOSCOPY  2005   tics only    FOOT SURGERY     KNEE ARTHROSCOPY     left   NEUROMA SURGERY     x2 feet   thyroid duct cyst      Current Medications: Current Meds  Medication Sig   Albuterol Sulfate (PROAIR RESPICLICK) 295 (90 Base) MCG/ACT AEPB Inhale 1 puff into the lungs every 8 (eight) hours as needed (wheezing/shortness of breath).   atorvastatin (LIPITOR) 20 MG tablet Take 1 tablet (20 mg total) by mouth daily.   azelastine (OPTIVAR) 0.05 % ophthalmic solution    BREO ELLIPTA 200-25 MCG/INH AEPB Inhale 1 puff into the lungs daily.   Calcium 500-125 MG-UNIT TABS Take 1,000 Units by mouth daily.   calcium citrate (CALCITRATE - DOSED IN MG ELEMENTAL CALCIUM) 950 (200 Ca) MG tablet Take 200 mg of elemental calcium by mouth daily.   cetirizine (ZYRTEC) 10 MG tablet Take 10 mg by mouth daily.   cholecalciferol (VITAMIN D) 1000 UNITS tablet Take 2,000 Units by mouth daily.   fluticasone (FLONASE) 50 MCG/ACT nasal spray Place 1 spray into both nostrils daily as needed.    hydrochlorothiazide (MICROZIDE) 12.5 MG capsule Take 1 capsule (12.5 mg total) by mouth every Monday, Wednesday, and Friday.   irbesartan (AVAPRO) 150 MG tablet Take  1 tablet (150 mg total) by mouth daily.   montelukast (SINGULAIR) 10 MG tablet Take 1 tablet by mouth at bedtime.   [DISCONTINUED] metoprolol succinate (TOPROL-XL) 50 MG 24 hr tablet Take 1 tablet (50 mg total) by mouth daily.     Allergies:   Lisinopril, Latex, Penicillins, Sulfonamide derivatives, and Tape   Social History   Socioeconomic History   Marital status: Married    Spouse name: Not on file   Number of children: Not on file   Years of education: Not on file   Highest education level: Not on file  Occupational History   Occupation: retired  Tobacco Use   Smoking  status: Never   Smokeless tobacco: Never  Vaping Use   Vaping Use: Never used  Substance and Sexual Activity   Alcohol use: No    Alcohol/week: 0.0 standard drinks   Drug use: No   Sexual activity: Yes    Partners: Male    Birth control/protection: Post-menopausal  Other Topics Concern   Not on file  Social History Narrative   Married. No kids- 2 step kids. 5 step grandkids.  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[employer])      Hobbies: beach, read, crochet, knit, watch tv   Social Determinants of Health   Financial Resource Strain: Low Risk    Difficulty of Paying Living Expenses: Not hard at all  Food Insecurity: No Food Insecurity   Worried About Charity fundraiser in the Last Year: Never true   Arboriculturist in the Last Year: Never true  Transportation Needs: No Transportation Needs   Lack of Transportation (Medical): No   Lack of Transportation (Non-Medical): No  Physical Activity: Sufficiently Active   Days of Exercise per Week: 6 days   Minutes of Exercise per Session: 30 min  Stress: No Stress Concern Present   Feeling of Stress : Not at all  Social Connections: Moderately Integrated   Frequency of Communication with Friends and Family: More than three times a week   Frequency of Social Gatherings with Friends and Family: More than three times a week   Attends Religious Services: More than 4 times per year   Active Member of [employer] or Organizations: No   Attends Music therapist: Never   Marital Status: Married     Family History: The patient's family history includes Allergic rhinitis in her father; Arthritis in her brother; Brain cancer in her mother; COPD in her brother and father; Healthy in her brother; Heart disease in her father. There is no history of Colon cancer or Stomach cancer.  ROS:   Please see the history of present illness.    Denies headache, orthopnea, PND.  All other systems reviewed and  are negative.  EKGs/Labs/Other Studies Reviewed:    The following studies were reviewed today: 2 D Doppler Echocardiogram: IMPRESSIONS     1. Infero basal hypokinesis . Left ventricular ejection fraction, by  estimation, is 50 to 55%. The left ventricle has low normal function. The  left ventricle demonstrates regional wall motion abnormalities (see  scoring diagram/findings for description).   Left ventricular diastolic parameters are consistent with Grade II  diastolic dysfunction (pseudonormalization). Elevated left ventricular  end-diastolic pressure.   2. Right ventricular systolic function is normal. The right ventricular  size is normal. There is normal pulmonary artery systolic pressure.   3. Left atrial size was mildly dilated.  4. The mitral valve is degenerative. Mild mitral valve regurgitation. No  evidence of mitral stenosis.   5. The aortic valve is tricuspid. Aortic valve regurgitation is not  visualized. No aortic stenosis is present.   6. The inferior vena cava is normal in size with greater than 50%  respiratory variability, suggesting right atrial pressure of 3 mmHg.   Comparison(s): 11/26/16 EF 60-65%. PA pressure 50mmHg.   EKG:  EKG normal sinus rhythm at 77 bpm with PVC couplets.  Tracing otherwise unremarkable.  Compared with prior, PVCs are more frequent.  Recent Labs: 07/08/2021: ALT 19; BUN 19; Creatinine, Ser 1.26; Hemoglobin 13.3; Platelets 275.0; Potassium 4.2; Sodium 136  Recent Lipid Panel    Component Value Date/Time   CHOL 168 07/08/2021 1022   TRIG 97.0 07/08/2021 1022   HDL 75.00 07/08/2021 1022   CHOLHDL 2 07/08/2021 1022   VLDL 19.4 07/08/2021 1022   LDLCALC 74 07/08/2021 1022   LDLCALC 58 04/02/2020 1138   LDLDIRECT 59.0 10/17/2019 0930    Physical Exam:    VS:  BP (!) 158/78    Pulse 77    Ht 5' (1.524 m)    Wt 149 lb 12.8 oz (67.9 kg)    SpO2 98%    BMI 29.26 kg/m     Wt Readings from Last 3 Encounters:  07/30/21 149 lb 12.8 oz  (67.9 kg)  07/08/21 150 lb (68 kg)  02/25/21 150 lb (68 kg)     GEN: Slightly overweight. No acute distress HEENT: Normal NECK: No JVD. LYMPHATICS: No lymphadenopathy CARDIAC: No murmur. RRR S4 gallop, or edema. VASCULAR:  Normal Pulses. No bruits. RESPIRATORY:  Clear to auscultation without rales, wheezing or rhonchi  ABDOMEN: Soft, non-tender, non-distended, No pulsatile mass, MUSCULOSKELETAL: No deformity  SKIN: Warm and dry NEUROLOGIC:  Alert and oriented x 3 PSYCHIATRIC:  Normal affect   ASSESSMENT:    1. Aortic atherosclerosis ([place])   2. Hyperlipidemia, unspecified hyperlipidemia type   3. Essential hypertension   4. PVC (premature ventricular contraction)   5. Stage 3b chronic kidney disease ([place])    PLAN:    In order of problems listed above:  Continue statin therapy and blood pressure control. Continue statin therapy with LDL target less than 70 Add HCTZ 12.5 mg Monday, Wednesday, and Friday.  Monitor pressures at home.  Notify us of any lightheadedness or dizziness.  Blood pressure is consistently below 841 mmHg systolic or too low.  Basic metabolic panel in 2 weeks Still present.  May need potassium supplementation on diuretic therapy With addition of diuretic, check creatinine in 2 weeks.  Target BP: <130/80 mmHg  Diet and lifestyle measures for BP control were reviewed in detail: Low sodium diet (<2.5 gm daily); alcohol restriction (<3 ounces per day); weight loss (Mediterranean); avoid non-steroidal agents; > 6 hours sleep per day; 150 min moderate exercise per week. Medical regimen will include at least 2 agents. Resistant hypertension if not controlled on 3 agents. Consider further evaluation: Sleep study to r/o OSA; Renal angiogram; Primary hyperaldonism and Pheochromocytoma w/u. After 3 agents, consider MRA (spironolactone)/ Epleronone), hydralazine, beta-blocker, and Minoxidil if not already in use due to patient profile.    Medication Adjustments/Labs  and Tests Ordered: Current medicines are reviewed at length with the patient today.  Concerns regarding medicines are outlined above.  Orders Placed This Encounter  Procedures   Basic metabolic panel   EKG [id number]   Meds ordered this encounter  Medications   metoprolol succinate (TOPROL-XL) 50  MG 24 hr tablet    Sig: Take 1 tablet (50 mg total) by mouth daily.    Dispense:  90 tablet    Refill:  3   hydrochlorothiazide (MICROZIDE) 12.5 MG capsule    Sig: Take 1 capsule (12.5 mg total) by mouth every Monday, Wednesday, and Friday.    Dispense:  45 capsule    Refill:  3    Patient Instructions  Medication Instructions:  1) START Hydrochlorothiazide 12.5mg  once daily on Monday, Wednesday and Friday  *If you need a refill on your cardiac medications before your next appointment, please call your pharmacy*   Lab Work: BMET in 2 weeks  If you have labs (blood work) drawn today and your tests are completely normal, you will receive your results only by: [place] (if you have MyChart) OR A paper copy in the mail If you have any lab test that is abnormal or we need to change your treatment, we will call you to review the results.   Testing/Procedures: None   Follow-Up: At [facility], you and your health needs are our priority.  As part of our continuing mission to provide you with exceptional heart care, we have created designated Provider Care Teams.  These Care Teams include your primary Cardiologist (physician) and Advanced Practice Providers (APPs -  Physician Assistants and Nurse Practitioners) who all work together to provide you with the care you need, when you need it.  We recommend signing up for the patient portal called "MyChart".  Sign up information is provided on this After Visit Summary.  MyChart is used to connect with patients for Virtual Visits (Telemedicine).  Patients are able to view lab/test results, encounter notes, upcoming appointments, etc.   Non-urgent messages can be sent to your provider as well.   To learn more about what you can do with MyChart, go to [email or web address].    Your next appointment:   1 year(s)  The format for your next appointment:   In Person  Provider:   Sinclair Grooms, MD     Other Instructions     Signed, Sinclair Grooms, MD  07/30/2021 10:46 AM    [facility]

## 2021-07-30 ENCOUNTER — Ambulatory Visit: Payer: Medicare HMO | Admitting: Interventional Cardiology

## 2021-07-30 ENCOUNTER — Encounter: Payer: Self-pay | Admitting: Interventional Cardiology

## 2021-07-30 ENCOUNTER — Other Ambulatory Visit: Payer: Self-pay

## 2021-07-30 VITALS — BP 158/78 | HR 77 | Ht 60.0 in | Wt 149.8 lb

## 2021-07-30 DIAGNOSIS — I10 Essential (primary) hypertension: Secondary | ICD-10-CM | POA: Diagnosis not present

## 2021-07-30 DIAGNOSIS — I493 Ventricular premature depolarization: Secondary | ICD-10-CM | POA: Diagnosis not present

## 2021-07-30 DIAGNOSIS — E785 Hyperlipidemia, unspecified: Secondary | ICD-10-CM | POA: Diagnosis not present

## 2021-07-30 DIAGNOSIS — N1832 Chronic kidney disease, stage 3b: Secondary | ICD-10-CM

## 2021-07-30 DIAGNOSIS — I700 Atherosclerosis of aorta: Secondary | ICD-10-CM | POA: Diagnosis not present

## 2021-07-30 MED ORDER — HYDROCHLOROTHIAZIDE 12.5 MG PO CAPS: 12.5000 mg | ORAL_CAPSULE | ORAL | 3 refills | Status: DC

## 2021-07-30 MED ORDER — METOPROLOL SUCCINATE ER 50 MG PO TB24: 50.0000 mg | ORAL_TABLET | Freq: Every day | ORAL | 3 refills | Status: DC

## 2021-07-30 NOTE — Patient Instructions (Signed)
Medication Instructions:  ?1) START Hydrochlorothiazide 12.5mg  once daily on Monday, Wednesday and Friday ? ?*If you need a refill on your cardiac medications before your next appointment, please call your pharmacy* ? ? ?Lab Work: ?BMET in 2 weeks ? ?If you have labs (blood work) drawn today and your tests are completely normal, you will receive your results only by: ?MyChart Message (if you have MyChart) OR ?A paper copy in the mail ?If you have any lab test that is abnormal or we need to change your treatment, we will call you to review the results. ? ? ?Testing/Procedures: ?None ? ? ?Follow-Up: ?At [facility], you and your health needs are our priority.  As part of our continuing mission to provide you with exceptional heart care, we have created designated Provider Care Teams.  These Care Teams include your primary Cardiologist (physician) and Advanced Practice Providers (APPs -  Physician Assistants and Nurse Practitioners) who all work together to provide you with the care you need, when you need it. ? ?We recommend signing up for the patient portal called "MyChart".  Sign up information is provided on this After Visit Summary.  MyChart is used to connect with patients for Virtual Visits (Telemedicine).  Patients are able to view lab/test results, encounter notes, upcoming appointments, etc.  Non-urgent messages can be sent to your provider as well.   ?To learn more about what you can do with MyChart, go to [email or web address].   ? ?Your next appointment:   ?1 year(s) ? ?The format for your next appointment:   ?In Person ? ?Provider:   ?Sinclair Grooms, MD   ? ? ?Other Instructions ?  ?

## 2021-08-13 ENCOUNTER — Other Ambulatory Visit: Payer: Self-pay

## 2021-08-13 ENCOUNTER — Other Ambulatory Visit: Payer: Medicare HMO | Admitting: *Deleted

## 2021-08-13 DIAGNOSIS — I10 Essential (primary) hypertension: Secondary | ICD-10-CM

## 2021-08-13 LAB — BASIC METABOLIC PANEL
BUN/Creatinine Ratio: 12 (ref 12–28)
BUN: 16 mg/dL (ref 8–27)
CO2: 24 mmol/L (ref 20–29)
Calcium: 8.9 mg/dL (ref 8.7–10.3)
Chloride: 100 mmol/L (ref 96–106)
Creatinine, Ser: 1.33 mg/dL — ABNORMAL HIGH (ref 0.57–1.00)
Glucose: 163 mg/dL — ABNORMAL HIGH (ref 70–99)
Potassium: 4 mmol/L (ref 3.5–5.2)
Sodium: 137 mmol/L (ref 134–144)
eGFR: 40 mL/min/{1.73_m2} — ABNORMAL LOW (ref >59)

## 2021-08-25 ENCOUNTER — Other Ambulatory Visit: Payer: Self-pay | Admitting: Physician Assistant

## 2021-08-25 DIAGNOSIS — B009 Herpesviral infection, unspecified: Secondary | ICD-10-CM

## 2021-08-29 ENCOUNTER — Telehealth: Payer: Self-pay | Admitting: Interventional Cardiology

## 2021-08-29 NOTE — Telephone Encounter (Signed)
?  Pt c/o BP issue: STAT if pt c/o blurred vision, one-sided weakness or slurred speech ? ?1. What are your last 5 BP readings? 104/74, 117/74 ? ?2. Are you having any other symptoms (ex. Dizziness, headache, blurred vision, passed out)? None  ? ?3. What is your BP issue? Pt said her BP been running low, her BP this morning was 104/74 then recent check 117/74. She did not take her BP med and fluid pill yet and she wanted to check in what she needs to do ? ?

## 2021-08-29 NOTE — Telephone Encounter (Signed)
Spoke with the patient who states that he SBP has been running around 125. She states this morning it was running lower at 104/74. She did not take any of her BP meds this morning. Later this afternoon her BP was 117/74. She denies any dizziness or lightheadedness. Advised that she can hold her HCTZ today. Advised to continue to monitor and let us know if she becomes symptomatic and if SBP is consistently low over several days. Patient verbalized understanding. Will make Dr. Tamala Julian aware for any further recommendations.  ?

## 2021-09-01 ENCOUNTER — Telehealth: Payer: Self-pay | Admitting: Family Medicine

## 2021-09-01 MED ORDER — LORAZEPAM 0.5 MG PO TABS: 0.2500 mg | ORAL_TABLET | Freq: Two times a day (BID) | ORAL | 0 refills | Status: DC | PRN

## 2021-09-01 NOTE — Telephone Encounter (Signed)
See below

## 2021-09-01 NOTE — Telephone Encounter (Signed)
Pt gave me the date and name of previous RX. It was originally prescribed by Morene Rankins on 12/18/19. ? ?MEDICATION: LORazepam (ATIVAN) 0.5 MG tablet [[id number]]  ?

## 2021-09-01 NOTE — Telephone Encounter (Signed)
I tried to review med list- I dont see what was used in past from an overview- does she recall what it was or around when it was? ?

## 2021-09-01 NOTE — Telephone Encounter (Signed)
Pt and her husband have recently lost another family member. They are having a difficult time and she is asking for [facility] to call them something in. I advised an appt but she stated she shouldn't have to. She stated Dr Yong Channel just called them in something the last time this happened. Please advise. ?

## 2021-09-01 NOTE — Telephone Encounter (Signed)
Spoke with the patient and advised her not to hold medications unless SBP below 100. Patient verbalized understanding. She took all meds as prescribed over the weekend. SBP ranged from 107-130. Advised patient that is an appropriate range for her BP's. Advised her to continue to monitor and let us know if BP is consistently out of range.  ?

## 2021-09-01 NOTE — Telephone Encounter (Signed)
Called and spoke with pt and she states she does not remember what or when it was but it was a small does for her and her husband for "stress". ?

## 2021-09-02 NOTE — Telephone Encounter (Signed)
Called and spoke with pt and made aware.  

## 2021-09-11 ENCOUNTER — Ambulatory Visit: Payer: Medicare HMO

## 2021-09-29 ENCOUNTER — Other Ambulatory Visit: Payer: Self-pay | Admitting: Family Medicine

## 2021-11-05 DIAGNOSIS — Z87440 Personal history of urinary (tract) infections: Secondary | ICD-10-CM | POA: Diagnosis not present

## 2021-11-05 DIAGNOSIS — N1832 Chronic kidney disease, stage 3b: Secondary | ICD-10-CM | POA: Diagnosis not present

## 2021-11-05 DIAGNOSIS — Z882 Allergy status to sulfonamides status: Secondary | ICD-10-CM | POA: Diagnosis not present

## 2021-11-05 DIAGNOSIS — Z7951 Long term (current) use of inhaled steroids: Secondary | ICD-10-CM | POA: Diagnosis not present

## 2021-11-05 DIAGNOSIS — I129 Hypertensive chronic kidney disease with stage 1 through stage 4 chronic kidney disease, or unspecified chronic kidney disease: Secondary | ICD-10-CM | POA: Diagnosis not present

## 2021-11-05 DIAGNOSIS — Z825 Family history of asthma and other chronic lower respiratory diseases: Secondary | ICD-10-CM | POA: Diagnosis not present

## 2021-11-05 DIAGNOSIS — E785 Hyperlipidemia, unspecified: Secondary | ICD-10-CM | POA: Diagnosis not present

## 2021-11-05 DIAGNOSIS — Z853 Personal history of malignant neoplasm of breast: Secondary | ICD-10-CM | POA: Diagnosis not present

## 2021-11-05 DIAGNOSIS — Z880 Allergy status to penicillin: Secondary | ICD-10-CM | POA: Diagnosis not present

## 2021-11-05 DIAGNOSIS — B029 Zoster without complications: Secondary | ICD-10-CM | POA: Diagnosis not present

## 2021-11-05 DIAGNOSIS — J45909 Unspecified asthma, uncomplicated: Secondary | ICD-10-CM | POA: Diagnosis not present

## 2021-11-10 ENCOUNTER — Telehealth: Payer: Self-pay | Admitting: Family Medicine

## 2021-11-10 NOTE — Telephone Encounter (Signed)
Copied from [place] [id number]. Topic: Medicare AWV >> Nov 10, 2021 11:29 AM Devoria Glassing wrote: Reason for CRM: Left message for patient to schedule Annual Wellness Visit.  Please schedule with Nurse Health Advisor Charlott Rakes, RN at [facility].  Please call [phone] ask for [facility]

## 2021-11-18 ENCOUNTER — Ambulatory Visit (INDEPENDENT_AMBULATORY_CARE_PROVIDER_SITE_OTHER): Payer: Medicare HMO

## 2021-11-18 DIAGNOSIS — Z0000 Encounter for general adult medical examination without abnormal findings: Secondary | ICD-10-CM | POA: Diagnosis not present

## 2021-11-18 NOTE — Patient Instructions (Addendum)
Lisa Greene , Thank you for taking time to come for your Medicare Wellness Visit. I appreciate your ongoing commitment to your health goals. Please review the following plan we discussed and let me know if I can assist you in the future.   Screening recommendations/referrals: Colonoscopy: no longer required  Mammogram: Done 12/11/20 repeat every year  Bone Density: Done 07/09/20 repeat every  Recommended yearly ophthalmology/optometry visit for glaucoma screening and checkup Recommended yearly dental visit for hygiene and checkup  Vaccinations: Influenza vaccine: Done 05/06/21 repeat every year  Pneumococcal vaccine: Up to date Tdap vaccine: Done 09/23/17 repeat every 10 years  Shingles vaccine: Completed 12/28/18 & 06/08/19   Covid-19:Completed 1/21, 2/11, 03/07/20 & 05/03/20  Advanced directives: Please bring a copy of your health care power of attorney and living will to the office at your convenience.  Conditions/risks identified: none at this time   Next appointment: Follow up in one year for your annual wellness visit    Preventive Care 65 Years and Older, Female Preventive care refers to lifestyle choices and visits with your health care provider that can promote health and wellness. What does preventive care include? A yearly physical exam. This is also called an annual well check. Dental exams once or twice a year. Routine eye exams. Ask your health care provider how often you should have your eyes checked. Personal lifestyle choices, including: Daily care of your teeth and gums. Regular physical activity. Eating a healthy diet. Avoiding tobacco and drug use. Limiting alcohol use. Practicing safe sex. Taking low-dose aspirin every day. Taking vitamin and mineral supplements as recommended by your health care provider. What happens during an annual well check? The services and screenings done by your health care provider during your annual well check will depend on your age,  overall health, lifestyle risk factors, and family history of disease. Counseling  Your health care provider may ask you questions about your: Alcohol use. Tobacco use. Drug use. Emotional well-being. Home and relationship well-being. Sexual activity. Eating habits. History of falls. Memory and ability to understand (cognition). Work and work Statistician. Reproductive health. Screening  You may have the following tests or measurements: Height, weight, and BMI. Blood pressure. Lipid and cholesterol levels. These may be checked every 5 years, or more frequently if you are over 40 years old. Skin check. Lung cancer screening. You may have this screening every year starting at age 37 if you have a 30-pack-year history of smoking and currently smoke or have quit within the past 15 years. Fecal occult blood test (FOBT) of the stool. You may have this test every year starting at age 31. Flexible sigmoidoscopy or colonoscopy. You may have a sigmoidoscopy every 5 years or a colonoscopy every 10 years starting at age 77. Hepatitis C blood test. Hepatitis B blood test. Sexually transmitted disease (STD) testing. Diabetes screening. This is done by checking your blood sugar (glucose) after you have not eaten for a while (fasting). You may have this done every 1-3 years. Bone density scan. This is done to screen for osteoporosis. You may have this done starting at age 3. Mammogram. This may be done every 1-2 years. Talk to your health care provider about how often you should have regular mammograms. Talk with your health care provider about your test results, treatment options, and if necessary, the need for more tests. Vaccines  Your health care provider may recommend certain vaccines, such as: Influenza vaccine. This is recommended every year. Tetanus, diphtheria, and acellular pertussis (  Tdap, Td) vaccine. You may need a Td booster every 10 years. Zoster vaccine. You may need this after age  16. Pneumococcal 13-valent conjugate (PCV13) vaccine. One dose is recommended after age 65. Pneumococcal polysaccharide (PPSV23) vaccine. One dose is recommended after age 66. Talk to your health care provider about which screenings and vaccines you need and how often you need them. This information is not intended to replace advice given to you by your health care provider. Make sure you discuss any questions you have with your health care provider. Document Released: 06/14/2015 Document Revised: 02/05/2016 Document Reviewed: 03/19/2015 Elsevier Interactive Patient Education  2017 [employer] Prevention in the Home Falls can cause injuries. They can happen to people of all ages. There are many things you can do to make your home safe and to help prevent falls. What can I do on the outside of my home? Regularly fix the edges of walkways and driveways and fix any cracks. Remove anything that might make you trip as you walk through a door, such as a raised step or threshold. Trim any bushes or trees on the path to your home. Use bright outdoor lighting. Clear any walking paths of anything that might make someone trip, such as rocks or tools. Regularly check to see if handrails are loose or broken. Make sure that both sides of any steps have handrails. Any raised decks and porches should have guardrails on the edges. Have any leaves, snow, or ice cleared regularly. Use sand or salt on walking paths during winter. Clean up any spills in your garage right away. This includes oil or grease spills. What can I do in the bathroom? Use night lights. Install grab bars by the toilet and in the tub and shower. Do not use towel bars as grab bars. Use non-skid mats or decals in the tub or shower. If you need to sit down in the shower, use a plastic, non-slip stool. Keep the floor dry. Clean up any water that spills on the floor as soon as it happens. Remove soap buildup in the tub or shower  regularly. Attach bath mats securely with double-sided non-slip rug tape. Do not have throw rugs and other things on the floor that can make you trip. What can I do in the bedroom? Use night lights. Make sure that you have a light by your bed that is easy to reach. Do not use any sheets or blankets that are too big for your bed. They should not hang down onto the floor. Have a firm chair that has side arms. You can use this for support while you get dressed. Do not have throw rugs and other things on the floor that can make you trip. What can I do in the kitchen? Clean up any spills right away. Avoid walking on wet floors. Keep items that you use a lot in easy-to-reach places. If you need to reach something above you, use a strong step stool that has a grab bar. Keep electrical cords out of the way. Do not use floor polish or wax that makes floors slippery. If you must use wax, use non-skid floor wax. Do not have throw rugs and other things on the floor that can make you trip. What can I do with my stairs? Do not leave any items on the stairs. Make sure that there are handrails on both sides of the stairs and use them. Fix handrails that are broken or loose. Make sure that handrails are  as long as the stairways. Check any carpeting to make sure that it is firmly attached to the stairs. Fix any carpet that is loose or worn. Avoid having throw rugs at the top or bottom of the stairs. If you do have throw rugs, attach them to the floor with carpet tape. Make sure that you have a light switch at the top of the stairs and the bottom of the stairs. If you do not have them, ask someone to add them for you. What else can I do to help prevent falls? Wear shoes that: Do not have high heels. Have rubber bottoms. Are comfortable and fit you well. Are closed at the toe. Do not wear sandals. If you use a stepladder: Make sure that it is fully opened. Do not climb a closed stepladder. Make sure that  both sides of the stepladder are locked into place. Ask someone to hold it for you, if possible. Clearly mark and make sure that you can see: Any grab bars or handrails. First and last steps. Where the edge of each step is. Use tools that help you move around (mobility aids) if they are needed. These include: Canes. Walkers. Scooters. Crutches. Turn on the lights when you go into a dark area. Replace any light bulbs as soon as they burn out. Set up your furniture so you have a clear path. Avoid moving your furniture around. If any of your floors are uneven, fix them. If there are any pets around you, be aware of where they are. Review your medicines with your doctor. Some medicines can make you feel dizzy. This can increase your chance of falling. Ask your doctor what other things that you can do to help prevent falls. This information is not intended to replace advice given to you by your health care provider. Make sure you discuss any questions you have with your health care provider. Document Released: 03/14/2009 Document Revised: 10/24/2015 Document Reviewed: 06/22/2014 Elsevier Interactive Patient Education  2017 [employer].

## 2021-11-18 NOTE — Progress Notes (Addendum)
Virtual Visit via Telephone Note  I connected with  Lisa Greene on 11/18/21 at 10:30 AM EDT by telephone and verified that I am speaking with the correct person using two identifiers.  Medicare Annual Wellness visit completed telephonically due to Covid-19 pandemic.   Persons participating in this call: This Health Coach and this patient.   Location: Patient: Home Provider: Office   I discussed the limitations, risks, security and privacy concerns of performing an evaluation and management service by telephone and the availability of in person appointments. The patient expressed understanding and agreed to proceed.  Unable to perform video visit due to video visit attempted and failed and/or patient does not have video capability.   Some vital signs may be absent or patient reported.   Willette Brace, LPN   Subjective:   Lisa Greene is a 81 y.o. female who presents for Medicare Annual (Subsequent) preventive examination.  Review of Systems     Cardiac Risk Factors include: advanced age (>66mn, >>32women);hypertension;dyslipidemia     Objective:    There were no vitals filed for this visit. There is no height or weight on file to calculate BMI.     11/18/2021   10:32 AM 09/02/2020   12:10 PM 03/16/2019    2:54 PM 11/06/2016   11:45 AM 07/30/2016   12:45 PM 05/16/2016    7:37 PM 04/30/2016   11:59 AM  Advanced Directives  Does Patient Have a Medical Advance Directive? Yes Yes Yes Yes Yes No No  Type of Advance Directive Healthcare [place]     Does patient want to make changes to medical advance directive?   No - Patient declined      Copy of [place] Chart? No - copy requested No - copy requested No - copy requested        Current Medications (verified) Outpatient Encounter Medications as of 11/18/2021  Medication Sig   Albuterol Sulfate (PROAIR  RESPICLICK) 1263(90 Base) MCG/ACT AEPB Inhale 1 puff into the lungs every 8 (eight) hours as needed (wheezing/shortness of breath).   atorvastatin (LIPITOR) 20 MG tablet Take 1 tablet (20 mg total) by mouth daily.   azelastine (OPTIVAR) 0.05 % ophthalmic solution    Calcium 500-125 MG-UNIT TABS Take 1,000 Units by mouth daily.   calcium citrate (CALCITRATE - DOSED IN MG ELEMENTAL CALCIUM) 950 (200 Ca) MG tablet Take 200 mg of elemental calcium by mouth daily.   cetirizine (ZYRTEC) 10 MG tablet Take 10 mg by mouth daily.   cholecalciferol (VITAMIN D) 1000 UNITS tablet Take 2,000 Units by mouth daily.   Cyanocobalamin (VITAMIN B 12 PO) Take 1,000 mg by mouth every other day.   fluticasone (FLONASE) 50 MCG/ACT nasal spray Place 1 spray into both nostrils daily as needed.    hydrochlorothiazide (MICROZIDE) 12.5 MG capsule Take 1 capsule (12.5 mg total) by mouth every Monday, Wednesday, and Friday.   irbesartan (AVAPRO) 150 MG tablet TAKE ONE TABLET BY MOUTH DAILY   LORazepam (ATIVAN) 0.5 MG tablet Take 0.5 tablets (0.25 mg total) by mouth 2 (two) times daily as needed for anxiety (Do not drive for 8 hours after taking).   metoprolol succinate (TOPROL-XL) 50 MG 24 hr tablet Take 1 tablet (50 mg total) by mouth daily.   montelukast (SINGULAIR) 10 MG tablet Take 1 tablet by mouth at bedtime.   BREO ELLIPTA 200-25 MCG/INH AEPB Inhale 1  puff into the lungs daily. (Patient not taking: Reported on 11/18/2021)   [DISCONTINUED] albuterol (PROVENTIL) 90 MCG/ACT inhaler Inhale 2 puffs into the lungs every 6 (six) hours as needed for wheezing.   [DISCONTINUED] pantoprazole (PROTONIX) 40 MG tablet Take 1 tablet by mouth daily.   No facility-administered encounter medications on file as of 11/18/2021.    Allergies (verified) Lisinopril, Latex, Penicillins, Sulfonamide derivatives, and Tape   History: Past Medical History:  Diagnosis Date   ALLERGIC RHINITIS 01/31/2008   Asthma    BCC (basal cell  carcinoma)sup& nod 03/01/2017   right nostril   Breast cancer of lower-inner quadrant of left female breast ([place]) 11/01/2015   treated with lumpectomy and radiation   Cataract    Colon cancer screening 05/21/2014   No polyps at age 79. Diverticulosis alone-no more colonoscopies.     DIVERTICULITIS, HX OF 09/07/2008   pt denies this    Eosinophilia 02/08/2009   Fibroid    HYPERLIPIDEMIA 12/20/2006   Hypertension    OSTEOPOROSIS 12/20/2006   PVC's (premature ventricular contractions)    SCC (squamous cell carcinoma) well diff 11/28/2013   left side chin   Vitamin B 12 deficiency    Past Surgical History:  Procedure Laterality Date   BREAST LUMPECTOMY WITH NEEDLE LOCALIZATION Left 01/03/2016   Procedure: BREAST re-excision LUMPECTOMY WITH NEEDLE LOCALIZATION;  Surgeon: Rolm Bookbinder, MD;  Location: [facility];  Service: General;  Laterality: Left;  BREAST re-excision LUMPECTOMY WITH NEEDLE LOCALIZATION   BREAST LUMPECTOMY WITH RADIOACTIVE SEED LOCALIZATION Left 11/28/2015   Procedure: LEFT BREAST LUMPECTOMY WITH BRACKETED RADIOACTIVE SEED LOCALIZATION;  Surgeon: Rolm Bookbinder, MD;  Location: [facility];  Service: General;  Laterality: Left;   BREAST SURGERY     cysts removed x2   CATARACT EXTRACTION BILATERAL W/ ANTERIOR VITRECTOMY  2013   bilateral cataracts   COLONOSCOPY  2005   tics only    FOOT SURGERY     KNEE ARTHROSCOPY     left   NEUROMA SURGERY     x2 feet   thyroid duct cyst     Family History  Problem Relation Age of Onset   Brain cancer Mother    COPD Father    Heart disease Father        stent placed 73   Allergic rhinitis Father    COPD Brother        died of flu/double pna age 32   Healthy Brother        lives local- sees Dr. Yong Channel   Arthritis Brother        back issues   Colon cancer Neg Hx    Stomach cancer Neg Hx    Social History   Socioeconomic History   Marital status: Married    Spouse name: Not on file    Number of children: Not on file   Years of education: Not on file   Highest education level: Not on file  Occupational History   Occupation: retired  Tobacco Use   Smoking status: Never   Smokeless tobacco: Never  Vaping Use   Vaping Use: Never used  Substance and Sexual Activity   Alcohol use: No    Alcohol/week: 0.0 standard drinks of alcohol   Drug use: No   Sexual activity: Yes    Partners: Male    Birth control/protection: Post-menopausal  Other Topics Concern   Not on file  Social History Narrative   Married. No kids- 2 step kids. 5 step  grandkids.  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[employer])      Hobbies: beach, read, crochet, knit, watch tv   Social Determinants of Health   Financial Resource Strain: [place]  (11/18/2021)   Overall Financial Resource Strain (CARDIA)    Difficulty of Paying Living Expenses: Not hard at all  Food Insecurity: No Food Insecurity (11/18/2021)   Hunger Vital Sign    Worried About Running Out of Food in the Last Year: Never true    [place] in the Last Year: Never true  Transportation Needs: No Transportation Needs (11/18/2021)   PRAPARE - Hydrologist (Medical): No    Lack of Transportation (Non-Medical): No  Physical Activity: Sufficiently Active (11/18/2021)   Exercise Vital Sign    Days of Exercise per Week: 6 days    Minutes of Exercise per Session: 30 min  Stress: No Stress Concern Present (11/18/2021)   [place]    Feeling of Stress : Not at all  Social Connections: Moderately Integrated (11/18/2021)   Social Connection and Isolation Panel [NHANES]    Frequency of Communication with Friends and Family: More than three times a week    Frequency of Social Gatherings with Friends and Family: More than three times a week    Attends Religious Services: More than 4 times per year    Active  Member of [employer] or Organizations: No    Attends Music therapist: Never    Marital Status: Married    Tobacco Counseling Counseling given: Not Answered   Clinical Intake:  Pre-visit preparation completed: Yes  Pain : No/denies pain     BMI - recorded: 29.26 Nutritional Status: BMI 25 -29 Overweight Nutritional Risks: None Diabetes: No  How often do you need to have someone help you when you read instructions, pamphlets, or other written materials from your doctor or pharmacy?: 1 - Never  Diabetic?no  Interpreter Needed?: No  Information entered by :: Charlott Rakes, LPN   Activities of Daily Living    11/18/2021   10:34 AM  In your present state of health, do you have any difficulty performing the following activities:  Hearing? 0  Vision? 0  Difficulty concentrating or making decisions? 0  Walking or climbing stairs? 0  Dressing or bathing? 0  Doing errands, shopping? 0  Preparing Food and eating ? N  Using the Toilet? N  In the past six months, have you accidently leaked urine? N  Do you have problems with loss of bowel control? N  Managing your Medications? N  Managing your Finances? N  Housekeeping or managing your Housekeeping? N    Patient Care Team: Marin Olp, MD as PCP - General (Family Medicine) Belva Crome, MD as PCP - Cardiology (Cardiology) Rolm Bookbinder, MD as Consulting Physician (General Surgery) Nicholas Lose, MD as Consulting Physician (Hematology and Oncology) Kyung Rudd, MD as Consulting Physician (Radiation Oncology) Katy Apo, MD as Consulting Physician (Ophthalmology) Mosetta Anis, MD as Referring Physician (Allergy) Warren Danes, PA-C as Physician Assistant (Dermatology)  Indicate any recent Medical Services you may have received from other than [facility] providers in the past year (date may be approximate).     Assessment:   This is a routine wellness examination for [place].  Hearing/Vision  screen Hearing Screening - Comments:: Pt denies  any hearing issues  Vision Screening -  Comments:: Pt follows up with Dr Prudencio Burly for annual eye exams   Dietary issues and exercise activities discussed: Current Exercise Habits: The patient has a physically strenuous job, but has no regular exercise apart from work. (does gardening and yard work daily)   Goals Addressed             This Visit's Progress    Patient Stated       None at this time        Depression Screen    11/18/2021   10:32 AM 07/08/2021    9:19 AM 12/31/2020   11:14 AM 09/02/2020   12:09 PM 07/02/2020    4:06 PM 05/11/2019    8:06 AM 03/16/2019    2:54 PM  PHQ 2/9 Scores  PHQ - 2 Score 0 0 0 0 0 0 0  PHQ- 9 Score  0 0   0     Fall Risk    11/18/2021   10:34 AM 02/10/2021    9:35 AM 12/31/2020   11:14 AM 09/02/2020   12:11 PM 07/02/2020    4:06 PM  Fall Risk   Falls in the past year? 0 0 0 0 0  Number falls in past yr: 0   0 0  Injury with Fall? 0   0 0  Risk for fall due to :    Impaired vision   Follow up Falls prevention discussed   Falls prevention discussed     FALL RISK PREVENTION PERTAINING TO THE HOME:  Any stairs in or around the home? Yes  If so, are there any without handrails? No  Home free of loose throw rugs in walkways, pet beds, electrical cords, etc? Yes  Adequate lighting in your home to reduce risk of falls? Yes   ASSISTIVE DEVICES UTILIZED TO PREVENT FALLS:  Life alert? No  Use of a cane, walker or w/c? No  Grab bars in the bathroom? No  Shower chair or bench in shower? Yes  Elevated toilet seat or a handicapped toilet? No   TIMED UP AND GO:  Was the test performed? No .   Cognitive Function: Declined at this time         11/18/2021   10:35 AM 09/02/2020   12:13 PM 03/16/2019    3:12 PM  6CIT Screen  What Year? 0 points 0 points 0 points  What month? 0 points 0 points 0 points  What time? 0 points  0 points  Count back from 20 0 points 0 points 0 points  Months in reverse  0 points 0 points 0 points  Repeat phrase 2 points 0 points 0 points  Total Score 2 points  0 points    Immunizations Immunization History  Administered Date(s) Administered   Fluad Quad(high Dose 65+) 04/07/2019, 05/07/2020   Influenza Whole 03/02/2011   Influenza, High Dose Seasonal PF 05/04/2016, 05/05/2019, 04/15/2021   PFIZER(Purple Top)SARS-COV-2 Vaccination 06/22/2019, 07/13/2019, 03/07/2020, 05/03/2020   Pneumococcal Conjugate-13 01/19/2007, 04/19/2014, 06/06/2015   Pneumococcal Polysaccharide-23 01/19/2007, 10/08/2014, 11/04/2015, 05/03/2017, 05/10/2018, 05/05/2019, 05/03/2020   Td 07/02/2002, 09/23/2017   Tdap 08/22/2012   Zoster Recombinat (Shingrix) 12/28/2018, 06/08/2019   Zoster, Live 02/10/2010    TDAP status: Up to date  Flu Vaccine status: Up to date  Pneumococcal vaccine status: Up to date  Covid-19 vaccine status: Completed vaccines  Qualifies for Shingles Vaccine? Yes   Zostavax completed Yes   Shingrix Completed?: Yes  Screening Tests Health Maintenance  Topic Date Due   VOJJK-09  Vaccine (5 - Pfizer series) 06/28/2020   MAMMOGRAM  12/11/2021   INFLUENZA VACCINE  12/30/2021   TETANUS/TDAP  09/24/2027   Pneumonia Vaccine 93+ Years old  Completed   DEXA SCAN  Completed   Zoster Vaccines- Shingrix  Completed   HPV VACCINES  Aged Out    Health Maintenance  Health Maintenance Due  Topic Date Due   COVID-19 Vaccine (5 - Pfizer series) 06/28/2020    Colorectal cancer screening: No longer required.   Mammogram status: Completed 12/11/20. Repeat every year  Bone Density status: Completed 07/09/20. Results reflect: Bone density results: OSTEOPOROSIS. Repeat every 2 years.   Additional Screening:   Vision Screening: Recommended annual ophthalmology exams for early detection of glaucoma and other disorders of the eye. Is the patient up to date with their annual eye exam?  Yes  Who is the provider or what is the name of the office in which the  patient attends annual eye exams? Dr Prudencio Burly  If pt is not established with a provider, would they like to be referred to a provider to establish care? No .   Dental Screening: Recommended annual dental exams for proper oral hygiene  Community Resource Referral / Chronic Care Management: CRR required this visit?  No   CCM required this visit?  No      Plan:     I have personally reviewed and noted the following in the patient's chart:   Medical and social history Use of alcohol, tobacco or illicit drugs  Current medications and supplements including opioid prescriptions.  Functional ability and status Nutritional status Physical activity Advanced directives List of other physicians Hospitalizations, surgeries, and ER visits in previous 12 months Vitals Screenings to include cognitive, depression, and falls Referrals and appointments  In addition, I have reviewed and discussed with patient certain preventive protocols, quality metrics, and best practice recommendations. A written personalized care plan for preventive services as well as general preventive health recommendations were provided to patient.     Willette Brace, LPN   [id number]/[id number]   Nurse Notes: None

## 2021-12-15 DIAGNOSIS — Z1231 Encounter for screening mammogram for malignant neoplasm of breast: Secondary | ICD-10-CM | POA: Diagnosis not present

## 2021-12-15 LAB — HM MAMMOGRAPHY

## 2021-12-16 ENCOUNTER — Ambulatory Visit (INDEPENDENT_AMBULATORY_CARE_PROVIDER_SITE_OTHER): Payer: Medicare HMO | Admitting: Physician Assistant

## 2021-12-16 ENCOUNTER — Encounter: Payer: Self-pay | Admitting: Physician Assistant

## 2021-12-16 VITALS — BP 120/60 | HR 72 | Temp 98.4°F | Ht 60.0 in | Wt 150.4 lb

## 2021-12-16 DIAGNOSIS — J029 Acute pharyngitis, unspecified: Secondary | ICD-10-CM

## 2021-12-16 LAB — POCT RAPID STREP A (OFFICE): Rapid Strep A Screen: POSITIVE — AB

## 2021-12-16 MED ORDER — AMOXICILLIN 500 MG PO CAPS: 500.0000 mg | ORAL_CAPSULE | Freq: Two times a day (BID) | ORAL | 0 refills | Status: DC

## 2021-12-16 NOTE — Patient Instructions (Signed)
It was great to see you!  Start oral amoxicillin -- 500 mg twice daily Push fluids and rest  Follow-up if worsening  Take care,  Inda Coke PA-C

## 2021-12-16 NOTE — Progress Notes (Signed)
Lisa Greene is a 81 y.o. female here for a new problem of sore throat.   History of Present Illness:   Chief Complaint  Patient presents with   Cough   Sore Throat    Started on Saturday, not expectorating anything, no fever, no difficulty swallowing.    HPI  Sore Throat  Patient complains of sore throat that has been present for 4 days. Symptoms seems to be getting better today except for nasal congestion which has significantly increased today. Associated with cough and nasal drainage, She has tried Zyrtec with no relief. No other specific treatment tried. No fever or chills. No difficulty swallowing. No ear pain or pressure. No nausea or vomiting.   Past Medical History:  Diagnosis Date   ALLERGIC RHINITIS 01/31/2008   Asthma    BCC (basal cell carcinoma)sup& nod 03/01/2017   right nostril   Breast cancer of lower-inner quadrant of left female breast ([place]) 11/01/2015   treated with lumpectomy and radiation   Cataract    Colon cancer screening 05/21/2014   No polyps at age 31. Diverticulosis alone-no more colonoscopies.     DIVERTICULITIS, HX OF 09/07/2008   pt denies this    Eosinophilia 02/08/2009   Fibroid    HYPERLIPIDEMIA 12/20/2006   Hypertension    OSTEOPOROSIS 12/20/2006   PVC's (premature ventricular contractions)    SCC (squamous cell carcinoma) well diff 11/28/2013   left side chin   Vitamin B 12 deficiency      Social History   Tobacco Use   Smoking status: Never   Smokeless tobacco: Never  Vaping Use   Vaping Use: Never used  Substance Use Topics   Alcohol use: No    Alcohol/week: 0.0 standard drinks of alcohol   Drug use: No    Past Surgical History:  Procedure Laterality Date   BREAST LUMPECTOMY WITH NEEDLE LOCALIZATION Left 01/03/2016   Procedure: BREAST re-excision LUMPECTOMY WITH NEEDLE LOCALIZATION;  Surgeon: Rolm Bookbinder, MD;  Location: [facility];  Service: General;  Laterality: Left;  BREAST re-excision LUMPECTOMY WITH  NEEDLE LOCALIZATION   BREAST LUMPECTOMY WITH RADIOACTIVE SEED LOCALIZATION Left 11/28/2015   Procedure: LEFT BREAST LUMPECTOMY WITH BRACKETED RADIOACTIVE SEED LOCALIZATION;  Surgeon: Rolm Bookbinder, MD;  Location: [facility];  Service: General;  Laterality: Left;   BREAST SURGERY     cysts removed x2   CATARACT EXTRACTION BILATERAL W/ ANTERIOR VITRECTOMY  2013   bilateral cataracts   COLONOSCOPY  2005   tics only    FOOT SURGERY     KNEE ARTHROSCOPY     left   NEUROMA SURGERY     x2 feet   thyroid duct cyst      Family History  Problem Relation Age of Onset   Brain cancer Mother    COPD Father    Heart disease Father        stent placed 24   Allergic rhinitis Father    COPD Brother        died of flu/double pna age 66   Healthy Brother        lives local- sees Dr. Yong Channel   Arthritis Brother        back issues   Colon cancer Neg Hx    Stomach cancer Neg Hx     Allergies  Allergen Reactions   Lisinopril Other (See Comments) and Cough    Dry cough and stinging rash   Latex Rash   Penicillins Rash  Has patient had a PCN reaction causing immediate rash, facial/tongue/throat swelling, SOB or lightheadedness with hypotension: Yes Has patient had a PCN reaction causing severe rash involving mucus membranes or skin necrosis: No Has patient had a PCN reaction that required hospitalization No Has patient had a PCN reaction occurring within the last 10 years: No If all of the above answers are "NO", then may proceed with Cephalosporin use.    Sulfonamide Derivatives Rash   Tape Rash    Band-aids break out skin!!    Current Medications:   Current Outpatient Medications:    Albuterol Sulfate (PROAIR RESPICLICK) 419 (90 Base) MCG/ACT AEPB, Inhale 1 puff into the lungs every 8 (eight) hours as needed (wheezing/shortness of breath)., Disp: , Rfl:    atorvastatin (LIPITOR) 20 MG tablet, Take 1 tablet (20 mg total) by mouth daily., Disp: 90 tablet, Rfl: 3    azelastine (OPTIVAR) 0.05 % ophthalmic solution, , Disp: , Rfl:    BREO ELLIPTA 200-25 MCG/INH AEPB, Inhale 1 puff into the lungs daily., Disp: , Rfl:    calcium citrate (CALCITRATE - DOSED IN MG ELEMENTAL CALCIUM) 950 (200 Ca) MG tablet, Take 200 mg of elemental calcium by mouth daily., Disp: , Rfl:    cetirizine (ZYRTEC) 10 MG tablet, Take 10 mg by mouth daily., Disp: , Rfl:    cholecalciferol (VITAMIN D) 1000 UNITS tablet, Take 2,000 Units by mouth daily., Disp: , Rfl:    Cyanocobalamin (VITAMIN B 12 PO), Take 1,000 mg by mouth every other day., Disp: , Rfl:    fluticasone (FLONASE) 50 MCG/ACT nasal spray, Place 1 spray into both nostrils daily as needed. , Disp: , Rfl:    hydrochlorothiazide (MICROZIDE) 12.5 MG capsule, Take 1 capsule (12.5 mg total) by mouth every Monday, Wednesday, and Friday., Disp: 45 capsule, Rfl: 3   irbesartan (AVAPRO) 150 MG tablet, TAKE ONE TABLET BY MOUTH DAILY, Disp: 90 tablet, Rfl: 3   metoprolol succinate (TOPROL-XL) 50 MG 24 hr tablet, Take 1 tablet (50 mg total) by mouth daily., Disp: 90 tablet, Rfl: 3   montelukast (SINGULAIR) 10 MG tablet, Take 1 tablet by mouth at bedtime., Disp: , Rfl:    Review of Systems:   ROS Negative unless otherwise specified per HPI.  Vitals:   Vitals:   12/16/21 1538  BP: 120/60  Pulse: 72  Temp: 98.4 F (36.9 C)  TempSrc: Temporal  SpO2: 97%  Weight: 150 lb 6.1 oz (68.2 kg)  Height: 5' (1.524 m)     Body mass index is 29.37 kg/m.  Physical Exam:   Physical Exam Vitals and nursing note reviewed.  Constitutional:      General: She is not in acute distress.    Appearance: She is well-developed. She is not ill-appearing or toxic-appearing.  HENT:     Head: Normocephalic and atraumatic.     Right Ear: Tympanic membrane, ear canal and external ear normal. Tympanic membrane is not erythematous, retracted or bulging.     Left Ear: Tympanic membrane, ear canal and external ear normal. Tympanic membrane is not  erythematous, retracted or bulging.     Nose: Nose normal.     Right Sinus: No maxillary sinus tenderness or frontal sinus tenderness.     Left Sinus: No maxillary sinus tenderness or frontal sinus tenderness.     Mouth/Throat:     Pharynx: Uvula midline. No posterior oropharyngeal erythema.  Eyes:     General: Lids are normal.     Conjunctiva/sclera: Conjunctivae normal.  Neck:  Trachea: Trachea normal.  Cardiovascular:     Rate and Rhythm: Normal rate and regular rhythm.     Heart sounds: Normal heart sounds, S1 normal and S2 normal.  Pulmonary:     Effort: Pulmonary effort is normal.     Breath sounds: Normal breath sounds. No decreased breath sounds, wheezing, rhonchi or rales.  Lymphadenopathy:     Cervical: No cervical adenopathy.  Skin:    General: Skin is warm and dry.  Neurological:     Mental Status: She is alert.  Psychiatric:        Speech: Speech normal.        Behavior: Behavior normal. Behavior is cooperative.    Results for orders placed or performed in visit on 12/16/21  POCT rapid strep A  Result Value Ref Range   Rapid Strep A Screen Positive (A) Negative     Assessment and Plan:   Sore throat Strep positive No red flags She has PCN allergy but has taken augmentin in the past w/o concern Start amox 500 mg bid x 10 d Follow-up if new/worsening/lack of improvement  I,Savera Zaman,acting as a scribe for [employer], PA.,have documented all relevant documentation on the behalf of Inda Coke, PA,as directed by  Inda Coke, PA while in the presence of Inda Coke, [place].   I, Inda Coke, [place], have reviewed all documentation for this visit. The documentation on 12/16/21 for the exam, diagnosis, procedures, and orders are all accurate and complete.   Inda Coke, PA-C

## 2021-12-25 ENCOUNTER — Encounter: Payer: Self-pay | Admitting: Family Medicine

## 2021-12-26 ENCOUNTER — Ambulatory Visit: Payer: Medicare HMO | Admitting: Physician Assistant

## 2021-12-26 ENCOUNTER — Ambulatory Visit (INDEPENDENT_AMBULATORY_CARE_PROVIDER_SITE_OTHER): Payer: Medicare HMO | Admitting: Family Medicine

## 2021-12-26 ENCOUNTER — Encounter: Payer: Self-pay | Admitting: Family Medicine

## 2021-12-26 VITALS — BP 110/70 | HR 68 | Temp 98.0°F | Ht 60.0 in | Wt 149.4 lb

## 2021-12-26 DIAGNOSIS — I10 Essential (primary) hypertension: Secondary | ICD-10-CM

## 2021-12-26 DIAGNOSIS — J329 Chronic sinusitis, unspecified: Secondary | ICD-10-CM

## 2021-12-26 DIAGNOSIS — R051 Acute cough: Secondary | ICD-10-CM | POA: Diagnosis not present

## 2021-12-26 DIAGNOSIS — B9689 Other specified bacterial agents as the cause of diseases classified elsewhere: Secondary | ICD-10-CM

## 2021-12-26 MED ORDER — AZITHROMYCIN 250 MG PO TABS: ORAL_TABLET | ORAL | 0 refills | Status: DC

## 2021-12-26 NOTE — Patient Instructions (Addendum)
Sign release of information at check out for Mammogram results  Appears that strep throat has been appropriately treated.  You are having lingering cough, sinus congestion and you have tenderness in your left maxillary sinus as well as enlarged nasal turbinates and some drainage in the back of your throat-I am concerned about a bacterial sinusitis.  Typically I would use Augmentin but since you just were treated with amoxicillin for 10 days we opted to try azithromycin for 5 days-this also provides some respiratory coverage.  We strongly consider doxycycline but this could cause a rash when outdoors so we opted out of this option.  Recommended follow up: Return for next already scheduled visit or sooner if needed.

## 2021-12-26 NOTE — Telephone Encounter (Signed)
Please double book the 2:30 slot for PCP today (07/28) Appt Reason: persistent cough

## 2021-12-26 NOTE — Telephone Encounter (Signed)
Patient scheduled.

## 2021-12-26 NOTE — Progress Notes (Signed)
Phone [phone] In person visit   Subjective:   Lisa Greene is a 81 y.o. year old very pleasant female patient who presents for/with See problem oriented charting Chief Complaint  Patient presents with   Cough    Pt states she had strep 10 days ago and still feels bad.   Past Medical History-  Patient Active Problem List   Diagnosis Date Noted   Frequent PVCs 04/14/2017    Priority: High   History of breast cancer- Breast cancer of lower-inner quadrant of left female breast 11/01/2015    Priority: High   Low vitamin B12 level 06/24/2016    Priority: Medium    CKD (chronic kidney disease), stage III ([place]) 10/22/2015    Priority: Medium    Asthma, moderate persistent, well-controlled 04/19/2014    Priority: Medium    Hypertension 11/25/2010    Priority: Medium    Hyperlipidemia 12/20/2006    Priority: Medium    Osteopenia 12/20/2006    Priority: Medium    Vasomotor rhinitis 05/10/2019    Priority: Low   Chronic allergic conjunctivitis 05/10/2019    Priority: Low   Aortic atherosclerosis ([place]) 11/23/2016    Priority: Low   Family history of abdominal aortic aneurysm 03/08/2015    Priority: Low   Eosinophilia 02/08/2009    Priority: Low   Allergic rhinitis 01/31/2008    Priority: Low   Pain in left ankle and joints of left foot 06/18/2021   COVID-19 02/25/2021    Medications- reviewed and updated Current Outpatient Medications  Medication Sig Dispense Refill   Albuterol Sulfate (PROAIR RESPICLICK) 993 (90 Base) MCG/ACT AEPB Inhale 1 puff into the lungs every 8 (eight) hours as needed (wheezing/shortness of breath).     atorvastatin (LIPITOR) 20 MG tablet Take 1 tablet (20 mg total) by mouth daily. 90 tablet 3   azelastine (OPTIVAR) 0.05 % ophthalmic solution      azithromycin (ZITHROMAX) 250 MG tablet Take 2 tabs on day 1, then 1 tab daily until finished 6 tablet 0   BREO ELLIPTA 200-25 MCG/INH AEPB Inhale 1 puff into the lungs daily.     calcium citrate  (CALCITRATE - DOSED IN MG ELEMENTAL CALCIUM) 950 (200 Ca) MG tablet Take 200 mg of elemental calcium by mouth daily.     cetirizine (ZYRTEC) 10 MG tablet Take 10 mg by mouth daily.     cholecalciferol (VITAMIN D) 1000 UNITS tablet Take 2,000 Units by mouth daily.     Cyanocobalamin (VITAMIN B 12 PO) Take 1,000 mg by mouth every other day.     fluticasone (FLONASE) 50 MCG/ACT nasal spray Place 1 spray into both nostrils daily as needed.      hydrochlorothiazide (MICROZIDE) 12.5 MG capsule Take 1 capsule (12.5 mg total) by mouth every Monday, Wednesday, and Friday. 45 capsule 3   irbesartan (AVAPRO) 150 MG tablet TAKE ONE TABLET BY MOUTH DAILY 90 tablet 3   metoprolol succinate (TOPROL-XL) 50 MG 24 hr tablet Take 1 tablet (50 mg total) by mouth daily. 90 tablet 3   montelukast (SINGULAIR) 10 MG tablet Take 1 tablet by mouth at bedtime.     No current facility-administered medications for this visit.     Objective:  BP 110/70   Pulse 68   Temp 98 F (36.7 C)   Ht 5' (1.524 m)   Wt 149 lb 6.4 oz (67.8 kg)   SpO2 97%   BMI 29.18 kg/m  Gen: NAD, resting comfortably Left maxillary sinus tenderness, some drainage  in pharynx, nasal turbinates erythematous- no tonsilar edema or exudate CV: RRR no murmurs rubs or gallops Lungs: CTAB no crackles, wheeze, rhonchi Ext: no edema Skin: warm, dry     Assessment and Plan   # Cough/sinus congestion S: Patient seen by Inda Coke 12/16/2021 with sore throat as well as cough.  Had tried Zyrtec prior to visit-symptoms were present for 4 days at time of visit-was treated with amoxicillin 500 mg twice daily for 10 days- finished yesterday. Sore throat has largely resolved but still feels slightly off  Persistent deeper cough with sputum production- whitish discoloration. Slightly winded and has lower energy than usual. No fever or chills. No sinus pain. Blowing clear drainage as well. Chest tightness when coughing but no chest pain otherwise. No  swelling in legs or calf pain. Not wheezing- doesn't seem to have affected her asthma.  A/P: Appears that strep throat has been appropriately treated.  You are having lingering cough, sinus congestion and you have tenderness in your left maxillary sinus as well as enlarged nasal turbinates and some drainage in the back of your throat-I am concerned about a bacterial sinusitis.  Typically I would use Augmentin but since you just were treated with amoxicillin for 10 days we opted to try azithromycin for 5 days-this also provides some respiratory coverage.  We strongly consider doxycycline but this could cause a rash when outdoors so we opted out of this option. -discussed some arrhythmia risk with azithromycin-she will remain on her metoprolol - We discussed specifically that azithromycin could treat walking pneumonia- ut does have some bacterial sinusitis resistance in our community -if no better by mid next week consider chest x-ray    #hypertension with white coat element/PVCs on metoprolol S: medication: irbesartan '150mg'$ , metoprolol 50 mg XR, hctz 12.5 mg MWF -Edema on amlodipine and with HCTZ had mild hyponatremia BP Readings from Last 3 Encounters:  12/26/21 110/70  12/16/21 120/60  07/30/21 (!) 158/78  A/P: well controlled- continue current meds- knows to avoid decongestants  Recommended follow up: Return for next already scheduled visit or sooner if needed. Future Appointments  Date Time Provider [place]  01/08/2022 11:00 AM Marin Olp, MD [facility]  11/26/2022  9:15 AM [facility]   Lab/Order associations:   ICD-10-CM   1. Bacterial sinusitis  J32.9    B96.89     2. Acute cough  R05.1     3. Primary hypertension  I10      Meds ordered this encounter  Medications   azithromycin (ZITHROMAX) 250 MG tablet    Sig: Take 2 tabs on day 1, then 1 tab daily until finished    Dispense:  6 tablet    Refill:  0   Return precautions advised.   Garret Reddish, MD

## 2021-12-30 ENCOUNTER — Telehealth: Payer: Self-pay | Admitting: Family Medicine

## 2021-12-30 NOTE — Telephone Encounter (Signed)
When would you like to work pt in?

## 2021-12-30 NOTE — Telephone Encounter (Signed)
Patient stated per Dr. Yong Channel if not any better Patient see Dr. Yong Channel this week. No openings available. Please advise.

## 2021-12-30 NOTE — Telephone Encounter (Signed)
From last note "-if no better by mid next week consider chest x-ray "  You can order chest x-ray under subacute cough

## 2021-12-31 ENCOUNTER — Other Ambulatory Visit: Payer: Self-pay

## 2021-12-31 ENCOUNTER — Ambulatory Visit (INDEPENDENT_AMBULATORY_CARE_PROVIDER_SITE_OTHER)
Admission: RE | Admit: 2021-12-31 | Discharge: 2021-12-31 | Disposition: A | Discharge status: Home / Self Care | Payer: Medicare HMO | Source: Ambulatory Visit | Attending: Family Medicine | Admitting: Family Medicine

## 2021-12-31 DIAGNOSIS — R052 Subacute cough: Secondary | ICD-10-CM

## 2021-12-31 DIAGNOSIS — R059 Cough, unspecified: Secondary | ICD-10-CM | POA: Diagnosis not present

## 2021-12-31 NOTE — Telephone Encounter (Signed)
Chest xray ordered and mychart message sent.

## 2022-01-08 ENCOUNTER — Ambulatory Visit: Payer: Medicare HMO | Admitting: Family Medicine

## 2022-01-08 ENCOUNTER — Ambulatory Visit (INDEPENDENT_AMBULATORY_CARE_PROVIDER_SITE_OTHER): Payer: Medicare HMO | Admitting: Family Medicine

## 2022-01-08 ENCOUNTER — Encounter: Payer: Self-pay | Admitting: Family Medicine

## 2022-01-08 VITALS — BP 124/62 | HR 61 | Temp 97.3°F | Ht 60.0 in | Wt 148.8 lb

## 2022-01-08 DIAGNOSIS — E785 Hyperlipidemia, unspecified: Secondary | ICD-10-CM | POA: Diagnosis not present

## 2022-01-08 DIAGNOSIS — N1832 Chronic kidney disease, stage 3b: Secondary | ICD-10-CM

## 2022-01-08 DIAGNOSIS — I10 Essential (primary) hypertension: Secondary | ICD-10-CM

## 2022-01-08 LAB — CBC WITH DIFFERENTIAL/PLATELET
Basophils Absolute: 0.1 10*3/uL (ref 0.0–0.1)
Basophils Relative: 2 % (ref 0.0–3.0)
Eosinophils Absolute: 0.3 10*3/uL (ref 0.0–0.7)
Eosinophils Relative: 4.9 % (ref 0.0–5.0)
HCT: 35.9 % — ABNORMAL LOW (ref 36.0–46.0)
Hemoglobin: 12.2 g/dL (ref 12.0–15.0)
Lymphocytes Relative: 21.7 % (ref 12.0–46.0)
Lymphs Abs: 1.2 10*3/uL (ref 0.7–4.0)
MCHC: 33.9 g/dL (ref 30.0–36.0)
MCV: 93.2 fl (ref 78.0–100.0)
Monocytes Absolute: 0.7 10*3/uL (ref 0.1–1.0)
Monocytes Relative: 12.2 % — ABNORMAL HIGH (ref 3.0–12.0)
Neutro Abs: 3.3 10*3/uL (ref 1.4–7.7)
Neutrophils Relative %: 59.2 % (ref 43.0–77.0)
Platelets: 303 10*3/uL (ref 150.0–400.0)
RBC: 3.85 Mil/uL — ABNORMAL LOW (ref 3.87–5.11)
RDW: 13.9 % (ref 11.5–15.5)
WBC: 5.6 10*3/uL (ref 4.0–10.5)

## 2022-01-08 LAB — COMPREHENSIVE METABOLIC PANEL
ALT: 16 U/L (ref 0–35)
AST: 23 U/L (ref 0–37)
Albumin: 4 g/dL (ref 3.5–5.2)
Alkaline Phosphatase: 63 U/L (ref 39–117)
BUN: 19 mg/dL (ref 6–23)
CO2: 28 mEq/L (ref 19–32)
Calcium: 9.2 mg/dL (ref 8.4–10.5)
Chloride: 98 mEq/L (ref 96–112)
Creatinine, Ser: 1.34 mg/dL — ABNORMAL HIGH (ref 0.40–1.20)
GFR: 37.19 mL/min — ABNORMAL LOW (ref >60.00)
Glucose, Bld: 98 mg/dL (ref 70–99)
Potassium: 4.4 mEq/L (ref 3.5–5.1)
Sodium: 132 mEq/L — ABNORMAL LOW (ref 135–145)
Total Bilirubin: 0.9 mg/dL (ref 0.2–1.2)
Total Protein: 6.5 g/dL (ref 6.0–8.3)

## 2022-01-08 NOTE — Patient Instructions (Addendum)
Flu shot- we should have these available within a month or two but please let us know if you get at outside pharmacy - consider covid shot in the fall (wait until at least October and that they have released new one)  Please stop by lab before you go If you have mychart- we will send your results within 3 business days of [place] receiving them.  If you do not have mychart- we will call you about results within 5 business days of [place] receiving them.  *please also note that you will see labs on mychart as soon as they post. I will later go in and write notes on them- will say "notes from Dr. Yong Channel"   Recommended follow up: Return in about 6 months (around 07/11/2022) for physical or sooner if needed.Schedule b4 you leave.

## 2022-01-08 NOTE — Progress Notes (Signed)
Phone [phone] In person visit   Subjective:   Lisa Greene is a 81 y.o. year old very pleasant female patient who presents for/with See problem oriented charting Chief Complaint  Patient presents with   Follow-up   Hyperlipidemia   Hypertension    Past Medical History-  Patient Active Problem List   Diagnosis Date Noted   Frequent PVCs 04/14/2017    Priority: High   History of breast cancer- Breast cancer of lower-inner quadrant of left female breast 11/01/2015    Priority: High   Low vitamin B12 level 06/24/2016    Priority: Medium    CKD (chronic kidney disease), stage III ([place]) 10/22/2015    Priority: Medium    Asthma, moderate persistent, well-controlled 04/19/2014    Priority: Medium    Hypertension 11/25/2010    Priority: Medium    Hyperlipidemia 12/20/2006    Priority: Medium    Osteopenia 12/20/2006    Priority: Medium    Vasomotor rhinitis 05/10/2019    Priority: Low   Chronic allergic conjunctivitis 05/10/2019    Priority: Low   Aortic atherosclerosis ([place]) 11/23/2016    Priority: Low   Family history of abdominal aortic aneurysm 03/08/2015    Priority: Low   Eosinophilia 02/08/2009    Priority: Low   Allergic rhinitis 01/31/2008    Priority: Low   Pain in left ankle and joints of left foot 06/18/2021   COVID-19 02/25/2021    Medications- reviewed and updated Current Outpatient Medications  Medication Sig Dispense Refill   Albuterol Sulfate (PROAIR RESPICLICK) 275 (90 Base) MCG/ACT AEPB Inhale 1 puff into the lungs every 8 (eight) hours as needed (wheezing/shortness of breath).     atorvastatin (LIPITOR) 20 MG tablet Take 1 tablet (20 mg total) by mouth daily. 90 tablet 3   azelastine (OPTIVAR) 0.05 % ophthalmic solution      azithromycin (ZITHROMAX) 250 MG tablet Take 2 tabs on day 1, then 1 tab daily until finished 6 tablet 0   BREO ELLIPTA 200-25 MCG/INH AEPB Inhale 1 puff into the lungs daily.     calcium citrate (CALCITRATE - DOSED IN  MG ELEMENTAL CALCIUM) 950 (200 Ca) MG tablet Take 200 mg of elemental calcium by mouth daily.     cetirizine (ZYRTEC) 10 MG tablet Take 10 mg by mouth daily.     cholecalciferol (VITAMIN D) 1000 UNITS tablet Take 2,000 Units by mouth daily.     Cyanocobalamin (VITAMIN B 12 PO) Take 1,000 mg by mouth every other day.     fluticasone (FLONASE) 50 MCG/ACT nasal spray Place 1 spray into both nostrils daily as needed.      hydrochlorothiazide (MICROZIDE) 12.5 MG capsule Take 1 capsule (12.5 mg total) by mouth every Monday, Wednesday, and Friday. 45 capsule 3   irbesartan (AVAPRO) 150 MG tablet TAKE ONE TABLET BY MOUTH DAILY 90 tablet 3   metoprolol succinate (TOPROL-XL) 50 MG 24 hr tablet Take 1 tablet (50 mg total) by mouth daily. 90 tablet 3   montelukast (SINGULAIR) 10 MG tablet Take 1 tablet by mouth at bedtime.     No current facility-administered medications for this visit.     Objective:  BP 124/62   Pulse 61   Temp (!) 97.3 F (36.3 C)   Ht 5' (1.524 m)   Wt 148 lb 12.8 oz (67.5 kg)   SpO2 98%   BMI 29.06 kg/m  Gen: NAD, resting comfortably CV: RRR no murmurs rubs or gallops Lungs: CTAB no crackles, wheeze, rhonchi  Ext: no edema Skin: warm, dry    Assessment and Plan   # Cough after recent respiratory illness  -Treated with azithromycin on 12/26/2021 without significant improvement for potential bacterial sinusitis (it would also cover walking pneumonia) due to left maxillary sinus tenderness and enlarged nasal turbinates-we held off on Augmentin because had recently been treated with amoxicillin for 10 days-hesitant about doxycycline due to outdoor activity -Chest x-ray on 12/31/2021 largely reassuring-mild cardiomegaly and moderate hiatal hernia noted  -Recommended considering course of prednisone- she reports was gradually improving so opted out- and now symptoms have resolved . Also energy was sapped from illness and is gradualy improving  #history of breast cancer- Malignant  neoplasm of lower-inner quadrant of left breast in female s/p lumpectomy and reexcision, estrogen receptor positive -patient now off tamoxifen due to recurrent UTI. Close follow-up  Dr. Donne Hazel- goes this year as last year. Prior saw Dr. Lindi Adie with oncology.  -had mammogram in July- we do not have records yet- ws told reassuring -told likely released this year 5 years out  #hyperlipidemia #aortic atherosclerosis- LDL goal under 70 ideally S: Medication:atorvastatin 20 mg  Lab Results  Component Value Date   CHOL 168 07/08/2021   HDL 75.00 07/08/2021   LDLCALC 74 07/08/2021   LDLDIRECT 59.0 10/17/2019   TRIG 97.0 07/08/2021   CHOLHDL 2 07/08/2021  A/P: HLD-reasonably well controlled- just slightly off of goal for aortic atherosclerosis (presumed stable)- continue current meds - will try to improve diet slightly  #hypertension with white coat element/PVCs on metoprolol S: medication: irbesartan '150mg'$ , metoprolol 50 mg XR -Edema on amlodipine and with HCTZ had mild hyponatremia Home readings #s: excellent at home-  home cuff previously verified.  BP Readings from Last 3 Encounters:  01/08/22 124/62  12/26/21 110/70  12/16/21 120/60  A/P: Controlled. Continue current medications.   #Chronic kidney disease stage III S: GFR is typically in the 40s range -Patient knows to avoid NSAIDs  -hold irbesartan if gets dehydrated A/P: has been stable- update cmp today   # Asthma/allergies- follows with Dr. Rush Landmark: Maintenance Medication: Memory Dance and singulair (astelin and flonase and zyrtec as needed as well) As needed medication: albuterol. Patient rarely using this - not even with recent illness  A/P: Controlled. Continue current medications. F/u with DR. Donneta Romberg in december   #Osteoporosis S: Last DEXA: Worsening bone density on 07/09/2020 and we discussed referral to endocrinology due to renal function being near border for several medications- she had opted out - didn't tolerate fosamax  in past- didn't feel well- hot flashes right after one pill  Calcium: '1200mg'$  (through diet ok) recommended - 600 through pill and some through diet Vitamin D: 2000 units a day recommended- she takes this  A/P: prior referral to [facility] endocrinology due to renal function being near border for several medications- have seen prolia used in similar situations by them- wants to wait until 2024 to repeat then reconsider prolia   # B12 deficiency S: Current treatment/medication (oral vs. IM):  b12 every other day now Lab Results  Component Value Date   VITAMINB12 >1504 (H) 07/08/2021  A/P: Controlled. Continue current medications.   Recommended follow up: Return in about 6 months (around 07/11/2022) for physical or sooner if needed.Schedule b4 you leave. Future Appointments  Date Time Provider [place]  11/26/2022  9:15 AM [facility]   Lab/Order associations:   ICD-10-CM   1. Hyperlipidemia, unspecified hyperlipidemia type  E78.5     2. Primary  hypertension  I10 CBC with Differential/Platelet    Comprehensive metabolic panel    3. Stage 3b chronic kidney disease ([place])  N18.32      Return precautions advised.  Garret Reddish, MD

## 2022-01-12 DIAGNOSIS — H52203 Unspecified astigmatism, bilateral: Secondary | ICD-10-CM | POA: Diagnosis not present

## 2022-01-12 DIAGNOSIS — H5203 Hypermetropia, bilateral: Secondary | ICD-10-CM | POA: Diagnosis not present

## 2022-01-12 DIAGNOSIS — Z961 Presence of intraocular lens: Secondary | ICD-10-CM | POA: Diagnosis not present

## 2022-01-12 DIAGNOSIS — H26491 Other secondary cataract, right eye: Secondary | ICD-10-CM | POA: Diagnosis not present

## 2022-02-05 DIAGNOSIS — H26491 Other secondary cataract, right eye: Secondary | ICD-10-CM | POA: Diagnosis not present

## 2022-02-23 ENCOUNTER — Encounter: Payer: Self-pay | Admitting: *Deleted

## 2022-03-13 DIAGNOSIS — D0512 Intraductal carcinoma in situ of left breast: Secondary | ICD-10-CM | POA: Diagnosis not present

## 2022-03-25 ENCOUNTER — Ambulatory Visit: Payer: Medicare HMO

## 2022-04-01 ENCOUNTER — Ambulatory Visit (INDEPENDENT_AMBULATORY_CARE_PROVIDER_SITE_OTHER): Payer: Medicare HMO

## 2022-04-01 DIAGNOSIS — Z23 Encounter for immunization: Secondary | ICD-10-CM | POA: Diagnosis not present

## 2022-04-13 ENCOUNTER — Ambulatory Visit (INDEPENDENT_AMBULATORY_CARE_PROVIDER_SITE_OTHER): Payer: Medicare HMO | Admitting: Family

## 2022-04-13 ENCOUNTER — Encounter: Payer: Self-pay | Admitting: Family

## 2022-04-13 VITALS — BP 180/75 | HR 58 | Temp 97.7°F | Ht 60.0 in | Wt 151.0 lb

## 2022-04-13 DIAGNOSIS — R519 Headache, unspecified: Secondary | ICD-10-CM

## 2022-04-13 DIAGNOSIS — R030 Elevated blood-pressure reading, without diagnosis of hypertension: Secondary | ICD-10-CM

## 2022-04-13 NOTE — Progress Notes (Signed)
Patient ID: Lisa Greene, female    DOB: November 02, 1940, 81 y.o.   MRN: [id number]  Chief Complaint  Patient presents with   Nausea   Headache    HPI:      Nausea & Headache: pt describes overall not feeling well, denies sinus pain, but has pressure or fever, states the nausea is mild, both sx started with 3 days ago, no vomiting, feels just before trying to eat. HA comes at different times, woke her up one night, relieved with 2 Tylenol. Denies  dizziness or constipation/diarrhea, no new meds recently.  Has year round allergies and has been out raking leaves & thinking this may be causing her sx, is using Flonase & Astelin nasal sprays bid along w/Zyrtec & Singulair qd.   Assessment & Plan:  1. Sinus headache - advised to increase Flonase to 2 sprays bid for next 3-5 days and see if helps sx. Drink at least 2 liters water qd. Denies need for Zofran, as nausea is very mild, and usually resolves after eating something light. Call back if sx are not resolving by end of week.  2. Elevated blood pressure reading BP is elevated, but pt states normal at home. Other office readings ok except at cardiology office. Taking Avapro & HCTZ qam as directed. Advised to monitor at home 1 hour after taking meds and prior to supper and call back if getting readings >140/90.   Subjective:    Outpatient Medications Prior to Visit  Medication Sig Dispense Refill   Albuterol Sulfate (PROAIR RESPICLICK) 431 (90 Base) MCG/ACT AEPB Inhale 1 puff into the lungs every 8 (eight) hours as needed (wheezing/shortness of breath).     atorvastatin (LIPITOR) 20 MG tablet Take 1 tablet (20 mg total) by mouth daily. 90 tablet 3   azelastine (OPTIVAR) 0.05 % ophthalmic solution      azithromycin (ZITHROMAX) 250 MG tablet Take 2 tabs on day 1, then 1 tab daily until finished 6 tablet 0   BREO ELLIPTA 200-25 MCG/INH AEPB Inhale 1 puff into the lungs daily.     calcium citrate (CALCITRATE - DOSED IN MG ELEMENTAL CALCIUM) 950  (200 Ca) MG tablet Take 200 mg of elemental calcium by mouth daily.     cetirizine (ZYRTEC) 10 MG tablet Take 10 mg by mouth daily.     cholecalciferol (VITAMIN D) 1000 UNITS tablet Take 2,000 Units by mouth daily.     Cyanocobalamin (VITAMIN B 12 PO) Take 1,000 mg by mouth every other day.     fluticasone (FLONASE) 50 MCG/ACT nasal spray Place 1 spray into both nostrils daily as needed.      hydrochlorothiazide (MICROZIDE) 12.5 MG capsule Take 1 capsule (12.5 mg total) by mouth every Monday, Wednesday, and Friday. 45 capsule 3   irbesartan (AVAPRO) 150 MG tablet TAKE ONE TABLET BY MOUTH DAILY 90 tablet 3   metoprolol succinate (TOPROL-XL) 50 MG 24 hr tablet Take 1 tablet (50 mg total) by mouth daily. 90 tablet 3   montelukast (SINGULAIR) 10 MG tablet Take 1 tablet by mouth at bedtime.     No facility-administered medications prior to visit.   Past Medical History:  Diagnosis Date   ALLERGIC RHINITIS 01/31/2008   Asthma    BCC (basal cell carcinoma)sup& nod 03/01/2017   right nostril   Breast cancer of lower-inner quadrant of left female breast ([place]) 11/01/2015   treated with lumpectomy and radiation   Cataract    Colon cancer screening 05/21/2014   No polyps  at age 53. Diverticulosis alone-no more colonoscopies.     DIVERTICULITIS, HX OF 09/07/2008   pt denies this    Eosinophilia 02/08/2009   Fibroid    HYPERLIPIDEMIA 12/20/2006   Hypertension    OSTEOPOROSIS 12/20/2006   PVC's (premature ventricular contractions)    SCC (squamous cell carcinoma) well diff 11/28/2013   left side chin   Vitamin B 12 deficiency    Past Surgical History:  Procedure Laterality Date   BREAST LUMPECTOMY WITH NEEDLE LOCALIZATION Left 01/03/2016   Procedure: BREAST re-excision LUMPECTOMY WITH NEEDLE LOCALIZATION;  Surgeon: Rolm Bookbinder, MD;  Location: [facility];  Service: General;  Laterality: Left;  BREAST re-excision LUMPECTOMY WITH NEEDLE LOCALIZATION   BREAST LUMPECTOMY WITH  RADIOACTIVE SEED LOCALIZATION Left 11/28/2015   Procedure: LEFT BREAST LUMPECTOMY WITH BRACKETED RADIOACTIVE SEED LOCALIZATION;  Surgeon: Rolm Bookbinder, MD;  Location: [facility];  Service: General;  Laterality: Left;   BREAST SURGERY     cysts removed x2   CATARACT EXTRACTION BILATERAL W/ ANTERIOR VITRECTOMY  2013   bilateral cataracts   COLONOSCOPY  2005   tics only    FOOT SURGERY     KNEE ARTHROSCOPY     left   NEUROMA SURGERY     x2 feet   thyroid duct cyst     Allergies  Allergen Reactions   Lisinopril Other (See Comments) and Cough    Dry cough and stinging rash   Latex Rash   Penicillins Rash    Has patient had a PCN reaction causing immediate rash, facial/tongue/throat swelling, SOB or lightheadedness with hypotension: Yes Has patient had a PCN reaction causing severe rash involving mucus membranes or skin necrosis: No Has patient had a PCN reaction that required hospitalization No Has patient had a PCN reaction occurring within the last 10 years: No If all of the above answers are "NO", then may proceed with Cephalosporin use.    Sulfonamide Derivatives Rash   Tape Rash    Band-aids break out skin!!      Objective:    Physical Exam Vitals and nursing note reviewed.  Constitutional:      Appearance: Normal appearance.  Cardiovascular:     Rate and Rhythm: Normal rate and regular rhythm.  Pulmonary:     Effort: Pulmonary effort is normal.     Breath sounds: Normal breath sounds.  Musculoskeletal:        General: Normal range of motion.  Skin:    General: Skin is warm and dry.  Neurological:     Mental Status: She is alert.  Psychiatric:        Mood and Affect: Mood normal.        Behavior: Behavior normal.    BP (!) 180/75 (BP Location: Left Arm, Patient Position: Sitting, Cuff Size: Large)   Pulse (!) 58   Temp 97.7 F (36.5 C) (Temporal)   Ht 5' (1.524 m)   Wt 151 lb (68.5 kg)   SpO2 97%   BMI 29.49 kg/m  Wt Readings from Last  3 Encounters:  04/13/22 151 lb (68.5 kg)  01/08/22 148 lb 12.8 oz (67.5 kg)  12/26/21 149 lb 6.4 oz (67.8 kg)       Jeanie Sewer, NP

## 2022-04-13 NOTE — Patient Instructions (Signed)
It was very nice to see you today!   Check your blood pressure 1 hour after taking your medications in the morning and then again prior to eating supper. Call or send a message via MyChart if readings are greater than 140/90, either number. Higher blood pressure could be contributing to your headaches.  Increase your Flonase nasal spray to 2 sprays each nostril twice a day for 3-4 days and see if this helps your headache.  Be sure you are drinking 2 liters of water every day!      PLEASE NOTE:  If you had any lab tests please let us know if you have not heard back within a few days. You may see your results on MyChart before we have a chance to review them but we will give you a call once they are reviewed by [place]. If we ordered any referrals today, please let us know if you have not heard from their office within the next week.

## 2022-04-15 ENCOUNTER — Encounter: Payer: Self-pay | Admitting: Family Medicine

## 2022-04-16 ENCOUNTER — Encounter: Payer: Self-pay | Admitting: Internal Medicine

## 2022-04-16 ENCOUNTER — Ambulatory Visit (INDEPENDENT_AMBULATORY_CARE_PROVIDER_SITE_OTHER): Payer: Medicare HMO | Admitting: Internal Medicine

## 2022-04-16 VITALS — BP 171/70 | HR 62 | Temp 97.1°F | Resp 12 | Ht 60.0 in | Wt 150.0 lb

## 2022-04-16 DIAGNOSIS — R5381 Other malaise: Secondary | ICD-10-CM

## 2022-04-16 DIAGNOSIS — R0989 Other specified symptoms and signs involving the circulatory and respiratory systems: Secondary | ICD-10-CM

## 2022-04-16 DIAGNOSIS — J309 Allergic rhinitis, unspecified: Secondary | ICD-10-CM | POA: Diagnosis not present

## 2022-04-16 DIAGNOSIS — R0981 Nasal congestion: Secondary | ICD-10-CM

## 2022-04-16 DIAGNOSIS — J328 Other chronic sinusitis: Secondary | ICD-10-CM

## 2022-04-16 LAB — POC COVID19 BINAXNOW: SARS Coronavirus 2 Ag: NEGATIVE

## 2022-04-16 MED ORDER — PREDNISONE 20 MG PO TABS: ORAL_TABLET | ORAL | 0 refills | Status: DC

## 2022-04-16 NOTE — Progress Notes (Signed)
[place] at [facility]:  [phone]   Routine Medical Office Visit  Patient:  Lisa Greene      Age: 81 y.o.       Sex:  female  Date:   04/16/2022  PCP:    Marin Olp, MD    [place] Provider: Loralee Pacas, MD  Assessment/Plan:   Garnetta was seen today for sinus congestion.  Allergic sinusitis -     predniSONE; Take 2 pills for 3 days, 1 pill for 4 days  Dispense: 10 tablet; Refill: 0  Sinus congestion  Malaise  Chest congestion  Other chronic sinusitis -     POC COVID-19 BinaxNow   Covid test negative.  I doubted it, but I wanted to check for COVID just to make sure that she knows whether the quarantine but the nasal exam was highly suspicious for severe allergies due to the extreme turbinate swelling with pale boggy mucosa and her history of severe allergies and she felt this was the most likely cause based on her history of treatment.  She already was on max treatment with saline rinses, Flonase, zyrtec, Singulair when this all happened and its persisting so will give prednisone burst since that's been helpful for this situation in past.    Possibility of chronic sinusitis needing antibiotics should be considered if she does not improve.  Will share this event with her Primary Care Provider (PCP) for consideration of forwarding to allergist or follow up mgmt recommendations.   Subjective:   ARDELLA Greene is a 81 y.o. female with past medical history including: Past Medical History:  Diagnosis Date   ALLERGIC RHINITIS 01/31/2008   Asthma    BCC (basal cell carcinoma)sup& nod 03/01/2017   right nostril   Breast cancer of lower-inner quadrant of left female breast ([place]) 11/01/2015   treated with lumpectomy and radiation   Cataract    Colon cancer screening 05/21/2014   No polyps at age 81. Diverticulosis alone-no more colonoscopies.     DIVERTICULITIS, HX OF 09/07/2008   pt denies this    Eosinophilia 02/08/2009   Fibroid     HYPERLIPIDEMIA 12/20/2006   Hypertension    OSTEOPOROSIS 12/20/2006   PVC's (premature ventricular contractions)    SCC (squamous cell carcinoma) well diff 11/28/2013   left side chin   Vitamin B 12 deficiency      She is presenting today with: Chief Complaint  Patient presents with   Sinus congestion    Intermittent, no fever for almost a week.    1 week for all symptoms, just chills, sinus congestion/pain and malaise. BP was a little high yesterday two Reports taking tylenol only for the symptoms Reports the symptoms are enough that she lies down or sits down and doesn't do anything for a while and she usually more active. Malaise ("feeling bad") is the most bothersome symptom History of severe allergies, eosinophilia, and asthma following with allergist- already on Singulair, Flonase. montelukast  Review of Systems  Constitutional:  Positive for chills and malaise/fatigue. Negative for diaphoresis, fever and weight loss.  HENT:  Positive for congestion and sinus pain. Negative for ear discharge, ear pain, hearing loss, nosebleeds, sore throat and tinnitus.   Eyes:  Negative for blurred vision, double vision, photophobia, pain, discharge and redness.  Respiratory:  Negative for cough, hemoptysis, sputum production, shortness of breath, wheezing and stridor.   Cardiovascular:  Negative for chest pain, palpitations, orthopnea, claudication, leg swelling and PND.  Gastrointestinal:  Negative for abdominal pain, blood in stool, constipation, diarrhea, heartburn, melena, nausea and vomiting.  Genitourinary:  Negative for dysuria, flank pain, frequency, hematuria and urgency.  Musculoskeletal:  Negative for back pain, falls, joint pain, myalgias and neck pain.  Skin:  Negative for itching and rash.  Neurological:  Negative for dizziness, tingling, tremors, sensory change, speech change, focal weakness, seizures, loss of consciousness, weakness and headaches.  Endo/Heme/Allergies:  Negative  for environmental allergies and polydipsia. Does not bruise/bleed easily.  Psychiatric/Behavioral:  Negative for depression, hallucinations, memory loss, substance abuse and suicidal ideas. The patient is not nervous/anxious and does not have insomnia.            Objective:  Physical Exam: BP (!) 171/70 (BP Location: Left Arm, Patient Position: Sitting)   Pulse 62   Temp (!) 97.1 F (36.2 C) (Temporal)   Resp 12   Ht 5' (1.524 m)   Wt 150 lb (68 kg)   SpO2 99%   BMI 29.29 kg/m   Problem-specific physical exam findings:  Physical Exam Vitals reviewed.  Constitutional:      General: She is not in acute distress.    Appearance: Normal appearance. She is not ill-appearing.     Comments: This is a very pleasant and polite person.    HENT:     Head: Normocephalic and atraumatic. No right periorbital erythema or left periorbital erythema.     Right Ear: Tympanic membrane, ear canal and external ear normal.     Left Ear: Tympanic membrane, ear canal and external ear normal.     Nose: Mucosal edema, congestion and rhinorrhea present. No nasal deformity, septal deviation, signs of injury, laceration or nasal tenderness. Rhinorrhea is clear.     Right Nostril: No foreign body, epistaxis, septal hematoma or occlusion.     Left Nostril: No foreign body, epistaxis, septal hematoma or occlusion.     Right Turbinates: Enlarged, swollen and pale.     Left Turbinates: Enlarged, swollen and pale.     Right Sinus: No frontal sinus tenderness.     Left Sinus: Maxillary sinus tenderness present. No frontal sinus tenderness.     Mouth/Throat:     Mouth: Mucous membranes are moist.  Eyes:     General: Lids are normal. Gaze aligned appropriately.     Conjunctiva/sclera: Conjunctivae normal.  Cardiovascular:     Rate and Rhythm: Normal rate and regular rhythm.     Heart sounds: Normal heart sounds. No murmur heard. Pulmonary:     Effort: Pulmonary effort is normal.     Breath sounds: Normal  breath sounds.  Abdominal:     Palpations: Abdomen is soft. There is no mass.     Tenderness: There is no guarding.  Skin:    General: Skin is warm and dry.     Coloration: Skin is not jaundiced.     Findings: No bruising.  Neurological:     General: No focal deficit present.     Mental Status: She is alert.  Psychiatric:        Mood and Affect: Mood normal.        Behavior: Behavior normal.        Thought Content: Thought content normal.        Judgment: Judgment normal.     Results for orders placed or performed in visit on 04/16/22  POC COVID-19  Result Value Ref Range   SARS Coronavirus 2 Ag Negative Negative

## 2022-04-16 NOTE — Telephone Encounter (Signed)
Pt was already scheduled with Randol Kern today at  11:20 am.

## 2022-04-16 NOTE — Patient Instructions (Signed)
It was a pleasure seeing you today! I truly hope you feel like you received 5 star service and please let me know if there is anything I can improve.  Loralee Pacas, MD   Today the plan is...  Allergic sinusitis -     predniSONE; Take 2 pills for 3 days, 1 pill for 4 days  Dispense: 10 tablet; Refill: 0  Sinus congestion  Malaise  Chest congestion  Other chronic sinusitis -     POC COVID-19 BinaxNow         '[x]'$  RETURN TO CLINIC:  only as needed   - If you are not doing well: RETURN to the office sooner. - Please bring all your medicines to each appointment.  - If your condition begins to worsen or become severe:  GO to the ER.

## 2022-04-27 ENCOUNTER — Encounter: Payer: Self-pay | Admitting: Obstetrics and Gynecology

## 2022-04-27 ENCOUNTER — Other Ambulatory Visit (HOSPITAL_COMMUNITY)
Admission: RE | Admit: 2022-04-27 | Discharge: 2022-04-27 | Disposition: A | Discharge status: Home / Self Care | Payer: Medicare HMO | Source: Ambulatory Visit | Attending: Obstetrics and Gynecology | Admitting: Obstetrics and Gynecology

## 2022-04-27 ENCOUNTER — Ambulatory Visit: Payer: Medicare HMO | Admitting: Obstetrics and Gynecology

## 2022-04-27 VITALS — BP 122/70 | HR 55 | Ht 61.75 in | Wt 151.0 lb

## 2022-04-27 DIAGNOSIS — R69 Illness, unspecified: Secondary | ICD-10-CM | POA: Diagnosis not present

## 2022-04-27 DIAGNOSIS — N950 Postmenopausal bleeding: Secondary | ICD-10-CM | POA: Diagnosis not present

## 2022-04-27 DIAGNOSIS — Z1151 Encounter for screening for human papillomavirus (HPV): Secondary | ICD-10-CM | POA: Insufficient documentation

## 2022-04-27 DIAGNOSIS — N882 Stricture and stenosis of cervix uteri: Secondary | ICD-10-CM | POA: Diagnosis not present

## 2022-04-27 DIAGNOSIS — Z01419 Encounter for gynecological examination (general) (routine) without abnormal findings: Secondary | ICD-10-CM | POA: Insufficient documentation

## 2022-04-27 MED ORDER — MISOPROSTOL 200 MCG PO TABS: ORAL_TABLET | ORAL | 0 refills | Status: DC

## 2022-04-27 NOTE — Progress Notes (Signed)
81 y.o. [place] Married White or Caucasian Not Hispanic or Latino female here for post menopausal spotting. About 1 week ago she noticed a little brown spotting when she wiped after voiding. No blood in her urine, no blood in her underwear. Not sexually active. No urinary c/o.   She c/o a one week h/o intermittent, mild bilateral lower abdomen aching. Up to a 1-2/10 in severity. No bowel c/o, normal BM qd.     No LMP recorded. Patient is postmenopausal.          Sexually active: No.  The current method of family planning is post menopausal status.    Exercising: No.   She is active  Smoker:  no  Health Maintenance: Pap:  6/28/ 2007 WNL  History of abnormal Pap:  Yes years ago  MMG:  12/11/20 Bi-rads 2 benign  Colonoscopy:  05-11-14 WNL  BMD:   07/10/20 Osteoporosis TDaP:  08-22-12 Gardasil: N/A   reports that she has never smoked. She has never used smokeless tobacco. She reports that she does not drink alcohol and does not use drugs.  Past Medical History:  Diagnosis Date   ALLERGIC RHINITIS 01/31/2008   Asthma    BCC (basal cell carcinoma)sup& nod 03/01/2017   right nostril   Breast cancer of lower-inner quadrant of left female breast ([place]) 11/01/2015   treated with lumpectomy and radiation   Cataract    Colon cancer screening 05/21/2014   No polyps at age 69. Diverticulosis alone-no more colonoscopies.     DIVERTICULITIS, HX OF 09/07/2008   pt denies this    Eosinophilia 02/08/2009   Fibroid    HYPERLIPIDEMIA 12/20/2006   Hypertension    OSTEOPOROSIS 12/20/2006   PVC's (premature ventricular contractions)    SCC (squamous cell carcinoma) well diff 11/28/2013   left side chin   Vitamin B 12 deficiency     Past Surgical History:  Procedure Laterality Date   BREAST LUMPECTOMY WITH NEEDLE LOCALIZATION Left 01/03/2016   Procedure: BREAST re-excision LUMPECTOMY WITH NEEDLE LOCALIZATION;  Surgeon: Rolm Bookbinder, MD;  Location: [facility];  Service: General;   Laterality: Left;  BREAST re-excision LUMPECTOMY WITH NEEDLE LOCALIZATION   BREAST LUMPECTOMY WITH RADIOACTIVE SEED LOCALIZATION Left 11/28/2015   Procedure: LEFT BREAST LUMPECTOMY WITH BRACKETED RADIOACTIVE SEED LOCALIZATION;  Surgeon: Rolm Bookbinder, MD;  Location: [facility];  Service: General;  Laterality: Left;   BREAST SURGERY     cysts removed x2   CATARACT EXTRACTION BILATERAL W/ ANTERIOR VITRECTOMY  2013   bilateral cataracts   COLONOSCOPY  2005   tics only    FOOT SURGERY     KNEE ARTHROSCOPY     left   NEUROMA SURGERY     x2 feet   thyroid duct cyst      Current Outpatient Medications  Medication Sig Dispense Refill   Albuterol Sulfate (PROAIR RESPICLICK) 914 (90 Base) MCG/ACT AEPB Inhale 1 puff into the lungs every 8 (eight) hours as needed (wheezing/shortness of breath).     atorvastatin (LIPITOR) 20 MG tablet Take 1 tablet (20 mg total) by mouth daily. 90 tablet 3   azelastine (OPTIVAR) 0.05 % ophthalmic solution      BREO ELLIPTA 200-25 MCG/INH AEPB Inhale 1 puff into the lungs daily.     calcium citrate (CALCITRATE - DOSED IN MG ELEMENTAL CALCIUM) 950 (200 Ca) MG tablet Take 200 mg of elemental calcium by mouth daily.     cetirizine (ZYRTEC) 10 MG tablet Take 10 mg by  mouth daily.     cholecalciferol (VITAMIN D) 1000 UNITS tablet Take 2,000 Units by mouth daily.     Cyanocobalamin (VITAMIN B 12 PO) Take 1,000 mg by mouth every other day.     fluticasone (FLONASE) 50 MCG/ACT nasal spray Place 1 spray into both nostrils daily as needed.      hydrochlorothiazide (MICROZIDE) 12.5 MG capsule Take 1 capsule (12.5 mg total) by mouth every Monday, Wednesday, and Friday. 45 capsule 3   irbesartan (AVAPRO) 150 MG tablet TAKE ONE TABLET BY MOUTH DAILY 90 tablet 3   metoprolol succinate (TOPROL-XL) 50 MG 24 hr tablet Take 1 tablet (50 mg total) by mouth daily. 90 tablet 3   montelukast (SINGULAIR) 10 MG tablet Take 1 tablet by mouth at bedtime.     No current  facility-administered medications for this visit.    Family History  Problem Relation Age of Onset   Brain cancer Mother    COPD Father    Heart disease Father        stent placed 75   Allergic rhinitis Father    COPD Brother        died of flu/double pna age 45   Healthy Brother        lives local- sees Dr. Yong Channel   Arthritis Brother        back issues   Colon cancer Neg Hx    Stomach cancer Neg Hx     Review of Systems  Genitourinary:  Positive for pelvic pain and vaginal bleeding.    Exam:   BP 122/70   Pulse (!) 55   Ht 5' 1.75" (1.568 m)   Wt 151 lb (68.5 kg)   SpO2 99%   BMI 27.84 kg/m   Weight change: '@WEIGHTCHANGE'$ @ Height:   Height: 5' 1.75" (156.8 cm)  Ht Readings from Last 3 Encounters:  04/27/22 5' 1.75" (1.568 m)  04/16/22 5' (1.524 m)  04/13/22 5' (1.524 m)    General appearance: alert, cooperative and appears stated age Abdomen: soft, non-tender; non distended,  no masses,  no organomegaly   Pelvic: External genitalia:  no lesions              Urethra:  normal appearing urethra with no masses, tenderness or lesions              Bartholins and Skenes: normal                 Vagina: atrophic appearing vagina with a small grade 2 cystocele.              Cervix: no lesions and stenotic               Bimanual Exam:  Uterus:  normal size, contour, position, consistency, mobility, non-tender              Adnexa: no mass, fullness, tenderness               Rectovaginal: Confirms               Anus:  normal sphincter tone, no lesions  Wandra Scot, RMA chaperoned for the exam.   1. Postmenopausal bleeding - Cytology - PAP - US PELVIS TRANSVAGINAL NON-OB (TV ONLY); Future -Possible endometrial biopsy under ultrasound guidance.  - misoprostol (CYTOTEC) 200 MCG tablet; Place 2 tablets vaginally 6-12 hours prior to your appointment.  Dispense: 2 tablet; Refill: 0  2. Cervical stenosis (uterine cervix) - misoprostol (CYTOTEC) 200 MCG tablet; Place 2  tablets vaginally 6-12 hours prior to your appointment.  Dispense: 2 tablet; Refill: 0

## 2022-04-28 ENCOUNTER — Ambulatory Visit (INDEPENDENT_AMBULATORY_CARE_PROVIDER_SITE_OTHER): Payer: Medicare HMO | Admitting: Family Medicine

## 2022-04-28 ENCOUNTER — Other Ambulatory Visit: Payer: Self-pay | Admitting: *Deleted

## 2022-04-28 ENCOUNTER — Encounter: Payer: Self-pay | Admitting: Family Medicine

## 2022-04-28 VITALS — BP 126/68 | HR 68 | Temp 97.4°F | Ht 61.75 in | Wt 151.6 lb

## 2022-04-28 DIAGNOSIS — N950 Postmenopausal bleeding: Secondary | ICD-10-CM

## 2022-04-28 DIAGNOSIS — B009 Herpesviral infection, unspecified: Secondary | ICD-10-CM

## 2022-04-28 MED ORDER — VALACYCLOVIR HCL 1 G PO TABS: 1000.0000 mg | ORAL_TABLET | Freq: Every day | ORAL | 0 refills | Status: DC

## 2022-04-28 NOTE — Progress Notes (Signed)
Subjective:     Patient ID: Lisa Greene, female    DOB: 1940/06/11, 81 y.o.   MRN: [id number]  Chief Complaint  Patient presents with   Herpes Zoster    Back on the right leg. X 2 weeks.    HPI "Shingles"-back of R leg for years-behind R knee-intermitt.  Takes a pill for it from derm.  Has derm but closed permanently.  Has had spots now for over 1 wk.  Itch and burn.  Not bad pain.  New spot on L buttock for 2 days now.   No f/c.  Just had course of pred.  Health Maintenance Due  Topic Date Due   MAMMOGRAM  12/11/2021   COVID-19 Vaccine (5 - 2023-24 season) 01/30/2022    Past Medical History:  Diagnosis Date   ALLERGIC RHINITIS 01/31/2008   Asthma    BCC (basal cell carcinoma)sup& nod 03/01/2017   right nostril   Breast cancer of lower-inner quadrant of left female breast ([place]) 11/01/2015   treated with lumpectomy and radiation   Cataract    Colon cancer screening 05/21/2014   No polyps at age 34. Diverticulosis alone-no more colonoscopies.     DIVERTICULITIS, HX OF 09/07/2008   pt denies this    Eosinophilia 02/08/2009   Fibroid    HYPERLIPIDEMIA 12/20/2006   Hypertension    OSTEOPOROSIS 12/20/2006   PVC's (premature ventricular contractions)    SCC (squamous cell carcinoma) well diff 11/28/2013   left side chin   Vitamin B 12 deficiency     Past Surgical History:  Procedure Laterality Date   BREAST LUMPECTOMY WITH NEEDLE LOCALIZATION Left 01/03/2016   Procedure: BREAST re-excision LUMPECTOMY WITH NEEDLE LOCALIZATION;  Surgeon: Rolm Bookbinder, MD;  Location: [facility];  Service: General;  Laterality: Left;  BREAST re-excision LUMPECTOMY WITH NEEDLE LOCALIZATION   BREAST LUMPECTOMY WITH RADIOACTIVE SEED LOCALIZATION Left 11/28/2015   Procedure: LEFT BREAST LUMPECTOMY WITH BRACKETED RADIOACTIVE SEED LOCALIZATION;  Surgeon: Rolm Bookbinder, MD;  Location: [facility];  Service: General;  Laterality: Left;   BREAST SURGERY     cysts  removed x2   CATARACT EXTRACTION BILATERAL W/ ANTERIOR VITRECTOMY  2013   bilateral cataracts   COLONOSCOPY  2005   tics only    FOOT SURGERY     KNEE ARTHROSCOPY     left   NEUROMA SURGERY     x2 feet   thyroid duct cyst      Outpatient Medications Prior to Visit  Medication Sig Dispense Refill   Albuterol Sulfate (PROAIR RESPICLICK) 628 (90 Base) MCG/ACT AEPB Inhale 1 puff into the lungs every 8 (eight) hours as needed (wheezing/shortness of breath).     atorvastatin (LIPITOR) 20 MG tablet Take 1 tablet (20 mg total) by mouth daily. 90 tablet 3   azelastine (OPTIVAR) 0.05 % ophthalmic solution      BREO ELLIPTA 200-25 MCG/INH AEPB Inhale 1 puff into the lungs daily.     calcium citrate (CALCITRATE - DOSED IN MG ELEMENTAL CALCIUM) 950 (200 Ca) MG tablet Take 200 mg of elemental calcium by mouth daily.     cetirizine (ZYRTEC) 10 MG tablet Take 10 mg by mouth daily.     cholecalciferol (VITAMIN D) 1000 UNITS tablet Take 2,000 Units by mouth daily.     Cyanocobalamin (VITAMIN B 12 PO) Take 1,000 mg by mouth every other day.     fluticasone (FLONASE) 50 MCG/ACT nasal spray Place 1 spray into both nostrils daily as needed.  hydrochlorothiazide (MICROZIDE) 12.5 MG capsule Take 1 capsule (12.5 mg total) by mouth every Monday, Wednesday, and Friday. 45 capsule 3   irbesartan (AVAPRO) 150 MG tablet TAKE ONE TABLET BY MOUTH DAILY 90 tablet 3   metoprolol succinate (TOPROL-XL) 50 MG 24 hr tablet Take 1 tablet (50 mg total) by mouth daily. 90 tablet 3   misoprostol (CYTOTEC) 200 MCG tablet Place 2 tablets vaginally 6-12 hours prior to your appointment. 2 tablet 0   montelukast (SINGULAIR) 10 MG tablet Take 1 tablet by mouth at bedtime.     No facility-administered medications prior to visit.    Allergies  Allergen Reactions   Lisinopril Other (See Comments) and Cough    Dry cough and stinging rash   Latex Rash   Penicillins Rash    Has patient had a PCN reaction causing immediate  rash, facial/tongue/throat swelling, SOB or lightheadedness with hypotension: Yes Has patient had a PCN reaction causing severe rash involving mucus membranes or skin necrosis: No Has patient had a PCN reaction that required hospitalization No Has patient had a PCN reaction occurring within the last 10 years: No If all of the above answers are "NO", then may proceed with Cephalosporin use.    Sulfonamide Derivatives Rash   Tape Rash    Band-aids break out skin!!   ROS neg/noncontributory except as noted HPI/below      Objective:     BP 126/68   Pulse 68   Temp (!) 97.4 F (36.3 C)   Ht 5' 1.75" (1.568 m)   Wt 151 lb 9.6 oz (68.8 kg)   SpO2 97%   BMI 27.95 kg/m  Wt Readings from Last 3 Encounters:  04/28/22 151 lb 9.6 oz (68.8 kg)  04/27/22 151 lb (68.5 kg)  04/16/22 150 lb (68 kg)    Physical Exam   Gen: WDWN NAD HEENT: NCAT, conjunctiva not injected, sclera nonicteric EXT:  no edema MSK: no gross abnormalities.  NEURO: A&O x3.  CN II-XII intact.  PSYCH: normal mood. Good eye contact Skin: L buttock at gluteal cleft-2 vessicles.   R post thigh-above knee-old dried cluster of vessicles on erythematous base.      Assessment & Plan:   Problem List Items Addressed This Visit       Other   HSV-1 (herpes simplex virus 1) infection - Primary   HSV-educated pt doubt shingles.  Same family of virus.  Need to do tx within 3 days of symptoms so too late for the R leg, but new spot for 24hrs on L buttock.  Valtrex '1000mg'$  daily x 5 days for each break out.  If occurring frequently, consider suppressive therapy.  Per pt, about every 3-4 months.    No orders of the defined types were placed in this encounter.   Wellington Hampshire, MD

## 2022-04-28 NOTE — Patient Instructions (Signed)
It was very nice to see you today!  Take the valtrex '1000mg'$  daily for 5 days when breaks out.  You will have some left over for next episode.  Need to start the meds within 3 days of symptoms.   PLEASE NOTE:  If you had any lab tests please let us know if you have not heard back within a few days. You may see your results on MyChart before we have a chance to review them but we will give you a call once they are reviewed by [place]. If we ordered any referrals today, please let us know if you have not heard from their office within the next week.   Please try these tips to maintain a healthy lifestyle:  Eat most of your calories during the day when you are active. Eliminate processed foods including packaged sweets (pies, cakes, cookies), reduce intake of potatoes, white bread, white pasta, and white rice. Look for whole grain options, oat flour or almond flour.  Each meal should contain half fruits/vegetables, one quarter protein, and one quarter carbs (no bigger than a computer mouse).  Cut down on sweet beverages. This includes juice, soda, and sweet tea. Also watch fruit intake, though this is a healthier sweet option, it still contains natural sugar! Limit to 3 servings daily.  Drink at least 1 glass of water with each meal and aim for at least 8 glasses per day  Exercise at least 150 minutes every week.

## 2022-04-29 ENCOUNTER — Ambulatory Visit (INDEPENDENT_AMBULATORY_CARE_PROVIDER_SITE_OTHER): Payer: Medicare HMO | Admitting: Obstetrics and Gynecology

## 2022-04-29 ENCOUNTER — Other Ambulatory Visit: Payer: Self-pay | Admitting: Obstetrics and Gynecology

## 2022-04-29 ENCOUNTER — Other Ambulatory Visit (HOSPITAL_COMMUNITY)
Admission: RE | Admit: 2022-04-29 | Discharge: 2022-04-29 | Disposition: A | Discharge status: Home / Self Care | Payer: Medicare HMO | Source: Ambulatory Visit | Attending: Obstetrics and Gynecology | Admitting: Obstetrics and Gynecology

## 2022-04-29 ENCOUNTER — Ambulatory Visit (INDEPENDENT_AMBULATORY_CARE_PROVIDER_SITE_OTHER): Payer: Medicare HMO

## 2022-04-29 ENCOUNTER — Encounter: Payer: Self-pay | Admitting: Obstetrics and Gynecology

## 2022-04-29 DIAGNOSIS — R350 Frequency of micturition: Secondary | ICD-10-CM

## 2022-04-29 DIAGNOSIS — N950 Postmenopausal bleeding: Secondary | ICD-10-CM

## 2022-04-29 DIAGNOSIS — N858 Other specified noninflammatory disorders of uterus: Secondary | ICD-10-CM | POA: Diagnosis not present

## 2022-04-29 DIAGNOSIS — R1030 Lower abdominal pain, unspecified: Secondary | ICD-10-CM

## 2022-04-29 DIAGNOSIS — N882 Stricture and stenosis of cervix uteri: Secondary | ICD-10-CM

## 2022-04-29 LAB — URINALYSIS, COMPLETE
Bilirubin Urine: NEGATIVE
Glucose, UA: NEGATIVE
Hyaline Cast: NONE SEEN /LPF
Ketones, ur: NEGATIVE
Nitrite: NEGATIVE
Protein, ur: NEGATIVE
RBC / HPF: NONE SEEN /HPF (ref 0–2)
Specific Gravity, Urine: 1.01 (ref 1.001–1.035)
pH: 6.5 (ref 5.0–8.0)

## 2022-04-29 NOTE — Progress Notes (Signed)
GYNECOLOGY  VISIT   HPI: 81 y.o.   Married White or Caucasian Not Hispanic or Latino  female   [id number] with No LMP recorded. Patient is postmenopausal.   here for [place] for PMB. She was pretreated with cytotec for the possibility of an endometrial biopsy.  She also c/o continued lower abdominal and lower back pain. She had some urinary frequency last night. She was non tender on abdominal and pelvic exam earlier this week.   GYNECOLOGIC HISTORY: No LMP recorded. Patient is postmenopausal. Contraception: PM Menopausal hormone therapy: none        OB History     Gravida  0   Para  0   Term  0   Preterm  0   AB  0   Living  0      SAB  0   IAB  0   Ectopic  0   Multiple  0   Live Births  0              Patient Active Problem List   Diagnosis Date Noted   HSV-1 (herpes simplex virus 1) infection 04/28/2022   Pain in left ankle and joints of left foot 06/18/2021   COVID-19 02/25/2021   Vasomotor rhinitis 05/10/2019   Chronic allergic conjunctivitis 05/10/2019   Frequent PVCs 04/14/2017   Aortic atherosclerosis ([place]) 11/23/2016   Low vitamin B12 level 06/24/2016   History of breast cancer- Breast cancer of lower-inner quadrant of left female breast 11/01/2015   CKD (chronic kidney disease), stage III ([place]) 10/22/2015   Family history of abdominal aortic aneurysm 03/08/2015   Asthma, moderate persistent, well-controlled 04/19/2014   Hypertension 11/25/2010   Eosinophilia 02/08/2009   Allergic rhinitis 01/31/2008   Hyperlipidemia 12/20/2006   Osteopenia 12/20/2006    Past Medical History:  Diagnosis Date   ALLERGIC RHINITIS 01/31/2008   Asthma    BCC (basal cell carcinoma)sup& nod 03/01/2017   right nostril   Breast cancer of lower-inner quadrant of left female breast ([place]) 11/01/2015   treated with lumpectomy and radiation   Cataract    Colon cancer screening 05/21/2014   No polyps at age 44. Diverticulosis alone-no more colonoscopies.      DIVERTICULITIS, HX OF 09/07/2008   pt denies this    Eosinophilia 02/08/2009   Fibroid    HYPERLIPIDEMIA 12/20/2006   Hypertension    OSTEOPOROSIS 12/20/2006   PVC's (premature ventricular contractions)    SCC (squamous cell carcinoma) well diff 11/28/2013   left side chin   Vitamin B 12 deficiency     Past Surgical History:  Procedure Laterality Date   BREAST LUMPECTOMY WITH NEEDLE LOCALIZATION Left 01/03/2016   Procedure: BREAST re-excision LUMPECTOMY WITH NEEDLE LOCALIZATION;  Surgeon: Rolm Bookbinder, MD;  Location: [facility];  Service: General;  Laterality: Left;  BREAST re-excision LUMPECTOMY WITH NEEDLE LOCALIZATION   BREAST LUMPECTOMY WITH RADIOACTIVE SEED LOCALIZATION Left 11/28/2015   Procedure: LEFT BREAST LUMPECTOMY WITH BRACKETED RADIOACTIVE SEED LOCALIZATION;  Surgeon: Rolm Bookbinder, MD;  Location: [facility];  Service: General;  Laterality: Left;   BREAST SURGERY     cysts removed x2   CATARACT EXTRACTION BILATERAL W/ ANTERIOR VITRECTOMY  2013   bilateral cataracts   COLONOSCOPY  2005   tics only    FOOT SURGERY     KNEE ARTHROSCOPY     left   NEUROMA SURGERY     x2 feet   thyroid duct cyst      Current Outpatient  Medications  Medication Sig Dispense Refill   Albuterol Sulfate (PROAIR RESPICLICK) 161 (90 Base) MCG/ACT AEPB Inhale 1 puff into the lungs every 8 (eight) hours as needed (wheezing/shortness of breath).     atorvastatin (LIPITOR) 20 MG tablet Take 1 tablet (20 mg total) by mouth daily. 90 tablet 3   azelastine (OPTIVAR) 0.05 % ophthalmic solution      BREO ELLIPTA 200-25 MCG/INH AEPB Inhale 1 puff into the lungs daily.     calcium citrate (CALCITRATE - DOSED IN MG ELEMENTAL CALCIUM) 950 (200 Ca) MG tablet Take 200 mg of elemental calcium by mouth daily.     cetirizine (ZYRTEC) 10 MG tablet Take 10 mg by mouth daily.     cholecalciferol (VITAMIN D) 1000 UNITS tablet Take 2,000 Units by mouth daily.     Cyanocobalamin  (VITAMIN B 12 PO) Take 1,000 mg by mouth every other day.     fluticasone (FLONASE) 50 MCG/ACT nasal spray Place 1 spray into both nostrils daily as needed.      hydrochlorothiazide (MICROZIDE) 12.5 MG capsule Take 1 capsule (12.5 mg total) by mouth every Monday, Wednesday, and Friday. 45 capsule 3   irbesartan (AVAPRO) 150 MG tablet TAKE ONE TABLET BY MOUTH DAILY 90 tablet 3   metoprolol succinate (TOPROL-XL) 50 MG 24 hr tablet Take 1 tablet (50 mg total) by mouth daily. 90 tablet 3   misoprostol (CYTOTEC) 200 MCG tablet Place 2 tablets vaginally 6-12 hours prior to your appointment. 2 tablet 0   montelukast (SINGULAIR) 10 MG tablet Take 1 tablet by mouth at bedtime.     valACYclovir (VALTREX) 1000 MG tablet Take 1 tablet (1,000 mg total) by mouth daily. 30 tablet 0   No current facility-administered medications for this visit.     ALLERGIES: Lisinopril, Latex, Penicillins, Sulfonamide derivatives, and Tape  Family History  Problem Relation Age of Onset   Brain cancer Mother    COPD Father    Heart disease Father        stent placed 67   Allergic rhinitis Father    COPD Brother        died of flu/double pna age 80   Healthy Brother        lives local- sees Dr. Yong Channel   Arthritis Brother        back issues   Colon cancer Neg Hx    Stomach cancer Neg Hx     Social History   Socioeconomic History   Marital status: Married    Spouse name: Not on file   Number of children: Not on file   Years of education: Not on file   Highest education level: Not on file  Occupational History   Occupation: retired  Tobacco Use   Smoking status: Never   Smokeless tobacco: Never  Vaping Use   Vaping Use: Never used  Substance and Sexual Activity   Alcohol use: No    Alcohol/week: 0.0 standard drinks of alcohol   Drug use: No   Sexual activity: Yes    Partners: Male    Birth control/protection: Post-menopausal  Other Topics Concern   Not on file  Social History Narrative   Married.  No kids- 2 step kids. 5 step grandkids.  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[employer])      Hobbies: beach, read, crochet, knit, watch tv   Social Determinants of Health   Financial Resource Strain: Low Risk  (11/18/2021)  Overall Financial Resource Strain (CARDIA)    Difficulty of Paying Living Expenses: Not hard at all  Food Insecurity: No Food Insecurity (11/18/2021)   Hunger Vital Sign    Worried About Running Out of Food in the Last Year: Never true    Ran Out of Food in the Last Year: Never true  Transportation Needs: No Transportation Needs (11/18/2021)   PRAPARE - Hydrologist (Medical): No    Lack of Transportation (Non-Medical): No  Physical Activity: Sufficiently Active (11/18/2021)   Exercise Vital Sign    Days of Exercise per Week: 6 days    Minutes of Exercise per Session: 30 min  Stress: No Stress Concern Present (11/18/2021)   [place]    Feeling of Stress : Not at all  Social Connections: Moderately Integrated (11/18/2021)   Social Connection and Isolation Panel [NHANES]    Frequency of Communication with Friends and Family: More than three times a week    Frequency of Social Gatherings with Friends and Family: More than three times a week    Attends Religious Services: More than 4 times per year    Active Member of [employer] or Organizations: No    Attends Archivist Meetings: Never    Marital Status: Married  Human resources officer Violence: Not At Risk (11/18/2021)   Humiliation, Afraid, Rape, and Kick questionnaire    Fear of Current or Ex-Partner: No    Emotionally Abused: No    Physically Abused: No    Sexually Abused: No    ROS  PHYSICAL EXAMINATION:    There were no vitals taken for this visit.    General appearance: alert, cooperative and appears stated age  Pelvic: External genitalia:  no lesions               Urethra:  normal appearing urethra with no masses, tenderness or lesions              Bartholins and Skenes: normal                 Vagina: normal appearing vagina with normal color and discharge, no lesions              Cervix: no lesions and stenotic  Pelvic ultrasound  Indications: PMP bleeding  Findings:  Uterus 4.55 x 2.38 x 3.13 cm, mid position  Endometrium 2.96 mm (1.34 + 1.61 mm, fluid in the cavity)  Cervical dilation and endometrial biopsy performed under ultrasound guidance.  Left ovary 1.94 x 0.91 x 0.90 cm  Right ovary 1.82 x 0.94 x 0.90 cm  No free fluid  Impression:  Small mid position uterus Small amount of fluid noted within the endometrial cavity, thin endometrium, no masses Bilaterally atrophic appearing endometrium.                The risks of endometrial biopsy were reviewed and a consent was obtained.  A speculum was placed in the vagina and the cervix was cleansed with betadine. A tenaculum was placed on the cervix and the cervix was dilated with the mini-dilators up to the #3 hagar dilator under ultrasound guidance. The pipelle was then placed into the endometrial cavity. The uterus sounded to ~5 cm. The endometrial biopsy was performed, taking care to get a representative sample, sampling 360 degrees of the uterine cavity. Minimal tissue was obtained. I was definitely in the endometrial cavity. The tenaculum and speculum were removed. There were  no complications.   Chaperone was present for exam.  1. Postmenopausal bleeding Fluid in endometrial cavity, no intracavitary masses, minimal sample on endometrial biopsy (definitely in her cavity on ultrasound) - Endometrial biopsy - Surgical pathology( [facility]/ [facility])  2. Cervical stenosis (uterine cervix) Needed cervical dilation  3. Urinary frequency - Urinalysis, Complete - Urine Culture  4. Lower abdominal pain Benign exam, no findings on u/s to explain her pain. If her urine culture is  negative she will f/u with her primary

## 2022-04-29 NOTE — Patient Instructions (Signed)

## 2022-04-30 LAB — CYTOLOGY - PAP
Comment: NEGATIVE
High risk HPV: NEGATIVE

## 2022-04-30 LAB — URINE CULTURE
MICRO NUMBER:: 14246011
SPECIMEN QUALITY:: ADEQUATE

## 2022-05-01 ENCOUNTER — Ambulatory Visit (INDEPENDENT_AMBULATORY_CARE_PROVIDER_SITE_OTHER): Payer: Medicare HMO | Admitting: Family Medicine

## 2022-05-01 ENCOUNTER — Encounter: Payer: Self-pay | Admitting: Family Medicine

## 2022-05-01 ENCOUNTER — Telehealth: Payer: Self-pay | Admitting: Family Medicine

## 2022-05-01 VITALS — BP 130/65 | HR 70 | Temp 98.0°F | Ht 61.75 in | Wt 150.6 lb

## 2022-05-01 DIAGNOSIS — J329 Chronic sinusitis, unspecified: Secondary | ICD-10-CM

## 2022-05-01 DIAGNOSIS — R102 Pelvic and perineal pain: Secondary | ICD-10-CM | POA: Diagnosis not present

## 2022-05-01 DIAGNOSIS — R1032 Left lower quadrant pain: Secondary | ICD-10-CM | POA: Diagnosis not present

## 2022-05-01 DIAGNOSIS — K5792 Diverticulitis of intestine, part unspecified, without perforation or abscess without bleeding: Secondary | ICD-10-CM

## 2022-05-01 DIAGNOSIS — B9689 Other specified bacterial agents as the cause of diseases classified elsewhere: Secondary | ICD-10-CM | POA: Diagnosis not present

## 2022-05-01 DIAGNOSIS — I10 Essential (primary) hypertension: Secondary | ICD-10-CM | POA: Diagnosis not present

## 2022-05-01 DIAGNOSIS — J309 Allergic rhinitis, unspecified: Secondary | ICD-10-CM

## 2022-05-01 LAB — POCT URINALYSIS DIPSTICK
Bilirubin, UA: NEGATIVE
Blood, UA: NEGATIVE
Glucose, UA: NEGATIVE
Ketones, UA: NEGATIVE
Leukocytes, UA: NEGATIVE
Nitrite, UA: NEGATIVE
Protein, UA: NEGATIVE
Spec Grav, UA: 1.01 (ref 1.010–1.025)
Urobilinogen, UA: 0.2 E.U./dL
pH, UA: 6.5 (ref 5.0–8.0)

## 2022-05-01 LAB — CBC WITH DIFFERENTIAL/PLATELET
Basophils Absolute: 0.1 10*3/uL (ref 0.0–0.1)
Basophils Relative: 1.3 % (ref 0.0–3.0)
Eosinophils Absolute: 0.3 10*3/uL (ref 0.0–0.7)
Eosinophils Relative: 4.6 % (ref 0.0–5.0)
HCT: 35.9 % — ABNORMAL LOW (ref 36.0–46.0)
Hemoglobin: 12.5 g/dL (ref 12.0–15.0)
Lymphocytes Relative: 16.8 % (ref 12.0–46.0)
Lymphs Abs: 1.2 10*3/uL (ref 0.7–4.0)
MCHC: 34.8 g/dL (ref 30.0–36.0)
MCV: 90.8 fl (ref 78.0–100.0)
Monocytes Absolute: 0.7 10*3/uL (ref 0.1–1.0)
Monocytes Relative: 10.1 % (ref 3.0–12.0)
Neutro Abs: 4.7 10*3/uL (ref 1.4–7.7)
Neutrophils Relative %: 67.2 % (ref 43.0–77.0)
Platelets: 253 10*3/uL (ref 150.0–400.0)
RBC: 3.96 Mil/uL (ref 3.87–5.11)
RDW: 12.9 % (ref 11.5–15.5)
WBC: 7 10*3/uL (ref 4.0–10.5)

## 2022-05-01 LAB — COMPREHENSIVE METABOLIC PANEL
ALT: 21 U/L (ref 0–35)
AST: 23 U/L (ref 0–37)
Albumin: 3.7 g/dL (ref 3.5–5.2)
Alkaline Phosphatase: 53 U/L (ref 39–117)
BUN: 15 mg/dL (ref 6–23)
CO2: 27 mEq/L (ref 19–32)
Calcium: 8.7 mg/dL (ref 8.4–10.5)
Chloride: 99 mEq/L (ref 96–112)
Creatinine, Ser: 1.26 mg/dL — ABNORMAL HIGH (ref 0.40–1.20)
GFR: 39.96 mL/min — ABNORMAL LOW (ref >60.00)
Glucose, Bld: 96 mg/dL (ref 70–99)
Potassium: 4 mEq/L (ref 3.5–5.1)
Sodium: 133 mEq/L — ABNORMAL LOW (ref 135–145)
Total Bilirubin: 0.8 mg/dL (ref 0.2–1.2)
Total Protein: 5.8 g/dL — ABNORMAL LOW (ref 6.0–8.3)

## 2022-05-01 LAB — SURGICAL PATHOLOGY

## 2022-05-01 MED ORDER — AMOXICILLIN-POT CLAVULANATE 875-125 MG PO TABS: 1.0000 | ORAL_TABLET | Freq: Two times a day (BID) | ORAL | 0 refills | Status: DC

## 2022-05-01 NOTE — Telephone Encounter (Signed)
Please make sure patient's concerns were addressed-I sent her a MyChart message "I usually am not able to check messages until after clinic and unfortunately I am just getting your message-even if I ordered a CT scan at this point it would not be able to be completed until next week-can you update me with how you are feeling on Monday and we can make a determination at that time?  If you have worsening symptoms you can always use the emergency department as a resource over the weekend.  I am certainly hoping you will feel better though  Thanks, Garret Reddish"

## 2022-05-01 NOTE — Telephone Encounter (Signed)
Pt States: -seen today 12/01 by PCP team -She declined an a CT Scan of lower abdomin. -She has changed her mind.   Pt Asks: -Will PCP team order the CT scan as discussed?    Pt Requests: -Call back with follow up.

## 2022-05-01 NOTE — Progress Notes (Signed)
Phone [phone] In person visit   Subjective:   Lisa Greene is a 81 y.o. year old very pleasant female patient who presents for/with See problem oriented charting Chief Complaint  Patient presents with   Pelvic Pain    Symptoms started Monday. She denies blood in urine.   Back Pain   Sinusitis   Past Medical History-  Patient Active Problem List   Diagnosis Date Noted   Frequent PVCs 04/14/2017    Priority: High   History of breast cancer- Breast cancer of lower-inner quadrant of left female breast 11/01/2015    Priority: High   Low vitamin B12 level 06/24/2016    Priority: Medium    CKD (chronic kidney disease), stage III ([place]) 10/22/2015    Priority: Medium    Asthma, moderate persistent, well-controlled 04/19/2014    Priority: Medium    Hypertension 11/25/2010    Priority: Medium    Hyperlipidemia 12/20/2006    Priority: Medium    Osteopenia 12/20/2006    Priority: Medium    Vasomotor rhinitis 05/10/2019    Priority: Low   Chronic allergic conjunctivitis 05/10/2019    Priority: Low   Aortic atherosclerosis ([place]) 11/23/2016    Priority: Low   Family history of abdominal aortic aneurysm 03/08/2015    Priority: Low   Eosinophilia 02/08/2009    Priority: Low   Allergic rhinitis 01/31/2008    Priority: Low   HSV-1 (herpes simplex virus 1) infection 04/28/2022   Pain in left ankle and joints of left foot 06/18/2021   COVID-19 02/25/2021    Medications- reviewed and updated Current Outpatient Medications  Medication Sig Dispense Refill   Albuterol Sulfate (PROAIR RESPICLICK) 588 (90 Base) MCG/ACT AEPB Inhale 1 puff into the lungs every 8 (eight) hours as needed (wheezing/shortness of breath).     amoxicillin-clavulanate (AUGMENTIN) 875-125 MG tablet Take 1 tablet by mouth 2 (two) times daily. 20 tablet 0   atorvastatin (LIPITOR) 20 MG tablet Take 1 tablet (20 mg total) by mouth daily. 90 tablet 3   azelastine (OPTIVAR) 0.05 % ophthalmic solution      BREO  ELLIPTA 200-25 MCG/INH AEPB Inhale 1 puff into the lungs daily.     calcium citrate (CALCITRATE - DOSED IN MG ELEMENTAL CALCIUM) 950 (200 Ca) MG tablet Take 200 mg of elemental calcium by mouth daily.     cetirizine (ZYRTEC) 10 MG tablet Take 10 mg by mouth daily.     cholecalciferol (VITAMIN D) 1000 UNITS tablet Take 2,000 Units by mouth daily.     Cyanocobalamin (VITAMIN B 12 PO) Take 1,000 mg by mouth every other day.     fluticasone (FLONASE) 50 MCG/ACT nasal spray Place 1 spray into both nostrils daily as needed.      hydrochlorothiazide (MICROZIDE) 12.5 MG capsule Take 1 capsule (12.5 mg total) by mouth every Monday, Wednesday, and Friday. 45 capsule 3   irbesartan (AVAPRO) 150 MG tablet TAKE ONE TABLET BY MOUTH DAILY 90 tablet 3   metoprolol succinate (TOPROL-XL) 50 MG 24 hr tablet Take 1 tablet (50 mg total) by mouth daily. 90 tablet 3   misoprostol (CYTOTEC) 200 MCG tablet Place 2 tablets vaginally 6-12 hours prior to your appointment. 2 tablet 0   montelukast (SINGULAIR) 10 MG tablet Take 1 tablet by mouth at bedtime.     valACYclovir (VALTREX) 1000 MG tablet Take 1 tablet (1,000 mg total) by mouth daily. 30 tablet 0   No current facility-administered medications for this visit.     Objective:  BP 130/65 Comment: most recent home readings  Pulse 70   Temp 98 F (36.7 C) (Temporal)   Ht 5' 1.75" (1.568 m)   Wt 150 lb 9.6 oz (68.3 kg)   SpO2 99%   BMI 27.77 kg/m  Gen: NAD, resting comfortably Bilateral frontal and maxillary sinus tenderness noted, nasal turbinates swollen and erythematous with clear discharge, oropharynx largely normal- mild drainge in pharynx CV: RRR no murmurs rubs or gallops Lungs: CTAB no crackles, wheeze, rhonchi Abdomen: soft/nontender except for LLQ pain (unable to reproduce back pain even with palpation/movement/straight leg raise)/nondistended/normal bowel sounds. No rebound or guarding.  Ext: no edema Skin: warm, dry  Results for orders placed or  performed in visit on 05/01/22 (from the past 24 hour(s))  POCT Urinalysis Dipstick     Status: Normal   Collection Time: 05/01/22  8:12 AM  Result Value Ref Range   Color, UA straw    Clarity, UA clear    Glucose, UA Negative Negative   Bilirubin, UA negative    Ketones, UA negative    Spec Grav, UA 1.010 1.010 - 1.025   Blood, UA negative    pH, UA 6.5 5.0 - 8.0   Protein, UA Negative Negative   Urobilinogen, UA 0.2 0.2 or 1.0 E.U./dL   Nitrite, UA negative    Leukocytes, UA Negative Negative   Appearance     Odor         Assessment and Plan   #Concern for UTI/pelvic pain radiating to back S: Patients symptoms started on monday. Pelvic pain and down into back. Pain up to 10/10, worse with movement. Temp up to 99 at home. Mild nausea- has bene going on for a minute. Of note had UA with hgb on dipstick but no RBC, leukocytes 1+ but wbc not elevated on microscopic, few bacteria but final urine culture showed no growth Complains of dysuria: no; polyuria: no; nocturia: no; urgency: no.  Symptoms are slightly improving.  ROS- no chills, vomiting, flank pain/CVA tenderness- more low back. No blood in urine.  A/P: UA not concerning for UTI. unliikely UTI. Will get culture to make sure- but did have 1 2 days ago - I do wonder about diverticulitis. Advised liquid diet for 2-3 days and advance slowly to softer foods. If bacterial element augmentin would likely help (for sinusitis) Offered CT scan for confirmation but she preferred to trial these conservative measures first and if fails to improve then we can obtain next week Patient to follow up if new or worsening symptoms or failure to improve- if significantly worsening over weekend seek care in emergency department -already had GYN evaluation so do not think GYN in origin- has biopsy pending but pain started prior to the biopsy so do not think related. See GYN notes  # Concern for sinusitis S:patient seen by Cherylann Parr, NP on  04/13/22 for nausea and headaches as well as just not feeling well overall starting 3 days prior.  Did admit to ongoing allergy issues /congestion and had been raking leaves-was using Flonase and Astelin along with Zyrtec and Singulair.  At time of visit Flonase was increased to 2 sprays twice daily for 3 to 5 days and advised aggressive hydration.  She was seen 3 days later by Dr. Randol Kern who prescribed a course of prednisone.  COVID test was negative at that time. No chest pain or shortness of breath at that time or now  Also recently diagnosed with HSV 1 on 04/20/2022 by Dr. Cherlynn Kaiser  for spots on her right leg and buttocks that have been recurrent over time-was given Valtrex on an as-needed basis  Mild improvement with prednisone but now worsening again- in total this has now been over 20 days. Having sinus headaches that come and go. Some clear discharge and runny nose as well. Temp up to 99.  A/P: Over 20 days of sinus pressure/congestion. Does have baseline allergies but on good regimen. I am concerned for a secondary bacterial infection. Will treat with 10 days of augmentin (has penicillin rash allergy but has tolerated augmentin in the past). Thankfully she sees allergist on the 7th and they can weigh in on allergic side- continue current meds for now.  Take augmentin with food  -GFR in 40s-adjust meds if needed- since GFR over 30- no adjustment for augmentin   #hypertension with white coat element/PVCs on metoprolol S: medication: irbesartan '150mg'$ , metoprolol 50 mg XR, hydrochlorothiazide 12.5 mg Monday Wednesday Friday -Edema on amlodipine and with HCTZ in the past had mild hyponatremia Home readings #s: 130/65 yesterday-  home cuff previously verified.  BP Readings from Last 3 Encounters:  05/01/22 130/65  04/28/22 126/68  04/27/22 122/70   A/P: elevated in office but remains controlled at home- continue current medicine.   # Asthma/allergies- follows with Dr. Rush Landmark: Maintenance  Medication: Memory Dance and singulair (astelin and flonase and zyrtec as needed as well- but using regularly now) As needed medication: albuterol. Hasn't needed lately A/P: allergies - not clear if that's cause of symptoms- but continue current meds ( I think bacterial sinusitis instead). Does not appear that asthma has flare-d monitor   Recommended follow up: Return for as needed for new, worsening, persistent symptoms. Future Appointments  Date Time Provider [place]  07/14/2022  9:00 AM Marin Olp, MD [facility]  11/26/2022  9:15 AM [facility]    Lab/Order associations:   ICD-10-CM   1. Pelvic pain  R10.2 POCT Urinalysis Dipstick    Urine Culture    CBC with Differential/Platelet    Comprehensive metabolic panel    2. Bacterial sinusitis  J32.9    B96.89     3. Diverticulitis  K57.92     4. Primary hypertension  I10     5. Allergic rhinitis, unspecified seasonality, unspecified trigger  J30.9       Meds ordered this encounter  Medications   amoxicillin-clavulanate (AUGMENTIN) 875-125 MG tablet    Sig: Take 1 tablet by mouth 2 (two) times daily.    Dispense:  20 tablet    Refill:  0    Return precautions advised.  Garret Reddish, MD

## 2022-05-01 NOTE — Patient Instructions (Addendum)
Over 20 days of sinus pressure/congestion. Does have baseline allergies but on good regimen. I am concerned for a secondary bacterial infection. Will treat with 10 days of augmentin (has penicillin rash allergy but has tolerated augmentin in the past). Thankfully she sees allergist on the 7th and they can weigh in on allergic side- continue current meds for now.   - I do wonder about diverticulitis. Advised liquid diet for 2-3 days and advance slowly to softer foods. If bacterial element augmentin would likely help. Offered CT scan for confirmation but she preferred to trial these conservative measures first and if fails to improve then we can obtain next week  Please stop by lab before you go If you have mychart- we will send your results within 3 business days of [place] receiving them.  If you do not have mychart- we will call you about results within 5 business days of [place] receiving them.  *please also note that you will see labs on mychart as soon as they post. I will later go in and write notes on them- will say "notes from Dr. Yong Channel"   Recommended follow up: Return for as needed for new, worsening, persistent symptoms.

## 2022-05-02 LAB — URINE CULTURE
MICRO NUMBER:: 14258167
SPECIMEN QUALITY:: ADEQUATE

## 2022-05-04 NOTE — Telephone Encounter (Signed)
Spoke to pt today when I gave results, please look at note there.

## 2022-05-05 DIAGNOSIS — J4540 Moderate persistent asthma, uncomplicated: Secondary | ICD-10-CM | POA: Diagnosis not present

## 2022-05-05 DIAGNOSIS — H1045 Other chronic allergic conjunctivitis: Secondary | ICD-10-CM | POA: Diagnosis not present

## 2022-05-05 DIAGNOSIS — J300 Vasomotor rhinitis: Secondary | ICD-10-CM | POA: Diagnosis not present

## 2022-05-05 DIAGNOSIS — J019 Acute sinusitis, unspecified: Secondary | ICD-10-CM | POA: Diagnosis not present

## 2022-05-05 NOTE — Addendum Note (Signed)
Addended by: Marin Olp on: 05/05/2022 06:14 PM   Modules accepted: Orders

## 2022-05-05 NOTE — Addendum Note (Signed)
Addended by: Bing Neighbors on: 05/05/2022 11:25 AM   Modules accepted: Orders

## 2022-05-05 NOTE — Telephone Encounter (Signed)
Spoke to pt

## 2022-05-05 NOTE — Telephone Encounter (Signed)
Patient states she is returning Zamari's call-requests to be called at ph# [phone]

## 2022-05-06 ENCOUNTER — Ambulatory Visit (HOSPITAL_COMMUNITY)
Admission: RE | Admit: 2022-05-06 | Discharge: 2022-05-06 | Disposition: A | Discharge status: Home / Self Care | Payer: Medicare HMO | Source: Ambulatory Visit | Attending: Family Medicine | Admitting: Family Medicine

## 2022-05-06 DIAGNOSIS — I700 Atherosclerosis of aorta: Secondary | ICD-10-CM | POA: Diagnosis not present

## 2022-05-06 DIAGNOSIS — R1032 Left lower quadrant pain: Secondary | ICD-10-CM | POA: Diagnosis not present

## 2022-05-06 MED ORDER — IOHEXOL 350 MG/ML SOLN
70.0000 mL | Freq: Once | INTRAVENOUS | Status: AC | PRN
  Administered 2022-05-06: 70 mL via INTRAVENOUS

## 2022-05-11 ENCOUNTER — Ambulatory Visit (INDEPENDENT_AMBULATORY_CARE_PROVIDER_SITE_OTHER): Payer: Medicare HMO | Admitting: Family Medicine

## 2022-05-11 VITALS — BP 162/86 | HR 66 | Ht 61.0 in | Wt 149.0 lb

## 2022-05-11 DIAGNOSIS — M545 Low back pain, unspecified: Secondary | ICD-10-CM

## 2022-05-11 MED ORDER — TIZANIDINE HCL 2 MG PO TABS: 2.0000 mg | ORAL_TABLET | Freq: Three times a day (TID) | ORAL | 1 refills | Status: DC | PRN

## 2022-05-11 NOTE — Patient Instructions (Addendum)
Thank you for coming in today.   Plan for I've referred you to Physical Therapy.  Let us know if you don't hear from them in one week.   Use the tizinadine for muscle spasm. Take mostly at bedtime.   Continue tylenol. Consider tylenol arthritis 2 pill every 8 hours.   Recheck in 1 month.   If worsening let me know sooner and I can order an MRI of the lumbar spine.   Come back or go to the emergency room if you notice new weakness new numbness problems walking or bowel or bladder problems.

## 2022-05-11 NOTE — Progress Notes (Signed)
   I, Peterson Lombard, LAT, ATC acting as a scribe for Lynne Leader, MD.  Subjective:    CC: Low back pain  HPI: Pt is an 81 y/o female c/o LBP x 3 weeks. No trauma or MOI, woke up Monday morning w/ LBP. Pt both sides of her low back, L>R. Pt states that she can hardly do any ADL's; standing to cook, putting her socks on.   Radiating pain: no LE numbness/tingling: no LE weakness: no Aggravates: forward trunk flexion, getting in/out of car, prolonged standing Treatments tried: Tylenol  Pertinent review of Systems: No fevers or chills.  No urinary symptoms.  Left low back and left pelvis pain.  Relevant historical information: History of breast cancer.   Objective:    Vitals:   05/11/22 1345  BP: (!) 162/86  Pulse: 66  SpO2: 99%   General: Well Developed, well nourished, and in no acute distress.   MSK: L-spine: Normal appearing Nontender midline.  Tender palpation left lumbar paraspinal musculature. Decreased lumbar motion. Lower extremity strength is intact. Reflexes are intact.  Left hip normal-appearing normal hip motion.  No pain with hip motion.  Lab and Radiology Results  CT scan images of L-spine and sacrum and hips visible on CT scan abdomen and pelvis obtained in May 06, 2022 personally and independently interpreted.  Mild degenerative changes and spondylosis at L4-5.  No acute fractures are visible. Mild bilateral hip DJD is present as well.   Impression and Recommendations:    Assessment and Plan: 82 y.o. female with significant left low back pain ongoing for about 3 weeks occurred without injury.  Pain thought to be primarily due to muscle spasm and dysfunction of the multifidus IR paraspinal muscles in the left side.  Stress fracture of the sacrum is possible but much less likely.  She also has some left anterior hip or left lower quadrant abdominal pain that is less well understood.  Plan for trial of physical therapy and tizanidine in addition to  Tylenol and heating pad.  If not improving or if worsening next step would be MRI lumbar spine.  Check back in 1 month.  Could proceed to MRI sooner if needed.Marland Kitchen  PDMP not reviewed this encounter. Orders Placed This Encounter  Procedures   Ambulatory referral to Physical Therapy    Referral Priority:   Routine    Referral Type:   Physical Medicine    Referral Reason:   Specialty Services Required    Requested Specialty:   Physical Therapy    Number of Visits Requested:   1   Meds ordered this encounter  Medications   tiZANidine (ZANAFLEX) 2 MG tablet    Sig: Take 1 tablet (2 mg total) by mouth every 8 (eight) hours as needed for muscle spasms.    Dispense:  30 tablet    Refill:  1    Discussed warning signs or symptoms. Please see discharge instructions. Patient expresses understanding.   The above documentation has been reviewed and is accurate and complete Lynne Leader, M.D.

## 2022-05-15 ENCOUNTER — Telehealth: Payer: Self-pay | Admitting: Family Medicine

## 2022-05-15 ENCOUNTER — Other Ambulatory Visit: Payer: Self-pay

## 2022-05-15 DIAGNOSIS — M545 Low back pain, unspecified: Secondary | ICD-10-CM

## 2022-05-15 NOTE — Telephone Encounter (Signed)
Patient called to let Dr Georgina Snell know that her back pain has gotten worse. She would like to know what next steps would be? MRI?  Please advise.

## 2022-05-15 NOTE — Telephone Encounter (Signed)
Pt called and discussed. LBP has worsened to the point where she is having difficulty standing and walking. No weakness or numbness/tingling in her legs. Pt has not yet been called to schedule PT. Advised to call the number provided on her AVS to schedule herself. L-spine MRI order placed.

## 2022-05-17 ENCOUNTER — Ambulatory Visit (INDEPENDENT_AMBULATORY_CARE_PROVIDER_SITE_OTHER): Payer: Medicare HMO

## 2022-05-17 DIAGNOSIS — M545 Low back pain, unspecified: Secondary | ICD-10-CM | POA: Diagnosis not present

## 2022-05-17 DIAGNOSIS — M48061 Spinal stenosis, lumbar region without neurogenic claudication: Secondary | ICD-10-CM | POA: Diagnosis not present

## 2022-05-17 DIAGNOSIS — M5126 Other intervertebral disc displacement, lumbar region: Secondary | ICD-10-CM | POA: Diagnosis not present

## 2022-05-18 ENCOUNTER — Ambulatory Visit: Payer: Medicare HMO | Admitting: Physical Therapy

## 2022-05-18 ENCOUNTER — Other Ambulatory Visit: Payer: Medicare HMO

## 2022-05-18 ENCOUNTER — Encounter: Payer: Self-pay | Admitting: Physical Therapy

## 2022-05-18 DIAGNOSIS — M5459 Other low back pain: Secondary | ICD-10-CM

## 2022-05-18 NOTE — Therapy (Signed)
OUTPATIENT PHYSICAL THERAPY THORACOLUMBAR EVALUATION   Patient Name: Lisa Greene MRN: [id number] DOB:1940/07/22, 81 y.o., female Today's Date: 05/18/2022  END OF SESSION:  PT End of Session - 05/18/22 1220     Visit Number 1    Number of Visits 16    Date for PT Re-Evaluation 07/13/22    Authorization Type [employer]    PT Start Time 1100    PT Stop Time 1140    PT Time Calculation (min) 40 min    Activity Tolerance Patient tolerated treatment well    Behavior During Therapy WFL for tasks assessed/performed             Past Medical History:  Diagnosis Date   ALLERGIC RHINITIS 01/31/2008   Asthma    BCC (basal cell carcinoma)sup& nod 03/01/2017   right nostril   Breast cancer of lower-inner quadrant of left female breast ([place]) 11/01/2015   treated with lumpectomy and radiation   Cataract    Colon cancer screening 05/21/2014   No polyps at age 87. Diverticulosis alone-no more colonoscopies.     DIVERTICULITIS, HX OF 09/07/2008   pt denies this    Eosinophilia 02/08/2009   Fibroid    HYPERLIPIDEMIA 12/20/2006   Hypertension    OSTEOPOROSIS 12/20/2006   PVC's (premature ventricular contractions)    SCC (squamous cell carcinoma) well diff 11/28/2013   left side chin   Vitamin B 12 deficiency    Past Surgical History:  Procedure Laterality Date   BREAST LUMPECTOMY WITH NEEDLE LOCALIZATION Left 01/03/2016   Procedure: BREAST re-excision LUMPECTOMY WITH NEEDLE LOCALIZATION;  Surgeon: Rolm Bookbinder, MD;  Location: [facility];  Service: General;  Laterality: Left;  BREAST re-excision LUMPECTOMY WITH NEEDLE LOCALIZATION   BREAST LUMPECTOMY WITH RADIOACTIVE SEED LOCALIZATION Left 11/28/2015   Procedure: LEFT BREAST LUMPECTOMY WITH BRACKETED RADIOACTIVE SEED LOCALIZATION;  Surgeon: Rolm Bookbinder, MD;  Location: [facility];  Service: General;  Laterality: Left;   BREAST SURGERY     cysts removed x2   CATARACT EXTRACTION BILATERAL W/  ANTERIOR VITRECTOMY  2013   bilateral cataracts   COLONOSCOPY  2005   tics only    FOOT SURGERY     KNEE ARTHROSCOPY     left   NEUROMA SURGERY     x2 feet   thyroid duct cyst     Patient Active Problem List   Diagnosis Date Noted   HSV-1 (herpes simplex virus 1) infection 04/28/2022   Pain in left ankle and joints of left foot 06/18/2021   COVID-19 02/25/2021   Vasomotor rhinitis 05/10/2019   Chronic allergic conjunctivitis 05/10/2019   Frequent PVCs 04/14/2017   Aortic atherosclerosis ([place]) 11/23/2016   Low vitamin B12 level 06/24/2016   History of breast cancer- Breast cancer of lower-inner quadrant of left female breast 11/01/2015   CKD (chronic kidney disease), stage III ([place]) 10/22/2015   Family history of abdominal aortic aneurysm 03/08/2015   Asthma, moderate persistent, well-controlled 04/19/2014   Hypertension 11/25/2010   Eosinophilia 02/08/2009   Allergic rhinitis 01/31/2008   Hyperlipidemia 12/20/2006   Osteopenia 12/20/2006    PCP: Garret Reddish  REFERRING PROVIDER: Lynne Leader  REFERRING DIAG:   Rationale for Evaluation and Treatment: Rehabilitation  THERAPY DIAG:  Other low back pain  ONSET DATE:   SUBJECTIVE:  SUBJECTIVE STATEMENT: Pt states insidious onset of pain at end of November, after thanksgiving. States some pain in L anterior hip, and in L low back.  R now better, with no pain.    Reports Constant pain, mild relief when sitting. Significant pain with Getting up, walking, change in positions.  Is able to sleep on R side.  Stairs:  Not in house, just has 2 stairs getting into house from garage, with hand rail.  Has had recent MRI, no results yet. Also had CT scan and [place] (both Neg)   PERTINENT HISTORY:    PAIN:  Are you having pain? Yes: NPRS scale:  10/10 Pain location: L side of back, (SI) , into L anterior  hip.  Pain description: Constant, sharp,  Aggravating factors: transitions, sit to stand, walking,  Relieving factors: none stated.   PRECAUTIONS: None  WEIGHT BEARING RESTRICTIONS: No  FALLS:  Has patient fallen in last 6 months? No  PLOF: Independent  PATIENT GOALS: decreased pain     OBJECTIVE:   DIAGNOSTIC FINDINGS:    COGNITION: Overall cognitive status: Within functional limits for tasks assessed     SENSATION:  POSTURE: No Significant postural limitations  PALPATION:  Pain in L SI, mild into L glute med.   LUMBAR ROM:   Hip ROM: WFL  AROM eval  Flexion Mod limitation  Extension Mod limitation  Right lateral flexion Mod limitation  Left lateral flexion Mod limitation  Right rotation   Left rotation    (Blank rows = not tested)   LOWER EXTREMITY MMT:    MMT Right eval Left eval  Hip flexion 4 4  Hip extension    Hip abduction 4 4  Hip adduction    Hip internal rotation    Hip external rotation    Knee flexion 5 5  Knee extension 5 5  Ankle dorsiflexion    Ankle plantarflexion    Ankle inversion    Ankle eversion     (Blank rows = not tested)  LUMBAR SPECIAL TESTS:  Neg Fabir/Fader, Neg SLR.     TODAY'S TREATMENT:                                                                                                                              DATE:  05/18/22:  Ther ex: see below for HEP  PATIENT EDUCATION:  Education details: PT POC, Exam findings, HEP Person educated: Patient Education method: Explanation, Demonstration, Tactile cues, Verbal cues, and Handouts Education comprehension: verbalized understanding, returned demonstration, verbal cues required, tactile cues required, and needs further education  HOME EXERCISE PROGRAM: Access Code: [email or web address] URL: [email or web address] Date: 05/18/2022 Prepared by: Lyndee Hensen  Exercises - Hooklying Single Knee  to Chest Stretch  - 2 x daily - 3 reps - 30 hold - Supine Piriformis Stretch Pulling Heel to Hip  - 2 x daily - 3 reps - 30 hold - Supine Posterior Pelvic Tilt  - 2 x daily -  1 sets - 10 reps - 3 hold - Seated Pelvic Tilt  - 1 x daily - 1 sets - 10 reps  ASSESSMENT:  CLINICAL IMPRESSION: Patient presents with primary complaint of increased pain in L low back. She has most pain to palpate L SI today. She also has pain in anterior L hip, tender with palpation, but not movement or testing. She is having significant pain levels and difficulty with mobility due to pain. She had recent MRI, results not available yet. Pt with no increased pain with light mobility and education on HEP today. Pt to benefit from skilled PT to improve deficits and pain.   OBJECTIVE IMPAIRMENTS: decreased activity tolerance, decreased mobility, difficulty walking, decreased strength, increased muscle spasms, impaired flexibility, and pain.   ACTIVITY LIMITATIONS: carrying, lifting, bending, sitting, standing, squatting, sleeping, stairs, transfers, dressing, reach over head, and locomotion level  PARTICIPATION LIMITATIONS: meal prep, cleaning, laundry, shopping, and community activity  PERSONAL FACTORS:  none  are also affecting patient's functional outcome.   REHAB POTENTIAL: Good  CLINICAL DECISION MAKING: Stable/uncomplicated  EVALUATION COMPLEXITY: Low   GOALS: Goals reviewed with patient? Yes  SHORT TERM GOALS: Target date: 06/01/2022    Pt to be independent with initial HEP  Goal status: INITIAL  2.  Pt to report decreased pain in L SI to 0-6/10   Goal status: INITIAL    LONG TERM GOALS: Target date: 07/13/2022   Pt to be independent with final HEP  Goal status: INITIAL  2.  Pt to report decreased pain in L low back and hip to 0-3/10 with transfers and activity   Goal status: INITIAL  3.  Pt to demo ability for sit to stand transfer without pain, to improve ability for IADLs.   Goal  status: INITIAL  4.  Pt to report ability for ambulation and/or standing activity for at least 10 minutes, with pain 0-5/10 to improve ability for ADLs and IADLs.   Goal status: INITIAL   PLAN:  PT FREQUENCY: 1-2x/week  PT DURATION: 8 weeks  PLANNED INTERVENTIONS: Therapeutic exercises, Therapeutic activity, Neuromuscular re-education, Balance training, Gait training, Patient/Family education, Self Care, Joint mobilization, Joint manipulation, Stair training, DME instructions, Dry Needling, Spinal manipulation, Spinal mobilization, Cryotherapy, Moist heat, Taping, Traction, Ultrasound, Ionotophoresis '4mg'$ /ml Dexamethasone, and Manual therapy.  PLAN FOR NEXT SESSION:    Lyndee Hensen, PT, DPT 12:43 PM  05/18/22

## 2022-05-20 ENCOUNTER — Other Ambulatory Visit: Payer: Self-pay

## 2022-05-20 ENCOUNTER — Telehealth: Payer: Self-pay | Admitting: Family Medicine

## 2022-05-20 ENCOUNTER — Encounter: Payer: Medicare HMO | Admitting: Physical Therapy

## 2022-05-20 DIAGNOSIS — S32020A Wedge compression fracture of second lumbar vertebra, initial encounter for closed fracture: Secondary | ICD-10-CM

## 2022-05-20 NOTE — Telephone Encounter (Signed)
Pt calling for MRI review, has seen in [facility]. Pt informed Dr. Georgina Snell out of the office.  Also, pt in pain. Has been taking OTC tylenol every 4 hours but not enough relief.

## 2022-05-20 NOTE — Telephone Encounter (Signed)
Order sent to [facility] for kyphoplasty. Called patient who agree with plan.

## 2022-05-21 ENCOUNTER — Other Ambulatory Visit: Payer: Self-pay | Admitting: Family Medicine

## 2022-05-21 ENCOUNTER — Ambulatory Visit
Admission: RE | Admit: 2022-05-21 | Discharge: 2022-05-21 | Disposition: A | Discharge status: Home / Self Care | Payer: Medicare HMO | Source: Ambulatory Visit | Attending: Family Medicine | Admitting: Family Medicine

## 2022-05-21 DIAGNOSIS — S32020A Wedge compression fracture of second lumbar vertebra, initial encounter for closed fracture: Secondary | ICD-10-CM

## 2022-05-21 DIAGNOSIS — M8008XA Age-related osteoporosis with current pathological fracture, vertebra(e), initial encounter for fracture: Secondary | ICD-10-CM | POA: Diagnosis not present

## 2022-05-21 HISTORY — PX: IR RADIOLOGIST EVAL & MGMT: IMG5224

## 2022-05-21 NOTE — Consult Note (Signed)
Chief Complaint: Patient was seen in consultation today for L2 compression fracture at the request of Smith,Zachary M  Referring Physician(s): Smith,Zachary M  History of Present Illness: Lisa Greene is a 81 y.o. female Who was in her usual state of good health until she awoke 1 morning in late November with new onset back pain.  After a fairly extensive workup, MRI imaging from 05/17/2022 confirmed a subacute compression fracture involving the superior endplate of L2.  This occurred spontaneously consistent with an osteoporotic insufficiency fracture.  She is here today with her husband.  Her pain is quite severe and registers a maximum of 10 out of 10 on a 10 point scale.  She is taking Tylenol every 4 hours for pain which helps take the edge off.  Her pain decreases to a 7-8 out of 10 after Tylenol.  She is unable to tolerate narcotic pain medicine.  She is currently not using a brace.  She is able to walk a little bit but needs to sit down or lay down frequently to rest.  Her pain is quite debilitating.  She scored a 16 out of 24 on the [employer] disability questionnaire.  She denies new lower extremity weakness, paresthesias or changes in bowel or bladder function.  Past Medical History:  Diagnosis Date   ALLERGIC RHINITIS 01/31/2008   Asthma    BCC (basal cell carcinoma)sup& nod 03/01/2017   right nostril   Breast cancer of lower-inner quadrant of left female breast ([place]) 11/01/2015   treated with lumpectomy and radiation   Cataract    Colon cancer screening 05/21/2014   No polyps at age 89. Diverticulosis alone-no more colonoscopies.     DIVERTICULITIS, HX OF 09/07/2008   pt denies this    Eosinophilia 02/08/2009   Fibroid    HYPERLIPIDEMIA 12/20/2006   Hypertension    OSTEOPOROSIS 12/20/2006   PVC's (premature ventricular contractions)    SCC (squamous cell carcinoma) well diff 11/28/2013   left side chin   Vitamin B 12 deficiency     Past Surgical History:   Procedure Laterality Date   BREAST LUMPECTOMY WITH NEEDLE LOCALIZATION Left 01/03/2016   Procedure: BREAST re-excision LUMPECTOMY WITH NEEDLE LOCALIZATION;  Surgeon: Rolm Bookbinder, MD;  Location: [facility];  Service: General;  Laterality: Left;  BREAST re-excision LUMPECTOMY WITH NEEDLE LOCALIZATION   BREAST LUMPECTOMY WITH RADIOACTIVE SEED LOCALIZATION Left 11/28/2015   Procedure: LEFT BREAST LUMPECTOMY WITH BRACKETED RADIOACTIVE SEED LOCALIZATION;  Surgeon: Rolm Bookbinder, MD;  Location: [facility];  Service: General;  Laterality: Left;   BREAST SURGERY     cysts removed x2   CATARACT EXTRACTION BILATERAL W/ ANTERIOR VITRECTOMY  2013   bilateral cataracts   COLONOSCOPY  2005   tics only    FOOT SURGERY     IR RADIOLOGIST EVAL & MGMT  05/21/2022   KNEE ARTHROSCOPY     left   NEUROMA SURGERY     x2 feet   thyroid duct cyst      Allergies: Lisinopril, Latex, Penicillins, Sulfonamide derivatives, and Tape  Medications: Prior to Admission medications   Medication Sig Start Date End Date Taking? Authorizing Provider  acetaminophen (TYLENOL) 500 MG tablet Take 1,000 mg by mouth every 4 (four) hours as needed.   Yes [provider]  Albuterol Sulfate (PROAIR RESPICLICK) 810 (90 Base) MCG/ACT AEPB Inhale 1 puff into the lungs every 8 (eight) hours as needed (wheezing/shortness of breath).   Yes [provider]  atorvastatin (LIPITOR) 20 MG tablet Take 1 tablet (20 mg total) by mouth daily. 07/08/21  Yes Marin Olp, MD  azelastine (OPTIVAR) 0.05 % ophthalmic solution  05/05/19  Yes [provider]  calcium citrate (CALCITRATE - DOSED IN MG ELEMENTAL CALCIUM) 950 (200 Ca) MG tablet Take 200 mg of elemental calcium by mouth daily.   Yes [provider]  cetirizine (ZYRTEC) 10 MG tablet Take 10 mg by mouth daily.   Yes [provider]  cholecalciferol (VITAMIN D) 1000 UNITS tablet Take 2,000 Units by mouth  daily.   Yes [provider]  Cyanocobalamin (VITAMIN B 12 PO) Take 1,000 mg by mouth every other day.   Yes [provider]  fluticasone (FLONASE) 50 MCG/ACT nasal spray Place 1 spray into both nostrils daily as needed.  10/08/14  Yes [provider]  hydrochlorothiazide (MICROZIDE) 12.5 MG capsule Take 1 capsule (12.5 mg total) by mouth every Monday, Wednesday, and Friday. 07/30/21  Yes Belva Crome, MD  irbesartan (AVAPRO) 150 MG tablet TAKE ONE TABLET BY MOUTH DAILY 09/29/21  Yes Marin Olp, MD  metoprolol succinate (TOPROL-XL) 50 MG 24 hr tablet Take 1 tablet (50 mg total) by mouth daily. 07/30/21  Yes Belva Crome, MD  montelukast (SINGULAIR) 10 MG tablet Take 1 tablet by mouth at bedtime. 08/30/15  Yes [provider]  tiZANidine (ZANAFLEX) 2 MG tablet Take 1 tablet (2 mg total) by mouth every 8 (eight) hours as needed for muscle spasms. 05/11/22  Yes Gregor Hams, MD  BREO ELLIPTA 200-25 MCG/INH AEPB Inhale 1 puff into the lungs daily. Patient not taking: Reported on 05/21/2022 12/13/19   [provider]  albuterol (PROVENTIL) 90 MCG/ACT inhaler Inhale 2 puffs into the lungs every 6 (six) hours as needed for wheezing. 09/23/10 08/20/11  Burnice Logan, MD  pantoprazole (PROTONIX) 40 MG tablet Take 1 tablet by mouth daily. 11/19/10 08/20/11  [provider]     Family History  Problem Relation Age of Onset   Brain cancer Mother    COPD Father    Heart disease Father        stent placed 52   Allergic rhinitis Father    COPD Brother        died of flu/double pna age 52   Healthy Brother        lives local- sees Dr. Yong Channel   Arthritis Brother        back issues   Colon cancer Neg Hx    Stomach cancer Neg Hx     Social History   Socioeconomic History   Marital status: Married    Spouse name: Not on file   Number of children: Not on file   Years of education: Not on file   Highest education level: Not on file  Occupational  History   Occupation: retired  Tobacco Use   Smoking status: Never   Smokeless tobacco: Never  Vaping Use   Vaping Use: Never used  Substance and Sexual Activity   Alcohol use: No    Alcohol/week: 0.0 standard drinks of alcohol   Drug use: No   Sexual activity: Yes    Partners: Male    Birth control/protection: Post-menopausal  Other Topics Concern   Not on file  Social History Narrative   Married. No kids- 2 step kids. 5 step grandkids.  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[employer])  Hobbies: beach, read, crochet, knit, watch tv   Social Determinants of Health   Financial Resource Strain: Low Risk  (11/18/2021)   Overall Financial Resource Strain (CARDIA)    Difficulty of Paying Living Expenses: Not hard at all  Food Insecurity: No Food Insecurity (11/18/2021)   Hunger Vital Sign    Worried About Running Out of Food in the Last Year: Never true    Ran Out of Food in the Last Year: Never true  Transportation Needs: No Transportation Needs (11/18/2021)   PRAPARE - Hydrologist (Medical): No    Lack of Transportation (Non-Medical): No  Physical Activity: Sufficiently Active (11/18/2021)   Exercise Vital Sign    Days of Exercise per Week: 6 days    Minutes of Exercise per Session: 30 min  Stress: No Stress Concern Present (11/18/2021)   [place]    Feeling of Stress : Not at all  Social Connections: Moderately Integrated (11/18/2021)   Social Connection and Isolation Panel [NHANES]    Frequency of Communication with Friends and Family: More than three times a week    Frequency of Social Gatherings with Friends and Family: More than three times a week    Attends Religious Services: More than 4 times per year    Active Member of [employer] or Organizations: No    Attends Archivist Meetings: Never    Marital Status: Married    Review  of Systems: A 12 point ROS discussed and pertinent positives are indicated in the HPI above.  All other systems are negative.  Review of Systems  Vital Signs: BP (!) 155/65 (BP Location: Left Arm, Patient Position: Sitting, Cuff Size: Normal)   Pulse 65   Temp 97.9 F (36.6 C) (Oral)   Resp 14   SpO2 98%     Physical Exam Constitutional:      Appearance: Normal appearance. She is normal weight.  HENT:     Head: Normocephalic and atraumatic.  Eyes:     General: No scleral icterus. Cardiovascular:     Rate and Rhythm: Normal rate.  Pulmonary:     Effort: Pulmonary effort is normal.  Abdominal:     General: There is no distension.  Musculoskeletal:       Back:     Comments: TTP at the L4 spinous process and also in the right paraspinal  muscles  Skin:    General: Skin is warm and dry.  Neurological:     Mental Status: She is alert and oriented to person, place, and time.  Psychiatric:        Mood and Affect: Mood normal.        Behavior: Behavior normal.       Imaging: IR Radiologist Eval & Mgmt  Result Date: 05/21/2022 EXAM: NEW PATIENT OFFICE VISIT CHIEF COMPLAINT: SEE EPIC NOTE HISTORY OF PRESENT ILLNESS: SEE EPIC NOTE REVIEW OF SYSTEMS: SEE EPIC NOTE PHYSICAL EXAMINATION: SEE EPIC NOTE ASSESSMENT AND PLAN: SEE EPIC NOTE Electronically Signed   By: Jacqulynn Cadet M.D.   On: 05/21/2022 13:50   MR LUMBAR SPINE WO CONTRAST  Result Date: 05/18/2022 CLINICAL DATA:  Lumbar spine pain for 1 month. Acute bilateral low back pain without sciatica EXAM: MRI LUMBAR SPINE WITHOUT CONTRAST TECHNIQUE: Multiplanar, multisequence MR imaging of the lumbar spine was performed. No intravenous contrast was administered. COMPARISON:  None Available. FINDINGS: Segmentation:  Standard. Alignment: Degenerative grade 1 anterolisthesis at L4-5. Mild  dextrocurvature Vertebrae: Horizontal band of marrow edema around the depressed superior endplate of L2. No aggressive bone lesion Conus  medullaris and cauda equina: Conus extends to the L1 level. Conus and cauda equina appear normal. Paraspinal and other soft tissues: Negative for perispinal mass or inflammation. Disc levels: T12- L1: Unremarkable. L1-L2: Disc desiccation and narrowing with mild bulge. Mild degenerative facet spurring. No neural compression L2-L3: Disc narrowing and bulging with small left inferior foraminal protrusion. L3-L4: Degenerative facet spurring and mild disc bulging. L4-L5: Advanced facet degeneration with spurring and anterolisthesis. Disc space narrowing and bulging with left foraminal annular fissure. L5-S1:Mild degenerative facet spurring. IMPRESSION: 1. Acute or subacute L2 compression fracture with mild superior endplate depression. 2. Lumbar spine degeneration especially at the L4-5 facets where there is anterolisthesis. Electronically Signed   By: Jorje Guild M.D.   On: 05/18/2022 20:41   US PELVIS TRANSVAGINAL NON-OB (TV ONLY)  Result Date: 05/07/2022 Pelvic ultrasound  Indications: PMP bleeding  Findings:  Uterus 4.55 x 2.38 x 3.13 cm, mid position  Endometrium 2.96 mm (1.34 + 1.61 mm, fluid in the cavity)  Cervical dilation and endometrial biopsy performed under ultrasound guidance.  Left ovary 1.94 x 0.91 x 0.90 cm  Right ovary 1.82 x 0.94 x 0.90 cm  No free fluid  Impression: Small mid position uterus Small amount of fluid noted within the endometrial cavity, thin endometrium, no masses Bilaterally atrophic appearing endometrium.   CT Abdomen Pelvis W Contrast  Result Date: 05/06/2022 CLINICAL DATA:  Left lower quadrant abdominal pain EXAM: CT ABDOMEN AND PELVIS WITH CONTRAST TECHNIQUE: Multidetector CT imaging of the abdomen and pelvis was performed using the standard protocol following bolus administration of intravenous contrast. RADIATION DOSE REDUCTION: This exam was performed according to the departmental dose-optimization program which includes automated exposure control, adjustment of the  mA and/or kV according to patient size and/or use of iterative reconstruction technique. CONTRAST:  56m OMNIPAQUE IOHEXOL 350 MG/ML SOLN COMPARISON:  04/22/2018 FINDINGS: Lower chest: No acute abnormality.  Small hiatal hernia. Hepatobiliary: No solid liver abnormality is seen. No gallstones, gallbladder wall thickening, or biliary dilatation. Pancreas: Unremarkable. No pancreatic ductal dilatation or surrounding inflammatory changes. Spleen: Normal in size without significant abnormality. Adrenals/Urinary Tract: Adrenal glands are unremarkable. Kidneys are normal, without renal calculi, solid lesion, or hydronephrosis. Bladder is unremarkable. Stomach/Bowel: Stomach is within normal limits. Appendix appears normal. No evidence of bowel wall thickening, distention, or inflammatory changes. Severe pancolonic diverticulosis, without evidence of acute diverticulitis. Vascular/Lymphatic: Aortic atherosclerosis. No enlarged abdominal or pelvic lymph nodes. Reproductive: No mass or other significant abnormality. Other: No abdominal wall hernia or abnormality. No ascites. Musculoskeletal: No acute or significant osseous findings. IMPRESSION: 1. No acute CT findings of the abdomen or pelvis to explain left lower quadrant abdominal pain. 2. Severe pancolonic diverticulosis, without evidence of acute diverticulitis. 3. Small hiatal hernia. Aortic Atherosclerosis (ICD10-I70.0). Electronically Signed   By: ADelanna AhmadiM.D.   On: 05/06/2022 13:59   UK[place]raoperative  Result Date: 04/29/2022 CLINICAL DATA:  Ultrasound was provided for use by the ordering physician.  No provider Interpretation or professional fees incurred.     Labs:  CBC: Recent Labs    07/08/21 1022 01/08/22 1200 05/01/22 0843  WBC 6.6 5.6 7.0  HGB 13.3 12.2 12.5  HCT 39.1 35.9* 35.9*  PLT 275.0 303.0 253.0    COAGS: No results for input(s): "INR", "APTT" in the last 8760 hours.  BMP: Recent Labs    07/08/21 1022 08/13/21 0950  01/08/22 1200 05/01/22 0843  NA 136 137 132* 133*  K 4.2 4.0 4.4 4.0  CL 103 100 98 99  CO2 '28 24 28 27  '$ GLUCOSE 93 163* 98 96  BUN '19 16 19 15  '$ CALCIUM 9.4 8.9 9.2 8.7  CREATININE 1.26* 1.33* 1.34* 1.26*    LIVER FUNCTION TESTS: Recent Labs    07/08/21 1022 01/08/22 1200 05/01/22 0843  BILITOT 0.8 0.9 0.8  AST '21 23 23  '$ ALT '19 16 21  '$ ALKPHOS 64 63 53  PROT 6.3 6.5 5.8*  ALBUMIN 4.1 4.0 3.7    TUMOR MARKERS: No results for input(s): "AFPTM", "CEA", "CA199", "CHROMGRNA" in the last 8760 hours.  Assessment & Plan:   Patient has suffered subacute osteoporotic fracture of the L2 vertebra.   History and exam have demonstrated the following:  Acute/Subacute fracture by imaging dated 05/17/22, Pain on exam concordant with level of fracture, Failure of conservative therapy and pain refractory to narcotic pain mediation, and Significant disability on the [place] with 16/24 positive symptoms, reflecting significant impact/impairment of (ADLs)   ICD-10-CM Codes that Support Medical Necessity ([email or web address])  M80.08XA    Age-related osteoporosis with current pathological fracture, vertebra(e), initial encounter for fracture   Plan:  L2 vertebral body augmentation with balloon kyphoplasty  Post-procedure disposition: outpatient [facility]  Medication holds: NA  The patient has suffered a fracture of the L2 vertebral body. It is recommended that patients aged 59 years or older be evaluated for possible testing or treatment of osteoporosis. A copy of this consult report is sent to the patient's referring physician.  Advanced Care Plan: The patient did not want to provide an [place] at the time of this visit     Total time spent on today's visit was over  40 Minutes  including both face-to-face time and non face-to-face time, personally spent on review of chart (including  labs and relevant imaging), discussing further workup and treatment options, referral to specialist if needed, reviewing outside records if pertinent, answering patient questions, and coordinating care regarding osteoporotic compression fracture of L2. as well as management strategy.   Electronically Signed: Criselda Peaches 05/21/2022, 2:19 PM

## 2022-05-22 NOTE — Progress Notes (Signed)
MRI shows a vertebral compression fracture which explains her pain.  Dr. Tamala Julian my partner referred you to interventional radiology who saw you yesterday and is already getting scheduled for a balloon kyphoplasty which will help significantly.  If you have any further questions or have any needs please let me know.

## 2022-05-28 ENCOUNTER — Ambulatory Visit
Admission: RE | Admit: 2022-05-28 | Discharge: 2022-05-28 | Disposition: A | Discharge status: Home / Self Care | Payer: Medicare HMO | Source: Ambulatory Visit | Attending: Family Medicine | Admitting: Family Medicine

## 2022-05-28 DIAGNOSIS — S32020A Wedge compression fracture of second lumbar vertebra, initial encounter for closed fracture: Secondary | ICD-10-CM

## 2022-05-28 DIAGNOSIS — M8008XA Age-related osteoporosis with current pathological fracture, vertebra(e), initial encounter for fracture: Secondary | ICD-10-CM | POA: Diagnosis not present

## 2022-05-28 HISTORY — PX: IR KYPHO LUMBAR INC FX REDUCE BONE BX UNI/BIL CANNULATION INC/IMAGING: IMG5519

## 2022-05-28 MED ORDER — ACETAMINOPHEN 10 MG/ML IV SOLN
1000.0000 mg | Freq: Once | INTRAVENOUS | Status: AC
  Administered 2022-05-28: 1000 mg via INTRAVENOUS

## 2022-05-28 MED ORDER — MIDAZOLAM HCL 2 MG/2ML IJ SOLN
1.0000 mg | INTRAMUSCULAR | Status: DC | PRN
  Administered 2022-05-28 (×2): 1 mg via INTRAVENOUS

## 2022-05-28 MED ORDER — VANCOMYCIN HCL IN DEXTROSE 1-5 GM/200ML-% IV SOLN
1000.0000 mg | INTRAVENOUS | Status: AC
  Administered 2022-05-28: 1000 mg via INTRAVENOUS

## 2022-05-28 MED ORDER — KETOROLAC TROMETHAMINE 30 MG/ML IJ SOLN: 30.0000 mg | Freq: Once | INTRAMUSCULAR | Status: DC

## 2022-05-28 MED ORDER — FENTANYL CITRATE PF 50 MCG/ML IJ SOSY
25.0000 ug | PREFILLED_SYRINGE | INTRAMUSCULAR | Status: DC | PRN
  Administered 2022-05-28 (×2): 50 ug via INTRAVENOUS

## 2022-05-28 MED ORDER — SODIUM CHLORIDE 0.9 % IV SOLN: INTRAVENOUS | Status: DC

## 2022-05-28 NOTE — Discharge Instructions (Signed)
Kyphoplasty Post Procedure Discharge Instructions  May resume a regular diet and any medications that you routinely take (including pain medications). However, if you are taking Aspirin or an anticoagulant/blood thinner you will be told when you can resume taking these by the healthcare provider. No driving day of procedure. The day of your procedure take it easy. You may use an ice pack as needed to injection sites on back.  Ice to back 30 minutes on and 30 minutes off, as needed. May remove bandaids tomorrow after taking a shower. Replace daily with a clean bandaid until healed.  Do not lift anything heavier than a milk jug for 1-2 weeks or determined by your physician.  Follow up with your physician in 2 weeks.    Please contact our office at [phone] for the following symptoms or if you have any questions:  Fever greater than 100 degrees Increased swelling, pain, or redness at injection site. Increased back and/or leg pain New numbness or change in symptoms from before the procedure.    Thank you for visiting [facility]. 

## 2022-05-28 NOTE — Progress Notes (Signed)
Pt back in nursing recovery area. Pt still drowsy from procedure but will wake up when spoken to. Pt follows commands, talks in complete sentences and has no complaints at this time. Pt will remain in nursing station until discharge.  ?

## 2022-05-29 ENCOUNTER — Encounter: Payer: Medicare HMO | Admitting: Physical Therapy

## 2022-06-04 DIAGNOSIS — L820 Inflamed seborrheic keratosis: Secondary | ICD-10-CM | POA: Diagnosis not present

## 2022-06-04 DIAGNOSIS — L538 Other specified erythematous conditions: Secondary | ICD-10-CM | POA: Diagnosis not present

## 2022-06-04 DIAGNOSIS — L298 Other pruritus: Secondary | ICD-10-CM | POA: Diagnosis not present

## 2022-06-04 DIAGNOSIS — Z85828 Personal history of other malignant neoplasm of skin: Secondary | ICD-10-CM | POA: Diagnosis not present

## 2022-06-04 DIAGNOSIS — L821 Other seborrheic keratosis: Secondary | ICD-10-CM | POA: Diagnosis not present

## 2022-06-04 DIAGNOSIS — R208 Other disturbances of skin sensation: Secondary | ICD-10-CM | POA: Diagnosis not present

## 2022-06-04 DIAGNOSIS — Z789 Other specified health status: Secondary | ICD-10-CM | POA: Diagnosis not present

## 2022-06-04 DIAGNOSIS — L814 Other melanin hyperpigmentation: Secondary | ICD-10-CM | POA: Diagnosis not present

## 2022-06-04 DIAGNOSIS — Z08 Encounter for follow-up examination after completed treatment for malignant neoplasm: Secondary | ICD-10-CM | POA: Diagnosis not present

## 2022-06-04 DIAGNOSIS — D225 Melanocytic nevi of trunk: Secondary | ICD-10-CM | POA: Diagnosis not present

## 2022-06-04 NOTE — Progress Notes (Signed)
Phone call to pt to follow up from her kyphoplasty on 05/28/22. Pt reports her pain is better but she still has some pain and soreness when walking a lot or riding in the car for longer periods, she states that she is still taking tylenol every four hours post procedure. Pt reports she is able to move around a little better. Pt denies any signs of infection, redness at the site, draining or fever. Pt has no complaints at this time and will be scheduled for a telephone follow up with Dr. Denna Haggard next week. Pt advised to call back if anything were to change or any concerns arise and we will arrange an in person appointment. Pt verbalized understanding.

## 2022-06-08 ENCOUNTER — Ambulatory Visit: Payer: Medicare HMO | Admitting: Family Medicine

## 2022-07-14 ENCOUNTER — Encounter: Payer: Self-pay | Admitting: Family Medicine

## 2022-07-14 ENCOUNTER — Ambulatory Visit (INDEPENDENT_AMBULATORY_CARE_PROVIDER_SITE_OTHER): Payer: Medicare HMO | Admitting: Family Medicine

## 2022-07-14 VITALS — BP 136/76 | HR 66 | Temp 97.3°F | Ht 61.0 in | Wt 146.6 lb

## 2022-07-14 DIAGNOSIS — I10 Essential (primary) hypertension: Secondary | ICD-10-CM

## 2022-07-14 DIAGNOSIS — N1832 Chronic kidney disease, stage 3b: Secondary | ICD-10-CM

## 2022-07-14 DIAGNOSIS — M810 Age-related osteoporosis without current pathological fracture: Secondary | ICD-10-CM | POA: Diagnosis not present

## 2022-07-14 DIAGNOSIS — E785 Hyperlipidemia, unspecified: Secondary | ICD-10-CM | POA: Diagnosis not present

## 2022-07-14 DIAGNOSIS — Z0000 Encounter for general adult medical examination without abnormal findings: Secondary | ICD-10-CM | POA: Diagnosis not present

## 2022-07-14 DIAGNOSIS — I700 Atherosclerosis of aorta: Secondary | ICD-10-CM

## 2022-07-14 DIAGNOSIS — R7989 Other specified abnormal findings of blood chemistry: Secondary | ICD-10-CM | POA: Diagnosis not present

## 2022-07-14 LAB — COMPREHENSIVE METABOLIC PANEL
ALT: 15 U/L (ref 0–35)
AST: 17 U/L (ref 0–37)
Albumin: 3.9 g/dL (ref 3.5–5.2)
Alkaline Phosphatase: 70 U/L (ref 39–117)
BUN: 17 mg/dL (ref 6–23)
CO2: 26 mEq/L (ref 19–32)
Calcium: 9.4 mg/dL (ref 8.4–10.5)
Chloride: 100 mEq/L (ref 96–112)
Creatinine, Ser: 1.26 mg/dL — ABNORMAL HIGH (ref 0.40–1.20)
GFR: 39.9 mL/min — ABNORMAL LOW (ref >60.00)
Glucose, Bld: 90 mg/dL (ref 70–99)
Potassium: 4.4 mEq/L (ref 3.5–5.1)
Sodium: 133 mEq/L — ABNORMAL LOW (ref 135–145)
Total Bilirubin: 1.1 mg/dL (ref 0.2–1.2)
Total Protein: 5.8 g/dL — ABNORMAL LOW (ref 6.0–8.3)

## 2022-07-14 LAB — CBC WITH DIFFERENTIAL/PLATELET
Basophils Absolute: 0.1 10*3/uL (ref 0.0–0.1)
Basophils Relative: 1.2 % (ref 0.0–3.0)
Eosinophils Absolute: 0.3 10*3/uL (ref 0.0–0.7)
Eosinophils Relative: 4.5 % (ref 0.0–5.0)
HCT: 37.6 % (ref 36.0–46.0)
Hemoglobin: 13 g/dL (ref 12.0–15.0)
Lymphocytes Relative: 24.1 % (ref 12.0–46.0)
Lymphs Abs: 1.5 10*3/uL (ref 0.7–4.0)
MCHC: 34.7 g/dL (ref 30.0–36.0)
MCV: 93 fl (ref 78.0–100.0)
Monocytes Absolute: 0.6 10*3/uL (ref 0.1–1.0)
Monocytes Relative: 10.1 % (ref 3.0–12.0)
Neutro Abs: 3.9 10*3/uL (ref 1.4–7.7)
Neutrophils Relative %: 60.1 % (ref 43.0–77.0)
Platelets: 309 10*3/uL (ref 150.0–400.0)
RBC: 4.04 Mil/uL (ref 3.87–5.11)
RDW: 13.9 % (ref 11.5–15.5)
WBC: 6.4 10*3/uL (ref 4.0–10.5)

## 2022-07-14 LAB — LIPID PANEL
Cholesterol: 148 mg/dL (ref 0–200)
HDL: 72.5 mg/dL (ref >39.00)
LDL Cholesterol: 61 mg/dL (ref 0–99)
NonHDL: 75.03
Total CHOL/HDL Ratio: 2
Triglycerides: 71 mg/dL (ref 0.0–149.0)
VLDL: 14.2 mg/dL (ref 0.0–40.0)

## 2022-07-14 LAB — VITAMIN B12: Vitamin B-12: 904 pg/mL (ref 211–911)

## 2022-07-14 LAB — VITAMIN D 25 HYDROXY (VIT D DEFICIENCY, FRACTURES): VITD: 41.28 ng/mL (ref 30.00–100.00)

## 2022-07-14 NOTE — Patient Instructions (Addendum)
Health Maintenance Due  Topic Date Due   MAMMOGRAM  12/11/2021  Sign release of information at the check out desk for mammogram from [facility]  I like the idea of RSV at pharmacy- please tell him an extra thank you for me for asking!   Please stop by lab before you go If you have mychart- we will send your results within 3 business days of [place] receiving them.  If you do not have mychart- we will call you about results within 5 business days of [place] receiving them.  *please also note that you will see labs on mychart as soon as they post. I will later go in and write notes on them- will say "notes from Dr. Yong Channel"   Schedule your bone density test at check out desk.  - located [address] across the [address] from [place] - in the basement - you DO NEED an appointment for the bone density tests.   Tons of great choices with cardiology- Dr. Johney Frame, [place], [place], [facility]- I honestly do not think you are going to make a bad choice   Recommended follow up: Return in about 6 months (around 01/12/2023) for followup or sooner if needed.Schedule b4 you leave.

## 2022-07-14 NOTE — Progress Notes (Signed)
Phone [phone]   Subjective:  Patient presents today for their annual physical. Chief complaint-noted.   See problem oriented charting- ROS- full  review of systems was completed and negative except for: post nasal drip, eye itching with seasonal allergies, back pain, food allergies, bruises easily  The following were reviewed and entered/updated in epic: Past Medical History:  Diagnosis Date   ALLERGIC RHINITIS 01/31/2008   Asthma    BCC (basal cell carcinoma)sup& nod 03/01/2017   right nostril   Breast cancer of lower-inner quadrant of left female breast ([place]) 11/01/2015   treated with lumpectomy and radiation   Cataract    Colon cancer screening 05/21/2014   No polyps at age 35. Diverticulosis alone-no more colonoscopies.     DIVERTICULITIS, HX OF 09/07/2008   pt denies this    Eosinophilia 02/08/2009   Fibroid    HYPERLIPIDEMIA 12/20/2006   Hypertension    OSTEOPOROSIS 12/20/2006   PVC's (premature ventricular contractions)    SCC (squamous cell carcinoma) well diff 11/28/2013   left side chin   Vitamin B 12 deficiency    Patient Active Problem List   Diagnosis Date Noted   Frequent PVCs 04/14/2017    Priority: High   History of breast cancer- Breast cancer of lower-inner quadrant of left female breast 11/01/2015    Priority: High   Low vitamin B12 level 06/24/2016    Priority: Medium    Stage 3b chronic kidney disease ([place]) 10/22/2015    Priority: Medium    Asthma, moderate persistent, well-controlled 04/19/2014    Priority: Medium    Hypertension 11/25/2010    Priority: Medium    Hyperlipidemia 12/20/2006    Priority: Medium    Osteoporosis 12/20/2006    Priority: Medium    HSV-1 (herpes simplex virus 1) infection 04/28/2022    Priority: Low   Vasomotor rhinitis 05/10/2019    Priority: Low   Chronic allergic conjunctivitis 05/10/2019    Priority: Low   Aortic atherosclerosis ([place]) 11/23/2016    Priority: Low   Family history of abdominal aortic aneurysm  03/08/2015    Priority: Low   Eosinophilia 02/08/2009    Priority: Low   Allergic rhinitis 01/31/2008    Priority: Low   Pain in left ankle and joints of left foot 06/18/2021   Past Surgical History:  Procedure Laterality Date   BREAST LUMPECTOMY WITH NEEDLE LOCALIZATION Left 01/03/2016   Procedure: BREAST re-excision LUMPECTOMY WITH NEEDLE LOCALIZATION;  Surgeon: Rolm Bookbinder, MD;  Location: [facility];  Service: General;  Laterality: Left;  BREAST re-excision LUMPECTOMY WITH NEEDLE LOCALIZATION   BREAST LUMPECTOMY WITH RADIOACTIVE SEED LOCALIZATION Left 11/28/2015   Procedure: LEFT BREAST LUMPECTOMY WITH BRACKETED RADIOACTIVE SEED LOCALIZATION;  Surgeon: Rolm Bookbinder, MD;  Location: [facility];  Service: General;  Laterality: Left;   BREAST SURGERY     cysts removed x2   CATARACT EXTRACTION BILATERAL W/ ANTERIOR VITRECTOMY  2013   bilateral cataracts   COLONOSCOPY  2005   tics only    FOOT SURGERY     IR KYPHO LUMBAR INC FX REDUCE BONE BX UNI/BIL CANNULATION INC/IMAGING  05/28/2022   IR RADIOLOGIST EVAL & MGMT  05/21/2022   KNEE ARTHROSCOPY     left   NEUROMA SURGERY     x2 feet   thyroid duct cyst      Family History  Problem Relation Age of Onset   Brain cancer Mother    COPD Father    Heart disease Father  stent placed 80   Allergic rhinitis Father    COPD Brother        died of flu/double pna age 68   Healthy Brother        lives local- sees Dr. Yong Channel   Arthritis Brother        back issues   Colon cancer Neg Hx    Stomach cancer Neg Hx     Medications- reviewed and updated Current Outpatient Medications  Medication Sig Dispense Refill   Albuterol Sulfate (PROAIR RESPICLICK) [id number] (90 Base) MCG/ACT AEPB Inhale 1 puff into the lungs every 8 (eight) hours as needed (wheezing/shortness of breath).     atorvastatin (LIPITOR) 20 MG tablet Take 1 tablet (20 mg total) by mouth daily. 90 tablet 3   azelastine (OPTIVAR) 0.05 %  ophthalmic solution      BREO ELLIPTA 200-25 MCG/INH AEPB Inhale 1 puff into the lungs daily.     calcium citrate (CALCITRATE - DOSED IN MG ELEMENTAL CALCIUM) 950 (200 Ca) MG tablet Take 200 mg of elemental calcium by mouth daily.     cetirizine (ZYRTEC) 10 MG tablet Take 10 mg by mouth daily.     cholecalciferol (VITAMIN D) 1000 UNITS tablet Take 2,000 Units by mouth daily.     Cyanocobalamin (VITAMIN B 12 PO) Take 1,000 mg by mouth every other day.     fluticasone (FLONASE) 50 MCG/ACT nasal spray Place 1 spray into both nostrils daily as needed.      hydrochlorothiazide (MICROZIDE) 12.5 MG capsule Take 1 capsule (12.5 mg total) by mouth every Monday, Wednesday, and Friday. 45 capsule 3   irbesartan (AVAPRO) 150 MG tablet TAKE ONE TABLET BY MOUTH DAILY 90 tablet 3   metoprolol succinate (TOPROL-XL) 50 MG 24 hr tablet Take 1 tablet (50 mg total) by mouth daily. 90 tablet 3   montelukast (SINGULAIR) 10 MG tablet Take 1 tablet by mouth at bedtime.     tiZANidine (ZANAFLEX) 2 MG tablet Take 1 tablet (2 mg total) by mouth every 8 (eight) hours as needed for muscle spasms. 30 tablet 1   acetaminophen (TYLENOL) 500 MG tablet Take 1,000 mg by mouth every 4 (four) hours as needed.     No current facility-administered medications for this visit.   Allergies-reviewed and updated Allergies  Allergen Reactions   Lisinopril Other (See Comments) and Cough    Dry cough and stinging rash   Latex Rash   Penicillins Rash    Has patient had a PCN reaction causing immediate rash, facial/tongue/throat swelling, SOB or lightheadedness with hypotension: Yes Has patient had a PCN reaction causing severe rash involving mucus membranes or skin necrosis: No Has patient had a PCN reaction that required hospitalization No Has patient had a PCN reaction occurring within the last 10 years: No If all of the above answers are "NO", then may proceed with Cephalosporin use.    Sulfonamide Derivatives Rash   Tape Rash     Band-aids break out skin!!    Social History   Social History Narrative   Married. No kids- 2 step kids. 5 step grandkids.  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 Oropharynx normal Neck: no thyromegaly CV: RRR no murmurs rubs or gallops Lungs: CTAB no crackles, wheeze, rhonchi Abdomen: soft/nontender/nondistended/normal bowel sounds. No rebound or guarding.  Ext: no edema Skin: warm, dry Neuro: grossly normal, moves all extremities, PERRLA   Assessment and Plan   82 y.o. female presenting for annual physical.  Health Maintenance counseling: 1. Anticipatory guidance: Patient counseled regarding regular dental exams -q12 months, eye exams - yearly,  avoiding smoking and second hand smoke , limiting alcohol to 1 beverage per day , no illicit drugs.   2. Risk factor reduction:  Advised patient of need for regular exercise and diet rich and fruits and vegetables to reduce risk of heart attack and stroke.  Exercise-exercise bike last year-but set back with her compression fracture- plans to start back. Doing some walking for bone density Diet/weight management-weight down 4 pounds from last year- lower appetite after surgery.  Wt Readings from Last 3 Encounters:  07/14/22 146 lb 9.6 oz (66.5 kg)  05/11/22 149 lb (67.6 kg)  05/01/22 150 lb 9.6 oz (68.3 kg)  3. Immunizations/screenings/ancillary studies-declines COVID for now, plans RSV at pharmacy Immunization History  Administered Date(s) Administered   Fluad Quad(high Dose 65+) 04/07/2019, 05/07/2020, 04/01/2022   Influenza Whole 03/02/2011   Influenza, High Dose Seasonal PF 05/04/2016, 05/05/2019, 04/15/2021   PFIZER(Purple  Top)SARS-COV-2 Vaccination 06/22/2019, 07/13/2019, 03/07/2020, 05/03/2020   Pneumococcal Conjugate-13 01/19/2007, 04/19/2014, 06/06/2015   Pneumococcal Polysaccharide-23 01/19/2007, 10/08/2014, 11/04/2015, 05/03/2017, 05/10/2018, 05/05/2019, 05/03/2020   Td 07/02/2002, 09/23/2017   Tdap 08/22/2012   Zoster Recombinat (Shingrix) 12/28/2018, 06/08/2019   Zoster, Live 02/10/2010  4. Cervical cancer screening- past age based screening recommendations- has seen Dr. Talbert Nan in the past 4 months for postmenopausal bleeding and had endometrial biopsy and CT- ended up being related to compression fracture 5. history of breast cancer- Malignant neoplasm of lower-inner quadrant of left breast in female s/p lumpectomy and reexcision, estrogen receptor positive -patient now off tamoxifen due to recurrent UTI. Close follow-up  Dr. Donne Hazel released her in 2023 but can come back if desired- she likely will. Prior saw Dr. Lindi Adie with oncology.  Mammogram July 2023  6. Colon cancer screening - may 2015 with no recall due to age.  No blood in stool or melena.  7. Skin cancer screening- Dr. 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 advised regular sunscreen use. Denies worrisome, changing, or new skin lesions.  8. Birth control/STD check- only active with husband and postmenopausal  9. Osteoporosis screening at 46- see below  10. Smoking associated screening - Never smoker  Status of chronic or acute concerns   #hyperlipidemia #aortic atherosclerosis- LDL goal under 70 ideally S: Medication:atorvastatin 20 mg  Lab Results  Component Value Date   CHOL 168 07/08/2021   HDL 75.00 07/08/2021   LDLCALC 74 07/08/2021   LDLDIRECT 59.0 10/17/2019   TRIG 97.0 07/08/2021   CHOLHDL 2 07/08/2021  A/P: HLD-close to ideal control likely continue current meds Aortic atherosclerosis (presumed stable)- LDL goal ideally <70. Did increase red meat some due to mild anemia- update labs but likely  continue current medications   #hypertension with white coat element/PVCs on metoprolol- sees cardiology (mild issues) S: medication: irbesartan 161m, metoprolol 50 mg XR, hydrochlorothiazide 12.5 mg Monday Wednesday Friday -Edema on amlodipine and with HCTZ in the past had mild hyponatremia Home readings #s: 120s-130s/70.  home cuff previously verified.  BP Readings from  Last 3 Encounters:  07/14/22 136/76  05/28/22 (!) 173/56  05/21/22 (!) 155/65  A/P: reasonable control in office and even better at home- continue current medications   #Chronic kidney disease stage III S: GFR is typically in the high 30s-40s range -Patient knows to avoid NSAIDs  -hold irbesartan if gets dehydrated A/P: hopefully stable- update cmp today. Continue current meds for now   # Asthma/allergies- follows with Dr. Rush Landmark: Maintenance Medication: Memory Dance and singulair (astelin and flonase and zyrtec as needed as well) As needed medication: albuterol. Patient is using this very rarely= has not used this year.  A/P: stable- continue current medicines    #Osteoporosis S: Last DEXA: Worsening bone density on 07/09/2020 and we discussed referral to [place] [facility] due to renal function being near border for several medications- have seen prolia used in similar situations by them- wants to wait until 2024 to repeat then reconsider prolia  -did not tolerate aledronate when tried- hot flashes and didn't feel well   -Recent compression fracture and lumbar spine  Calcium: 1220m (through diet ok) recommended - 600 through pill and some through diet Vitamin D: 1000 units a day recommended- she takes this Last vitamin D- normal January 2022  Last vitamin D Lab Results  Component Value Date   VD25OH 40.78 06/28/2020  A/P: bone density low and with recent compression fracture likely needs prolia- she wants to update her dexa first and then we can decide plus get updated bloodwork   # B12 deficiency S: Current  treatment/medication (oral vs. IM):  b12 every other day now Lab Results  Component Value Date   VITAMINB12 >1504 (H) 07/08/2021  A/P: hopefully stable- update b12 today. Continue current meds for now  Recommended follow up: Return in about 6 months (around 01/12/2023) for followup or sooner if needed.Schedule b4 you leave. Future Appointments  Date Time Provider DNenahnezad 07/31/2022  3:10 PM DMarylu Lund, NP [facility]  11/26/2022  9:15 AM [facility]   Lab/Order associations: fasting   ICD-10-CM   1. Preventative health care  Z00.00     2. Hyperlipidemia, unspecified hyperlipidemia type  E78.5     3. Primary hypertension  I10     4. Low vitamin B12 level  R79.89     5. Age-related osteoporosis without current pathological fracture  M81.0     6. Aortic atherosclerosis (HCC)  I70.0     7. Stage 3b chronic kidney disease ([facility] N18.32       No orders of the defined types were placed in this encounter.   Return precautions advised.  SGarret Reddish MD

## 2022-07-15 ENCOUNTER — Ambulatory Visit (INDEPENDENT_AMBULATORY_CARE_PROVIDER_SITE_OTHER)
Admission: RE | Admit: 2022-07-15 | Discharge: 2022-07-15 | Disposition: A | Discharge status: Home / Self Care | Payer: Medicare HMO | Source: Ambulatory Visit | Attending: Family Medicine | Admitting: Family Medicine

## 2022-07-15 DIAGNOSIS — M810 Age-related osteoporosis without current pathological fracture: Secondary | ICD-10-CM

## 2022-07-17 ENCOUNTER — Encounter: Payer: Self-pay | Admitting: Family Medicine

## 2022-07-21 ENCOUNTER — Other Ambulatory Visit: Payer: Self-pay

## 2022-07-21 DIAGNOSIS — M810 Age-related osteoporosis without current pathological fracture: Secondary | ICD-10-CM

## 2022-07-24 ENCOUNTER — Other Ambulatory Visit (HOSPITAL_COMMUNITY): Payer: Self-pay

## 2022-07-24 ENCOUNTER — Telehealth: Payer: Self-pay | Admitting: Family Medicine

## 2022-07-24 NOTE — Telephone Encounter (Signed)
Pharmacy Patient Advocate Encounter  Insurance verification completed.    The patient is insured through [employer] test claims for: Prolia '60mg'$ .  Pharmacy benefit copay: $100.00

## 2022-07-24 NOTE — Telephone Encounter (Signed)
Prolia VOB initiated via [email or web address] 

## 2022-07-24 NOTE — Telephone Encounter (Signed)
Caller states: - Patient called them to see how much prolia injection would cost out of pocket  - She was unable to generate an estimate due to not having a CPT code  - Patient also wanted to know where she would get this administered   Caller requests: - She be called back with the CPT code so she can give patient an estimate  - Patient be informed of the CPT code and where she can have this done  Apolonio Schneiders can be reached at [phone]

## 2022-07-27 NOTE — Telephone Encounter (Signed)
PA pending via [employer]  Authorization Number : [id number]

## 2022-07-27 NOTE — Telephone Encounter (Signed)
Patient called to check if prolia was at PCP office or not. Can we confirm this as well as what patient out of pocket cost would be?

## 2022-07-29 ENCOUNTER — Ambulatory Visit (INDEPENDENT_AMBULATORY_CARE_PROVIDER_SITE_OTHER): Payer: Medicare HMO | Admitting: Family Medicine

## 2022-07-29 ENCOUNTER — Encounter: Payer: Self-pay | Admitting: Family Medicine

## 2022-07-29 VITALS — BP 132/80 | HR 40 | Temp 98.2°F | Ht 61.0 in | Wt 148.6 lb

## 2022-07-29 DIAGNOSIS — R350 Frequency of micturition: Secondary | ICD-10-CM

## 2022-07-29 LAB — POCT URINALYSIS DIPSTICK
Bilirubin, UA: 1
Glucose, UA: NEGATIVE
Ketones, UA: 5
Nitrite, UA: NEGATIVE
Protein, UA: POSITIVE — AB
Spec Grav, UA: 1.025 (ref 1.010–1.025)
Urobilinogen, UA: 0.2 E.U./dL
pH, UA: 5.5 (ref 5.0–8.0)

## 2022-07-29 MED ORDER — CEPHALEXIN 500 MG PO CAPS: 500.0000 mg | ORAL_CAPSULE | Freq: Two times a day (BID) | ORAL | 0 refills | Status: AC

## 2022-07-29 NOTE — Progress Notes (Signed)
   Subjective:    Patient ID: Lisa Greene, female    DOB: March 06, 1941, 82 y.o.   MRN: [id number]  HPI Urinary frequency- sxs started this morning upon waking.  Pt reports urinary frequency, urgency, burning w/ urination, very little urine output.  Pt did see a few drops of blood.  No fever.  No abd pain, N/V.   Review of Systems For ROS see HPI     Objective:   Physical Exam Constitutional:      General: She is not in acute distress.    Appearance: Normal appearance. She is well-developed. She is not ill-appearing.  Abdominal:     General: There is no distension.     Palpations: Abdomen is soft.     Tenderness: There is no abdominal tenderness (no suprapubic or CVA tenderness).  Skin:    General: Skin is warm and dry.  Neurological:     General: No focal deficit present.     Mental Status: She is alert and oriented to person, place, and time.  Psychiatric:        Mood and Affect: Mood normal.        Behavior: Behavior normal.        Thought Content: Thought content normal.           Assessment & Plan:  Urinary frequency- new.  Pt reports sxs just started this morning but have been fairly intense w/ pain, frequency, hematuria.  Thankfully no fever, abd pain, N/V.  Start Cephalexin and adjust meds based on cx results if needed.  Pt expressed understanding and is in agreement w/ plan.

## 2022-07-29 NOTE — Patient Instructions (Addendum)
Follow up as needed or as scheduled START the Cephalexin twice daily w/ food Drink LOTS of water to flush the bladder Call with any questions or concerns Hang in there!!!

## 2022-07-29 NOTE — Telephone Encounter (Signed)
PA Approved

## 2022-07-31 ENCOUNTER — Ambulatory Visit: Payer: Medicare HMO | Admitting: Nurse Practitioner

## 2022-07-31 ENCOUNTER — Telehealth: Payer: Self-pay

## 2022-07-31 LAB — URINE CULTURE
MICRO NUMBER:: 14626705
SPECIMEN QUALITY:: ADEQUATE

## 2022-07-31 NOTE — Telephone Encounter (Signed)
Pt seen results Via my chart  

## 2022-07-31 NOTE — Telephone Encounter (Signed)
-----   Message from Midge Minium, MD sent at 07/31/2022  3:19 PM EST ----- Your UTI is being treated appropriately w/ antibiotics.  I hope you are feeling better!

## 2022-08-03 ENCOUNTER — Other Ambulatory Visit: Payer: Self-pay | Admitting: Family Medicine

## 2022-08-03 ENCOUNTER — Encounter: Payer: Self-pay | Admitting: Family Medicine

## 2022-08-03 MED ORDER — CIPROFLOXACIN HCL 250 MG PO TABS: 250.0000 mg | ORAL_TABLET | Freq: Two times a day (BID) | ORAL | 0 refills | Status: AC

## 2022-08-03 NOTE — Progress Notes (Signed)
Please let patient know I made a mistake in reading her antibiotic sensitivities and need to change her antibiotic to Cipro '250mg'$  twice daily x3 days, #6, no refills (I sent this to her pharmacy- please just let her know she needs to pick it up and start taking it)

## 2022-08-05 ENCOUNTER — Other Ambulatory Visit: Payer: Self-pay

## 2022-08-05 MED ORDER — METOPROLOL SUCCINATE ER 50 MG PO TB24: 50.0000 mg | ORAL_TABLET | Freq: Every day | ORAL | 0 refills | Status: DC

## 2022-08-06 ENCOUNTER — Other Ambulatory Visit: Payer: Self-pay | Admitting: *Deleted

## 2022-08-06 MED ORDER — METOPROLOL SUCCINATE ER 50 MG PO TB24: 50.0000 mg | ORAL_TABLET | Freq: Every day | ORAL | 0 refills | Status: DC

## 2022-08-06 MED ORDER — HYDROCHLOROTHIAZIDE 12.5 MG PO CAPS: 12.5000 mg | ORAL_CAPSULE | ORAL | 0 refills | Status: DC

## 2022-08-06 NOTE — Telephone Encounter (Signed)
Pt ready for scheduling for Prolia '60mg'$  on or after : 08/06/22  Out-of-pocket cost due at time of visit: $327 (Medical) / $100 (Pharmacy)  Primary: Lisa Greene Prolia co-insurance: 20% Admin fee co-insurance: 20%  Secondary: --- Prolia co-insurance:  Admin fee co-insurance:   Medical Benefit Details: Date Benefits were checked: 07/24/22 Deductible: NO/ Coinsurance: 20%/ Admin Fee: 20%  Prior Auth: APPROVED PA# [id number] Expiration Date: 07/28/23   Pharmacy benefit: Copay $100 If patient wants fill through the pharmacy benefit please send prescription to: [facility], and include estimated need by date in rx notes. Pharmacy will ship medication directly to the office.  Patient NOT eligible for Prolia Copay Card. Copay Card can make patient's cost as little as $25. Link to apply: [email or web address]  ** This summary of benefits is an estimation of the patient's out-of-pocket cost. Exact cost may very based on individual plan coverage.

## 2022-08-10 NOTE — Telephone Encounter (Signed)
Please call pt back with details about the Prolia shot

## 2022-08-10 NOTE — Progress Notes (Unsigned)
[name] [address] [place] Phone: [phone] Subjective:   [name], am serving as a scribe for Dr. Hulan Saas.  I'm seeing this patient by the request  of:  Marin Olp, MD  CC: Low back pain follow-up  [id number]  Lisa Greene is a 82 y.o. female coming in with complaint of back pain. Saw Dr. Georgina Snell for back pain but MRI was ordered by Dr. Tamala Julian. Patient then had kyphoplasty and is following up with Dr. Tamala Julian. Patient states doing much better. Has her good and bad days. Notices when grocery shopping can't walk around the store for very long.   MRI lumbar 05/17/2022 IMPRESSION: 1. Acute or subacute L2 compression fracture with mild superior endplate depression. 2. Lumbar spine degeneration especially at the L4-5 facets where there is anterolisthesis.  Past Medical History:  Diagnosis Date   ALLERGIC RHINITIS 01/31/2008   Asthma    BCC (basal cell carcinoma)sup& nod 03/01/2017   right nostril   Breast cancer of lower-inner quadrant of left female breast ([place]) 11/01/2015   treated with lumpectomy and radiation   Cataract    Colon cancer screening 05/21/2014   No polyps at age 54. Diverticulosis alone-no more colonoscopies.     DIVERTICULITIS, HX OF 09/07/2008   pt denies this    Eosinophilia 02/08/2009   Fibroid    HYPERLIPIDEMIA 12/20/2006   Hypertension    OSTEOPOROSIS 12/20/2006   PVC's (premature ventricular contractions)    SCC (squamous cell carcinoma) well diff 11/28/2013   left side chin   Vitamin B 12 deficiency    Past Surgical History:  Procedure Laterality Date   BREAST LUMPECTOMY WITH NEEDLE LOCALIZATION Left 01/03/2016   Procedure: BREAST re-excision LUMPECTOMY WITH NEEDLE LOCALIZATION;  Surgeon: Rolm Bookbinder, MD;  Location: [place];  Service: General;  Laterality: Left;  BREAST re-excision LUMPECTOMY WITH NEEDLE LOCALIZATION   BREAST LUMPECTOMY WITH RADIOACTIVE SEED  LOCALIZATION Left 11/28/2015   Procedure: LEFT BREAST LUMPECTOMY WITH BRACKETED RADIOACTIVE SEED LOCALIZATION;  Surgeon: Rolm Bookbinder, MD;  Location: [place];  Service: General;  Laterality: Left;   BREAST SURGERY     cysts removed x2   CATARACT EXTRACTION BILATERAL W/ ANTERIOR VITRECTOMY  2013   bilateral cataracts   COLONOSCOPY  2005   tics only    FOOT SURGERY     IR KYPHO LUMBAR INC FX REDUCE BONE BX UNI/BIL CANNULATION INC/IMAGING  05/28/2022   IR RADIOLOGIST EVAL & MGMT  05/21/2022   KNEE ARTHROSCOPY     left   NEUROMA SURGERY     x2 feet   thyroid duct cyst     Social History   Socioeconomic History   Marital status: Married    Spouse name: Not on file   Number of children: Not on file   Years of education: Not on file   Highest education level: Not on file  Occupational History   Occupation: retired  Tobacco Use   Smoking status: Never   Smokeless tobacco: Never  Vaping Use   Vaping Use: Never used  Substance and Sexual Activity   Alcohol use: No    Alcohol/week: 0.0 standard drinks of alcohol   Drug use: No   Sexual activity: Yes    Partners: Male    Birth control/protection: Post-menopausal  Other Topics Concern   Not on file  Social History Narrative   Married. No kids- 2 step kids. 5 step grandkids.  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[employer])      Hobbies: beach, read, crochet, knit, watch tv   Social Determinants of Health   Financial Resource Strain: Low Risk  (11/18/2021)   Overall Financial Resource Strain (CARDIA)    Difficulty of Paying Living Expenses: Not hard at all  Food Insecurity: No Food Insecurity (11/18/2021)   Hunger Vital Sign    Worried About Running Out of Food in the Last Year: Never true    [place] in the Last Year: Never true  Transportation Needs: No Transportation Needs (11/18/2021)   PRAPARE - Hydrologist (Medical): No     Lack of Transportation (Non-Medical): No  Physical Activity: Sufficiently Active (11/18/2021)   Exercise Vital Sign    Days of Exercise per Week: 6 days    Minutes of Exercise per Session: 30 min  Stress: No Stress Concern Present (11/18/2021)   [place]    Feeling of Stress : Not at all  Social Connections: Moderately Integrated (11/18/2021)   Social Connection and Isolation Panel [NHANES]    Frequency of Communication with Friends and Family: More than three times a week    Frequency of Social Gatherings with Friends and Family: More than three times a week    Attends Religious Services: More than 4 times per year    Active Member of [employer] or Organizations: No    Attends Music therapist: Never    Marital Status: Married   Allergies  Allergen Reactions   Lisinopril Other (See Comments) and Cough    Dry cough and stinging rash   Latex Rash   Penicillins Rash    Has patient had a PCN reaction causing immediate rash, facial/tongue/throat swelling, SOB or lightheadedness with hypotension: Yes Has patient had a PCN reaction causing severe rash involving mucus membranes or skin necrosis: No Has patient had a PCN reaction that required hospitalization No Has patient had a PCN reaction occurring within the last 10 years: No If all of the above answers are "NO", then may proceed with Cephalosporin use.    Sulfonamide Derivatives Rash   Tape Rash    Band-aids break out skin!!   Family History  Problem Relation Age of Onset   Brain cancer Mother    COPD Father    Heart disease Father        stent placed 66   Allergic rhinitis Father    COPD Brother        died of flu/double pna age 64   Healthy Brother        lives local- sees Dr. Yong Channel   Arthritis Brother        back issues   Colon cancer Neg Hx    Stomach cancer Neg Hx      Current Outpatient Medications (Cardiovascular):    atorvastatin  (LIPITOR) 20 MG tablet, Take 1 tablet (20 mg total) by mouth daily.   hydrochlorothiazide (MICROZIDE) 12.5 MG capsule, Take 1 capsule (12.5 mg total) by mouth every Monday, Wednesday, and Friday.   irbesartan (AVAPRO) 150 MG tablet, TAKE ONE TABLET BY MOUTH DAILY   metoprolol succinate (TOPROL-XL) 50 MG 24 hr tablet, Take 1 tablet (50 mg total) by mouth daily.  Current Outpatient Medications (Respiratory):    Albuterol Sulfate (PROAIR RESPICLICK) 123XX123 (90 Base) MCG/ACT AEPB, Inhale 1 puff into the lungs every 8 (eight)  hours as needed (wheezing/shortness of breath).   BREO ELLIPTA 200-25 MCG/INH AEPB, Inhale 1 puff into the lungs daily.   cetirizine (ZYRTEC) 10 MG tablet, Take 10 mg by mouth daily.   fluticasone (FLONASE) 50 MCG/ACT nasal spray, Place 1 spray into both nostrils daily as needed.    montelukast (SINGULAIR) 10 MG tablet, Take 1 tablet by mouth at bedtime.   Current Outpatient Medications (Hematological):    Cyanocobalamin (VITAMIN B 12 PO), Take 1,000 mg by mouth every other day.  Current Outpatient Medications (Other):    azelastine (OPTIVAR) 0.05 % ophthalmic solution,    calcium citrate (CALCITRATE - DOSED IN MG ELEMENTAL CALCIUM) 950 (200 Ca) MG tablet, Take 200 mg of elemental calcium by mouth daily.   cholecalciferol (VITAMIN D) 1000 UNITS tablet, Take 2,000 Units by mouth daily.   tiZANidine (ZANAFLEX) 2 MG tablet, Take 1 tablet (2 mg total) by mouth every 8 (eight) hours as needed for muscle spasms.   Reviewed prior external information including notes and imaging from  primary care provider As well as notes that were available from care everywhere and other healthcare systems.  Past medical history, social, surgical and family history all reviewed in electronic medical record.  No pertanent information unless stated regarding to the chief complaint.   Review of Systems:  No headache, visual changes, nausea, vomiting, diarrhea, constipation, dizziness, abdominal  pain, skin rash, fevers, chills, night sweats, weight loss, swollen lymph nodes, body aches, joint swelling, chest pain, shortness of breath, mood changes. POSITIVE muscle aches  Objective  Blood pressure 122/74, pulse (!) 42, height '5\' 1"'$  (1.549 m), weight 148 lb (67.1 kg), SpO2 99 %.   General: No apparent distress alert and oriented x3 mood and affect normal, dressed appropriately.  HEENT: Pupils equal, extraocular movements intact  Respiratory: Patient's speak in full sentences and does not appear short of breath  Cardiovascular: No lower extremity edema, non tender, no erythema  Low back does have some loss of lordosis.  Some worsening pain with extension of the back noted negative straight leg test noted. Neurovascular intact distally otherwise.    Impression and Recommendations:    The above documentation has been reviewed and is accurate and complete Lyndal Pulley, DO

## 2022-08-11 ENCOUNTER — Encounter: Payer: Self-pay | Admitting: Family Medicine

## 2022-08-11 ENCOUNTER — Ambulatory Visit: Payer: Medicare HMO | Admitting: Family Medicine

## 2022-08-11 VITALS — BP 122/74 | HR 42 | Ht 61.0 in | Wt 148.0 lb

## 2022-08-11 DIAGNOSIS — M545 Low back pain, unspecified: Secondary | ICD-10-CM | POA: Diagnosis not present

## 2022-08-11 DIAGNOSIS — S32020D Wedge compression fracture of second lumbar vertebra, subsequent encounter for fracture with routine healing: Secondary | ICD-10-CM | POA: Diagnosis not present

## 2022-08-11 DIAGNOSIS — M47816 Spondylosis without myelopathy or radiculopathy, lumbar region: Secondary | ICD-10-CM

## 2022-08-11 DIAGNOSIS — M8080XS Other osteoporosis with current pathological fracture, unspecified site, sequela: Secondary | ICD-10-CM | POA: Diagnosis not present

## 2022-08-11 NOTE — Assessment & Plan Note (Signed)
Has responded extremely well to the kyphoplasty.  Discussed with patient that with his fracture and the bone density increase activity slowly.  Follow-up with me again in 6 to 8 weeks.

## 2022-08-11 NOTE — Patient Instructions (Addendum)
[place] [place] [phone] Call Today  See you again in 2 months after injection

## 2022-08-11 NOTE — Assessment & Plan Note (Signed)
Patient does have the osteoporosis.  The patient is awaiting for approval for Prolia.  I do think that this will be beneficial.  Will likely need it lifelong

## 2022-08-11 NOTE — Assessment & Plan Note (Signed)
Patient does have facet arthropathy noted.  Discussed with patient about this and where patient's pain is now.  Believe that the kyphoplasty did work for the compression fracture that was noted previously.  Discussed with patient about icing regimen and home exercises otherwise.  Patient would like to try the facet injections to see if this will help with some of the chronic discomfort that she is having.  We discussed icing regimen and home exercises, which activities to do and which ones to avoid.  Follow-up with me again in 6 to 8 weeks total time reviewing the chart as well as discussing with patient patient's imaging 32 minutes

## 2022-08-12 ENCOUNTER — Telehealth: Payer: Self-pay | Admitting: Family Medicine

## 2022-08-12 NOTE — Telephone Encounter (Signed)
Called to schedule pt and she stated she would like the pharmacy benefit for $100. Could this be ordered and sent to office so scheduling can be completed?   Also pt is getting an injection in spine for arthritis, would like to know if this could be done around the same time or not. She is not sure what will be injected but will also as orthopedics.

## 2022-08-12 NOTE — Telephone Encounter (Signed)
Dr. Tamala Julian ordered an epidural, pt PCP ordered Prolia. Patient is attempting to schedule both and wonders if there should be an amount of time between the two?

## 2022-08-12 NOTE — Telephone Encounter (Signed)
Sent patient MyChart message with recommendations.  

## 2022-08-12 NOTE — Telephone Encounter (Signed)
Pt informed

## 2022-08-14 ENCOUNTER — Ambulatory Visit (INDEPENDENT_AMBULATORY_CARE_PROVIDER_SITE_OTHER): Payer: Medicare HMO | Admitting: Family Medicine

## 2022-08-14 ENCOUNTER — Encounter: Payer: Self-pay | Admitting: Family Medicine

## 2022-08-14 VITALS — BP 132/68 | HR 63 | Temp 98.0°F | Ht 61.0 in | Wt 148.0 lb

## 2022-08-14 DIAGNOSIS — S32020D Wedge compression fracture of second lumbar vertebra, subsequent encounter for fracture with routine healing: Secondary | ICD-10-CM

## 2022-08-14 DIAGNOSIS — R350 Frequency of micturition: Secondary | ICD-10-CM | POA: Diagnosis not present

## 2022-08-14 DIAGNOSIS — I10 Essential (primary) hypertension: Secondary | ICD-10-CM | POA: Diagnosis not present

## 2022-08-14 LAB — POCT URINALYSIS DIPSTICK
Bilirubin, UA: NEGATIVE
Blood, UA: NEGATIVE
Glucose, UA: NEGATIVE
Ketones, UA: NEGATIVE
Leukocytes, UA: NEGATIVE
Nitrite, UA: NEGATIVE
Protein, UA: NEGATIVE
Spec Grav, UA: 1.015 (ref 1.010–1.025)
Urobilinogen, UA: 0.2 E.U./dL — AB
pH, UA: 5.5 (ref 5.0–8.0)

## 2022-08-14 MED ORDER — FOSFOMYCIN TROMETHAMINE 3 G PO PACK: 3.0000 g | PACK | Freq: Once | ORAL | 0 refills | Status: AC

## 2022-08-14 NOTE — Progress Notes (Signed)
   Lisa Greene is a 82 y.o. female who presents today for an office visit.  Assessment/Plan:  New/Acute Problems: UTI No signs of systemic illness though still has persistent symptoms.  Culture was positive for Pseudomonas a couple weeks ago and she completed a course of ciprofloxacin 250 mg twice daily for 3 days.  UA is negative today.  Will check urine culture.  Will give fosfomycin 3g one time dose.  If symptoms do not improve with this would consider trial of higher dose and extended course of Levaquin or ciprofloxacin.  Encouraged hydration.  Discussed reasons to return to care.  Follow-up as needed.  Chronic Problems Addressed Today: Essential Hypertension Initially elevated to 161/67 however improved to 132/68 on recheck.Continue HCTZ 12.5 mg 3 times weekly, metoprolol succinate 50 mg daily,  and irbesartan 150 mg daily.  Lumbar facet arthropathy Has upcoming steroid injections next week.  Discussed with patient and it will be fine for her to proceed with this.      Subjective:  HPI:  Patient here with urinary frequency.  This started a few weeks ago.  She was seen by a provider at a different office.  Diagnosed with UTI. Started on Keflex.  Urine culture was obtained.  Culture was positive for Pseudomonas.  Her antibiotics were changed to ciprofloxacin 250 mg bid for 3 days.  Symptoms still have not improved. Still some lower abdominal pain. No fevers. Some chills.        Objective:  Physical Exam: BP 132/68   Pulse 63   Temp 98 F (36.7 C) (Temporal)   Ht 5\' 1"  (1.549 m)   Wt 148 lb (67.1 kg)   SpO2 99%   BMI 27.96 kg/m   Gen: No acute distress, resting comfortably Neuro: Grossly normal, moves all extremities Psych: Normal affect and thought content      Varnell Donate M. Jerline Pain, MD 08/14/2022 11:35 AM

## 2022-08-14 NOTE — Patient Instructions (Addendum)
It was very nice to see you today!  Please take the fosfomycin.  We will call you on Monday with results of your urine culture.  We have prescribed you Tessalon in the past for your cough.  Take care, Dr Jerline Pain  PLEASE NOTE:  If you had any lab tests, please let us know if you have not heard back within a few days. You may see your results on mychart before we have a chance to review them but we will give you a call once they are reviewed by [place].   If we ordered any referrals today, please let us know if you have not heard from their office within the next week.   If you had any urgent prescriptions sent in today, please check with the pharmacy within an hour of our visit to make sure the prescription was transmitted appropriately.   Please try these tips to maintain a healthy lifestyle:  Eat at least 3 REAL meals and 1-2 snacks per day.  Aim for no more than 5 hours between eating.  If you eat breakfast, please do so within one hour of getting up.   Each meal should contain half fruits/vegetables, one quarter protein, and one quarter carbs (no bigger than a computer mouse)  Cut down on sweet beverages. This includes juice, soda, and sweet tea.   Drink at least 1 glass of water with each meal and aim for at least 8 glasses per day  Exercise at least 150 minutes every week.

## 2022-08-15 LAB — URINE CULTURE
MICRO NUMBER:: 14698826
SPECIMEN QUALITY:: ADEQUATE

## 2022-08-17 ENCOUNTER — Inpatient Hospital Stay: Admission: RE | Admit: 2022-08-17 | Payer: Medicare HMO | Source: Ambulatory Visit

## 2022-08-17 ENCOUNTER — Other Ambulatory Visit: Payer: Medicare HMO

## 2022-08-17 ENCOUNTER — Ambulatory Visit: Payer: Medicare HMO | Admitting: Family Medicine

## 2022-08-17 NOTE — Telephone Encounter (Signed)
I have never ordered this, can you help me please?

## 2022-08-17 NOTE — Progress Notes (Signed)
Please inform patient of the following:  Culture is inconclusive.  She should let us know if her symptoms or not improving after the fosfomycin.

## 2022-08-18 ENCOUNTER — Other Ambulatory Visit: Payer: Self-pay | Admitting: Family Medicine

## 2022-08-18 DIAGNOSIS — E785 Hyperlipidemia, unspecified: Secondary | ICD-10-CM

## 2022-08-18 MED ORDER — DENOSUMAB 60 MG/ML ~~LOC~~ SOSY: 60.0000 mg | PREFILLED_SYRINGE | Freq: Once | SUBCUTANEOUS | 0 refills | Status: AC

## 2022-08-18 NOTE — Telephone Encounter (Signed)
Rx sent to pharmacy   

## 2022-08-19 ENCOUNTER — Telehealth: Payer: Self-pay | Admitting: Family Medicine

## 2022-08-19 NOTE — Telephone Encounter (Signed)
Cathy from [facility] called stating that Holland Falling is needing more information than provided for the the facet injection. All clinical notes in Epic were sent but more information is needed.  She did not have a full denial but did have Case # [id number].

## 2022-08-19 NOTE — Telephone Encounter (Signed)
Peer to peer set up for tomorrow at 1215. I will bring the sheet to you with the info on it

## 2022-08-21 NOTE — Telephone Encounter (Signed)
Patient would like to know why [facility] called her to have prolia shot done by them- patient wants to know where she should have this prolia shot completed.   She would like to know price for [facility] coming from [facility] and Prolia coming from [facility]

## 2022-08-24 ENCOUNTER — Telehealth: Payer: Self-pay | Admitting: Family Medicine

## 2022-08-24 NOTE — Telephone Encounter (Signed)
Patient would like to know when will she be do for Prolia

## 2022-08-25 ENCOUNTER — Encounter: Payer: Self-pay | Admitting: Internal Medicine

## 2022-08-25 ENCOUNTER — Ambulatory Visit: Payer: Medicare HMO | Attending: Internal Medicine | Admitting: Internal Medicine

## 2022-08-25 VITALS — BP 162/70 | HR 82 | Ht 61.0 in | Wt 149.2 lb

## 2022-08-25 DIAGNOSIS — I700 Atherosclerosis of aorta: Secondary | ICD-10-CM

## 2022-08-25 DIAGNOSIS — I493 Ventricular premature depolarization: Secondary | ICD-10-CM | POA: Diagnosis not present

## 2022-08-25 DIAGNOSIS — E782 Mixed hyperlipidemia: Secondary | ICD-10-CM | POA: Diagnosis not present

## 2022-08-25 DIAGNOSIS — E785 Hyperlipidemia, unspecified: Secondary | ICD-10-CM | POA: Diagnosis not present

## 2022-08-25 MED ORDER — HYDROCHLOROTHIAZIDE 12.5 MG PO CAPS: 12.5000 mg | ORAL_CAPSULE | ORAL | 2 refills | Status: DC

## 2022-08-25 MED ORDER — ATORVASTATIN CALCIUM 20 MG PO TABS: 20.0000 mg | ORAL_TABLET | Freq: Every day | ORAL | 3 refills | Status: DC

## 2022-08-25 MED ORDER — METOPROLOL SUCCINATE ER 50 MG PO TB24: 50.0000 mg | ORAL_TABLET | Freq: Every day | ORAL | 3 refills | Status: DC

## 2022-08-25 MED ORDER — IRBESARTAN 150 MG PO TABS: 150.0000 mg | ORAL_TABLET | Freq: Every day | ORAL | 3 refills | Status: DC

## 2022-08-25 NOTE — Patient Instructions (Signed)
Medication Instructions:  Your physician recommends that you continue on your current medications as directed. Please refer to the Current Medication list given to you today. REFILLED: atorvastatin (Lipitor), HCTZ, irbesartan, and metoprolol succinate   *If you need a refill on your cardiac medications before your next appointment, please call your pharmacy*   Lab Work: NONE If you have labs (blood work) drawn today and your tests are completely normal, you will receive your results only by: [place] (if you have MyChart) OR A paper copy in the mail If you have any lab test that is abnormal or we need to change your treatment, we will call you to review the results.   Testing/Procedures: NONE   Follow-Up: At [facility], you and your health needs are our priority.  As part of our continuing mission to provide you with exceptional heart care, we have created designated Provider Care Teams.  These Care Teams include your primary Cardiologist (physician) and Advanced Practice Providers (APPs -  Physician Assistants and Nurse Practitioners) who all work together to provide you with the care you need, when you need it.     Your next appointment:   1 year(s)  Provider:   Rudean Haskell, MD

## 2022-08-25 NOTE — Progress Notes (Signed)
Cardiology Office Note:    Date:  08/25/2022   ID:  Lisa Greene, DOB [birth year], MRN [id number]  PCP:  Marin Olp, [place] Providers Cardiologist:  Sinclair Grooms, MD (Inactive)     Referring MD: Marin Olp, MD   CC: Transition to new cardiologist  History of Present Illness:    Lisa Greene is a 82 y.o. female with a hx of HLD with Aortic atherosclerosis and frequent asymptomatic PVCs.  Complicated by hypotension.  Patient notes that she is doing great.   Since last visit notes that she is quite busy. She bakes frequently and just made some pound cake.  Husband notes that she does the weed eating.  No chest pain or pressure .  No SOB/DOE and no PND/Orthopnea.  No weight gain or leg swelling.  No palpitations or syncope.  Ambulatory blood pressure 122/70.  Her cuff has been validated at this office in the past.  BP is ironically only elevated at a select few offices, including this one.   Past Medical History:  Diagnosis Date   ALLERGIC RHINITIS 01/31/2008   Asthma    BCC (basal cell carcinoma)sup& nod 03/01/2017   right nostril   Breast cancer of lower-inner quadrant of left female breast ([place]) 11/01/2015   treated with lumpectomy and radiation   Cataract    Colon cancer screening 05/21/2014   No polyps at age 38. Diverticulosis alone-no more colonoscopies.     DIVERTICULITIS, HX OF 09/07/2008   pt denies this    Eosinophilia 02/08/2009   Fibroid    HYPERLIPIDEMIA 12/20/2006   Hypertension    OSTEOPOROSIS 12/20/2006   PVC's (premature ventricular contractions)    SCC (squamous cell carcinoma) well diff 11/28/2013   left side chin   Vitamin B 12 deficiency     Past Surgical History:  Procedure Laterality Date   BREAST LUMPECTOMY WITH NEEDLE LOCALIZATION Left 01/03/2016   Procedure: BREAST re-excision LUMPECTOMY WITH NEEDLE LOCALIZATION;  Surgeon: Rolm Bookbinder, MD;  Location: [facility];  Service: General;   Laterality: Left;  BREAST re-excision LUMPECTOMY WITH NEEDLE LOCALIZATION   BREAST LUMPECTOMY WITH RADIOACTIVE SEED LOCALIZATION Left 11/28/2015   Procedure: LEFT BREAST LUMPECTOMY WITH BRACKETED RADIOACTIVE SEED LOCALIZATION;  Surgeon: Rolm Bookbinder, MD;  Location: [facility];  Service: General;  Laterality: Left;   BREAST SURGERY     cysts removed x2   CATARACT EXTRACTION BILATERAL W/ ANTERIOR VITRECTOMY  2013   bilateral cataracts   COLONOSCOPY  2005   tics only    FOOT SURGERY     IR KYPHO LUMBAR INC FX REDUCE BONE BX UNI/BIL CANNULATION INC/IMAGING  05/28/2022   IR RADIOLOGIST EVAL & MGMT  05/21/2022   KNEE ARTHROSCOPY     left   NEUROMA SURGERY     x2 feet   thyroid duct cyst      Current Medications: Current Meds  Medication Sig   Albuterol Sulfate (PROAIR RESPICLICK) 123XX123 (90 Base) MCG/ACT AEPB Inhale 1 puff into the lungs every 8 (eight) hours as needed (wheezing/shortness of breath).   azelastine (OPTIVAR) 0.05 % ophthalmic solution    BREO ELLIPTA 200-25 MCG/INH AEPB Inhale 1 puff into the lungs daily.   calcium citrate (CALCITRATE - DOSED IN MG ELEMENTAL CALCIUM) 950 (200 Ca) MG tablet Take 200 mg of elemental calcium by mouth daily.   cetirizine (ZYRTEC) 10 MG tablet Take 10 mg by mouth daily.   cholecalciferol (VITAMIN D) 1000  UNITS tablet Take 2,000 Units by mouth daily.   Cyanocobalamin (VITAMIN B 12 PO) Take 1,000 mg by mouth every other day.   fluticasone (FLONASE) 50 MCG/ACT nasal spray Place 1 spray into both nostrils daily as needed.    montelukast (SINGULAIR) 10 MG tablet Take 1 tablet by mouth at bedtime.   PROLIA 60 MG/ML SOSY injection Inject into the skin every 6 (six) months.   [DISCONTINUED] atorvastatin (LIPITOR) 20 MG tablet TAKE ONE TABLET BY MOUTH DAILY   [DISCONTINUED] hydrochlorothiazide (MICROZIDE) 12.5 MG capsule Take 1 capsule (12.5 mg total) by mouth every Monday, Wednesday, and Friday.   [DISCONTINUED] irbesartan (AVAPRO) 150  MG tablet TAKE ONE TABLET BY MOUTH DAILY   [DISCONTINUED] metoprolol succinate (TOPROL-XL) 50 MG 24 hr tablet Take 1 tablet (50 mg total) by mouth daily.     Allergies:   Lisinopril, Latex, Penicillins, Sulfonamide derivatives, and Tape   Social History   Socioeconomic History   Marital status: Married    Spouse name: Not on file   Number of children: Not on file   Years of education: Not on file   Highest education level: Not on file  Occupational History   Occupation: retired  Tobacco Use   Smoking status: Never   Smokeless tobacco: Never  Vaping Use   Vaping Use: Never used  Substance and Sexual Activity   Alcohol use: No    Alcohol/week: 0.0 standard drinks of alcohol   Drug use: No   Sexual activity: Yes    Partners: Male    Birth control/protection: Post-menopausal  Other Topics Concern   Not on file  Social History Narrative   Married. No kids- 2 step kids. 5 step grandkids.  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[employer])      Hobbies: beach, read, crochet, knit, watch tv   Social Determinants of Health   Financial Resource Strain: [place]  (11/18/2021)   Overall Financial Resource Strain (CARDIA)    Difficulty of Paying Living Expenses: Not hard at all  Food Insecurity: No Food Insecurity (11/18/2021)   Hunger Vital Sign    Worried About Running Out of Food in the Last Year: Never true    [place] in the Last Year: Never true  Transportation Needs: No Transportation Needs (11/18/2021)   PRAPARE - Hydrologist (Medical): No    Lack of Transportation (Non-Medical): No  Physical Activity: Sufficiently Active (11/18/2021)   Exercise Vital Sign    Days of Exercise per Week: 6 days    Minutes of Exercise per Session: 30 min  Stress: No Stress Concern Present (11/18/2021)   [place]    Feeling of Stress : Not at all  Social  Connections: Moderately Integrated (11/18/2021)   Social Connection and Isolation Panel [NHANES]    Frequency of Communication with Friends and Family: More than three times a week    Frequency of Social Gatherings with Friends and Family: More than three times a week    Attends Religious Services: More than 4 times per year    Active Member of [employer] or Organizations: No    Attends Music therapist: Never    Marital Status: Married     Family History: The patient's family history includes Allergic rhinitis in her father; Arthritis in her brother; Brain cancer in her mother; COPD in her brother and father;  Healthy in her brother; Heart disease in her father. There is no history of Colon cancer or Stomach cancer.  ROS:   Please see the history of present illness.     All other systems reviewed and are negative.  EKGs/Labs/Other Studies Reviewed:    The following studies were reviewed today:  EKG:  EKG is  ordered today.  The ekg ordered today demonstrates  08/25/22: Sinus with frequent PVCs in couplets and triplets  Cardiac Studies & Procedures     STRESS TESTS  MYOCARDIAL PERFUSION IMAGING 08/05/2020  Narrative  The left ventricular ejection fraction is moderately decreased (30-44%).  Nuclear stress EF: 38%.  Blood pressure demonstrated a normal response to exercise.  Upsloping ST segment depression ST segment depression of 1 mm was noted during stress in the II, III, aVF, V5, V6 and V4 leads.  This is an intermediate risk study.  Normal perfusion. LVEF 38% with global hypokinesis. Frequent PVC's, however, were noted with exercise and may have adversely affected gating leading to possibly a falsely low LVEF- echo correlation is recommended. Moderately impaired exercise tolerance. Overall an intermediate risk study, but no ischemia. No prior for comparison.   ECHOCARDIOGRAM  ECHOCARDIOGRAM COMPLETE 07/01/2020  Narrative ECHOCARDIOGRAM REPORT    Patient Name:    KENYOTTA FRISKE Date of Exam: 07/01/2020 Medical Rec #:  [id number]       Height:       60.0 in Accession #:    [id number]      Weight:       151.2 lb Date of Birth:  [birth year]       BSA:          1.657 m Patient Age:    37 years        BP:           154/82 mmHg Patient Gender: F               HR:           62 bpm. Exam Location:  [address]  Procedure: 2D Echo, Cardiac Doppler and Color Doppler  Indications:    I49.3 PVC.  History:        Patient has prior history of Echocardiogram examinations, most recent 11/26/2016. Arrythmias:PVC, Signs/Symptoms:Murmur; Risk Factors:Hypertension and Dyslipidemia. CKD stage 3. H/o breast cancer.  Sonographer:    Jessee Avers, RDCS Referring Phys: [place]   1. Infero basal hypokinesis . Left ventricular ejection fraction, by estimation, is 50 to 55%. The left ventricle has low normal function. The left ventricle demonstrates regional wall motion abnormalities (see scoring diagram/findings for description). Left ventricular diastolic parameters are consistent with Grade II diastolic dysfunction (pseudonormalization). Elevated left ventricular end-diastolic pressure. 2. Right ventricular systolic function is normal. The right ventricular size is normal. There is normal pulmonary artery systolic pressure. 3. Left atrial size was mildly dilated. 4. The mitral valve is degenerative. Mild mitral valve regurgitation. No evidence of mitral stenosis. 5. The aortic valve is tricuspid. Aortic valve regurgitation is not visualized. No aortic stenosis is present. 6. The inferior vena cava is normal in size with greater than 50% respiratory variability, suggesting right atrial pressure of 3 mmHg.  Comparison(s): 11/26/16 EF 60-65%. PA pressure 35mmHg.  FINDINGS Left Ventricle: Infero basal hypokinesis. Left ventricular ejection fraction, by estimation, is 50 to 55%. The left ventricle has low normal function. The left ventricle  demonstrates regional wall motion abnormalities. The left ventricular internal cavity size was normal in  size. There is no left ventricular hypertrophy. Left ventricular diastolic parameters are consistent with Grade II diastolic dysfunction (pseudonormalization). Elevated left ventricular end-diastolic pressure.  Right Ventricle: The right ventricular size is normal. No increase in right ventricular wall thickness. Right ventricular systolic function is normal. There is normal pulmonary artery systolic pressure. The tricuspid regurgitant velocity is 2.64 m/s, and with an assumed right atrial pressure of 8 mmHg, the estimated right ventricular systolic pressure is [id number].  Left Atrium: Left atrial size was mildly dilated.  Right Atrium: Right atrial size was normal in size.  Pericardium: There is no evidence of pericardial effusion.  Mitral Valve: The mitral valve is degenerative in appearance. There is mild thickening of the mitral valve leaflet(s). There is mild calcification of the mitral valve leaflet(s). Mild mitral annular calcification. Mild mitral valve regurgitation. No evidence of mitral valve stenosis.  Tricuspid Valve: The tricuspid valve is normal in structure. Tricuspid valve regurgitation is mild . No evidence of tricuspid stenosis.  Aortic Valve: The aortic valve is tricuspid. Aortic valve regurgitation is not visualized. No aortic stenosis is present.  Pulmonic Valve: The pulmonic valve was normal in structure. Pulmonic valve regurgitation is not visualized. No evidence of pulmonic stenosis.  Aorta: The aortic root is normal in size and structure.  Venous: The inferior vena cava is normal in size with greater than 50% respiratory variability, suggesting right atrial pressure of 3 mmHg.  IAS/Shunts: No atrial level shunt detected by color flow Doppler.   LEFT VENTRICLE PLAX 2D LVIDd:         4.70 cm  Diastology LVIDs:         3.20 cm  LV e' medial:    3.31 cm/s LV  PW:         0.80 cm  LV E/e' medial:  14.2 LV IVS:        0.80 cm  LV e' lateral:   3.12 cm/s LVOT diam:     2.00 cm  LV E/e' lateral: 15.1 LV SV:         47 LV SV Index:   28 LVOT Area:     3.14 cm   RIGHT VENTRICLE RV Basal diam:  2.50 cm RV S prime:     10.80 cm/s TAPSE (M-mode): 2.1 cm RVSP:           35.9 mmHg  LEFT ATRIUM             Index       RIGHT ATRIUM           Index LA diam:        4.00 cm 2.41 cm/m  RA Pressure: 8.00 mmHg LA Vol (A2C):   42.9 ml 25.88 ml/m RA Area:     9.01 cm LA Vol (A4C):   48.8 ml 29.44 ml/m RA Volume:   14.90 ml  8.99 ml/m LA Biplane Vol: 45.9 ml 27.69 ml/m AORTIC VALVE LVOT Vmax:   58.75 cm/s LVOT Vmean:  37.400 cm/s LVOT VTI:    0.149 m  AORTA Ao Root diam: 3.00 cm Ao Asc diam:  2.80 cm  MITRAL VALVE               TRICUSPID VALVE TR Peak grad:   27.9 mmHg TR Vmax:        264.00 cm/s MV E velocity: 47.00 cm/s  Estimated RAP:  8.00 mmHg MV A velocity: 93.00 cm/s  RVSP:           35.9  mmHg MV E/A ratio:  0.51 SHUNTS Systemic VTI:  0.15 m Systemic Diam: 2.00 cm  Jenkins Rouge MD Electronically signed by Jenkins Rouge MD Signature Date/Time: 07/01/2020/5:26:55 PM    Final    MONITORS  CARDIAC EVENT MONITOR 06/01/2020  Narrative  Normal sinus rhythm and sinus bradycardia. HR range 48-114 bpm, ave 74 bpm  Frequent PVC's occasionally bigeminy and trigeminy.  PVC burden 15%  No atrial fibrillation.  C/O flutter and skipped beat is related to PVC's  Compared to prior monitor 11/2016, PVC burden has decreased. No atrial fibrillation or ventricular tachycardia.  We should consider EP consult if still symptomatic.            Recent Labs: 07/14/2022: ALT 15; BUN 17; Creatinine, Ser 1.26; Hemoglobin 13.0; Platelets 309.0; Potassium 4.4; Sodium 133  Recent Lipid Panel    Component Value Date/Time   CHOL 148 07/14/2022 0945   TRIG 71.0 07/14/2022 0945   HDL 72.50 07/14/2022 0945   CHOLHDL 2 07/14/2022 0945   VLDL 14.2  07/14/2022 0945   LDLCALC 61 07/14/2022 0945   LDLCALC 58 04/02/2020 1138   LDLDIRECT 59.0 10/17/2019 0930        Physical Exam:    VS:  BP (!) 162/70   Pulse 82   Ht 5\' 1"  (1.549 m)   Wt 149 lb 3.2 oz (67.7 kg)   SpO2 95%   BMI 28.19 kg/m     Wt Readings from Last 3 Encounters:  08/25/22 149 lb 3.2 oz (67.7 kg)  08/14/22 148 lb (67.1 kg)  08/11/22 148 lb (67.1 kg)    GEN:  Well nourished, well developed in no acute distress HEENT: Normal NECK: No JVD CARDIAC: IRIR, no murmurs, rubs, gallops RESPIRATORY:  Clear to auscultation without rales, wheezing or rhonchi  ABDOMEN: Soft, non-tender, non-distended MUSCULOSKELETAL:  trace bilateral edema; No deformity  SKIN: Warm and dry NEUROLOGIC:  Alert and oriented x 3 PSYCHIATRIC:  Normal affect   ASSESSMENT:    1. PVC (premature ventricular contraction)   2. Hyperlipidemia, unspecified hyperlipidemia type   3. Aortic atherosclerosis ([place])   4. Mixed hyperlipidemia    PLAN:    Frequent PVCs with basal WMA - asymptomatic with normal function, no evidence of ischemia, and with EP evaluation - low threshold for CMR; repeat ischemic eval, and repeat EP f/u   HLD with Aortic atherosclerosis - LDL well controlled on atorvastatin 20 mg  Mild intermittent asthma  [place] HTN - AMB BP at home is 120/70 she has had her cuff checked here at office - continue current regimen  One year follow up with me       Medication Adjustments/Labs and Tests Ordered: Current medicines are reviewed at length with the patient today.  Concerns regarding medicines are outlined above.  Orders Placed This Encounter  Procedures   EKG 12-Lead   Meds ordered this encounter  Medications   atorvastatin (LIPITOR) 20 MG tablet    Sig: Take 1 tablet (20 mg total) by mouth daily.    Dispense:  90 tablet    Refill:  3   hydrochlorothiazide (MICROZIDE) 12.5 MG capsule    Sig: Take 1 capsule (12.5 mg total) by mouth every Monday, Wednesday,  and Friday.    Dispense:  45 capsule    Refill:  2   irbesartan (AVAPRO) 150 MG tablet    Sig: Take 1 tablet (150 mg total) by mouth daily.    Dispense:  90 tablet    Refill:  3   metoprolol succinate (TOPROL-XL) 50 MG 24 hr tablet    Sig: Take 1 tablet (50 mg total) by mouth daily.    Dispense:  90 tablet    Refill:  3    Please call our office to schedule an overdue appointment with Cardiologist before anymore refills. [phone]. Thank you 1st attempt    Patient Instructions  Medication Instructions:  Your physician recommends that you continue on your current medications as directed. Please refer to the Current Medication list given to you today. REFILLED: atorvastatin (Lipitor), HCTZ, irbesartan, and metoprolol succinate   *If you need a refill on your cardiac medications before your next appointment, please call your pharmacy*   Lab Work: NONE If you have labs (blood work) drawn today and your tests are completely normal, you will receive your results only by: [place] (if you have MyChart) OR A paper copy in the mail If you have any lab test that is abnormal or we need to change your treatment, we will call you to review the results.   Testing/Procedures: NONE   Follow-Up: At [facility], you and your health needs are our priority.  As part of our continuing mission to provide you with exceptional heart care, we have created designated Provider Care Teams.  These Care Teams include your primary Cardiologist (physician) and Advanced Practice Providers (APPs -  Physician Assistants and Nurse Practitioners) who all work together to provide you with the care you need, when you need it.     Your next appointment:   1 year(s)  Provider:   Rudean Haskell, MD       Signed, Werner Lean, MD  08/25/2022 4:09 PM    [place]

## 2022-08-26 ENCOUNTER — Ambulatory Visit
Admission: RE | Admit: 2022-08-26 | Discharge: 2022-08-26 | Disposition: A | Discharge status: Home / Self Care | Payer: Medicare HMO | Source: Ambulatory Visit | Attending: Family Medicine | Admitting: Family Medicine

## 2022-08-26 DIAGNOSIS — M545 Low back pain, unspecified: Secondary | ICD-10-CM

## 2022-08-26 MED ORDER — METHYLPREDNISOLONE ACETATE 40 MG/ML INJ SUSP (RADIOLOG
80.0000 mg | Freq: Once | INTRAMUSCULAR | Status: AC
  Administered 2022-08-26: 80 mg via INTRA_ARTICULAR

## 2022-08-26 MED ORDER — IOPAMIDOL (ISOVUE-M 200) INJECTION 41%
1.0000 mL | Freq: Once | INTRAMUSCULAR | Status: AC
  Administered 2022-08-26: 1 mL via INTRA_ARTICULAR

## 2022-08-26 NOTE — Discharge Instructions (Signed)

## 2022-09-07 NOTE — Telephone Encounter (Signed)
Please see encounter on 07/24/22. Per encounter, patient will get Prolia through pharmacy benefit and it was already sent to pharmacy. Office will have to schedule patient to come in.

## 2022-09-09 ENCOUNTER — Ambulatory Visit (INDEPENDENT_AMBULATORY_CARE_PROVIDER_SITE_OTHER): Payer: Medicare HMO | Admitting: *Deleted

## 2022-09-09 DIAGNOSIS — M8080XS Other osteoporosis with current pathological fracture, unspecified site, sequela: Secondary | ICD-10-CM

## 2022-09-09 MED ORDER — DENOSUMAB 60 MG/ML ~~LOC~~ SOSY
60.0000 mg | PREFILLED_SYRINGE | Freq: Once | SUBCUTANEOUS | Status: AC
  Administered 2022-09-09: 60 mg via SUBCUTANEOUS

## 2022-09-09 NOTE — Progress Notes (Signed)
Patient present for Prolia Inj  Given on Left arm  Patient tolerated well

## 2022-09-11 ENCOUNTER — Ambulatory Visit: Payer: Medicare HMO | Admitting: Internal Medicine

## 2022-09-17 ENCOUNTER — Ambulatory Visit: Payer: Medicare HMO | Admitting: Family Medicine

## 2022-09-30 ENCOUNTER — Telehealth: Payer: Self-pay

## 2022-09-30 NOTE — Patient Outreach (Signed)
  Care Coordination   09/30/2022 Name: Lisa Greene MRN: [id number] DOB: 12-Jul-1940   Care Coordination Outreach Attempts:  An unsuccessful telephone outreach was attempted today to offer the patient information about available care coordination services.  Follow Up Plan:  Additional outreach attempts will be made to offer the patient care coordination information and services.   Encounter Outcome:  No Answer   Care Coordination Interventions:  Yes, provided    Bevelyn Ngo, BSW, CDP Social Worker, Certified Dementia Practitioner [facility] Care Management  Care Coordination [phone]

## 2022-10-02 ENCOUNTER — Ambulatory Visit: Payer: Self-pay

## 2022-10-02 NOTE — Patient Instructions (Signed)
Visit Information  Thank you for taking time to visit with me today. Please don't hesitate to contact me if I can be of assistance to you.   Following are the goals we discussed today:  -Contact your primary care provider as needed   If you are experiencing a Mental Health or Behavioral Health Crisis or need someone to talk to, please call 1-800-273-TALK (toll free, 24 hour hotline)  Patient verbalizes understanding of instructions and care plan provided today and agrees to view in MyChart. Active MyChart status and patient understanding of how to access instructions and care plan via MyChart confirmed with patient.     No further follow up required: Please contact the care coordination team as needed  Katreena Schupp, BSW, CDP Social Worker, Certified Dementia Practitioner [facility] Care Management  Care Coordination [phone]          

## 2022-10-02 NOTE — Patient Outreach (Signed)
  Care Coordination   Initial Visit Note   10/02/2022 Name: Lisa Greene MRN: [id number] DOB: 14-Jun-1940  Lisa Greene is a 82 y.o. year old female who sees Durene Cal, Aldine Contes, MD for primary care. I spoke with  [name] by phone today.  What matters to the patients health and wellness today?  No concerns at this time, patient remains active with gardening. She reports she is doing well at home.    Goals Addressed             This Visit's Progress    COMPLETED: Care Coordination Team       Care Coordination Interventions: SDoH screening performed - no acute resource needs identified at this time Determined the patient does not have concerns with medication costs and/or adherence at this time Education provided on the role of the care coordination team - no follow up desired at this time Encouraged the patient to contact her primary care provider as needed         SDOH assessments and interventions completed:  Yes  SDOH Interventions Today    Flowsheet Row Most Recent Value  SDOH Interventions   Food Insecurity Interventions Intervention Not Indicated  Housing Interventions Intervention Not Indicated  Transportation Interventions Intervention Not Indicated  Utilities Interventions Intervention Not Indicated        Care Coordination Interventions:  Yes, provided   Interventions Today    Flowsheet Row Most Recent Value  Chronic Disease   Chronic disease during today's visit Hypertension (HTN), Chronic Kidney Disease/End Stage Renal Disease (ESRD)  General Interventions   General Interventions Discussed/Reviewed General Interventions Discussed, Doctor Visits  Doctor Visits Discussed/Reviewed Doctor Visits Reviewed  Education Interventions   Education Provided Provided Education  Provided Verbal Education On Other  [role of the care coordination team]       Follow up plan: No further intervention required.   Encounter Outcome:  Pt. Visit Completed    Bevelyn Ngo, BSW, CDP Social Worker, Certified Dementia Practitioner [facility] Care Management  Care Coordination [phone]

## 2022-10-05 NOTE — Progress Notes (Unsigned)
[facility] [address] [place] [address] Phone: [phone] Subjective:   INadine Counts, am serving as a scribe for Dr. Antoine Primas.  I'm seeing this patient by the request  of:  Shelva Majestic, MD  CC: Low back pain follow-up  [id number]  08/11/2022 Has responded extremely well to the kyphoplasty. Discussed with patient that with his fracture and the bone density increase activity slowly. Follow-up with me again in 6 to 8 weeks.   Patient does have facet arthropathy noted. Discussed with patient about this and where patient's pain is now. Believe that the kyphoplasty did work for the compression fracture that was noted previously. Discussed with patient about icing regimen and home exercises otherwise. Patient would like to try the facet injections to see if this will help with some of the chronic discomfort that she is having. We discussed icing regimen and home exercises, which activities to do and which ones to avoid. Follow-up with me again in 6 to 8 weeks total time reviewing the chart as well as discussing with patient patient's imaging 32 minutes   Patient does have the osteoporosis. The patient is awaiting for approval for Prolia. I do think that this will be beneficial. Will likely need it lifelong   Updated 10/07/2022 ELIZABETHANN OBANION is a 82 y.o. female coming in with complaint of back pain patient did have bilateral facet injections at L4-L5 on March 27.  Patient had undergone kyphoplasty as well in December 2023.  Patient states epidural did help. Taking tylenol when needed      Past Medical History:  Diagnosis Date   ALLERGIC RHINITIS 01/31/2008   Asthma    BCC (basal cell carcinoma)sup& nod 03/01/2017   right nostril   Breast cancer of lower-inner quadrant of left female breast (HCC) 11/01/2015   treated with lumpectomy and radiation   Cataract    Colon cancer screening 05/21/2014   No polyps at age 24. Diverticulosis  alone-no more colonoscopies.     DIVERTICULITIS, HX OF 09/07/2008   pt denies this    Eosinophilia 02/08/2009   Fibroid    HYPERLIPIDEMIA 12/20/2006   Hypertension    OSTEOPOROSIS 12/20/2006   PVC's (premature ventricular contractions)    SCC (squamous cell carcinoma) well diff 11/28/2013   left side chin   Vitamin B 12 deficiency    Past Surgical History:  Procedure Laterality Date   BREAST LUMPECTOMY WITH NEEDLE LOCALIZATION Left 01/03/2016   Procedure: BREAST re-excision LUMPECTOMY WITH NEEDLE LOCALIZATION;  Surgeon: Emelia Loron, MD;  Location: [facility];  Service: General;  Laterality: Left;  BREAST re-excision LUMPECTOMY WITH NEEDLE LOCALIZATION   BREAST LUMPECTOMY WITH RADIOACTIVE SEED LOCALIZATION Left 11/28/2015   Procedure: LEFT BREAST LUMPECTOMY WITH BRACKETED RADIOACTIVE SEED LOCALIZATION;  Surgeon: Emelia Loron, MD;  Location: [facility];  Service: General;  Laterality: Left;   BREAST SURGERY     cysts removed x2   CATARACT EXTRACTION BILATERAL W/ ANTERIOR VITRECTOMY  2013   bilateral cataracts   COLONOSCOPY  2005   tics only    FOOT SURGERY     IR KYPHO LUMBAR INC FX REDUCE BONE BX UNI/BIL CANNULATION INC/IMAGING  05/28/2022   IR RADIOLOGIST EVAL & MGMT  05/21/2022   KNEE ARTHROSCOPY     left   NEUROMA SURGERY     x2 feet   thyroid duct cyst     Social History   Socioeconomic History   Marital status: Married  Spouse name: Not on file   Number of children: Not on file   Years of education: Not on file   Highest education level: Not on file  Occupational History   Occupation: retired  Tobacco Use   Smoking status: Never   Smokeless tobacco: Never  Vaping Use   Vaping Use: Never used  Substance and Sexual Activity   Alcohol use: No    Alcohol/week: 0.0 standard drinks of alcohol   Drug use: No   Sexual activity: Yes    Partners: Male    Birth control/protection: Post-menopausal  Other Topics Concern   Not on file   Social History Narrative   Married. No kids- 2 step kids. 5 step grandkids.  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[employer])      Hobbies: beach, read, crochet, knit, watch tv   Social Determinants of Health   Financial Resource Strain: Low Risk  (11/18/2021)   Overall Financial Resource Strain (CARDIA)    Difficulty of Paying Living Expenses: Not hard at all  Food Insecurity: No Food Insecurity (10/02/2022)   Hunger Vital Sign    Worried About Running Out of Food in the Last Year: Never true    Ran Out of Food in the Last Year: Never true  Transportation Needs: No Transportation Needs (10/02/2022)   PRAPARE - Administrator, Civil Service (Medical): No    Lack of Transportation (Non-Medical): No  Physical Activity: Sufficiently Active (11/18/2021)   Exercise Vital Sign    Days of Exercise per Week: 6 days    Minutes of Exercise per Session: 30 min  Stress: No Stress Concern Present (11/18/2021)   [employer] of Occupational Health - Occupational Stress Questionnaire    Feeling of Stress : Not at all  Social Connections: Moderately Integrated (11/18/2021)   Social Connection and Isolation Panel [NHANES]    Frequency of Communication with Friends and Family: More than three times a week    Frequency of Social Gatherings with Friends and Family: More than three times a week    Attends Religious Services: More than 4 times per year    Active Member of [employer] or Organizations: No    Attends Engineer, structural: Never    Marital Status: Married   Allergies  Allergen Reactions   Lisinopril Other (See Comments) and Cough    Dry cough and stinging rash   Latex Rash   Penicillins Rash    Has patient had a PCN reaction causing immediate rash, facial/tongue/throat swelling, SOB or lightheadedness with hypotension: Yes Has patient had a PCN reaction causing severe rash involving mucus membranes or skin necrosis: No Has patient  had a PCN reaction that required hospitalization No Has patient had a PCN reaction occurring within the last 10 years: No If all of the above answers are "NO", then may proceed with Cephalosporin use.    Sulfonamide Derivatives Rash   Tape Rash    Band-aids break out skin!!   Family History  Problem Relation Age of Onset   Brain cancer Mother    COPD Father    Heart disease Father        stent placed 24   Allergic rhinitis Father    COPD Brother        died of flu/double pna age 35   Healthy Brother        lives local- sees Dr. Durene Cal   Arthritis Brother  back issues   Colon cancer Neg Hx    Stomach cancer Neg Hx     Current Outpatient Medications (Endocrine & Metabolic):    PROLIA 60 MG/ML SOSY injection, Inject into the skin every 6 (six) months.  Current Outpatient Medications (Cardiovascular):    atorvastatin (LIPITOR) 20 MG tablet, Take 1 tablet (20 mg total) by mouth daily.   hydrochlorothiazide (MICROZIDE) 12.5 MG capsule, Take 1 capsule (12.5 mg total) by mouth every Monday, Wednesday, and Friday.   irbesartan (AVAPRO) 150 MG tablet, TAKE ONE TABLET BY MOUTH DAILY   metoprolol succinate (TOPROL-XL) 50 MG 24 hr tablet, Take 1 tablet (50 mg total) by mouth daily.  Current Outpatient Medications (Respiratory):    Albuterol Sulfate (PROAIR RESPICLICK) 108 (90 Base) MCG/ACT AEPB, Inhale 1 puff into the lungs every 8 (eight) hours as needed (wheezing/shortness of breath).   BREO ELLIPTA 200-25 MCG/INH AEPB, Inhale 1 puff into the lungs daily.   cetirizine (ZYRTEC) 10 MG tablet, Take 10 mg by mouth daily.   fluticasone (FLONASE) 50 MCG/ACT nasal spray, Place 1 spray into both nostrils daily as needed.    montelukast (SINGULAIR) 10 MG tablet, Take 1 tablet by mouth at bedtime.   Current Outpatient Medications (Hematological):    Cyanocobalamin (VITAMIN B 12 PO), Take 1,000 mg by mouth every other day.  Current Outpatient Medications (Other):    azelastine (OPTIVAR)  0.05 % ophthalmic solution,    calcium citrate (CALCITRATE - DOSED IN MG ELEMENTAL CALCIUM) 950 (200 Ca) MG tablet, Take 200 mg of elemental calcium by mouth daily.   cholecalciferol (VITAMIN D) 1000 UNITS tablet, Take 2,000 Units by mouth daily.   Reviewed prior external information including notes and imaging from  primary care provider As well as notes that were available from care everywhere and other healthcare systems.  Past medical history, social, surgical and family history all reviewed in electronic medical record.  No pertanent information unless stated regarding to the chief complaint.   Review of Systems:  No headache, visual changes, nausea, vomiting, diarrhea, constipation, dizziness, abdominal pain, skin rash, fevers, chills, night sweats, weight loss, swollen lymph nodes, body aches, joint swelling, chest pain, shortness of breath, mood changes. POSITIVE muscle aches  Objective  Blood pressure 116/70, pulse 70, height 5\' 1"  (1.549 m), weight 150 lb (68 kg), SpO2 98 %.   General: No apparent distress alert and oriented x3 mood and affect normal, dressed appropriately.  HEENT: Pupils equal, extraocular movements intact  Respiratory: Patient's speak in full sentences and does not appear short of breath  Cardiovascular: No lower extremity edema, non tender, no erythema  Low back exam does have some loss lordosis noted.  Some tenderness to palpation in the paraspinal musculature.  Tightness with extension of the back noted.  Anything greater than 5 degrees no radicular symptoms.    Impression and Recommendations:      The above documentation has been reviewed and is accurate and complete Judi Saa, DO

## 2022-10-06 ENCOUNTER — Other Ambulatory Visit: Payer: Self-pay | Admitting: Family Medicine

## 2022-10-07 ENCOUNTER — Ambulatory Visit: Payer: Medicare HMO | Admitting: Family Medicine

## 2022-10-07 ENCOUNTER — Encounter: Payer: Self-pay | Admitting: Family Medicine

## 2022-10-07 VITALS — BP 116/70 | HR 70 | Ht 61.0 in | Wt 150.0 lb

## 2022-10-07 DIAGNOSIS — M545 Low back pain, unspecified: Secondary | ICD-10-CM

## 2022-10-07 DIAGNOSIS — M47816 Spondylosis without myelopathy or radiculopathy, lumbar region: Secondary | ICD-10-CM | POA: Diagnosis not present

## 2022-10-07 NOTE — Patient Instructions (Addendum)
[facility] [phone] Call Today   See you again in 6 weeks after RFA

## 2022-10-07 NOTE — Assessment & Plan Note (Signed)
Facet arthropathy noted.  Patient did respond well to the facet injections but would like to see if patient would respond to medial branch block and see if she would be a candidate for the possibility of radiofrequency ablation.  Will see how patient responds.  Patient will follow-up with me again in 6 to 8 weeks otherwise.

## 2022-10-16 ENCOUNTER — Encounter: Payer: Self-pay | Admitting: Family Medicine

## 2022-10-30 ENCOUNTER — Other Ambulatory Visit: Payer: Self-pay | Admitting: Family Medicine

## 2022-10-30 DIAGNOSIS — M545 Low back pain, unspecified: Secondary | ICD-10-CM

## 2022-11-05 ENCOUNTER — Other Ambulatory Visit: Payer: Self-pay | Admitting: Internal Medicine

## 2022-11-06 ENCOUNTER — Ambulatory Visit (INDEPENDENT_AMBULATORY_CARE_PROVIDER_SITE_OTHER): Payer: Medicare HMO | Admitting: Family Medicine

## 2022-11-06 ENCOUNTER — Encounter: Payer: Self-pay | Admitting: Family Medicine

## 2022-11-06 VITALS — BP 126/70 | HR 84 | Temp 97.6°F | Ht 61.0 in | Wt 150.4 lb

## 2022-11-06 DIAGNOSIS — R002 Palpitations: Secondary | ICD-10-CM | POA: Diagnosis not present

## 2022-11-06 DIAGNOSIS — R319 Hematuria, unspecified: Secondary | ICD-10-CM | POA: Diagnosis not present

## 2022-11-06 DIAGNOSIS — N390 Urinary tract infection, site not specified: Secondary | ICD-10-CM | POA: Diagnosis not present

## 2022-11-06 DIAGNOSIS — I493 Ventricular premature depolarization: Secondary | ICD-10-CM | POA: Diagnosis not present

## 2022-11-06 DIAGNOSIS — R300 Dysuria: Secondary | ICD-10-CM | POA: Diagnosis not present

## 2022-11-06 LAB — POCT URINALYSIS DIPSTICK
Bilirubin, UA: NEGATIVE
Glucose, UA: NEGATIVE
Ketones, UA: NEGATIVE
Nitrite, UA: NEGATIVE
Protein, UA: POSITIVE — AB
Spec Grav, UA: 1.015 (ref 1.010–1.025)
Urobilinogen, UA: 0.2 E.U./dL — AB
pH, UA: 6 (ref 5.0–8.0)

## 2022-11-06 MED ORDER — CEPHALEXIN 500 MG PO CAPS: 500.0000 mg | ORAL_CAPSULE | Freq: Two times a day (BID) | ORAL | 0 refills | Status: DC

## 2022-11-06 NOTE — Patient Instructions (Addendum)
Start keflex for urinary infection. If change in meds needed, I can send in Cipro, but that has some possible side effects. See other info below. Return to the clinic or go to the nearest emergency room if any of your symptoms worsen or new symptoms occur.  EKG today shows PVCs again.  These appear similar to your previous EKG.  See information below.  If you continue to have frequent PVCs I would recommend contacting your cardiologist.  Continue to stay hydrated, avoid caffeine and be cautious with chocolate or other triggers.  See below.  If any worsening symptoms be seen.  Thanks for coming in today and hope you feel better soon.  Premature Ventricular Contraction  A premature ventricular contraction (PVC) is a type of irregular heartbeat (arrhythmia). The heart has four chambers, including the upper chambers (atria) and lower chambers (ventricles). Normally, an electrical signal starts in a group of cells called the sinoatrial node (SA node) and travels through the atria, causing them to pump blood into the ventricles. During a PVC, the heartbeat starts in one of the lower ventricles. This may cause the heartbeat to be shorter and less effective. In most cases, PVCs come and go and do not require treatment. What are the causes? Common causes of the condition include: Heart attack or coronary artery disease (CAD). Heart valve problems. Heart surgery. Infection of the heart (myocarditis). Inflammation of the heart. In many cases, the cause of this condition is not known. What increases the risk? The following factors may make you more likely to develop this condition: Age, especially being over age 5. Being female. An imbalance of salts and minerals in the body (electrolytes). Low blood oxygen levels or high carbon dioxide levels. Certain medicines, including over-the-counter and prescribed medicines. High blood pressure. Obesity. Episodes may be triggered by: Vigorous exercise. Tobacco,  alcohol, or caffeine use. Illegal drug use. Emotional stress. Poor or irregular sleep. What are the signs or symptoms? The main symptoms of this condition are fast or irregular heartbeats (palpitations) or the feeling of a pause in the heartbeat. Other symptoms include: Shortness of breath. Difficulty exercising. Chest pain. Feeling tired. Dizziness. In some cases, there are no symptoms. How is this diagnosed? This condition may be diagnosed based on: Your medical history or symptoms. A physical exam. Your health care provider may listen to your heart. Tests, such as: Blood tests. An ECG (electrocardiogram) to monitor the electrical activity of your heart. An ambulatory cardiac monitor that records your heartbeats for 24 hours or more. Stress tests to see how exercise affects your heart rhythm and blood supply. An echocardiogram, which creates an image of your heart. An electrophysiology study (EPS) to check for electrical problems in your heart. How is this treated? Treatment for this condition depends on any underlying conditions, the type of PVC, how many PVCs you have had, and if the symptoms are affecting your daily life. Possible treatments include: Avoiding things that cause PVCs (triggers). These include caffeine, tobacco, and alcohol. Taking medicines if symptoms are severe or if the arrhythmias happen a lot. Getting treatment for underlying conditions that cause PVCs. Having an implantable cardioverter defibrillator (ICD) placed in the chest to monitor the heartbeat. The monitor sends a shock to the heart if it senses an arrhythmia and brings the heartbeat back to normal. Having a catheter ablation procedure to destroy the part of the heart tissue that sends abnormal signals. In many cases, no treatment is required. Follow these instructions at home: Lifestyle  Do not use any products that contain nicotine or tobacco. These products include cigarettes, chewing tobacco,  and vaping devices, such as e-cigarettes. If you need help quitting, ask your health care provider. Do not use illegal drugs. Exercise regularly. Ask your health care provider what type of exercise is safe for you. Try to get at least 7-9 hours of sleep each night. Find healthy ways to manage stress. Avoid stressful situations when possible. Alcohol use Do not drink alcohol if: Your health care provider tells you not to drink. You are pregnant, may be pregnant, or are planning to become pregnant. Alcohol triggers your episodes. If you drink alcohol: Limit how much you have to: 0-1 drink a day for women. 0-2 drinks a day for men. Know how much alcohol is in your drink. In the [place] one drink equals one 12 oz bottle of beer (355 mL), one 5 oz glass of wine (148 mL), or one 1 oz glass of hard liquor (44 mL). General instructions Take over-the-counter and prescription medicines only as told by your health care provider. If caffeine triggers episodes of PVC, do not eat, drink, or use anything with caffeine in it. Contact a health care provider if: You feel palpitations often. You have nausea and vomiting. Get help right away if: You have chest pain. You have trouble breathing. You start sweating for no reason. You become light-headed or you faint. These symptoms may be an emergency. Get help right away. Call 911. Do not wait to see if the symptoms will go away. Do not drive yourself to the hospital. This information is not intended to replace advice given to you by your health care provider. Make sure you discuss any questions you have with your health care provider. Document Revised: 10/17/2021 Document Reviewed: 10/17/2021 Elsevier Patient Education  2024 Elsevier Inc.    Urinary Tract Infection, Adult  A urinary tract infection (UTI) is an infection of any part of the urinary tract. The urinary tract includes the kidneys, ureters, bladder, and urethra. These organs make, store,  and get rid of urine in the body. An upper UTI affects the ureters and kidneys. A lower UTI affects the bladder and urethra. What are the causes? Most urinary tract infections are caused by bacteria in your genital area around your urethra, where urine leaves your body. These bacteria grow and cause inflammation of your urinary tract. What increases the risk? You are more likely to develop this condition if: You have a urinary catheter that stays in place. You are not able to control when you urinate or have a bowel movement (incontinence). You are female and you: Use a spermicide or diaphragm for birth control. Have low estrogen levels. Are pregnant. You have certain genes that increase your risk. You are sexually active. You take antibiotic medicines. You have a condition that causes your flow of urine to slow down, such as: An enlarged prostate, if you are female. Blockage in your urethra. A kidney stone. A nerve condition that affects your bladder control (neurogenic bladder). Not getting enough to drink, or not urinating often. You have certain medical conditions, such as: Diabetes. A weak disease-fighting system (immunesystem). Sickle cell disease. Gout. Spinal cord injury. What are the signs or symptoms? Symptoms of this condition include: Needing to urinate right away (urgency). Frequent urination. This may include small amounts of urine each time you urinate. Pain or burning with urination. Blood in the urine. Urine that smells bad or unusual. Trouble urinating. Cloudy urine. Vaginal discharge,  if you are female. Pain in the abdomen or the lower back. You may also have: Vomiting or a decreased appetite. Confusion. Irritability or tiredness. A fever or chills. Diarrhea. The first symptom in older adults may be confusion. In some cases, they may not have any symptoms until the infection has worsened. How is this diagnosed? This condition is diagnosed based on your  medical history and a physical exam. You may also have other tests, including: Urine tests. Blood tests. Tests for STIs (sexually transmitted infections). If you have had more than one UTI, a cystoscopy or imaging studies may be done to determine the cause of the infections. How is this treated? Treatment for this condition includes: Antibiotic medicine. Over-the-counter medicines to treat discomfort. Drinking enough water to stay hydrated. If you have frequent infections or have other conditions such as a kidney stone, you may need to see a health care provider who specializes in the urinary tract (urologist). In rare cases, urinary tract infections can cause sepsis. Sepsis is a life-threatening condition that occurs when the body responds to an infection. Sepsis is treated in the hospital with IV antibiotics, fluids, and other medicines. Follow these instructions at home:  Medicines Take over-the-counter and prescription medicines only as told by your health care provider. If you were prescribed an antibiotic medicine, take it as told by your health care provider. Do not stop using the antibiotic even if you start to feel better. General instructions Make sure you: Empty your bladder often and completely. Do not hold urine for long periods of time. Empty your bladder after sex. Wipe from front to back after urinating or having a bowel movement if you are female. Use each tissue only one time when you wipe. Drink enough fluid to keep your urine pale yellow. Keep all follow-up visits. This is important. Contact a health care provider if: Your symptoms do not get better after 1-2 days. Your symptoms go away and then return. Get help right away if: You have severe pain in your back or your lower abdomen. You have a fever or chills. You have nausea or vomiting. Summary A urinary tract infection (UTI) is an infection of any part of the urinary tract, which includes the kidneys, ureters,  bladder, and urethra. Most urinary tract infections are caused by bacteria in your genital area. Treatment for this condition often includes antibiotic medicines. If you were prescribed an antibiotic medicine, take it as told by your health care provider. Do not stop using the antibiotic even if you start to feel better. Keep all follow-up visits. This is important. This information is not intended to replace advice given to you by your health care provider. Make sure you discuss any questions you have with your health care provider. Document Revised: 12/24/2019 Document Reviewed: 12/29/2019 Elsevier Patient Education  2024 [employer].

## 2022-11-06 NOTE — Progress Notes (Signed)
Subjective:  Patient ID: Lisa Greene, female    DOB: [birth year]  Age: 82 y.o. MRN: [id number]  CC:  Chief Complaint  Patient presents with   Urinary Frequency    Pt notes frequent urination a few weeks ago then went away and is now experiencing those sxs again frequent urination and low volume, notes a little burning at the end of urination     HPI Lisa Greene presents for   Urinary frequency As above symptoms started few weeks ago with frequent urination - noticed about every 15 min for about 8 hours, then resolved on own. Some dysuria at that time, resolved.  Ok until this am, then this morning then symptoms returned - frequent urination, small amounts, and burning at the end of urination.  No retention. No fever/chills. No n/v. No abd pain, no new back pain - hx of chronic back pain. Feels a little fatigued today.  No hx of nephroliths. Drinking fluids, water. No otc tx.   Last urine culture March 15 with mixed genital flora, Pseudomonas February 28 on culture.  Sulfa and pcn allergy, but has tolerated Keflex. Changed to cipro in February for pseudomonas. No side effects.   Heart racing Jittery or racing feeling past week. Had been taking tylenol arthritis. Less with stopping tylenol, but still happens. Comes and goes. No chest pain, dyspnea, syncope or near syncope. Feels fast heart rate in office.  History of PVC.  Cardiology visit 3/26 - hx of PVC"s. On toprol, avapro, microzide for htn. EP eval in 2022 - PVC's with preserved EF.   No caffeine, sleeping well. No new supplements. Some chocolate candy at times. No recent increase.      History Patient Active Problem List   Diagnosis Date Noted   Lumbar facet arthropathy 08/11/2022   Closed compression fracture of L2 lumbar vertebra, with routine healing, subsequent encounter 08/11/2022   HSV-1 (herpes simplex virus 1) infection 04/28/2022   Pain in left ankle and joints of left foot 06/18/2021   Vasomotor rhinitis  05/10/2019   Chronic allergic conjunctivitis 05/10/2019   PVC (premature ventricular contraction) 04/14/2017   Aortic atherosclerosis (HCC) 11/23/2016   Low vitamin B12 level 06/24/2016   History of breast cancer- Breast cancer of lower-inner quadrant of left female breast 11/01/2015   Stage 3b chronic kidney disease (HCC) 10/22/2015   Family history of abdominal aortic aneurysm 03/08/2015   Asthma, moderate persistent, well-controlled 04/19/2014   Hypertension 11/25/2010   Eosinophilia 02/08/2009   Allergic rhinitis 01/31/2008   Hyperlipidemia 12/20/2006   Osteoporosis 12/20/2006   Past Medical History:  Diagnosis Date   ALLERGIC RHINITIS 01/31/2008   Asthma    BCC (basal cell carcinoma)sup& nod 03/01/2017   right nostril   Breast cancer of lower-inner quadrant of left female breast (HCC) 11/01/2015   treated with lumpectomy and radiation   Cataract    Colon cancer screening 05/21/2014   No polyps at age 41. Diverticulosis alone-no more colonoscopies.     DIVERTICULITIS, HX OF 09/07/2008   pt denies this    Eosinophilia 02/08/2009   Fibroid    HYPERLIPIDEMIA 12/20/2006   Hypertension    OSTEOPOROSIS 12/20/2006   PVC's (premature ventricular contractions)    SCC (squamous cell carcinoma) well diff 11/28/2013   left side chin   Vitamin B 12 deficiency    Past Surgical History:  Procedure Laterality Date   BREAST LUMPECTOMY WITH NEEDLE LOCALIZATION Left 01/03/2016   Procedure: BREAST re-excision LUMPECTOMY WITH NEEDLE LOCALIZATION;  Surgeon: Emelia Loron, MD;  Location: [facility];  Service: General;  Laterality: Left;  BREAST re-excision LUMPECTOMY WITH NEEDLE LOCALIZATION   BREAST LUMPECTOMY WITH RADIOACTIVE SEED LOCALIZATION Left 11/28/2015   Procedure: LEFT BREAST LUMPECTOMY WITH BRACKETED RADIOACTIVE SEED LOCALIZATION;  Surgeon: Emelia Loron, MD;  Location: [facility];  Service: General;  Laterality: Left;   BREAST SURGERY     cysts  removed x2   CATARACT EXTRACTION BILATERAL W/ ANTERIOR VITRECTOMY  2013   bilateral cataracts   COLONOSCOPY  2005   tics only    FOOT SURGERY     IR KYPHO LUMBAR INC FX REDUCE BONE BX UNI/BIL CANNULATION INC/IMAGING  05/28/2022   IR RADIOLOGIST EVAL & MGMT  05/21/2022   KNEE ARTHROSCOPY     left   NEUROMA SURGERY     x2 feet   thyroid duct cyst     Allergies  Allergen Reactions   Lisinopril Other (See Comments) and Cough    Dry cough and stinging rash   Latex Rash   Penicillins Rash    Has patient had a PCN reaction causing immediate rash, facial/tongue/throat swelling, SOB or lightheadedness with hypotension: Yes Has patient had a PCN reaction causing severe rash involving mucus membranes or skin necrosis: No Has patient had a PCN reaction that required hospitalization No Has patient had a PCN reaction occurring within the last 10 years: No If all of the above answers are "NO", then may proceed with Cephalosporin use.    Sulfonamide Derivatives Rash   Tape Rash    Band-aids break out skin!!   Prior to Admission medications   Medication Sig Start Date End Date Taking? Authorizing Provider  Albuterol Sulfate (PROAIR RESPICLICK) 108 (90 Base) MCG/ACT AEPB Inhale 1 puff into the lungs every 8 (eight) hours as needed (wheezing/shortness of breath).   Yes [provider]  atorvastatin (LIPITOR) 20 MG tablet Take 1 tablet (20 mg total) by mouth daily. 08/25/22  Yes Chandrasekhar, Mahesh A, MD  azelastine (OPTIVAR) 0.05 % ophthalmic solution  05/05/19  Yes [provider]  BREO ELLIPTA 200-25 MCG/INH AEPB Inhale 1 puff into the lungs daily. 12/13/19  Yes [provider]  calcium citrate (CALCITRATE - DOSED IN MG ELEMENTAL CALCIUM) 950 (200 Ca) MG tablet Take 200 mg of elemental calcium by mouth daily.   Yes [provider]  cetirizine (ZYRTEC) 10 MG tablet Take 10 mg by mouth daily.   Yes [provider]  cholecalciferol (VITAMIN D) 1000 UNITS  tablet Take 2,000 Units by mouth daily.   Yes [provider]  Cyanocobalamin (VITAMIN B 12 PO) Take 1,000 mg by mouth every other day.   Yes [provider]  fluticasone (FLONASE) 50 MCG/ACT nasal spray Place 1 spray into both nostrils daily as needed.  10/08/14  Yes [provider]  hydrochlorothiazide (MICROZIDE) 12.5 MG capsule Take 1 capsule (12.5 mg total) by mouth every Monday, Wednesday, and Friday. 08/26/22  Yes Chandrasekhar, Mahesh A, MD  irbesartan (AVAPRO) 150 MG tablet TAKE ONE TABLET BY MOUTH DAILY 10/06/22  Yes Shelva Majestic, MD  metoprolol succinate (TOPROL-XL) 50 MG 24 hr tablet Take 1 tablet (50 mg total) by mouth daily. 08/25/22  Yes Chandrasekhar, Mahesh A, MD  montelukast (SINGULAIR) 10 MG tablet Take 1 tablet by mouth at bedtime. 08/30/15  Yes [provider]  PROLIA 60 MG/ML SOSY injection Inject into the skin every 6 (six) months. 08/24/22  Yes [provider]   Social History  Socioeconomic History   Marital status: Married    Spouse name: Not on file   Number of children: Not on file   Years of education: Not on file   Highest education level: Not on file  Occupational History   Occupation: retired  Tobacco Use   Smoking status: Never   Smokeless tobacco: Never  Vaping Use   Vaping Use: Never used  Substance and Sexual Activity   Alcohol use: No    Alcohol/week: 0.0 standard drinks of alcohol   Drug use: No   Sexual activity: Yes    Partners: Male    Birth control/protection: Post-menopausal  Other Topics Concern   Not on file  Social History Narrative   Married. No kids- 2 step kids. 5 step grandkids.  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[employer])      Hobbies: beach, read, crochet, knit, watch tv   Social Determinants of Health   Financial Resource Strain: Low Risk  (11/18/2021)   Overall Financial Resource Strain (CARDIA)    Difficulty of Paying Living Expenses: Not  hard at all  Food Insecurity: No Food Insecurity (10/02/2022)   Hunger Vital Sign    Worried About Running Out of Food in the Last Year: Never true    Ran Out of Food in the Last Year: Never true  Transportation Needs: No Transportation Needs (10/02/2022)   PRAPARE - Administrator, Civil Service (Medical): No    Lack of Transportation (Non-Medical): No  Physical Activity: Sufficiently Active (11/18/2021)   Exercise Vital Sign    Days of Exercise per Week: 6 days    Minutes of Exercise per Session: 30 min  Stress: No Stress Concern Present (11/18/2021)   [employer] of Occupational Health - Occupational Stress Questionnaire    Feeling of Stress : Not at all  Social Connections: Moderately Integrated (11/18/2021)   Social Connection and Isolation Panel [NHANES]    Frequency of Communication with Friends and Family: More than three times a week    Frequency of Social Gatherings with Friends and Family: More than three times a week    Attends Religious Services: More than 4 times per year    Active Member of [employer] or Organizations: No    Attends Banker Meetings: Never    Marital Status: Married  Catering manager Violence: Not At Risk (11/18/2021)   Humiliation, Afraid, Rape, and Kick questionnaire    Fear of Current or Ex-Partner: No    Emotionally Abused: No    Physically Abused: No    Sexually Abused: No    Review of Systems Per HPI  Objective:   Vitals:   11/06/22 1149  BP: 126/70  Pulse: 84  Temp: 97.6 F (36.4 C)  TempSrc: Temporal  SpO2: 95%  Weight: 150 lb 6.4 oz (68.2 kg)  Height: 5\' 1"  (1.549 m)     Physical Exam Vitals reviewed.  Constitutional:      Appearance: Normal appearance. She is well-developed.  HENT:     Head: Normocephalic and atraumatic.  Cardiovascular:     Rate and Rhythm: Rhythm irregular.     Comments: Normal rate but frequent ectopic beats. Pulmonary:     Effort: Pulmonary effort is normal.     Breath sounds:  Normal breath sounds.  Abdominal:     General: There is no distension.     Palpations: Abdomen is soft.     Tenderness: There is  no abdominal tenderness. There is no right CVA tenderness, left CVA tenderness, guarding or rebound.  Skin:    General: Skin is warm.  Neurological:     Mental Status: She is alert and oriented to person, place, and time.  Psychiatric:        Behavior: Behavior normal.     Results for orders placed or performed in visit on 11/06/22  POCT Urinalysis Dipstick  Result Value Ref Range   Color, UA yellow    Clarity, UA clear    Glucose, UA Negative Negative   Bilirubin, UA Neg    Ketones, UA Neg    Spec Grav, UA 1.015 1.010 - 1.025   Blood, UA moderate    pH, UA 6.0 5.0 - 8.0   Protein, UA Positive (A) Negative   Urobilinogen, UA 0.2 (A) 0.2 or 1.0 E.U./dL   Nitrite, UA Neg    Leukocytes, UA Large (3+) (A) Negative   Appearance     Odor     EKG, sinus rhythm with rate 83, frequent PVCs. Compared to 08/25/2022, no apparent acute changes.  PVCs noted at that time.  No appreciable changes.  Assessment & Plan:  Lisa Greene is a 82 y.o. female . Dysuria - Plan: POCT Urinalysis Dipstick, Urine CultureUrinary tract infection with hematuria, site unspecified - Plan: Urine Culture, cephALEXin (KEFLEX) 500 MG capsule  -Hemorrhagic cystitis likely.  Slight fatigue but no other acute systemic symptoms.  No sign of upper tract infection at this time.  Prior urine culture with Pseudomonas but subsequently had mixed genital flora  -Check urine culture, start Keflex initially, has tolerated previously with penicillin allergy noted.  Watch culture and consider Cipro if Pseudomonas, adjust meds accordingly.  Potential side effects and risks of quinolone antibiotics were discussed.  Start with Keflex initially, fluids, handout given with RTC/ER precautions.  Palpitations - Plan: EKG 12-Lead PVCs (premature ventricular contractions) - Plan: EKG 12-Lead  -Symptomatic,  PVCs noted again on in office EKG, similar to previous.  Denies ischemic symptoms at this time including no chest pain or dyspnea.  Trigger avoidance discussed, handout given, follow-up with cardiology if persistent with ER precautions given if new or worsening symptoms.  Meds ordered this encounter  Medications   cephALEXin (KEFLEX) 500 MG capsule    Sig: Take 1 capsule (500 mg total) by mouth 2 (two) times daily.    Dispense:  14 capsule    Refill:  0   Patient Instructions  Start keflex for urinary infection. If change in meds needed, I can send in Cipro, but that has some possible side effects. See other info below. Return to the clinic or go to the nearest emergency room if any of your symptoms worsen or new symptoms occur.  EKG today shows PVCs again.  These appear similar to your previous EKG.  See information below.  If you continue to have frequent PVCs I would recommend contacting your cardiologist.  Continue to stay hydrated, avoid caffeine and be cautious with chocolate or other triggers.  See below.  If any worsening symptoms be seen.  Thanks for coming in today and hope you feel better soon.  Premature Ventricular Contraction  A premature ventricular contraction (PVC) is a type of irregular heartbeat (arrhythmia). The heart has four chambers, including the upper chambers (atria) and lower chambers (ventricles). Normally, an electrical signal starts in a group of cells called the sinoatrial node (SA node) and travels through the atria, causing them to pump blood into the  ventricles. During a PVC, the heartbeat starts in one of the lower ventricles. This may cause the heartbeat to be shorter and less effective. In most cases, PVCs come and go and do not require treatment. What are the causes? Common causes of the condition include: Heart attack or coronary artery disease (CAD). Heart valve problems. Heart surgery. Infection of the heart (myocarditis). Inflammation of the  heart. In many cases, the cause of this condition is not known. What increases the risk? The following factors may make you more likely to develop this condition: Age, especially being over age 35. Being female. An imbalance of salts and minerals in the body (electrolytes). Low blood oxygen levels or high carbon dioxide levels. Certain medicines, including over-the-counter and prescribed medicines. High blood pressure. Obesity. Episodes may be triggered by: Vigorous exercise. Tobacco, alcohol, or caffeine use. Illegal drug use. Emotional stress. Poor or irregular sleep. What are the signs or symptoms? The main symptoms of this condition are fast or irregular heartbeats (palpitations) or the feeling of a pause in the heartbeat. Other symptoms include: Shortness of breath. Difficulty exercising. Chest pain. Feeling tired. Dizziness. In some cases, there are no symptoms. How is this diagnosed? This condition may be diagnosed based on: Your medical history or symptoms. A physical exam. Your health care provider may listen to your heart. Tests, such as: Blood tests. An ECG (electrocardiogram) to monitor the electrical activity of your heart. An ambulatory cardiac monitor that records your heartbeats for 24 hours or more. Stress tests to see how exercise affects your heart rhythm and blood supply. An echocardiogram, which creates an image of your heart. An electrophysiology study (EPS) to check for electrical problems in your heart. How is this treated? Treatment for this condition depends on any underlying conditions, the type of PVC, how many PVCs you have had, and if the symptoms are affecting your daily life. Possible treatments include: Avoiding things that cause PVCs (triggers). These include caffeine, tobacco, and alcohol. Taking medicines if symptoms are severe or if the arrhythmias happen a lot. Getting treatment for underlying conditions that cause PVCs. Having an  implantable cardioverter defibrillator (ICD) placed in the chest to monitor the heartbeat. The monitor sends a shock to the heart if it senses an arrhythmia and brings the heartbeat back to normal. Having a catheter ablation procedure to destroy the part of the heart tissue that sends abnormal signals. In many cases, no treatment is required. Follow these instructions at home: Lifestyle  Do not use any products that contain nicotine or tobacco. These products include cigarettes, chewing tobacco, and vaping devices, such as e-cigarettes. If you need help quitting, ask your health care provider. Do not use illegal drugs. Exercise regularly. Ask your health care provider what type of exercise is safe for you. Try to get at least 7-9 hours of sleep each night. Find healthy ways to manage stress. Avoid stressful situations when possible. Alcohol use Do not drink alcohol if: Your health care provider tells you not to drink. You are pregnant, may be pregnant, or are planning to become pregnant. Alcohol triggers your episodes. If you drink alcohol: Limit how much you have to: 0-1 drink a day for women. 0-2 drinks a day for men. Know how much alcohol is in your drink. In the [place] one drink equals one 12 oz bottle of beer (355 mL), one 5 oz glass of wine (148 mL), or one 1 oz glass of hard liquor (44 mL). General instructions Take over-the-counter and prescription  medicines only as told by your health care provider. If caffeine triggers episodes of PVC, do not eat, drink, or use anything with caffeine in it. Contact a health care provider if: You feel palpitations often. You have nausea and vomiting. Get help right away if: You have chest pain. You have trouble breathing. You start sweating for no reason. You become light-headed or you faint. These symptoms may be an emergency. Get help right away. Call 911. Do not wait to see if the symptoms will go away. Do not drive yourself to the  hospital. This information is not intended to replace advice given to you by your health care provider. Make sure you discuss any questions you have with your health care provider. Document Revised: 10/17/2021 Document Reviewed: 10/17/2021 Elsevier Patient Education  2024 Elsevier Inc.    Urinary Tract Infection, Adult  A urinary tract infection (UTI) is an infection of any part of the urinary tract. The urinary tract includes the kidneys, ureters, bladder, and urethra. These organs make, store, and get rid of urine in the body. An upper UTI affects the ureters and kidneys. A lower UTI affects the bladder and urethra. What are the causes? Most urinary tract infections are caused by bacteria in your genital area around your urethra, where urine leaves your body. These bacteria grow and cause inflammation of your urinary tract. What increases the risk? You are more likely to develop this condition if: You have a urinary catheter that stays in place. You are not able to control when you urinate or have a bowel movement (incontinence). You are female and you: Use a spermicide or diaphragm for birth control. Have low estrogen levels. Are pregnant. You have certain genes that increase your risk. You are sexually active. You take antibiotic medicines. You have a condition that causes your flow of urine to slow down, such as: An enlarged prostate, if you are female. Blockage in your urethra. A kidney stone. A nerve condition that affects your bladder control (neurogenic bladder). Not getting enough to drink, or not urinating often. You have certain medical conditions, such as: Diabetes. A weak disease-fighting system (immunesystem). Sickle cell disease. Gout. Spinal cord injury. What are the signs or symptoms? Symptoms of this condition include: Needing to urinate right away (urgency). Frequent urination. This may include small amounts of urine each time you urinate. Pain or burning  with urination. Blood in the urine. Urine that smells bad or unusual. Trouble urinating. Cloudy urine. Vaginal discharge, if you are female. Pain in the abdomen or the lower back. You may also have: Vomiting or a decreased appetite. Confusion. Irritability or tiredness. A fever or chills. Diarrhea. The first symptom in older adults may be confusion. In some cases, they may not have any symptoms until the infection has worsened. How is this diagnosed? This condition is diagnosed based on your medical history and a physical exam. You may also have other tests, including: Urine tests. Blood tests. Tests for STIs (sexually transmitted infections). If you have had more than one UTI, a cystoscopy or imaging studies may be done to determine the cause of the infections. How is this treated? Treatment for this condition includes: Antibiotic medicine. Over-the-counter medicines to treat discomfort. Drinking enough water to stay hydrated. If you have frequent infections or have other conditions such as a kidney stone, you may need to see a health care provider who specializes in the urinary tract (urologist). In rare cases, urinary tract infections can cause sepsis. Sepsis is a  life-threatening condition that occurs when the body responds to an infection. Sepsis is treated in the hospital with IV antibiotics, fluids, and other medicines. Follow these instructions at home:  Medicines Take over-the-counter and prescription medicines only as told by your health care provider. If you were prescribed an antibiotic medicine, take it as told by your health care provider. Do not stop using the antibiotic even if you start to feel better. General instructions Make sure you: Empty your bladder often and completely. Do not hold urine for long periods of time. Empty your bladder after sex. Wipe from front to back after urinating or having a bowel movement if you are female. Use each tissue only one time  when you wipe. Drink enough fluid to keep your urine pale yellow. Keep all follow-up visits. This is important. Contact a health care provider if: Your symptoms do not get better after 1-2 days. Your symptoms go away and then return. Get help right away if: You have severe pain in your back or your lower abdomen. You have a fever or chills. You have nausea or vomiting. Summary A urinary tract infection (UTI) is an infection of any part of the urinary tract, which includes the kidneys, ureters, bladder, and urethra. Most urinary tract infections are caused by bacteria in your genital area. Treatment for this condition often includes antibiotic medicines. If you were prescribed an antibiotic medicine, take it as told by your health care provider. Do not stop using the antibiotic even if you start to feel better. Keep all follow-up visits. This is important. This information is not intended to replace advice given to you by your health care provider. Make sure you discuss any questions you have with your health care provider. Document Revised: 12/24/2019 Document Reviewed: 12/29/2019 Elsevier Patient Education  2024 Elsevier Inc.     Signed,   Meredith Staggers, MD [facility], [facility] 11/06/22 12:47 PM

## 2022-11-08 LAB — URINE CULTURE
MICRO NUMBER:: 15055661
SPECIMEN QUALITY:: ADEQUATE

## 2022-11-13 ENCOUNTER — Ambulatory Visit (INDEPENDENT_AMBULATORY_CARE_PROVIDER_SITE_OTHER): Payer: Medicare HMO | Admitting: Family Medicine

## 2022-11-13 ENCOUNTER — Encounter: Payer: Self-pay | Admitting: Family Medicine

## 2022-11-13 VITALS — BP 138/79 | HR 60 | Wt 150.4 lb

## 2022-11-13 DIAGNOSIS — N3000 Acute cystitis without hematuria: Secondary | ICD-10-CM | POA: Diagnosis not present

## 2022-11-13 DIAGNOSIS — N390 Urinary tract infection, site not specified: Secondary | ICD-10-CM

## 2022-11-13 DIAGNOSIS — R319 Hematuria, unspecified: Secondary | ICD-10-CM

## 2022-11-13 LAB — POC URINALSYSI DIPSTICK (AUTOMATED)
Bilirubin, UA: NEGATIVE
Blood, UA: NEGATIVE
Glucose, UA: NEGATIVE
Ketones, UA: NEGATIVE
Leukocytes, UA: NEGATIVE
Nitrite, UA: NEGATIVE
Protein, UA: NEGATIVE
Spec Grav, UA: 1.02 (ref 1.010–1.025)
Urobilinogen, UA: 0.2 E.U./dL
pH, UA: 6 (ref 5.0–8.0)

## 2022-11-13 MED ORDER — CEPHALEXIN 500 MG PO CAPS: 500.0000 mg | ORAL_CAPSULE | Freq: Two times a day (BID) | ORAL | 0 refills | Status: DC

## 2022-11-13 NOTE — Patient Instructions (Addendum)
Return in about 2 weeks (around 11/27/2022), or if symptoms worsen or fail to improve.        Great to see you today.  I have refilled the medication(s) we provide.   If labs were collected, we will inform you of lab results once received either by echart message or telephone call.   - echart message- for normal results that have been seen by the patient already.   - telephone call: abnormal results or if patient has not viewed results in their echart.  

## 2022-11-13 NOTE — Progress Notes (Signed)
Lisa Greene , Jul 15, 1940, 82 y.o., female MRN: [id number] Patient Care Team    Relationship Specialty Notifications Start End  Shelva Majestic, MD PCP - General Family Medicine  04/19/14   Lyn Records, MD (Inactive) PCP - Cardiology Cardiology Admissions 03/22/18   Emelia Loron, MD Consulting Physician General Surgery  11/01/15   Serena Croissant, MD Consulting Physician Hematology and Oncology  11/01/15   Dorothy Puffer, MD Consulting Physician Radiation Oncology  11/01/15   Antony Contras, MD Consulting Physician Ophthalmology  03/16/19   Sidney Ace, MD Referring Physician Allergy  05/10/19   Glyn Ade, PA-C Physician Assistant Dermatology  06/19/21     Chief Complaint  Patient presents with   recent uti   Acute Visit    Possible uti. She was diagnosed on Tuesday and does not feel like its cleared up. She does not remember what medication she was prescribed.     Subjective: Lisa Greene is a 82 y.o. Pt presents for an OV with complaints of continued symptoms of her UTI.  She does not feel the keflex started a week has helped yet.  She reports she took her last pill today.  She is still having discomfort/pain at the end of her urination.  She still feeling a little off.  She denies any fevers or chills. E.coli UTI resistant to bactrim only > 11/06/2022.  Treated with Keflex 500 mg twice daily.  GFR approximately 35       11/06/2022   11:47 AM 08/14/2022   11:03 AM 07/14/2022    9:45 AM 11/18/2021   10:32 AM 07/08/2021    9:19 AM  Depression screen PHQ 2/9  Decreased Interest 0 0 0 0 0  Down, Depressed, Hopeless 0 0 0 0 0  PHQ - 2 Score 0 0 0 0 0  Altered sleeping 0 0 0  0  Tired, decreased energy 0 0 0  0  Change in appetite 0 0 0  0  Feeling bad or failure about yourself  0 0 0  0  Trouble concentrating 0 0 0  0  Moving slowly or fidgety/restless 0 0 0  0  Suicidal thoughts 0 0 0  0  PHQ-9 Score 0 0 0  0  Difficult doing work/chores  Not difficult at all  Not difficult at all  Not difficult at all    Allergies  Allergen Reactions   Lisinopril Other (See Comments) and Cough    Dry cough and stinging rash   Latex Rash   Penicillins Rash    Has patient had a PCN reaction causing immediate rash, facial/tongue/throat swelling, SOB or lightheadedness with hypotension: Yes Has patient had a PCN reaction causing severe rash involving mucus membranes or skin necrosis: No Has patient had a PCN reaction that required hospitalization No Has patient had a PCN reaction occurring within the last 10 years: No If all of the above answers are "NO", then may proceed with Cephalosporin use.    Sulfonamide Derivatives Rash   Tape Rash    Band-aids break out skin!!   Social History   Social History Narrative   Married. No kids- 2 step kids. 5 step grandkids.  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 Diverticulosis alone-no more colonoscopies.     DIVERTICULITIS, HX OF 09/07/2008   pt denies this    Eosinophilia 02/08/2009   Fibroid    HYPERLIPIDEMIA 12/20/2006   Hypertension    OSTEOPOROSIS 12/20/2006   PVC's (premature ventricular contractions)    SCC (squamous cell carcinoma) well diff 11/28/2013   left side chin   Vitamin B 12 deficiency    Past Surgical History:  Procedure Laterality Date   BREAST LUMPECTOMY WITH NEEDLE LOCALIZATION Left 01/03/2016   Procedure: BREAST re-excision LUMPECTOMY WITH NEEDLE LOCALIZATION;  Surgeon: Emelia Loron, MD;  Location: [facility];  Service: General;  Laterality:  Left;  BREAST re-excision LUMPECTOMY WITH NEEDLE LOCALIZATION   BREAST LUMPECTOMY WITH RADIOACTIVE SEED LOCALIZATION Left 11/28/2015   Procedure: LEFT BREAST LUMPECTOMY WITH BRACKETED RADIOACTIVE SEED LOCALIZATION;  Surgeon: Emelia Loron, MD;  Location: [facility];  Service: General;  Laterality: Left;   BREAST SURGERY     cysts removed x2   CATARACT EXTRACTION BILATERAL W/ ANTERIOR VITRECTOMY  2013   bilateral cataracts   COLONOSCOPY  2005   tics only    FOOT SURGERY     IR KYPHO LUMBAR INC FX REDUCE BONE BX UNI/BIL CANNULATION INC/IMAGING  05/28/2022   IR RADIOLOGIST EVAL & MGMT  05/21/2022   KNEE ARTHROSCOPY     left   NEUROMA SURGERY     x2 feet   thyroid duct cyst     Family History  Problem Relation Age of Onset   Brain cancer Mother    COPD Father    Heart disease Father        stent placed 36   Allergic rhinitis Father    COPD Brother        died of flu/double pna age 81   Healthy Brother        lives local- sees Dr. Durene Cal   Arthritis Brother        back issues   Colon cancer Neg Hx    Stomach cancer Neg Hx    Allergies as of 11/13/2022       Reactions   Lisinopril Other (See Comments), Cough   Dry cough and stinging rash   Latex Rash   Penicillins Rash   Has patient had a PCN reaction causing immediate rash, facial/tongue/throat swelling, SOB or lightheadedness with hypotension: Yes Has patient had a PCN reaction causing severe rash involving mucus membranes or skin necrosis: No Has patient had a PCN reaction that required hospitalization No Has patient had a PCN reaction occurring within the last 10 years: No If all of the above answers are "NO", then may proceed with Cephalosporin use.   Sulfonamide Derivatives Rash   Tape Rash   Band-aids break out skin!!        Medication List        Accurate as of November 13, 2022  3:40 PM. If you have any questions, ask your nurse or doctor.          Albuterol Sulfate 108 (90 Base) MCG/ACT  Aepb Commonly known as: PROAIR RESPICLICK Inhale 1 puff into the lungs every 8 (eight) hours as needed (wheezing/shortness of breath).   atorvastatin 20 MG tablet Commonly known as: LIPITOR  Take 1 tablet (20 mg total) by mouth daily.   azelastine 0.05 % ophthalmic solution Commonly known as: OPTIVAR   Breo Ellipta 200-25 MCG/ACT Aepb Generic drug: fluticasone furoate-vilanterol Inhale 1 puff into the lungs daily.   calcium citrate 950 (200 Ca) MG tablet Commonly known as: CALCITRATE - dosed in mg elemental calcium Take 200 mg of elemental calcium by mouth daily.   cephALEXin 500 MG capsule Commonly known as: KEFLEX Take 1 capsule (500 mg total) by mouth 2 (two) times daily.   cetirizine 10 MG tablet Commonly known as: ZYRTEC Take 10 mg by mouth daily.   cholecalciferol 1000 units tablet Commonly known as: VITAMIN D Take 2,000 Units by mouth daily.   fluticasone 50 MCG/ACT nasal spray Commonly known as: FLONASE Place 1 spray into both nostrils daily as needed.   hydrochlorothiazide 12.5 MG capsule Commonly known as: MICROZIDE Take 1 capsule (12.5 mg total) by mouth every Monday, Wednesday, and Friday.   irbesartan 150 MG tablet Commonly known as: AVAPRO TAKE ONE TABLET BY MOUTH DAILY   metoprolol succinate 50 MG 24 hr tablet Commonly known as: TOPROL-XL Take 1 tablet (50 mg total) by mouth daily.   montelukast 10 MG tablet Commonly known as: SINGULAIR Take 1 tablet by mouth at bedtime.   Prolia 60 MG/ML Sosy injection Generic drug: denosumab Inject into the skin every 6 (six) months.   VITAMIN B 12 PO Take 1,000 mg by mouth every other day.        All past medical history, surgical history, allergies, family history, immunizations andmedications were updated in the EMR today and reviewed under the history and medication portions of their EMR.     Review of Systems  Constitutional:  Negative for chills, fever and malaise/fatigue.  Genitourinary:   Positive for dysuria, frequency and urgency.  Skin:  Negative for rash.   Negative, with the exception of above mentioned in HPI   Objective:  BP 138/79   Pulse 60   Wt 150 lb 6.4 oz (68.2 kg)   SpO2 98%   BMI 28.42 kg/m  Body mass index is 28.42 kg/m. Physical Exam Vitals and nursing note reviewed.  Constitutional:      General: She is not in acute distress.    Appearance: Normal appearance. She is normal weight. She is not ill-appearing or toxic-appearing.  HENT:     Head: Normocephalic and atraumatic.  Eyes:     General: No scleral icterus.       Right eye: No discharge.        Left eye: No discharge.     Extraocular Movements: Extraocular movements intact.     Conjunctiva/sclera: Conjunctivae normal.     Pupils: Pupils are equal, round, and reactive to light.  Cardiovascular:     Rate and Rhythm: Normal rate and regular rhythm.  Pulmonary:     Effort: Pulmonary effort is normal.     Breath sounds: Normal breath sounds.  Abdominal:     General: Abdomen is flat. There is no distension.     Palpations: Abdomen is soft.     Tenderness: There is no abdominal tenderness. There is no right CVA tenderness or left CVA tenderness.  Skin:    Findings: No rash.  Neurological:     Mental Status: She is alert and oriented to person, place, and time. Mental status is at baseline.     Motor: No weakness.     Coordination: Coordination normal.     Gait: Gait normal.  Psychiatric:        Mood and Affect: Mood normal.        Behavior: Behavior normal.        Thought Content: Thought content normal.        Judgment: Judgment normal.      No results found. No results found. Results for orders placed or performed in visit on 11/13/22 (from the past 24 hour(s))  POCT Urinalysis Dipstick (Automated)     Status: None   Collection Time: 11/13/22  3:03 PM  Result Value Ref Range   Color, UA Yellow    Clarity, UA clear    Glucose, UA Negative Negative   Bilirubin, UA Negative     Ketones, UA Negative    Spec Grav, UA 1.020 1.010 - 1.025   Blood, UA Negative    pH, UA 6.0 5.0 - 8.0   Protein, UA Negative Negative   Urobilinogen, UA 0.2 0.2 or 1.0 E.U./dL   Nitrite, UA Negative    Leukocytes, UA Negative Negative    Assessment/Plan: Lisa Greene is a 82 y.o. female present for OV for  Acute cystitis without hematuria - POCT Urinalysis Dipstick (Automated)- poct negative - Urinalysis w microscopic + reflex cultur Her point-of-care urine today looks much improved from her prior urinalysis per EMR review. Extended her Keflex 500 mg twice daily (renally dosed) for another 3 days while we wait on final results of second urine culture. I encouraged her to rest and hydrate well. Patient will be called with results once received  Reviewed expectations re: course of current medical issues. Discussed self-management of symptoms. Outlined signs and symptoms indicating need for more acute intervention. Patient verbalized understanding and all questions were answered. Patient received an After-Visit Summary.    Orders Placed This Encounter  Procedures   Urinalysis w microscopic + reflex cultur   POCT Urinalysis Dipstick (Automated)   Meds ordered this encounter  Medications   cephALEXin (KEFLEX) 500 MG capsule    Sig: Take 1 capsule (500 mg total) by mouth 2 (two) times daily.    Dispense:  6 capsule    Refill:  0   Referral Orders  No referral(s) requested today     Note is dictated utilizing voice recognition software. Although note has been proof read prior to signing, occasional typographical errors still can be missed. If any questions arise, please do not hesitate to call for verification.   electronically signed by:  Felix Pacini, DO  [facility] [place]

## 2022-11-16 LAB — URINALYSIS W MICROSCOPIC + REFLEX CULTURE
Bacteria, UA: NONE SEEN /HPF
Bilirubin Urine: NEGATIVE
Glucose, UA: NEGATIVE
Hgb urine dipstick: NEGATIVE
Hyaline Cast: NONE SEEN /LPF
Ketones, ur: NEGATIVE
Nitrites, Initial: NEGATIVE
Protein, ur: NEGATIVE
RBC / HPF: NONE SEEN /HPF (ref 0–2)
Specific Gravity, Urine: 1.016 (ref 1.001–1.035)
Squamous Epithelial / HPF: NONE SEEN /HPF (ref <=5)
WBC, UA: NONE SEEN /HPF (ref 0–5)
pH: 5.5 (ref 5.0–8.0)

## 2022-11-16 LAB — URINE CULTURE
MICRO NUMBER:: 15087022
SPECIMEN QUALITY:: ADEQUATE

## 2022-11-16 LAB — CULTURE INDICATED

## 2022-11-16 NOTE — Discharge Instructions (Signed)
Medial Branch Block Discharge Instructions  Take over-the-counter and prescription medicines only as told by your health care provider.  Do not drive the day of your procedure  Return to your normal activities as told by your health care provider.   If injection site is sore you may ice the area for 20 minutes, 2-3 times a day.   Check your injection site every day for signs of infection. Check for: Redness, swelling, or pain. Fluid or blood. Warmth. Pus or a bad smell.  Please contact our office at [phone] if: You have a fever or chills. You have any signs of infection. You develop any numbness or weakness.   Thank you for visiting our office.  

## 2022-11-17 ENCOUNTER — Telehealth: Payer: Self-pay | Admitting: Family Medicine

## 2022-11-17 ENCOUNTER — Other Ambulatory Visit: Payer: Self-pay | Admitting: Family Medicine

## 2022-11-17 ENCOUNTER — Ambulatory Visit
Admission: RE | Admit: 2022-11-17 | Discharge: 2022-11-17 | Disposition: A | Discharge status: Home / Self Care | Payer: Medicare HMO | Source: Ambulatory Visit | Attending: Family Medicine | Admitting: Family Medicine

## 2022-11-17 DIAGNOSIS — M545 Low back pain, unspecified: Secondary | ICD-10-CM

## 2022-11-17 DIAGNOSIS — N390 Urinary tract infection, site not specified: Secondary | ICD-10-CM

## 2022-11-17 MED ORDER — CIPROFLOXACIN HCL 250 MG PO TABS: 250.0000 mg | ORAL_TABLET | Freq: Two times a day (BID) | ORAL | 0 refills | Status: AC

## 2022-11-17 NOTE — Telephone Encounter (Signed)
The following was to make sure she had cleared the urinary tract infection-you can order a urine culture-have her come by for this 2 to 3 days after finishing her antibiotic

## 2022-11-17 NOTE — Telephone Encounter (Signed)
Please inform patient her urine culture grew a different bacteria called Pseudomonas.  We need to switch her to Cipro antibiotic 2 times a day for 3 days.  Please have her follow-up with her PCP within the next few days after completing the antibiotic to ensure clearance of infection

## 2022-11-17 NOTE — Telephone Encounter (Signed)
Pt advised of results and new meds sent to pharmacy.

## 2022-11-17 NOTE — Telephone Encounter (Signed)
See below

## 2022-11-17 NOTE — Telephone Encounter (Signed)
LM for pt to return call to discuss.  

## 2022-11-17 NOTE — Telephone Encounter (Signed)
PT follow up scheduled for 12/22/22 due to no openings.  PT needs to be seen sooner due to previous message from [facility]. Please advise.

## 2022-11-18 NOTE — Telephone Encounter (Signed)
Patient is scheduled for Lab 11/23/22

## 2022-11-18 NOTE — Telephone Encounter (Signed)
Schedule lab visit for pt to leave a urine 2-3 days after she has finished antibiotic.

## 2022-11-23 ENCOUNTER — Other Ambulatory Visit: Payer: Medicare HMO

## 2022-11-23 DIAGNOSIS — R319 Hematuria, unspecified: Secondary | ICD-10-CM | POA: Diagnosis not present

## 2022-11-23 DIAGNOSIS — N390 Urinary tract infection, site not specified: Secondary | ICD-10-CM

## 2022-11-25 LAB — URINE CULTURE
MICRO NUMBER:: 15119030
Result:: NO GROWTH
SPECIMEN QUALITY:: ADEQUATE

## 2022-11-26 ENCOUNTER — Ambulatory Visit (INDEPENDENT_AMBULATORY_CARE_PROVIDER_SITE_OTHER): Payer: Medicare HMO

## 2022-11-26 VITALS — Wt 150.0 lb

## 2022-11-26 DIAGNOSIS — Z0000 Encounter for general adult medical examination without abnormal findings: Secondary | ICD-10-CM

## 2022-11-26 NOTE — Progress Notes (Signed)
Subjective:   Lisa Greene is a 82 y.o. female who presents for Medicare Annual (Subsequent) preventive examination.  Visit Complete: Virtual  I connected with  [name] on 11/26/22 by a audio enabled telemedicine application and verified that I am speaking with the correct person using two identifiers.  Patient Location: Home  Provider Location: Office/Clinic  I discussed the limitations of evaluation and management by telemedicine. The patient expressed understanding and agreed to proceed.  Review of Systems     Cardiac Risk Factors include: advanced age (>13men, >14 women);hypertension;dyslipidemia     Objective:    Today's Vitals   11/26/22 0903  Weight: 150 lb (68 kg)   Body mass index is 28.34 kg/m.     11/26/2022    9:08 AM 05/18/2022   12:20 PM 11/18/2021   10:32 AM 09/02/2020   12:10 PM 03/16/2019    2:54 PM 11/06/2016   11:45 AM 07/30/2016   12:45 PM  Advanced Directives  Does Patient Have a Medical Advance Directive? Yes No Yes Yes Yes Yes Yes  Type of Estate agent of [place];Living will  Healthcare Power of Attorney Living will;Healthcare Power of Attorney Living will;Healthcare Power of Attorney    Does patient want to make changes to medical advance directive?     No - Patient declined    Copy of Healthcare Power of Attorney in Chart? No - copy requested  No - copy requested No - copy requested No - copy requested    Would patient like information on creating a medical advance directive?  No - Patient declined         Current Medications (verified) Outpatient Encounter Medications as of 11/26/2022  Medication Sig   ABRYSVO 120 MCG/0.5ML injection    Albuterol Sulfate (PROAIR RESPICLICK) 108 (90 Base) MCG/ACT AEPB Inhale 1 puff into the lungs every 8 (eight) hours as needed (wheezing/shortness of breath).   atorvastatin (LIPITOR) 20 MG tablet Take 1 tablet (20 mg total) by mouth daily.   azelastine (OPTIVAR) 0.05 % ophthalmic  solution    BREO ELLIPTA 200-25 MCG/INH AEPB Inhale 1 puff into the lungs daily.   calcium citrate (CALCITRATE - DOSED IN MG ELEMENTAL CALCIUM) 950 (200 Ca) MG tablet Take 200 mg of elemental calcium by mouth daily.   cetirizine (ZYRTEC) 10 MG tablet Take 10 mg by mouth daily.   cholecalciferol (VITAMIN D) 1000 UNITS tablet Take 2,000 Units by mouth daily.   Cyanocobalamin (VITAMIN B 12 PO) Take 1,000 mg by mouth every other day.   fluticasone (FLONASE) 50 MCG/ACT nasal spray Place 1 spray into both nostrils daily as needed.    hydrochlorothiazide (MICROZIDE) 12.5 MG capsule Take 1 capsule (12.5 mg total) by mouth every Monday, Wednesday, and Friday.   irbesartan (AVAPRO) 150 MG tablet TAKE ONE TABLET BY MOUTH DAILY   metoprolol succinate (TOPROL-XL) 50 MG 24 hr tablet Take 1 tablet (50 mg total) by mouth daily.   montelukast (SINGULAIR) 10 MG tablet Take 1 tablet by mouth at bedtime.   PROLIA 60 MG/ML SOSY injection Inject into the skin every 6 (six) months.   No facility-administered encounter medications on file as of 11/26/2022.    Allergies (verified) Lisinopril, Latex, Penicillins, Sulfonamide derivatives, and Tape   History: Past Medical History:  Diagnosis Date   ALLERGIC RHINITIS 01/31/2008   Asthma    BCC (basal cell carcinoma)sup& nod 03/01/2017   right nostril   Breast cancer of lower-inner quadrant of left female breast (HCC)  11/01/2015   treated with lumpectomy and radiation   Cataract    Colon cancer screening 05/21/2014   No polyps at age 86. Diverticulosis alone-no more colonoscopies.     DIVERTICULITIS, HX OF 09/07/2008   pt denies this    Eosinophilia 02/08/2009   Fibroid    HYPERLIPIDEMIA 12/20/2006   Hypertension    OSTEOPOROSIS 12/20/2006   PVC's (premature ventricular contractions)    SCC (squamous cell carcinoma) well diff 11/28/2013   left side chin   Vitamin B 12 deficiency    Past Surgical History:  Procedure Laterality Date   BREAST LUMPECTOMY WITH  NEEDLE LOCALIZATION Left 01/03/2016   Procedure: BREAST re-excision LUMPECTOMY WITH NEEDLE LOCALIZATION;  Surgeon: Emelia Loron, MD;  Location: [facility];  Service: General;  Laterality: Left;  BREAST re-excision LUMPECTOMY WITH NEEDLE LOCALIZATION   BREAST LUMPECTOMY WITH RADIOACTIVE SEED LOCALIZATION Left 11/28/2015   Procedure: LEFT BREAST LUMPECTOMY WITH BRACKETED RADIOACTIVE SEED LOCALIZATION;  Surgeon: Emelia Loron, MD;  Location: [facility];  Service: General;  Laterality: Left;   BREAST SURGERY     cysts removed x2   CATARACT EXTRACTION BILATERAL W/ ANTERIOR VITRECTOMY  2013   bilateral cataracts   COLONOSCOPY  2005   tics only    FOOT SURGERY     IR KYPHO LUMBAR INC FX REDUCE BONE BX UNI/BIL CANNULATION INC/IMAGING  05/28/2022   IR RADIOLOGIST EVAL & MGMT  05/21/2022   KNEE ARTHROSCOPY     left   NEUROMA SURGERY     x2 feet   thyroid duct cyst     Family History  Problem Relation Age of Onset   Brain cancer Mother    COPD Father    Heart disease Father        stent placed 31   Allergic rhinitis Father    COPD Brother        died of flu/double pna age 51   Healthy Brother        lives local- sees Dr. Durene Cal   Arthritis Brother        back issues   Colon cancer Neg Hx    Stomach cancer Neg Hx    Social History   Socioeconomic History   Marital status: Married    Spouse name: Not on file   Number of children: Not on file   Years of education: Not on file   Highest education level: Not on file  Occupational History   Occupation: retired  Tobacco Use   Smoking status: Never   Smokeless tobacco: Never  Vaping Use   Vaping Use: Never used  Substance and Sexual Activity   Alcohol use: No    Alcohol/week: 0.0 standard drinks of alcohol   Drug use: No   Sexual activity: Yes    Partners: Male    Birth control/protection: Post-menopausal  Other Topics Concern   Not on file  Social History Narrative   Married. No kids- 2  step kids. 5 step grandkids.  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[employer])      Hobbies: beach, read, crochet, knit, watch tv   Social Determinants of Health   Financial Resource Strain: Low Risk  (11/26/2022)   Overall Financial Resource Strain (CARDIA)    Difficulty of Paying Living Expenses: Not hard at all  Food Insecurity: No Food Insecurity (11/26/2022)   Hunger Vital Sign    Worried About Programme researcher, broadcasting/film/video in  the Last Year: Never true    Ran Out of Food in the Last Year: Never true  Transportation Needs: No Transportation Needs (11/26/2022)   PRAPARE - Administrator, Civil Service (Medical): No    Lack of Transportation (Non-Medical): No  Physical Activity: Sufficiently Active (11/26/2022)   Exercise Vital Sign    Days of Exercise per Week: 6 days    Minutes of Exercise per Session: 30 min  Stress: No Stress Concern Present (11/26/2022)   [employer] of Occupational Health - Occupational Stress Questionnaire    Feeling of Stress : Not at all  Social Connections: Moderately Integrated (11/26/2022)   Social Connection and Isolation Panel [NHANES]    Frequency of Communication with Friends and Family: Twice a week    Frequency of Social Gatherings with Friends and Family: More than three times a week    Attends Religious Services: More than 4 times per year    Active Member of [employer] or Organizations: No    Attends Engineer, structural: Never    Marital Status: Married    Tobacco Counseling Counseling given: Not Answered   Clinical Intake:  Pre-visit preparation completed: Yes  Pain : No/denies pain     BMI - recorded: 28.34 Nutritional Status: BMI 25 -29 Overweight Nutritional Risks: None Diabetes: No  How often do you need to have someone help you when you read instructions, pamphlets, or other written materials from your doctor or pharmacy?: 1 - Never  Interpreter Needed?: No  Information  entered by :: Lanier Ensign, LPN   Activities of Daily Living    11/26/2022    9:09 AM  In your present state of health, do you have any difficulty performing the following activities:  Hearing? 0  Vision? 0  Difficulty concentrating or making decisions? 0  Walking or climbing stairs? 0  Dressing or bathing? 0  Doing errands, shopping? 0  Preparing Food and eating ? N  Using the Toilet? N  In the past six months, have you accidently leaked urine? N  Do you have problems with loss of bowel control? N  Managing your Medications? N  Managing your Finances? N  Housekeeping or managing your Housekeeping? N    Patient Care Team: Shelva Majestic, MD as PCP - General (Family Medicine) Lyn Records, MD (Inactive) as PCP - Cardiology (Cardiology) Emelia Loron, MD as Consulting Physician (General Surgery) Serena Croissant, MD as Consulting Physician (Hematology and Oncology) Dorothy Puffer, MD as Consulting Physician (Radiation Oncology) Antony Contras, MD as Consulting Physician (Ophthalmology) Sidney Ace, MD as Referring Physician (Allergy) Glyn Ade, PA-C as Physician Assistant (Dermatology)  Indicate any recent Medical Services you may have received from other than [facility] providers in the past year (date may be approximate).     Assessment:   This is a routine wellness examination for [place].  Hearing/Vision screen Hearing Screening - Comments:: Pt denies any hearing issues  Vision Screening - Comments:: Pt follows up with Dr Randon Goldsmith for annual eye exams   Dietary issues and exercise activities discussed:     Goals Addressed               This Visit's Progress     Patient Stated (pt-stated)        Stay healthy        Depression Screen    11/26/2022    9:06 AM 11/06/2022   11:47 AM 08/14/2022   11:03 AM 07/14/2022    9:45 AM  11/18/2021   10:32 AM 07/08/2021    9:19 AM 12/31/2020   11:14 AM  PHQ 2/9 Scores  PHQ - 2 Score 0 0 0 0 0 0 0  PHQ- 9 Score 0  0 0 0  0 0    Fall Risk    11/26/2022    9:09 AM 11/06/2022   11:47 AM 08/14/2022   11:03 AM 07/14/2022    9:44 AM 04/16/2022   12:02 PM  Fall Risk   Falls in the past year? 0 0 0 0 0  Number falls in past yr: 0 0 0 0 0  Injury with Fall? 0 0 0 0 0  Risk for fall due to : No Fall Risks No Fall Risks No Fall Risks No Fall Risks No Fall Risks  Follow up Falls prevention discussed Falls evaluation completed  Falls evaluation completed Falls evaluation completed    MEDICARE RISK AT HOME:  Medicare Risk at Home - 11/26/22 0909     Any stairs in or around the home? Yes    If so, are there any without handrails? No    Home free of loose throw rugs in walkways, pet beds, electrical cords, etc? Yes    Adequate lighting in your home to reduce risk of falls? Yes    Life alert? No    Use of a cane, walker or w/c? No    Grab bars in the bathroom? No    Shower chair or bench in shower? Yes    Elevated toilet seat or a handicapped toilet? No             TIMED UP AND GO:  Was the test performed?  No    Cognitive Function:        11/26/2022    9:10 AM 11/18/2021   10:35 AM 09/02/2020   12:13 PM 03/16/2019    3:12 PM  6CIT Screen  What Year? 0 points 0 points 0 points 0 points  What month? 0 points 0 points 0 points 0 points  What time? 0 points 0 points  0 points  Count back from 20 0 points 0 points 0 points 0 points  Months in reverse 0 points 0 points 0 points 0 points  Repeat phrase 0 points 2 points 0 points 0 points  Total Score 0 points 2 points  0 points    Immunizations Immunization History  Administered Date(s) Administered   Fluad Quad(high Dose 65+) 04/07/2019, 05/07/2020, 04/01/2022   Hepatitis A 10/22/1998   Influenza Whole 03/02/2011   Influenza, High Dose Seasonal PF 05/04/2016, 05/05/2019, 04/15/2021   PFIZER(Purple Top)SARS-COV-2 Vaccination 06/22/2019, 07/13/2019, 03/07/2020, 05/03/2020   Pneumococcal Conjugate-13 01/19/2007, 04/19/2014, 06/06/2015    Pneumococcal Polysaccharide-23 01/19/2007, 10/08/2014, 11/04/2015, 05/03/2017, 05/10/2018, 05/05/2019, 05/03/2020   Rsv, Bivalent, Protein Subunit Rsvpref,pf Verdis Frederickson) 07/25/2022   Td 07/02/2002, 09/23/2017   Tdap 08/22/2012   Zoster Recombinat (Shingrix) 12/28/2018, 06/08/2019   Zoster, Live 02/10/2010    TDAP status: Up to date  Flu Vaccine status: Up to date  Pneumococcal vaccine status: Up to date  Covid-19 vaccine status: Completed vaccines  Qualifies for Shingles Vaccine? Yes   Zostavax completed Yes   Shingrix Completed?: Yes  Screening Tests Health Maintenance  Topic Date Due   COVID-19 Vaccine (5 - 2023-24 season) 01/30/2022   MAMMOGRAM  12/16/2022   INFLUENZA VACCINE  12/31/2022   Medicare Annual Wellness (AWV)  11/26/2023   DTaP/Tdap/Td (4 - Td or Tdap) 09/24/2027   Pneumonia Vaccine 73+ Years old  Completed   DEXA SCAN  Completed   Zoster Vaccines- Shingrix  Completed   HPV VACCINES  Aged Out   Hepatitis C Screening  Discontinued    Health Maintenance  Health Maintenance Due  Topic Date Due   COVID-19 Vaccine (5 - 2023-24 season) 01/30/2022    Colorectal cancer screening: No longer required.   Mammogram status: Completed 12/15/21. Repeat every year  Bone Density status: Completed 07/15/22. Results reflect: Bone density results: OSTEOPOROSIS. Repeat every 2 years.   Additional Screening:  Hepatitis C Screening: Completed 04/02/20  Vision Screening: Recommended annual ophthalmology exams for early detection of glaucoma and other disorders of the eye. Is the patient up to date with their annual eye exam?  Yes  Who is the provider or what is the name of the office in which the patient attends annual eye exams? Dr Randon Goldsmith  If pt is not established with a provider, would they like to be referred to a provider to establish care? No .   Dental Screening: Recommended annual dental exams for proper oral hygiene   Community Resource Referral / Chronic Care  Management: CRR required this visit?  No   CCM required this visit?  No     Plan:     I have personally reviewed and noted the following in the patient's chart:   Medical and social history Use of alcohol, tobacco or illicit drugs  Current medications and supplements including opioid prescriptions. Patient is not currently taking opioid prescriptions. Functional ability and status Nutritional status Physical activity Advanced directives List of other physicians Hospitalizations, surgeries, and ER visits in previous 12 months Vitals Screenings to include cognitive, depression, and falls Referrals and appointments  In addition, I have reviewed and discussed with patient certain preventive protocols, quality metrics, and best practice recommendations. A written personalized care plan for preventive services as well as general preventive health recommendations were provided to patient.     Marzella Schlein, LPN   1/61/0960   After Visit Summary: (MyChart) Due to this being a telephonic visit, the after visit summary with patients personalized plan was offered to patient via MyChart   Nurse Notes: none

## 2022-11-26 NOTE — Patient Instructions (Signed)
[name] , Thank you for taking time to come for your Medicare Wellness Visit. I appreciate your ongoing commitment to your health goals. Please review the following plan we discussed and let me know if I can assist you in the future.   These are the goals we discussed:  Goals       Exercise 150 minutes per week (moderate activity)      Stay in your garden;  Try your treadmill at home  Will go to [place] park; x 3 per week; in addition to garden work       Patient Stated      Keep weight off       Patient Stated      None at this time       Patient Stated (pt-stated)      Stay healthy         This is a list of the screening recommended for you and due dates:  Health Maintenance  Topic Date Due   COVID-19 Vaccine (5 - 2023-24 season) 01/30/2022   Mammogram  12/16/2022   Flu Shot  12/31/2022   Medicare Annual Wellness Visit  11/26/2023   DTaP/Tdap/Td vaccine (4 - Td or Tdap) 09/24/2027   Pneumonia Vaccine  Completed   DEXA scan (bone density measurement)  Completed   Zoster (Shingles) Vaccine  Completed   HPV Vaccine  Aged Out   Hepatitis C Screening  Discontinued    Advanced directives: Please bring a copy of your health care power of attorney and living will to the office at your convenience.  Conditions/risks identified: stay healthy   Next appointment: Follow up in one year for your annual wellness visit    Preventive Care 65 Years and Older, Female Preventive care refers to lifestyle choices and visits with your health care provider that can promote health and wellness. What does preventive care include? A yearly physical exam. This is also called an annual well check. Dental exams once or twice a year. Routine eye exams. Ask your health care provider how often you should have your eyes checked. Personal lifestyle choices, including: Daily care of your teeth and gums. Regular physical activity. Eating a healthy diet. Avoiding tobacco and drug use. Limiting  alcohol use. Practicing safe sex. Taking low-dose aspirin every day. Taking vitamin and mineral supplements as recommended by your health care provider. What happens during an annual well check? The services and screenings done by your health care provider during your annual well check will depend on your age, overall health, lifestyle risk factors, and family history of disease. Counseling  Your health care provider may ask you questions about your: Alcohol use. Tobacco use. Drug use. Emotional well-being. Home and relationship well-being. Sexual activity. Eating habits. History of falls. Memory and ability to understand (cognition). Work and work Astronomer. Reproductive health. Screening  You may have the following tests or measurements: Height, weight, and BMI. Blood pressure. Lipid and cholesterol levels. These may be checked every 5 years, or more frequently if you are over 45 years old. Skin check. Lung cancer screening. You may have this screening every year starting at age 60 if you have a 30-pack-year history of smoking and currently smoke or have quit within the past 15 years. Fecal occult blood test (FOBT) of the stool. You may have this test every year starting at age 41. Flexible sigmoidoscopy or colonoscopy. You may have a sigmoidoscopy every 5 years or a colonoscopy every 10 years starting at age 38.  Hepatitis C blood test. Hepatitis B blood test. Sexually transmitted disease (STD) testing. Diabetes screening. This is done by checking your blood sugar (glucose) after you have not eaten for a while (fasting). You may have this done every 1-3 years. Bone density scan. This is done to screen for osteoporosis. You may have this done starting at age 60. Mammogram. This may be done every 1-2 years. Talk to your health care provider about how often you should have regular mammograms. Talk with your health care provider about your test results, treatment options, and if  necessary, the need for more tests. Vaccines  Your health care provider may recommend certain vaccines, such as: Influenza vaccine. This is recommended every year. Tetanus, diphtheria, and acellular pertussis (Tdap, Td) vaccine. You may need a Td booster every 10 years. Zoster vaccine. You may need this after age 83. Pneumococcal 13-valent conjugate (PCV13) vaccine. One dose is recommended after age 13. Pneumococcal polysaccharide (PPSV23) vaccine. One dose is recommended after age 65. Talk to your health care provider about which screenings and vaccines you need and how often you need them. This information is not intended to replace advice given to you by your health care provider. Make sure you discuss any questions you have with your health care provider. Document Released: 06/14/2015 Document Revised: 02/05/2016 Document Reviewed: 03/19/2015 Elsevier Interactive Patient Education  2017 [employer].  Fall Prevention in the Home Falls can cause injuries. They can happen to people of all ages. There are many things you can do to make your home safe and to help prevent falls. What can I do on the outside of my home? Regularly fix the edges of walkways and driveways and fix any cracks. Remove anything that might make you trip as you walk through a door, such as a raised step or threshold. Trim any bushes or trees on the path to your home. Use bright outdoor lighting. Clear any walking paths of anything that might make someone trip, such as rocks or tools. Regularly check to see if handrails are loose or broken. Make sure that both sides of any steps have handrails. Any raised decks and porches should have guardrails on the edges. Have any leaves, snow, or ice cleared regularly. Use sand or salt on walking paths during winter. Clean up any spills in your garage right away. This includes oil or grease spills. What can I do in the bathroom? Use night lights. Install grab bars by the toilet  and in the tub and shower. Do not use towel bars as grab bars. Use non-skid mats or decals in the tub or shower. If you need to sit down in the shower, use a plastic, non-slip stool. Keep the floor dry. Clean up any water that spills on the floor as soon as it happens. Remove soap buildup in the tub or shower regularly. Attach bath mats securely with double-sided non-slip rug tape. Do not have throw rugs and other things on the floor that can make you trip. What can I do in the bedroom? Use night lights. Make sure that you have a light by your bed that is easy to reach. Do not use any sheets or blankets that are too big for your bed. They should not hang down onto the floor. Have a firm chair that has side arms. You can use this for support while you get dressed. Do not have throw rugs and other things on the floor that can make you trip. What can I do in  the kitchen? Clean up any spills right away. Avoid walking on wet floors. Keep items that you use a lot in easy-to-reach places. If you need to reach something above you, use a strong step stool that has a grab bar. Keep electrical cords out of the way. Do not use floor polish or wax that makes floors slippery. If you must use wax, use non-skid floor wax. Do not have throw rugs and other things on the floor that can make you trip. What can I do with my stairs? Do not leave any items on the stairs. Make sure that there are handrails on both sides of the stairs and use them. Fix handrails that are broken or loose. Make sure that handrails are as long as the stairways. Check any carpeting to make sure that it is firmly attached to the stairs. Fix any carpet that is loose or worn. Avoid having throw rugs at the top or bottom of the stairs. If you do have throw rugs, attach them to the floor with carpet tape. Make sure that you have a light switch at the top of the stairs and the bottom of the stairs. If you do not have them, ask someone to add  them for you. What else can I do to help prevent falls? Wear shoes that: Do not have high heels. Have rubber bottoms. Are comfortable and fit you well. Are closed at the toe. Do not wear sandals. If you use a stepladder: Make sure that it is fully opened. Do not climb a closed stepladder. Make sure that both sides of the stepladder are locked into place. Ask someone to hold it for you, if possible. Clearly mark and make sure that you can see: Any grab bars or handrails. First and last steps. Where the edge of each step is. Use tools that help you move around (mobility aids) if they are needed. These include: Canes. Walkers. Scooters. Crutches. Turn on the lights when you go into a dark area. Replace any light bulbs as soon as they burn out. Set up your furniture so you have a clear path. Avoid moving your furniture around. If any of your floors are uneven, fix them. If there are any pets around you, be aware of where they are. Review your medicines with your doctor. Some medicines can make you feel dizzy. This can increase your chance of falling. Ask your doctor what other things that you can do to help prevent falls. This information is not intended to replace advice given to you by your health care provider. Make sure you discuss any questions you have with your health care provider. Document Released: 03/14/2009 Document Revised: 10/24/2015 Document Reviewed: 06/22/2014 Elsevier Interactive Patient Education  2017 [employer].

## 2022-12-08 ENCOUNTER — Other Ambulatory Visit: Payer: Self-pay | Admitting: Family Medicine

## 2022-12-08 ENCOUNTER — Ambulatory Visit
Admission: RE | Admit: 2022-12-08 | Discharge: 2022-12-08 | Disposition: A | Discharge status: Home / Self Care | Payer: Medicare HMO | Source: Ambulatory Visit | Attending: Family Medicine | Admitting: Family Medicine

## 2022-12-08 DIAGNOSIS — M5386 Other specified dorsopathies, lumbar region: Secondary | ICD-10-CM | POA: Diagnosis not present

## 2022-12-08 DIAGNOSIS — M545 Low back pain, unspecified: Secondary | ICD-10-CM

## 2022-12-08 MED ORDER — ACETAMINOPHEN 10 MG/ML IV SOLN
1000.0000 mg | Freq: Once | INTRAVENOUS | Status: AC
  Administered 2022-12-08: 1000 mg via INTRAVENOUS

## 2022-12-08 MED ORDER — METHYLPREDNISOLONE ACETATE 40 MG/ML INJ SUSP (RADIOLOG
120.0000 mg | Freq: Once | INTRAMUSCULAR | Status: AC
  Administered 2022-12-08: 120 mg via INTRALESIONAL

## 2022-12-08 MED ORDER — FENTANYL CITRATE PF 50 MCG/ML IJ SOSY
25.0000 ug | PREFILLED_SYRINGE | INTRAMUSCULAR | Status: DC | PRN
  Administered 2022-12-08 (×3): 25 ug via INTRAVENOUS

## 2022-12-08 MED ORDER — SODIUM CHLORIDE 0.9 % IV SOLN: INTRAVENOUS | Status: DC

## 2022-12-08 MED ORDER — MIDAZOLAM HCL 2 MG/2ML IJ SOLN
1.0000 mg | INTRAMUSCULAR | Status: DC | PRN
  Administered 2022-12-08: 0.5 mg via INTRAVENOUS
  Administered 2022-12-08: 1 mg via INTRAVENOUS
  Administered 2022-12-08: 0.5 mg via INTRAVENOUS

## 2022-12-08 NOTE — Discharge Instructions (Signed)
Radio Frequency Ablation Post Procedure Discharge Instructions ? ?May resume a regular diet and any medications that you routinely take (including pain medications). ?No driving day of procedure. ?Upon discharge go home and rest for at least 4 hours.  May use an ice pack as needed to injection sites on back. ?Remove bandades later, today. ? ? ? ?Please contact our office at [phone] for the following symptoms: ? ?Fever greater than 100 degrees ?Increased swelling, pain, or redness at injection site. ? ? ?Thank you for visiting [facility].  ?

## 2022-12-08 NOTE — Progress Notes (Signed)
Pt back in nursing recovery area. Pt still drowsy from procedure but will wake up when spoken to. Pt follows commands, talks in complete sentences and has no complaints at this time. Pt will remain in nurses station until discharged by Radiologist.   

## 2022-12-21 DIAGNOSIS — Z1231 Encounter for screening mammogram for malignant neoplasm of breast: Secondary | ICD-10-CM | POA: Diagnosis not present

## 2022-12-21 LAB — HM MAMMOGRAPHY

## 2022-12-22 ENCOUNTER — Ambulatory Visit (INDEPENDENT_AMBULATORY_CARE_PROVIDER_SITE_OTHER): Payer: Medicare HMO | Admitting: Family Medicine

## 2022-12-22 ENCOUNTER — Encounter: Payer: Self-pay | Admitting: Family Medicine

## 2022-12-22 VITALS — BP 112/70 | HR 53 | Temp 97.3°F | Ht 61.0 in | Wt 148.8 lb

## 2022-12-22 DIAGNOSIS — Z23 Encounter for immunization: Secondary | ICD-10-CM

## 2022-12-22 DIAGNOSIS — N1832 Chronic kidney disease, stage 3b: Secondary | ICD-10-CM | POA: Diagnosis not present

## 2022-12-22 DIAGNOSIS — I700 Atherosclerosis of aorta: Secondary | ICD-10-CM

## 2022-12-22 DIAGNOSIS — M8080XS Other osteoporosis with current pathological fracture, unspecified site, sequela: Secondary | ICD-10-CM

## 2022-12-22 DIAGNOSIS — I10 Essential (primary) hypertension: Secondary | ICD-10-CM

## 2022-12-22 DIAGNOSIS — S81812A Laceration without foreign body, left lower leg, initial encounter: Secondary | ICD-10-CM

## 2022-12-22 DIAGNOSIS — E785 Hyperlipidemia, unspecified: Secondary | ICD-10-CM | POA: Diagnosis not present

## 2022-12-22 LAB — COMPREHENSIVE METABOLIC PANEL
ALT: 13 U/L (ref 0–35)
AST: 15 U/L (ref 0–37)
Albumin: 4 g/dL (ref 3.5–5.2)
Alkaline Phosphatase: 56 U/L (ref 39–117)
BUN: 22 mg/dL (ref 6–23)
CO2: 27 mEq/L (ref 19–32)
Calcium: 9.3 mg/dL (ref 8.4–10.5)
Chloride: 101 mEq/L (ref 96–112)
Creatinine, Ser: 1.23 mg/dL — ABNORMAL HIGH (ref 0.40–1.20)
GFR: 40.94 mL/min — ABNORMAL LOW (ref >60.00)
Glucose, Bld: 74 mg/dL (ref 70–99)
Potassium: 4.1 mEq/L (ref 3.5–5.1)
Sodium: 134 mEq/L — ABNORMAL LOW (ref 135–145)
Total Bilirubin: 0.6 mg/dL (ref 0.2–1.2)
Total Protein: 6.7 g/dL (ref 6.0–8.3)

## 2022-12-22 LAB — CBC WITH DIFFERENTIAL/PLATELET
Basophils Absolute: 0.1 10*3/uL (ref 0.0–0.1)
Basophils Relative: 0.9 % (ref 0.0–3.0)
Eosinophils Absolute: 0.4 10*3/uL (ref 0.0–0.7)
Eosinophils Relative: 3.5 % (ref 0.0–5.0)
HCT: 37.6 % (ref 36.0–46.0)
Hemoglobin: 12.5 g/dL (ref 12.0–15.0)
Lymphocytes Relative: 14.8 % (ref 12.0–46.0)
Lymphs Abs: 1.7 10*3/uL (ref 0.7–4.0)
MCHC: 33.2 g/dL (ref 30.0–36.0)
MCV: 93.5 fl (ref 78.0–100.0)
Monocytes Absolute: 1.1 10*3/uL — ABNORMAL HIGH (ref 0.1–1.0)
Monocytes Relative: 9.5 % (ref 3.0–12.0)
Neutro Abs: 8.2 10*3/uL — ABNORMAL HIGH (ref 1.4–7.7)
Neutrophils Relative %: 71.3 % (ref 43.0–77.0)
Platelets: 365 10*3/uL (ref 150.0–400.0)
RBC: 4.02 Mil/uL (ref 3.87–5.11)
RDW: 14.2 % (ref 11.5–15.5)
WBC: 11.4 10*3/uL — ABNORMAL HIGH (ref 4.0–10.5)

## 2022-12-22 NOTE — Progress Notes (Signed)
Phone [phone] In person visit   Subjective:   Lisa Greene is a 82 y.o. year old very pleasant female patient who presents for/with See problem oriented charting Chief Complaint  Patient presents with   UTI f/u    Pt states she is better.   mammogram    Pt had done yesterday.   Hypertension    Pt has bp f/u next week and would like for it to be done at today's visit.    Past Medical History-  Patient Active Problem List   Diagnosis Date Noted   PVC (premature ventricular contraction) 04/14/2017    Priority: High   History of breast cancer- Breast cancer of lower-inner quadrant of left female breast 11/01/2015    Priority: High   Low vitamin B12 level 06/24/2016    Priority: Medium    Stage 3b chronic kidney disease (HCC) 10/22/2015    Priority: Medium    Asthma, moderate persistent, well-controlled 04/19/2014    Priority: Medium    Hypertension 11/25/2010    Priority: Medium    Hyperlipidemia 12/20/2006    Priority: Medium    Osteoporosis 12/20/2006    Priority: Medium    HSV-1 (herpes simplex virus 1) infection 04/28/2022    Priority: Low   Vasomotor rhinitis 05/10/2019    Priority: Low   Chronic allergic conjunctivitis 05/10/2019    Priority: Low   Aortic atherosclerosis (HCC) 11/23/2016    Priority: Low   Family history of abdominal aortic aneurysm 03/08/2015    Priority: Low   Eosinophilia 02/08/2009    Priority: Low   Allergic rhinitis 01/31/2008    Priority: Low   Lumbar facet arthropathy 08/11/2022   Closed compression fracture of L2 lumbar vertebra, with routine healing, subsequent encounter 08/11/2022   Pain in left ankle and joints of left foot 06/18/2021    Medications- reviewed and updated Current Outpatient Medications  Medication Sig Dispense Refill   Albuterol Sulfate (PROAIR RESPICLICK) 108 (90 Base) MCG/ACT AEPB Inhale 1 puff into the lungs every 8 (eight) hours as needed (wheezing/shortness of breath).     atorvastatin (LIPITOR) 20  MG tablet Take 1 tablet (20 mg total) by mouth daily. 90 tablet 3   azelastine (OPTIVAR) 0.05 % ophthalmic solution      BREO ELLIPTA 200-25 MCG/INH AEPB Inhale 1 puff into the lungs daily.     calcium citrate (CALCITRATE - DOSED IN MG ELEMENTAL CALCIUM) 950 (200 Ca) MG tablet Take 200 mg of elemental calcium by mouth daily.     cetirizine (ZYRTEC) 10 MG tablet Take 10 mg by mouth daily.     cholecalciferol (VITAMIN D) 1000 UNITS tablet Take 2,000 Units by mouth daily.     Cyanocobalamin (VITAMIN B 12 PO) Take 1,000 mg by mouth every other day.     fluticasone (FLONASE) 50 MCG/ACT nasal spray Place 1 spray into both nostrils daily as needed.      hydrochlorothiazide (MICROZIDE) 12.5 MG capsule Take 1 capsule (12.5 mg total) by mouth every Monday, Wednesday, and Friday. 45 capsule 2   irbesartan (AVAPRO) 150 MG tablet TAKE ONE TABLET BY MOUTH DAILY 90 tablet 3   metoprolol succinate (TOPROL-XL) 50 MG 24 hr tablet Take 1 tablet (50 mg total) by mouth daily. 90 tablet 3   montelukast (SINGULAIR) 10 MG tablet Take 1 tablet by mouth at bedtime.     PROLIA 60 MG/ML SOSY injection Inject into the skin every 6 (six) months.     No current facility-administered medications for this  visit.     Objective:  BP 112/70   Pulse (!) 53   Temp (!) 97.3 F (36.3 C)   Ht 5\' 1"  (1.549 m)   Wt 148 lb 12.8 oz (67.5 kg)   SpO2 100%   BMI 28.12 kg/m  Gen: NAD, resting comfortably CV: RRR no murmurs rubs or gallops Lungs: CTAB no crackles, wheeze, rhonchi Ext: no edema, laceration that is healing on left lower leg- occurred a week ago and overdue for tetanus Skin: warm, dry     Assessment and Plan   #laceration of left leg occurred while doing yard work and got hit with a bucket - last tetanus was 08/22/12 so needs update- wound appears to be healing  #Health maintenance- had mammogram yesterday and she had them send [place] a copy- we are pending this  # urinary tract infection follow up - reports full  resolution of symptoms  #hyperlipidemia #aortic atherosclerosis- LDL goal under 70 ideally S: Medication:atorvastatin 20 mg  Lab Results  Component Value Date   CHOL 148 07/14/2022   HDL 72.50 07/14/2022   LDLCALC 61 07/14/2022   LDLDIRECT 59.0 10/17/2019   TRIG 71.0 07/14/2022   CHOLHDL 2 07/14/2022  A/P: HLD-stable- continue current medicines as at goal Aortic atherosclerosis (presumed stable)- LDL goal ideally <70 - at goal- continue current medications   #hypertension with white coat element/PVCs on metoprolol S: medication: irbesartan 150mg , metoprolol 50 mg XR, hydrochlorothiazide 12.5 mg Monday Wednesday Friday -Edema on amlodipine and with HCTZ in the past had mild hyponatremia- this dose has worked Home readings #s:  home cuff previously verified. Usually more around 130s or low 140's at home- higher on days not on hydrochlorothiazide  BP Readings from Last 3 Encounters:  12/22/22 112/70  12/08/22 (!) 159/54  11/17/22 (!) 154/65  A/P:blood pressure high acceptable at home- looks good here today- we opted to continue current medications and follow up at physical   #Chronic kidney disease stage III S: GFR is typically in the 40s range -Patient knows to avoid NSAIDs  -hold irbesartan if gets dehydrated A/P: chronic kidney disease III stable- continue current medicines - just had checked in February- she preferred to wait until next visit to recheck   # Asthma/allergies- follows with Dr. Lyla Son: Maintenance Medication: Virgel Bouquet and singulair (astelin and flonase and zyrtec as needed as well) As needed medication: albuterol. Patient is using this rarely- almost never.  A/P: doing well- continue current medications  -very mild frontal headache that comes and goes over last week- hasn't taken anything. No particular stressors. Wonders about allergies- she is going to try her allergy medications short term to see if that helps. Has had headache(s) in past with allergies- if worsens  she will let me know- new headaches over age 49 that do not resolve we discussed typically need neuroimaging with MRI   #Osteoporosis S: Medication: Prolia started 08/24/22  Last DEXA: Worsening bone density on 07/09/2020 and we discussed referral to [facility] due to renal function being near border for several medications- have seen prolia used in similar situations by them- wants to wait until 2024 to repeat then reconsider Prolia- we started with lack of improvement and fracture in 2024  -did not tolerate aledronate when tried- hot flashes and didn't feel well  -compression fracture in lumbar spine with kyphoplasty 05/28/22  Calcium: 1200mg  (through diet ok) recommended - 1200 through pill  Vitamin D: 1000 units a day recommended- she takes this Last vitamin D A/P: osteoporosis  hopefuly stable or improved on Prolia- will be due for injectoin around september   # B12 deficiency S: Current treatment/medication (oral vs. IM):  b12 every other day now A/P: looked good last visit- continue current medications    Recommended follow up: Return in about 7 months (around 07/25/2023) for physical or sooner if needed.Schedule b4 you leave. Future Appointments  Date Time Provider Department Center  01/12/2023 10:00 AM Shelva Majestic, MD [facility]  01/19/2023  2:00 PM Judi Saa, DO [facility] None  12/07/2023  9:15 AM [facility] ANNUAL WELLNESS VISIT 1 [facility]    Lab/Order associations:   ICD-10-CM   1. Primary hypertension  I10 Comprehensive metabolic panel    CBC with Differential/Platelet    2. Hyperlipidemia, unspecified hyperlipidemia type  E78.5 Comprehensive metabolic panel    CBC with Differential/Platelet    3. Localized osteoporosis with current pathological fracture, sequela  M80.80XS     4. Stage 3b chronic kidney disease (HCC)  N18.32     5. Aortic atherosclerosis (HCC)  I70.0     6. Laceration of left lower extremity, initial encounter  S81.812A      No  orders of the defined types were placed in this encounter.  Return precautions advised.  Tana Conch, MD

## 2022-12-22 NOTE — Patient Instructions (Addendum)
Health Maintenance Due  Topic Date Due   MAMMOGRAM  12/16/2022  Thanks for having them send [place] this  If September 18th comes around and you have not heard about next Prolia please reach out to [place]  If headache worsens or persists with allergy medicines let us know in the next week  Please stop by lab before you go If you have mychart- we will send your results within 3 business days of [place] receiving them.  If you do not have mychart- we will call you about results within 5 business days of [place] receiving them.  *please also note that you will see labs on mychart as soon as they post. I will later go in and write notes on them- will say "notes from Dr. Durene Cal"   Recommended follow up: Return in about 7 months (around 07/25/2023) for physical or sooner if needed.Schedule b4 you leave.  On your way out please cancel the August 13th visit

## 2022-12-23 ENCOUNTER — Other Ambulatory Visit: Payer: Self-pay

## 2022-12-23 DIAGNOSIS — D72829 Elevated white blood cell count, unspecified: Secondary | ICD-10-CM

## 2023-01-04 DIAGNOSIS — D0512 Intraductal carcinoma in situ of left breast: Secondary | ICD-10-CM | POA: Diagnosis not present

## 2023-01-12 ENCOUNTER — Ambulatory Visit: Payer: Medicare HMO | Admitting: Family Medicine

## 2023-01-12 ENCOUNTER — Telehealth: Payer: Self-pay | Admitting: Family Medicine

## 2023-01-12 NOTE — Telephone Encounter (Signed)
Patient states she wants Dr. Durene Cal to know she is not feeling better. Patient is scheduled for Labs 01/13/23.

## 2023-01-13 ENCOUNTER — Other Ambulatory Visit (INDEPENDENT_AMBULATORY_CARE_PROVIDER_SITE_OTHER): Payer: Medicare HMO

## 2023-01-13 DIAGNOSIS — D72829 Elevated white blood cell count, unspecified: Secondary | ICD-10-CM

## 2023-01-13 LAB — CBC WITH DIFFERENTIAL/PLATELET
Basophils Absolute: 0.1 10*3/uL (ref 0.0–0.1)
Basophils Relative: 1.2 % (ref 0.0–3.0)
Eosinophils Absolute: 0.2 10*3/uL (ref 0.0–0.7)
Eosinophils Relative: 4 % (ref 0.0–5.0)
HCT: 37.1 % (ref 36.0–46.0)
Hemoglobin: 12.3 g/dL (ref 12.0–15.0)
Lymphocytes Relative: 22.7 % (ref 12.0–46.0)
Lymphs Abs: 1.4 10*3/uL (ref 0.7–4.0)
MCHC: 33.3 g/dL (ref 30.0–36.0)
MCV: 94.7 fl (ref 78.0–100.0)
Monocytes Absolute: 0.6 10*3/uL (ref 0.1–1.0)
Monocytes Relative: 10.5 % (ref 3.0–12.0)
Neutro Abs: 3.8 10*3/uL (ref 1.4–7.7)
Neutrophils Relative %: 61.6 % (ref 43.0–77.0)
Platelets: 286 10*3/uL (ref 150.0–400.0)
RBC: 3.92 Mil/uL (ref 3.87–5.11)
RDW: 14 % (ref 11.5–15.5)
WBC: 6.1 10*3/uL (ref 4.0–10.5)

## 2023-01-13 NOTE — Progress Notes (Signed)
[facility] [address] [place] [address] Phone: [phone] Subjective:   INadine Counts, am serving as a scribe for Dr. Antoine Primas.  I'm seeing this patient by the request  of:  Shelva Majestic, MD  CC: Low back pain follow-up  [id number]  10/07/2022 Facet arthropathy noted. Patient did respond well to the facet injections but would like to see if patient would respond to medial branch block and see if she would be a candidate for the possibility of radiofrequency ablation. Will see how patient responds. Patient will follow-up with me again in 6 to 8 weeks otherwise.   Updated 01/19/2023 ELANY COVILLE is a 82 y.o. female coming in with complaint of back pain patient did respond initially to the facet injections and undergone a radiofrequency ablation in July of this year.  Patient states doing well since ablation. More soreness than pain when over doing things. On a regular day no pain.       Past Medical History:  Diagnosis Date   ALLERGIC RHINITIS 01/31/2008   Asthma    BCC (basal cell carcinoma)sup& nod 03/01/2017   right nostril   Breast cancer of lower-inner quadrant of left female breast (HCC) 11/01/2015   treated with lumpectomy and radiation   Cataract    Colon cancer screening 05/21/2014   No polyps at age 89. Diverticulosis alone-no more colonoscopies.     DIVERTICULITIS, HX OF 09/07/2008   pt denies this    Eosinophilia 02/08/2009   Fibroid    HYPERLIPIDEMIA 12/20/2006   Hypertension    OSTEOPOROSIS 12/20/2006   PVC's (premature ventricular contractions)    SCC (squamous cell carcinoma) well diff 11/28/2013   left side chin   Vitamin B 12 deficiency    Past Surgical History:  Procedure Laterality Date   BREAST LUMPECTOMY WITH NEEDLE LOCALIZATION Left 01/03/2016   Procedure: BREAST re-excision LUMPECTOMY WITH NEEDLE LOCALIZATION;  Surgeon: Emelia Loron, MD;  Location: [facility];  Service:  General;  Laterality: Left;  BREAST re-excision LUMPECTOMY WITH NEEDLE LOCALIZATION   BREAST LUMPECTOMY WITH RADIOACTIVE SEED LOCALIZATION Left 11/28/2015   Procedure: LEFT BREAST LUMPECTOMY WITH BRACKETED RADIOACTIVE SEED LOCALIZATION;  Surgeon: Emelia Loron, MD;  Location:  SURGERY CENTER;  Service: General;  Laterality: Left;   BREAST SURGERY     cysts removed x2   CATARACT EXTRACTION BILATERAL W/ ANTERIOR VITRECTOMY  2013   bilateral cataracts   COLONOSCOPY  2005   tics only    FOOT SURGERY     IR KYPHO LUMBAR INC FX REDUCE BONE BX UNI/BIL CANNULATION INC/IMAGING  05/28/2022   IR RADIOLOGIST EVAL & MGMT  05/21/2022   KNEE ARTHROSCOPY     left   NEUROMA SURGERY     x2 feet   thyroid duct cyst     Social History   Socioeconomic History   Marital status: Married    Spouse name: Not on file   Number of children: Not on file   Years of education: Not on file   Highest education level: Not on file  Occupational History   Occupation: retired  Tobacco Use   Smoking status: Never   Smokeless tobacco: Never  Vaping Use   Vaping status: Never Used  Substance and Sexual Activity   Alcohol use: No    Alcohol/week: 0.0 standard drinks of alcohol   Drug use: No   Sexual activity: Yes    Partners: Male    Birth control/protection: Post-menopausal  Other Topics Concern   Not on file  Social History Narrative   Married. No kids- 2 step kids. 5 step grandkids.  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[employer])      Hobbies: beach, read, crochet, knit, watch tv   Social Determinants of Health   Financial Resource Strain: Low Risk  (11/26/2022)   Overall Financial Resource Strain (CARDIA)    Difficulty of Paying Living Expenses: Not hard at all  Food Insecurity: No Food Insecurity (11/26/2022)   Hunger Vital Sign    Worried About Running Out of Food in the Last Year: Never true    Ran Out of Food in the Last Year: Never true   Transportation Needs: No Transportation Needs (11/26/2022)   PRAPARE - Administrator, Civil Service (Medical): No    Lack of Transportation (Non-Medical): No  Physical Activity: Sufficiently Active (11/26/2022)   Exercise Vital Sign    Days of Exercise per Week: 6 days    Minutes of Exercise per Session: 30 min  Stress: No Stress Concern Present (11/26/2022)   [employer] of Occupational Health - Occupational Stress Questionnaire    Feeling of Stress : Not at all  Social Connections: Moderately Integrated (11/26/2022)   Social Connection and Isolation Panel [NHANES]    Frequency of Communication with Friends and Family: Twice a week    Frequency of Social Gatherings with Friends and Family: More than three times a week    Attends Religious Services: More than 4 times per year    Active Member of [employer] or Organizations: No    Attends Engineer, structural: Never    Marital Status: Married   Allergies  Allergen Reactions   Lisinopril Other (See Comments) and Cough    Dry cough and stinging rash   Latex Rash   Penicillins Rash    Has patient had a PCN reaction causing immediate rash, facial/tongue/throat swelling, SOB or lightheadedness with hypotension: Yes Has patient had a PCN reaction causing severe rash involving mucus membranes or skin necrosis: No Has patient had a PCN reaction that required hospitalization No Has patient had a PCN reaction occurring within the last 10 years: No If all of the above answers are "NO", then may proceed with Cephalosporin use.    Sulfonamide Derivatives Rash   Tape Rash    Band-aids break out skin!!   Family History  Problem Relation Age of Onset   Brain cancer Mother    COPD Father    Heart disease Father        stent placed 54   Allergic rhinitis Father    COPD Brother        died of flu/double pna age 62   Healthy Brother        lives local- sees Dr. Durene Cal   Arthritis Brother        back issues   Colon  cancer Neg Hx    Stomach cancer Neg Hx     Current Outpatient Medications (Endocrine & Metabolic):    PROLIA 60 MG/ML SOSY injection, Inject into the skin every 6 (six) months.  Current Outpatient Medications (Cardiovascular):    atorvastatin (LIPITOR) 20 MG tablet, Take 1 tablet (20 mg total) by mouth daily.   hydrochlorothiazide (MICROZIDE) 12.5 MG capsule, Take 1 capsule (12.5 mg total) by mouth every Monday, Wednesday, and Friday.   irbesartan (AVAPRO) 150 MG tablet, TAKE ONE TABLET BY MOUTH DAILY  metoprolol succinate (TOPROL-XL) 50 MG 24 hr tablet, Take 1 tablet (50 mg total) by mouth daily.  Current Outpatient Medications (Respiratory):    Albuterol Sulfate (PROAIR RESPICLICK) 108 (90 Base) MCG/ACT AEPB, Inhale 1 puff into the lungs every 8 (eight) hours as needed (wheezing/shortness of breath).   BREO ELLIPTA 200-25 MCG/INH AEPB, Inhale 1 puff into the lungs daily.   cetirizine (ZYRTEC) 10 MG tablet, Take 10 mg by mouth daily.   fluticasone (FLONASE) 50 MCG/ACT nasal spray, Place 1 spray into both nostrils daily as needed.    montelukast (SINGULAIR) 10 MG tablet, Take 1 tablet by mouth at bedtime.   Current Outpatient Medications (Hematological):    Cyanocobalamin (VITAMIN B 12 PO), Take 1,000 mg by mouth every other day.  Current Outpatient Medications (Other):    tiZANidine (ZANAFLEX) 2 MG tablet, Take 1 tablet (2 mg total) by mouth at bedtime.   azelastine (OPTIVAR) 0.05 % ophthalmic solution,    calcium citrate (CALCITRATE - DOSED IN MG ELEMENTAL CALCIUM) 950 (200 Ca) MG tablet, Take 200 mg of elemental calcium by mouth daily.   cholecalciferol (VITAMIN D) 1000 UNITS tablet, Take 2,000 Units by mouth daily.   Reviewed prior external information including notes and imaging from  primary care provider As well as notes that were available from care everywhere and other healthcare systems.  Reviewed patient's follow-up with general surgery who has released her completely for  patient's ductal carcinoma.  Past medical history, social, surgical and family history all reviewed in electronic medical record.  No pertanent information unless stated regarding to the chief complaint.   Review of Systems:  No headache, visual changes, nausea, vomiting, diarrhea, constipation, dizziness, abdominal pain, skin rash, fevers, chills, night sweats, weight loss, swollen lymph nodes, body aches, joint swelling, chest pain, shortness of breath, mood changes. POSITIVE muscle aches and some spasms from time to time  Objective  Blood pressure 110/64, pulse 67, height 5\' 1"  (1.549 m), weight 153 lb (69.4 kg), SpO2 98%.   General: No apparent distress alert and oriented x3 mood and affect normal, dressed appropriately.  HEENT: Pupils equal, extraocular movements intact  Respiratory: Patient's speak in full sentences and does not appear short of breath  Cardiovascular: No lower extremity edema, non tender, no erythema  Low back pain is noted with some mild increase in extension.  Discussed which activities to do and which ones to avoid. No significant midline tenderness though noted today.    Impression and Recommendations:     The above documentation has been reviewed and is accurate and complete Judi Saa, DO

## 2023-01-19 ENCOUNTER — Ambulatory Visit: Payer: Medicare HMO | Admitting: Family Medicine

## 2023-01-19 ENCOUNTER — Encounter: Payer: Self-pay | Admitting: Family Medicine

## 2023-01-19 VITALS — BP 110/64 | HR 67 | Ht 61.0 in | Wt 153.0 lb

## 2023-01-19 DIAGNOSIS — M47816 Spondylosis without myelopathy or radiculopathy, lumbar region: Secondary | ICD-10-CM | POA: Diagnosis not present

## 2023-01-19 DIAGNOSIS — S32020D Wedge compression fracture of second lumbar vertebra, subsequent encounter for fracture with routine healing: Secondary | ICD-10-CM

## 2023-01-19 MED ORDER — TIZANIDINE HCL 2 MG PO TABS: 2.0000 mg | ORAL_TABLET | Freq: Every day | ORAL | 0 refills | Status: DC

## 2023-01-19 NOTE — Assessment & Plan Note (Addendum)
Patient does state that there is intermittent pain here again.  We discussed the potential of repeating x-rays.  States that it is only intermittent at the moment.  Will continue to monitor.  If worsening symptoms she would like to consider it.  Follow-up with me again in 6 to 8 weeks otherwise.  Due to the increasing discomfort then was given a very low dose of Zanaflex to see if this would be beneficial as well.  F

## 2023-01-19 NOTE — Patient Instructions (Addendum)
Good to see you! Try the coat trick if needed PT referral  Zanaflex 2mg  try 1/2 pill at night when needed See you again in 2-3 months

## 2023-01-22 ENCOUNTER — Ambulatory Visit: Payer: Medicare HMO | Admitting: Family Medicine

## 2023-01-22 ENCOUNTER — Encounter: Payer: Self-pay | Admitting: Family Medicine

## 2023-01-22 VITALS — BP 118/72 | HR 76 | Temp 97.6°F | Resp 16 | Ht 61.0 in | Wt 150.2 lb

## 2023-01-22 DIAGNOSIS — S81832A Puncture wound without foreign body, left lower leg, initial encounter: Secondary | ICD-10-CM

## 2023-01-22 NOTE — Patient Instructions (Signed)
It appears there was a small splinter or piece of organic material sitting at the top of the wound that I removed.  I suspect you had a small puncture wound or abrasion that should improve with a little bit of time.  I do want you to clean the area with soap and water twice per day, keep it covered with a clean bandage, change bandage if it does become soaked.  If any surrounding redness, pus, increasing pain or not improving into Monday, I do want you to have the area rechecked.  Take care.   Return to the clinic or go to the nearest emergency room if any of your symptoms worsen or new symptoms occur.

## 2023-01-22 NOTE — Progress Notes (Signed)
Subjective:  Patient ID: Lisa Greene, female    DOB: 1940-11-27  Age: 82 y.o. MRN: [id number]  CC:  Chief Complaint  Patient presents with   Leg Swelling    Leg swelling with fluids     HPI CHRYSTLE GALLUS presents for   Presents for acute visit, PCP is Dr. Durene Cal. History of CKD, PVCs, on HCTZ, Toprol, irbesartan for hypertension management.   Here today for issue of left leg.  Weed eating yesterday morning, felt something hit left lower leg, small amount bleeding. No FB seen. Left alone.  Noticed some clear fluid from area yesterday evening. No bleeding, min soreness. No fever. No pus.  Tx: shower with soap. No topical creams, etc.   Healing wound from one month prior above that area.     History Patient Active Problem List   Diagnosis Date Noted   Lumbar facet arthropathy 08/11/2022   Closed compression fracture of L2 lumbar vertebra, with routine healing, subsequent encounter 08/11/2022   HSV-1 (herpes simplex virus 1) infection 04/28/2022   Pain in left ankle and joints of left foot 06/18/2021   Vasomotor rhinitis 05/10/2019   Chronic allergic conjunctivitis 05/10/2019   PVC (premature ventricular contraction) 04/14/2017   Aortic atherosclerosis (HCC) 11/23/2016   Low vitamin B12 level 06/24/2016   History of breast cancer- Breast cancer of lower-inner quadrant of left female breast 11/01/2015   Stage 3b chronic kidney disease (HCC) 10/22/2015   Family history of abdominal aortic aneurysm 03/08/2015   Asthma, moderate persistent, well-controlled 04/19/2014   Hypertension 11/25/2010   Eosinophilia 02/08/2009   Allergic rhinitis 01/31/2008   Hyperlipidemia 12/20/2006   Osteoporosis 12/20/2006   Past Medical History:  Diagnosis Date   ALLERGIC RHINITIS 01/31/2008   Asthma    BCC (basal cell carcinoma)sup& nod 03/01/2017   right nostril   Breast cancer of lower-inner quadrant of left female breast (HCC) 11/01/2015   treated with lumpectomy and radiation    Cataract    Colon cancer screening 05/21/2014   No polyps at age 48. Diverticulosis alone-no more colonoscopies.     DIVERTICULITIS, HX OF 09/07/2008   pt denies this    Eosinophilia 02/08/2009   Fibroid    HYPERLIPIDEMIA 12/20/2006   Hypertension    OSTEOPOROSIS 12/20/2006   PVC's (premature ventricular contractions)    SCC (squamous cell carcinoma) well diff 11/28/2013   left side chin   Vitamin B 12 deficiency    Past Surgical History:  Procedure Laterality Date   BREAST LUMPECTOMY WITH NEEDLE LOCALIZATION Left 01/03/2016   Procedure: BREAST re-excision LUMPECTOMY WITH NEEDLE LOCALIZATION;  Surgeon: Emelia Loron, MD;  Location: [facility];  Service: General;  Laterality: Left;  BREAST re-excision LUMPECTOMY WITH NEEDLE LOCALIZATION   BREAST LUMPECTOMY WITH RADIOACTIVE SEED LOCALIZATION Left 11/28/2015   Procedure: LEFT BREAST LUMPECTOMY WITH BRACKETED RADIOACTIVE SEED LOCALIZATION;  Surgeon: Emelia Loron, MD;  Location: [facility];  Service: General;  Laterality: Left;   BREAST SURGERY     cysts removed x2   CATARACT EXTRACTION BILATERAL W/ ANTERIOR VITRECTOMY  2013   bilateral cataracts   COLONOSCOPY  2005   tics only    FOOT SURGERY     IR KYPHO LUMBAR INC FX REDUCE BONE BX UNI/BIL CANNULATION INC/IMAGING  05/28/2022   IR RADIOLOGIST EVAL & MGMT  05/21/2022   KNEE ARTHROSCOPY     left   NEUROMA SURGERY     x2 feet   thyroid duct cyst  Allergies  Allergen Reactions   Lisinopril Other (See Comments) and Cough    Dry cough and stinging rash   Latex Rash   Penicillins Rash    Has patient had a PCN reaction causing immediate rash, facial/tongue/throat swelling, SOB or lightheadedness with hypotension: Yes Has patient had a PCN reaction causing severe rash involving mucus membranes or skin necrosis: No Has patient had a PCN reaction that required hospitalization No Has patient had a PCN reaction occurring within the last 10 years: No If  all of the above answers are "NO", then may proceed with Cephalosporin use.    Sulfonamide Derivatives Rash   Tape Rash    Band-aids break out skin!!   Prior to Admission medications   Medication Sig Start Date End Date Taking? Authorizing Provider  Albuterol Sulfate (PROAIR RESPICLICK) 108 (90 Base) MCG/ACT AEPB Inhale 1 puff into the lungs every 8 (eight) hours as needed (wheezing/shortness of breath).    [provider]  atorvastatin (LIPITOR) 20 MG tablet Take 1 tablet (20 mg total) by mouth daily. 08/25/22   Christell Constant, MD  azelastine (OPTIVAR) 0.05 % ophthalmic solution  05/05/19   [provider]  BREO ELLIPTA 200-25 MCG/INH AEPB Inhale 1 puff into the lungs daily. 12/13/19   [provider]  calcium citrate (CALCITRATE - DOSED IN MG ELEMENTAL CALCIUM) 950 (200 Ca) MG tablet Take 200 mg of elemental calcium by mouth daily.    [provider]  cetirizine (ZYRTEC) 10 MG tablet Take 10 mg by mouth daily.    [provider]  cholecalciferol (VITAMIN D) 1000 UNITS tablet Take 2,000 Units by mouth daily.    [provider]  Cyanocobalamin (VITAMIN B 12 PO) Take 1,000 mg by mouth every other day.    [provider]  fluticasone (FLONASE) 50 MCG/ACT nasal spray Place 1 spray into both nostrils daily as needed.  10/08/14   [provider]  hydrochlorothiazide (MICROZIDE) 12.5 MG capsule Take 1 capsule (12.5 mg total) by mouth every Monday, Wednesday, and Friday. 08/26/22   Christell Constant, MD  irbesartan (AVAPRO) 150 MG tablet TAKE ONE TABLET BY MOUTH DAILY 10/06/22   Shelva Majestic, MD  metoprolol succinate (TOPROL-XL) 50 MG 24 hr tablet Take 1 tablet (50 mg total) by mouth daily. 08/25/22   Chandrasekhar, Mahesh A, MD  montelukast (SINGULAIR) 10 MG tablet Take 1 tablet by mouth at bedtime. 08/30/15   [provider]  PROLIA 60 MG/ML SOSY injection Inject into the skin every 6 (six) months. 08/24/22    [provider]  tiZANidine (ZANAFLEX) 2 MG tablet Take 1 tablet (2 mg total) by mouth at bedtime. 01/19/23   Judi Saa, DO   Social History   Socioeconomic History   Marital status: Married    Spouse name: Not on file   Number of children: Not on file   Years of education: Not on file   Highest education level: Not on file  Occupational History   Occupation: retired  Tobacco Use   Smoking status: Never   Smokeless tobacco: Never  Vaping Use   Vaping status: Never Used  Substance and Sexual Activity   Alcohol use: No    Alcohol/week: 0.0 standard drinks of alcohol   Drug use: No   Sexual activity: Yes    Partners: Male    Birth control/protection: Post-menopausal  Other Topics Concern   Not on file  Social History Narrative   Married. No kids- 2 step kids.  5 step grandkids.  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[employer])      Hobbies: beach, read, crochet, knit, watch tv   Social Determinants of Health   Financial Resource Strain: Low Risk  (11/26/2022)   Overall Financial Resource Strain (CARDIA)    Difficulty of Paying Living Expenses: Not hard at all  Food Insecurity: No Food Insecurity (11/26/2022)   Hunger Vital Sign    Worried About Running Out of Food in the Last Year: Never true    Ran Out of Food in the Last Year: Never true  Transportation Needs: No Transportation Needs (11/26/2022)   PRAPARE - Administrator, Civil Service (Medical): No    Lack of Transportation (Non-Medical): No  Physical Activity: Sufficiently Active (11/26/2022)   Exercise Vital Sign    Days of Exercise per Week: 6 days    Minutes of Exercise per Session: 30 min  Stress: No Stress Concern Present (11/26/2022)   [employer] of Occupational Health - Occupational Stress Questionnaire    Feeling of Stress : Not at all  Social Connections: Moderately Integrated (11/26/2022)   Social Connection and Isolation Panel [NHANES]     Frequency of Communication with Friends and Family: Twice a week    Frequency of Social Gatherings with Friends and Family: More than three times a week    Attends Religious Services: More than 4 times per year    Active Member of [employer] or Organizations: No    Attends Banker Meetings: Never    Marital Status: Married  Catering manager Violence: Not At Risk (11/26/2022)   Humiliation, Afraid, Rape, and Kick questionnaire    Fear of Current or Ex-Partner: No    Emotionally Abused: No    Physically Abused: No    Sexually Abused: No    Review of Systems Per HPi  Objective:   Vitals:   01/22/23 1126  BP: 118/72  Pulse: 76  Resp: 16  Temp: 97.6 F (36.4 C)  TempSrc: Oral  SpO2: 96%  Weight: 150 lb 3.2 oz (68.1 kg)  Height: 5\' 1"  (1.549 m)     Physical Exam Vitals reviewed.  Constitutional:      General: She is not in acute distress.    Appearance: Normal appearance. She is well-developed.  HENT:     Head: Normocephalic and atraumatic.  Cardiovascular:     Rate and Rhythm: Normal rate.  Pulmonary:     Effort: Pulmonary effort is normal.  Skin:    Comments: Left lower leg, see photos, small approximately 1 mm splinter/organic appearing material sitting at top of wound, lifted off.  Small amount of clear discharge from small puncture wound, again approximately 1 to 2 mm.  No surrounding erythema, no bleeding, no exudate.  No appreciable lower extremity swelling.  Healing wound superior medially.  Neurological:     Mental Status: She is alert and oriented to person, place, and time.  Psychiatric:        Mood and Affect: Mood normal.         Assessment & Plan:  LASHAN NELLUMS is a 82 y.o. female . Puncture wound of left lower leg, initial encounter Appears to be a small puncture wound with organic appearing foreign body noted at surface, easily removed.  No signs of surrounding infection at this time or deep space infection/abscess.  Wound care  discussed with soap and water cleansing twice daily, topical Polysporin  if needed, keep clean and covered.  ER/urgent care precautions discussed over the weekend and in person evaluation if not improving into next week.  No orders of the defined types were placed in this encounter.  Patient Instructions  It appears there was a small splinter or piece of organic material sitting at the top of the wound that I removed.  I suspect you had a small puncture wound or abrasion that should improve with a little bit of time.  I do want you to clean the area with soap and water twice per day, keep it covered with a clean bandage, change bandage if it does become soaked.  If any surrounding redness, pus, increasing pain or not improving into Monday, I do want you to have the area rechecked.  Take care.   Return to the clinic or go to the nearest emergency room if any of your symptoms worsen or new symptoms occur.     Signed,   Meredith Staggers, MD [facility] 01/22/23 10:23 PM

## 2023-02-08 ENCOUNTER — Other Ambulatory Visit: Payer: Self-pay | Admitting: Family Medicine

## 2023-02-09 ENCOUNTER — Encounter: Payer: Self-pay | Admitting: Physical Therapy

## 2023-02-09 ENCOUNTER — Ambulatory Visit: Payer: Medicare HMO | Admitting: Physical Therapy

## 2023-02-09 DIAGNOSIS — M546 Pain in thoracic spine: Secondary | ICD-10-CM | POA: Diagnosis not present

## 2023-02-09 DIAGNOSIS — M5459 Other low back pain: Secondary | ICD-10-CM

## 2023-02-09 NOTE — Therapy (Signed)
OUTPATIENT PHYSICAL THERAPY THORACOLUMBAR EVALUATION   Patient Name: Lisa Greene MRN: [id number] DOB:[birth year], 82 y.o., female Today's Date: 02/09/2023  END OF SESSION:  PT End of Session - 02/09/23 1143     Visit Number 1    Number of Visits 16    Date for PT Re-Evaluation 04/06/23    Authorization Type [employer]    PT Start Time 1104    PT Stop Time 1145    PT Time Calculation (min) 41 min    Activity Tolerance Patient tolerated treatment well    Behavior During Therapy [facility] for tasks assessed/performed              Past Medical History:  Diagnosis Date   ALLERGIC RHINITIS 01/31/2008   Asthma    BCC (basal cell carcinoma)sup& nod 03/01/2017   right nostril   Breast cancer of lower-inner quadrant of left female breast (HCC) 11/01/2015   treated with lumpectomy and radiation   Cataract    Colon cancer screening 05/21/2014   No polyps at age 59. Diverticulosis alone-no more colonoscopies.     DIVERTICULITIS, HX OF 09/07/2008   pt denies this    Eosinophilia 02/08/2009   Fibroid    HYPERLIPIDEMIA 12/20/2006   Hypertension    OSTEOPOROSIS 12/20/2006   PVC's (premature ventricular contractions)    SCC (squamous cell carcinoma) well diff 11/28/2013   left side chin   Vitamin B 12 deficiency    Past Surgical History:  Procedure Laterality Date   BREAST LUMPECTOMY WITH NEEDLE LOCALIZATION Left 01/03/2016   Procedure: BREAST re-excision LUMPECTOMY WITH NEEDLE LOCALIZATION;  Surgeon: Emelia Loron, MD;  Location: [facility];  Service: General;  Laterality: Left;  BREAST re-excision LUMPECTOMY WITH NEEDLE LOCALIZATION   BREAST LUMPECTOMY WITH RADIOACTIVE SEED LOCALIZATION Left 11/28/2015   Procedure: LEFT BREAST LUMPECTOMY WITH BRACKETED RADIOACTIVE SEED LOCALIZATION;  Surgeon: Emelia Loron, MD;  Location: [facility];  Service: General;  Laterality: Left;   BREAST SURGERY     cysts removed x2   CATARACT EXTRACTION BILATERAL W/  ANTERIOR VITRECTOMY  2013   bilateral cataracts   COLONOSCOPY  2005   tics only    FOOT SURGERY     IR KYPHO LUMBAR INC FX REDUCE BONE BX UNI/BIL CANNULATION INC/IMAGING  05/28/2022   IR RADIOLOGIST EVAL & MGMT  05/21/2022   KNEE ARTHROSCOPY     left   NEUROMA SURGERY     x2 feet   thyroid duct cyst     Patient Active Problem List   Diagnosis Date Noted   Lumbar facet arthropathy 08/11/2022   Closed compression fracture of L2 lumbar vertebra, with routine healing, subsequent encounter 08/11/2022   HSV-1 (herpes simplex virus 1) infection 04/28/2022   Pain in left ankle and joints of left foot 06/18/2021   Vasomotor rhinitis 05/10/2019   Chronic allergic conjunctivitis 05/10/2019   PVC (premature ventricular contraction) 04/14/2017   Aortic atherosclerosis (HCC) 11/23/2016   Low vitamin B12 level 06/24/2016   History of breast cancer- Breast cancer of lower-inner quadrant of left female breast 11/01/2015   Stage 3b chronic kidney disease (HCC) 10/22/2015   Family history of abdominal aortic aneurysm 03/08/2015   Asthma, moderate persistent, well-controlled 04/19/2014   Hypertension 11/25/2010   Eosinophilia 02/08/2009   Allergic rhinitis 01/31/2008   Hyperlipidemia 12/20/2006   Osteoporosis 12/20/2006    PCP: Tana Conch  REFERRING PROVIDER: Clementeen Graham  REFERRING DIAG: Lumbar facet arthropathy  Rationale for Evaluation and Treatment: Rehabilitation  THERAPY  DIAG:  Other low back pain  Pain in thoracic spine  ONSET DATE:   SUBJECTIVE:                                                                                                                                                                                           SUBJECTIVE STATEMENT: Pt had pain in mid/low back in December, was found to have L2 compression fracture. She ended up having kyphoplasty, states minimal low back pain at this time. She has also had injections and nerve ablasion as well. Low back at  times hurts after yard work, Catering manager.   She states longstanding pain in R shoulder blade which is her main complaint. States most pain when she is standing and cooking in kitchen. Mild at times, and other times, up to 10/10 She is Taking 1/2 muscle relaxer at night.  Likes to lay flat on back , on floor, for comfort.   PERTINENT HISTORY: Osteoporosis, chronic kidney disease, Breast CA- radiation, HTN,  asthma (mostly non symptomatic)    PAIN:  Are you having pain? Yes: NPRS scale: 3-10 /10 Pain location: R shoulder blade/thoracic region Pain description: sore  Aggravating factors: standing, cooking, increased activity, weeding.   Relieving factors: sitting    PRECAUTIONS: None  WEIGHT BEARING RESTRICTIONS: No  FALLS:  Has patient fallen in last 6 months? No  PLOF: Independent  PATIENT GOALS: decreased pain in thoracic region.    OBJECTIVE:   DIAGNOSTIC FINDINGS:    COGNITION: Overall cognitive status: Within functional limits for tasks assessed     SENSATION:  POSTURE: No Significant postural limitations   PALPATION:   pain and muscle tension in R thoracic paraspinals. Minimal pain into R lateral ribs; mild soreness and stiffness in T-spine with light PA s.   LUMBAR ROM:   Hips ROM: WFL Shoulder ROM: WFL  AROM eval  Flexion WFL   Extension WFL  Right lateral flexion WFL  Left lateral flexion WFL  Right rotation   Left rotation    (Blank rows = not tested)   LOWER EXTREMITY MMT:    MMT Right eval Left eval  Hip flexion 4+ 4+  Hip extension    Hip abduction 4 4  Hip adduction    Hip internal rotation    Hip external rotation    Knee flexion 5 5  Knee extension 5 5  Ankle dorsiflexion    Ankle plantarflexion    Ankle inversion    Ankle eversion     (Blank rows = not tested)  LUMBAR SPECIAL TESTS:     TODAY'S TREATMENT:  DATE:   02/09/2023  Therapeutic Exercise: Aerobic: Supine: shoulder protraction x 10;  Seated: Standing:  QL/side bending stretch x 3 bil, 10 sec.  Stretches:  Neuromuscular Re-education: Manual Therapy: STM to R thoracic paraspinals Modalities:  Moist heat pack x 10 min, to t-spine, seated  Self Care:   PATIENT EDUCATION:  Education details: PT POC, Exam findings, HEP Person educated: Patient Education method: Explanation, Demonstration, Tactile cues, Verbal cues, and Handouts Education comprehension: verbalized understanding, returned demonstration, verbal cues required, tactile cues required, and needs further education  HOME EXERCISE PROGRAM: Access Code: [email or web address]  Access Code: [email or web address]   new  URL: [email or web address] Date: 02/09/2023 Prepared by: Sedalia Muta  Exercises - Supine Bilateral Shoulder Protraction  - 1 x daily - 1 sets - 10 reps - 5 hold - Standing Sidebending with Chair Support  - 1 x daily - 3 reps - 10 hold   ASSESSMENT:  CLINICAL IMPRESSION: Patient presents with primary complaint of increased pain R thoracic spine. She has increased muscle tension and pain with palpation of paraspinals today. She also has stiffness and soreness with t-spine mobilization. She has decreased postural awareness and will benefit from posture education and strengthening. She also has lack of effective HEP for lower back pain that she has had in the past. She has decreased ability for full functional activities standing, IADLs, and cooking due to pain. Pt to benefit from skilled PT to improve deficits and pain.   OBJECTIVE IMPAIRMENTS: decreased activity tolerance, decreased mobility, decreased strength, increased muscle spasms, impaired flexibility, impaired UE functional use, and pain.   ACTIVITY LIMITATIONS: carrying, lifting, bending, standing, squatting, stairs, dressing, reach over head, and locomotion level  PARTICIPATION LIMITATIONS: meal  prep, cleaning, laundry, shopping, community activity, and yard work  PERSONAL FACTORS:  none  are also affecting patient's functional outcome.   REHAB POTENTIAL: Good  CLINICAL DECISION MAKING: Stable/uncomplicated  EVALUATION COMPLEXITY: Low   GOALS: Goals reviewed with patient? Yes   SHORT TERM GOALS: Target date: 02/23/2023  Pt to be independent with initial HEP  Goal status: INITIAL    LONG TERM GOALS: Target date: 04/06/2023   Pt to be independent with final HEP  Goal status: INITIAL  2.  Pt to report decreased pain in R thoracic region to 0-3/10 with standing activity and cooking for at least 30 min.    Goal status: INITIAL   3.  Pt to demo improved soft tissue restrictions  by at least 50% to improve pain in t-spine.   Goal status: INITIAL    PLAN:  PT FREQUENCY: 1-2x/week  PT DURATION: 8 weeks  PLANNED INTERVENTIONS: Therapeutic exercises, Therapeutic activity, Neuromuscular re-education, Balance training, Gait training, Patient/Family education, Self Care, Joint mobilization, Joint manipulation, Stair training, DME instructions, Dry Needling, Spinal manipulation, Spinal mobilization, Cryotherapy, Moist heat, Taping, Traction, Ultrasound, Ionotophoresis 4mg /ml Dexamethasone, and Manual therapy.  PLAN FOR NEXT SESSION:  education on light lumbar HEP, SKTC, start light core strength when able. STM for R thoracic paraspinals, Thoracic mobilization, Posture education and strengthening.     Sedalia Muta, PT, DPT 12:00 PM  02/09/23

## 2023-02-11 ENCOUNTER — Encounter: Payer: Self-pay | Admitting: Physical Therapy

## 2023-02-11 ENCOUNTER — Ambulatory Visit: Payer: Medicare HMO | Admitting: Physical Therapy

## 2023-02-11 DIAGNOSIS — M5459 Other low back pain: Secondary | ICD-10-CM

## 2023-02-11 DIAGNOSIS — M546 Pain in thoracic spine: Secondary | ICD-10-CM | POA: Diagnosis not present

## 2023-02-11 NOTE — Therapy (Signed)
OUTPATIENT PHYSICAL THERAPY THORACOLUMBAR TREATMENT    Patient Name: Lisa Greene MRN: [id number] DOB:[birth year], 82 y.o., female Today's Date: 02/11/2023  END OF SESSION:  PT End of Session - 02/11/23 1216     Visit Number 2    Number of Visits 16    Date for PT Re-Evaluation 04/06/23    Authorization Type [employer]    PT Start Time 1019    PT Stop Time 1100    PT Time Calculation (min) 41 min    Activity Tolerance Patient tolerated treatment well    Behavior During Therapy WFL for tasks assessed/performed               Past Medical History:  Diagnosis Date   ALLERGIC RHINITIS 01/31/2008   Asthma    BCC (basal cell carcinoma)sup& nod 03/01/2017   right nostril   Breast cancer of lower-inner quadrant of left female breast (HCC) 11/01/2015   treated with lumpectomy and radiation   Cataract    Colon cancer screening 05/21/2014   No polyps at age 52. Diverticulosis alone-no more colonoscopies.     DIVERTICULITIS, HX OF 09/07/2008   pt denies this    Eosinophilia 02/08/2009   Fibroid    HYPERLIPIDEMIA 12/20/2006   Hypertension    OSTEOPOROSIS 12/20/2006   PVC's (premature ventricular contractions)    SCC (squamous cell carcinoma) well diff 11/28/2013   left side chin   Vitamin B 12 deficiency    Past Surgical History:  Procedure Laterality Date   BREAST LUMPECTOMY WITH NEEDLE LOCALIZATION Left 01/03/2016   Procedure: BREAST re-excision LUMPECTOMY WITH NEEDLE LOCALIZATION;  Surgeon: Emelia Loron, MD;  Location: [facility];  Service: General;  Laterality: Left;  BREAST re-excision LUMPECTOMY WITH NEEDLE LOCALIZATION   BREAST LUMPECTOMY WITH RADIOACTIVE SEED LOCALIZATION Left 11/28/2015   Procedure: LEFT BREAST LUMPECTOMY WITH BRACKETED RADIOACTIVE SEED LOCALIZATION;  Surgeon: Emelia Loron, MD;  Location: [facility];  Service: General;  Laterality: Left;   BREAST SURGERY     cysts removed x2   CATARACT EXTRACTION BILATERAL W/  ANTERIOR VITRECTOMY  2013   bilateral cataracts   COLONOSCOPY  2005   tics only    FOOT SURGERY     IR KYPHO LUMBAR INC FX REDUCE BONE BX UNI/BIL CANNULATION INC/IMAGING  05/28/2022   IR RADIOLOGIST EVAL & MGMT  05/21/2022   KNEE ARTHROSCOPY     left   NEUROMA SURGERY     x2 feet   thyroid duct cyst     Patient Active Problem List   Diagnosis Date Noted   Lumbar facet arthropathy 08/11/2022   Closed compression fracture of L2 lumbar vertebra, with routine healing, subsequent encounter 08/11/2022   HSV-1 (herpes simplex virus 1) infection 04/28/2022   Pain in left ankle and joints of left foot 06/18/2021   Vasomotor rhinitis 05/10/2019   Chronic allergic conjunctivitis 05/10/2019   PVC (premature ventricular contraction) 04/14/2017   Aortic atherosclerosis (HCC) 11/23/2016   Low vitamin B12 level 06/24/2016   History of breast cancer- Breast cancer of lower-inner quadrant of left female breast 11/01/2015   Stage 3b chronic kidney disease (HCC) 10/22/2015   Family history of abdominal aortic aneurysm 03/08/2015   Asthma, moderate persistent, well-controlled 04/19/2014   Hypertension 11/25/2010   Eosinophilia 02/08/2009   Allergic rhinitis 01/31/2008   Hyperlipidemia 12/20/2006   Osteoporosis 12/20/2006    PCP: Tana Conch  REFERRING PROVIDER: Clementeen Graham  REFERRING DIAG: Lumbar facet arthropathy  Rationale for Evaluation and Treatment: Rehabilitation  THERAPY DIAG:  Other low back pain  Pain in thoracic spine  ONSET DATE:   SUBJECTIVE:                                                                                                                                                                                           SUBJECTIVE STATEMENT: 02/11/2023 Pt states thoracic pain improving. Has newer pain yesterday in L low back, thinks she pulled a muscle.   Eval: Pt had pain in mid/low back in December, was found to have L2 compression fracture. She ended up having  kyphoplasty, states minimal low back pain at this time. She has also had injections and nerve ablasion as well. 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   She states longstanding pain in R shoulder blade which is her main complaint. States most pain when she is standing and cooking in kitchen. Mild at times, and other times, up to 10/10 She is Taking 1/2 muscle relaxer at night.  Likes to lay flat on back , on floor, for comfort.   PERTINENT HISTORY: Osteoporosis, chronic kidney disease, Breast CA- radiation, HTN,  asthma (mostly non symptomatic)    PAIN:  Are you having pain? Yes: NPRS scale: 3-10 /10 Pain location: R shoulder blade/thoracic region Pain description: sore  Aggravating factors: standing, cooking, increased activity, weeding.   Relieving factors: sitting    PRECAUTIONS: None  WEIGHT BEARING RESTRICTIONS: No  FALLS:  Has patient fallen in last 6 months? No  PLOF: Independent  PATIENT GOALS: decreased pain in thoracic region.    OBJECTIVE:   DIAGNOSTIC FINDINGS:    COGNITION: Overall cognitive status: Within functional limits for tasks assessed     SENSATION:  POSTURE: No Significant postural limitations   PALPATION:   pain and muscle tension in R thoracic paraspinals. Minimal pain into R lateral ribs; mild soreness and stiffness in T-spine with light PA s.   LUMBAR ROM:   Hips ROM: WFL Shoulder ROM: WFL  AROM eval  Flexion WFL   Extension WFL  Right lateral flexion WFL  Left lateral flexion WFL  Right rotation   Left rotation    (Blank rows = not tested)   LOWER EXTREMITY MMT:    MMT Right eval Left eval  Hip flexion 4+ 4+  Hip extension    Hip abduction 4 4  Hip adduction    Hip internal rotation    Hip external rotation    Knee flexion 5 5  Knee extension 5 5  Ankle dorsiflexion    Ankle plantarflexion    Ankle inversion    Ankle eversion     (  Blank rows = not tested)  LUMBAR SPECIAL TESTS:     TODAY'S TREATMENT:                                                                                                                               DATE:   02/11/2023 Therapeutic Exercise: Aerobic: Supine:  shoulder flexion with cane for lat stretch x 10;  Seated:  shoulder protraction x 10;  Standing:  QL/side bending stretch x 3 bil, 10 sec.  Scap squeeze x 10;  Stretches:  LTR x 15;   SKTC 30 sec x 3 Neuromuscular Re-education: Manual Therapy;   STM to R thoracic paraspinals and L lumbar paraspinals.  Modalities:   Self Care:   Therapeutic Exercise: Aerobic: Supine: shoulder protraction x 10;  Seated: Standing:  QL/side bending stretch x 3 bil, 10 sec.  Stretches:  Neuromuscular Re-education: Manual Therapy: STM to R thoracic paraspinals Modalities:  Moist heat pack x 10 min, to t-spine, seated  Self Care:   PATIENT EDUCATION:  Education details: PT POC, Exam findings, HEP Person educated: Patient Education method: Explanation, Demonstration, Tactile cues, Verbal cues, and Handouts Education comprehension: verbalized understanding, returned demonstration, verbal cues required, tactile cues required, and needs further education  HOME EXERCISE PROGRAM: Access Code: [email or web address] previous  Access Code: [email or web address]   new    ASSESSMENT:  CLINICAL IMPRESSION: 02/11/2023  Pt with improving soreness in thoracic region. Reviewed ther ex, and need to go gentle with these. She does have increased soreness in L low back today, addressed with stretching and STM today, pt with improved pain after session. Added light lumbar mobility to HEP for this. Plan to progress as tolerated.   Eval: Patient presents with primary complaint of increased pain R thoracic spine. She has increased muscle tension and pain with palpation of paraspinals today. She also has stiffness and soreness with t-spine mobilization. She has decreased postural awareness and will benefit from posture education and strengthening. She also has lack of  effective HEP for lower back pain that she has had in the past. She has decreased ability for full functional activities standing, IADLs, and cooking due to pain. Pt to benefit from skilled PT to improve deficits and pain.   OBJECTIVE IMPAIRMENTS: decreased activity tolerance, decreased mobility, decreased strength, increased muscle spasms, impaired flexibility, impaired UE functional use, and pain.   ACTIVITY LIMITATIONS: carrying, lifting, bending, standing, squatting, stairs, dressing, reach over head, and locomotion level  PARTICIPATION LIMITATIONS: meal prep, cleaning, laundry, shopping, community activity, and yard work  PERSONAL FACTORS:  none  are also affecting patient's functional outcome.   REHAB POTENTIAL: Good  CLINICAL DECISION MAKING: Stable/uncomplicated  EVALUATION COMPLEXITY: Low   GOALS: Goals reviewed with patient? Yes   SHORT TERM GOALS: Target date: 02/23/2023  Pt to be independent with initial HEP  Goal status: INITIAL    LONG TERM GOALS: Target date: 04/06/2023   Pt to be independent with final HEP  Goal  status: INITIAL  2.  Pt to report decreased pain in R thoracic region to 0-3/10 with standing activity and cooking for at least 30 min.    Goal status: INITIAL   3.  Pt to demo improved soft tissue restrictions  by at least 50% to improve pain in t-spine.   Goal status: INITIAL    PLAN:  PT FREQUENCY: 1-2x/week  PT DURATION: 8 weeks  PLANNED INTERVENTIONS: Therapeutic exercises, Therapeutic activity, Neuromuscular re-education, Balance training, Gait training, Patient/Family education, Self Care, Joint mobilization, Joint manipulation, Stair training, DME instructions, Dry Needling, Spinal manipulation, Spinal mobilization, Cryotherapy, Moist heat, Taping, Traction, Ultrasound, Ionotophoresis 4mg /ml Dexamethasone, and Manual therapy.  PLAN FOR NEXT SESSION:  education on light lumbar HEP, SKTC, start light core strength when able. STM for R  thoracic paraspinals, Thoracic mobilization, Posture education and strengthening.     Sedalia Muta, PT, DPT 12:18 PM  02/11/23

## 2023-02-15 ENCOUNTER — Encounter: Payer: Self-pay | Admitting: Physical Therapy

## 2023-02-15 ENCOUNTER — Ambulatory Visit: Payer: Medicare HMO | Admitting: Physical Therapy

## 2023-02-15 DIAGNOSIS — M546 Pain in thoracic spine: Secondary | ICD-10-CM

## 2023-02-15 DIAGNOSIS — M5459 Other low back pain: Secondary | ICD-10-CM | POA: Diagnosis not present

## 2023-02-15 NOTE — Therapy (Signed)
OUTPATIENT PHYSICAL THERAPY THORACOLUMBAR TREATMENT    Patient Name: Lisa Greene MRN: [id number] DOB:[birth year], 82 y.o., female Today's Date: 02/15/2023  END OF SESSION:  PT End of Session - 02/15/23 1100     Visit Number 3    Number of Visits 16    Date for PT Re-Evaluation 04/06/23    Authorization Type [employer]    PT Start Time 1104    PT Stop Time 1145    PT Time Calculation (min) 41 min    Activity Tolerance Patient tolerated treatment well    Behavior During Therapy [facility] for tasks assessed/performed               Past Medical History:  Diagnosis Date   ALLERGIC RHINITIS 01/31/2008   Asthma    BCC (basal cell carcinoma)sup& nod 03/01/2017   right nostril   Breast cancer of lower-inner quadrant of left female breast (HCC) 11/01/2015   treated with lumpectomy and radiation   Cataract    Colon cancer screening 05/21/2014   No polyps at age 25. Diverticulosis alone-no more colonoscopies.     DIVERTICULITIS, HX OF 09/07/2008   pt denies this    Eosinophilia 02/08/2009   Fibroid    HYPERLIPIDEMIA 12/20/2006   Hypertension    OSTEOPOROSIS 12/20/2006   PVC's (premature ventricular contractions)    SCC (squamous cell carcinoma) well diff 11/28/2013   left side chin   Vitamin B 12 deficiency    Past Surgical History:  Procedure Laterality Date   BREAST LUMPECTOMY WITH NEEDLE LOCALIZATION Left 01/03/2016   Procedure: BREAST re-excision LUMPECTOMY WITH NEEDLE LOCALIZATION;  Surgeon: Emelia Loron, MD;  Location: [facility];  Service: General;  Laterality: Left;  BREAST re-excision LUMPECTOMY WITH NEEDLE LOCALIZATION   BREAST LUMPECTOMY WITH RADIOACTIVE SEED LOCALIZATION Left 11/28/2015   Procedure: LEFT BREAST LUMPECTOMY WITH BRACKETED RADIOACTIVE SEED LOCALIZATION;  Surgeon: Emelia Loron, MD;  Location: [facility];  Service: General;  Laterality: Left;   BREAST SURGERY     cysts removed x2   CATARACT EXTRACTION BILATERAL W/  ANTERIOR VITRECTOMY  2013   bilateral cataracts   COLONOSCOPY  2005   tics only    FOOT SURGERY     IR KYPHO LUMBAR INC FX REDUCE BONE BX UNI/BIL CANNULATION INC/IMAGING  05/28/2022   IR RADIOLOGIST EVAL & MGMT  05/21/2022   KNEE ARTHROSCOPY     left   NEUROMA SURGERY     x2 feet   thyroid duct cyst     Patient Active Problem List   Diagnosis Date Noted   Lumbar facet arthropathy 08/11/2022   Closed compression fracture of L2 lumbar vertebra, with routine healing, subsequent encounter 08/11/2022   HSV-1 (herpes simplex virus 1) infection 04/28/2022   Pain in left ankle and joints of left foot 06/18/2021   Vasomotor rhinitis 05/10/2019   Chronic allergic conjunctivitis 05/10/2019   PVC (premature ventricular contraction) 04/14/2017   Aortic atherosclerosis (HCC) 11/23/2016   Low vitamin B12 level 06/24/2016   History of breast cancer- Breast cancer of lower-inner quadrant of left female breast 11/01/2015   Stage 3b chronic kidney disease (HCC) 10/22/2015   Family history of abdominal aortic aneurysm 03/08/2015   Asthma, moderate persistent, well-controlled 04/19/2014   Hypertension 11/25/2010   Eosinophilia 02/08/2009   Allergic rhinitis 01/31/2008   Hyperlipidemia 12/20/2006   Osteoporosis 12/20/2006    PCP: Tana Conch  REFERRING PROVIDER: Clementeen Graham  REFERRING DIAG: Lumbar facet arthropathy  Rationale for Evaluation and Treatment: Rehabilitation  THERAPY DIAG:  Other low back pain  Pain in thoracic spine  ONSET DATE:   SUBJECTIVE:                                                                                                                                                                                           SUBJECTIVE STATEMENT: 02/15/2023 Pt reports minimal pain. L low back is better. Bil low back just a bit sore.   Eval: Pt had pain in mid/low back in December, was found to have L2 compression fracture. She ended up having kyphoplasty, states minimal  low back pain at this time. She has also had injections and nerve ablasion as well. 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   She states longstanding pain in R shoulder blade which is her main complaint. States most pain when she is standing and cooking in kitchen. Mild at times, and other times, up to 10/10 She is Taking 1/2 muscle relaxer at night.  Likes to lay flat on back , on floor, for comfort.   PERTINENT HISTORY: Osteoporosis, chronic kidney disease, Breast CA- radiation, HTN,  asthma (mostly non symptomatic)    PAIN:  Are you having pain? Yes: NPRS scale: 3-10 /10 Pain location: R shoulder blade/thoracic region Pain description: sore  Aggravating factors: standing, cooking, increased activity, weeding.   Relieving factors: sitting    PRECAUTIONS: None  WEIGHT BEARING RESTRICTIONS: No  FALLS:  Has patient fallen in last 6 months? No  PLOF: Independent  PATIENT GOALS: decreased pain in thoracic region.    OBJECTIVE:   DIAGNOSTIC FINDINGS:    COGNITION: Overall cognitive status: Within functional limits for tasks assessed     SENSATION:  POSTURE: No Significant postural limitations   PALPATION:   pain and muscle tension in R thoracic paraspinals. Minimal pain into R lateral ribs; mild soreness and stiffness in T-spine with light PA s.   LUMBAR ROM:   Hips ROM: WFL Shoulder ROM: WFL  AROM eval  Flexion WFL   Extension WFL  Right lateral flexion WFL  Left lateral flexion WFL  Right rotation   Left rotation    (Blank rows = not tested)   LOWER EXTREMITY MMT:    MMT Right eval Left eval  Hip flexion 4+ 4+  Hip extension    Hip abduction 4 4  Hip adduction    Hip internal rotation    Hip external rotation    Knee flexion 5 5  Knee extension 5 5  Ankle dorsiflexion    Ankle plantarflexion    Ankle inversion    Ankle eversion     (Blank  rows = not tested)  LUMBAR SPECIAL TESTS:     TODAY'S TREATMENT:                                                                                                                               DATE:   02/15/2023 Therapeutic Exercise: Aerobic: Supine:  pelvic tilts x 15;  shoulder flexion with cane for lat stretch x 10;  bridging x 10;  SLR x 10 bil with TA;  Seated:  sit to stand x 10, education on hip hinge and back mechanics.  Standing:  QL/side bending stretch x 3 bil, 10 sec.   Rows Gtb x 15 ; shoulder flex/scaption x 10 with education on back posture :  Stretches:  LTR x 15;   SKTC 30 sec x 3 Neuromuscular Re-education: Manual Therapy;    Modalities:      Therapeutic Exercise: Aerobic: Supine:  shoulder flexion with cane for lat stretch x 10;  Seated:  shoulder protraction x 10;  Standing:  QL/side bending stretch x 3 bil, 10 sec.  Scap squeeze x 10;  Stretches:  LTR x 15;   SKTC 30 sec x 3 Neuromuscular Re-education: Manual Therapy;   STM to R thoracic paraspinals and L lumbar paraspinals.  Modalities:   Self Care:   Therapeutic Exercise: Aerobic: Supine: shoulder protraction x 10;  Seated: Standing:  QL/side bending stretch x 3 bil, 10 sec.  Stretches:  Neuromuscular Re-education: Manual Therapy: STM to R thoracic paraspinals Modalities:  Moist heat pack x 10 min, to t-spine, seated  Self Care:   PATIENT EDUCATION:  Education details: updated and reviewed HEP Person educated: Patient Education method: Explanation, Demonstration, Tactile cues, Verbal cues, and Handouts Education comprehension: verbalized understanding, returned demonstration, verbal cues required, tactile cues required, and needs further education  HOME EXERCISE PROGRAM: Access Code: [email or web address] previous  Access Code: [email or web address]   new    ASSESSMENT:  CLINICAL IMPRESSION: 02/15/2023   Pt with improving soreness in thoracic region but still sore intermittently. She requires cuing to slow down exercises and go go gentile. Added light core/hip strength today. Pt to benefit from continued Thoracic  and lumbar mobility and progressive strength. Has slight pinch/pain at mid t-spine with full shoulder elevation today.   Eval: Patient presents with primary complaint of increased pain R thoracic spine. She has increased muscle tension and pain with palpation of paraspinals today. She also has stiffness and soreness with t-spine mobilization. She has decreased postural awareness and will benefit from posture education and strengthening. She also has lack of effective HEP for lower back pain that she has had in the past. She has decreased ability for full functional activities standing, IADLs, and cooking due to pain. Pt to benefit from skilled PT to improve deficits and pain.   OBJECTIVE IMPAIRMENTS: decreased activity tolerance, decreased mobility, decreased strength, increased muscle spasms, impaired flexibility, impaired UE functional use, and pain.   ACTIVITY LIMITATIONS: carrying, lifting, bending, standing,  squatting, stairs, dressing, reach over head, and locomotion level  PARTICIPATION LIMITATIONS: meal prep, cleaning, laundry, shopping, community activity, and yard work  PERSONAL FACTORS:  none  are also affecting patient's functional outcome.   REHAB POTENTIAL: Good  CLINICAL DECISION MAKING: Stable/uncomplicated  EVALUATION COMPLEXITY: Low   GOALS: Goals reviewed with patient? Yes   SHORT TERM GOALS: Target date: 02/23/2023  Pt to be independent with initial HEP  Goal status: INITIAL    LONG TERM GOALS: Target date: 04/06/2023   Pt to be independent with final HEP  Goal status: INITIAL  2.  Pt to report decreased pain in R thoracic region to 0-3/10 with standing activity and cooking for at least 30 min.    Goal status: INITIAL   3.  Pt to demo improved soft tissue restrictions  by at least 50% to improve pain in t-spine.   Goal status: INITIAL    PLAN:  PT FREQUENCY: 1-2x/week  PT DURATION: 8 weeks  PLANNED INTERVENTIONS: Therapeutic exercises,  Therapeutic activity, Neuromuscular re-education, Balance training, Gait training, Patient/Family education, Self Care, Joint mobilization, Joint manipulation, Stair training, DME instructions, Dry Needling, Spinal manipulation, Spinal mobilization, Cryotherapy, Moist heat, Taping, Traction, Ultrasound, Ionotophoresis 4mg /ml Dexamethasone, and Manual therapy.  PLAN FOR NEXT SESSION:  education on light lumbar HEP, SKTC, start light core strength when able. STM for R thoracic paraspinals, Thoracic mobilization, Posture education and strengthening.     Sedalia Muta, PT, DPT 11:46 AM  02/15/23

## 2023-02-18 ENCOUNTER — Ambulatory Visit: Payer: Medicare HMO | Admitting: Physical Therapy

## 2023-02-18 ENCOUNTER — Encounter: Payer: Self-pay | Admitting: Physical Therapy

## 2023-02-18 DIAGNOSIS — M5459 Other low back pain: Secondary | ICD-10-CM

## 2023-02-18 DIAGNOSIS — M546 Pain in thoracic spine: Secondary | ICD-10-CM | POA: Diagnosis not present

## 2023-02-18 NOTE — Therapy (Signed)
OUTPATIENT PHYSICAL THERAPY THORACOLUMBAR TREATMENT    Patient Name: Lisa Greene MRN: [id number] DOB:[birth year], 82 y.o., female Today's Date: 02/18/2023  END OF SESSION:  PT End of Session - 02/18/23 1015     Visit Number 4    Number of Visits 16    Date for PT Re-Evaluation 04/06/23    Authorization Type [employer]    PT Start Time 1016    PT Stop Time 1100    PT Time Calculation (min) 44 min    Activity Tolerance Patient tolerated treatment well    Behavior During Therapy WFL for tasks assessed/performed               Past Medical History:  Diagnosis Date   ALLERGIC RHINITIS 01/31/2008   Asthma    BCC (basal cell carcinoma)sup& nod 03/01/2017   right nostril   Breast cancer of lower-inner quadrant of left female breast (HCC) 11/01/2015   treated with lumpectomy and radiation   Cataract    Colon cancer screening 05/21/2014   No polyps at age 79. Diverticulosis alone-no more colonoscopies.     DIVERTICULITIS, HX OF 09/07/2008   pt denies this    Eosinophilia 02/08/2009   Fibroid    HYPERLIPIDEMIA 12/20/2006   Hypertension    OSTEOPOROSIS 12/20/2006   PVC's (premature ventricular contractions)    SCC (squamous cell carcinoma) well diff 11/28/2013   left side chin   Vitamin B 12 deficiency    Past Surgical History:  Procedure Laterality Date   BREAST LUMPECTOMY WITH NEEDLE LOCALIZATION Left 01/03/2016   Procedure: BREAST re-excision LUMPECTOMY WITH NEEDLE LOCALIZATION;  Surgeon: Emelia Loron, MD;  Location: [facility];  Service: General;  Laterality: Left;  BREAST re-excision LUMPECTOMY WITH NEEDLE LOCALIZATION   BREAST LUMPECTOMY WITH RADIOACTIVE SEED LOCALIZATION Left 11/28/2015   Procedure: LEFT BREAST LUMPECTOMY WITH BRACKETED RADIOACTIVE SEED LOCALIZATION;  Surgeon: Emelia Loron, MD;  Location: [facility];  Service: General;  Laterality: Left;   BREAST SURGERY     cysts removed x2   CATARACT EXTRACTION BILATERAL W/  ANTERIOR VITRECTOMY  2013   bilateral cataracts   COLONOSCOPY  2005   tics only    FOOT SURGERY     IR KYPHO LUMBAR INC FX REDUCE BONE BX UNI/BIL CANNULATION INC/IMAGING  05/28/2022   IR RADIOLOGIST EVAL & MGMT  05/21/2022   KNEE ARTHROSCOPY     left   NEUROMA SURGERY     x2 feet   thyroid duct cyst     Patient Active Problem List   Diagnosis Date Noted   Lumbar facet arthropathy 08/11/2022   Closed compression fracture of L2 lumbar vertebra, with routine healing, subsequent encounter 08/11/2022   HSV-1 (herpes simplex virus 1) infection 04/28/2022   Pain in left ankle and joints of left foot 06/18/2021   Vasomotor rhinitis 05/10/2019   Chronic allergic conjunctivitis 05/10/2019   PVC (premature ventricular contraction) 04/14/2017   Aortic atherosclerosis (HCC) 11/23/2016   Low vitamin B12 level 06/24/2016   History of breast cancer- Breast cancer of lower-inner quadrant of left female breast 11/01/2015   Stage 3b chronic kidney disease (HCC) 10/22/2015   Family history of abdominal aortic aneurysm 03/08/2015   Asthma, moderate persistent, well-controlled 04/19/2014   Hypertension 11/25/2010   Eosinophilia 02/08/2009   Allergic rhinitis 01/31/2008   Hyperlipidemia 12/20/2006   Osteoporosis 12/20/2006    PCP: Tana Conch  REFERRING PROVIDER: Clementeen Graham  REFERRING DIAG: Lumbar facet arthropathy  Rationale for Evaluation and Treatment: Rehabilitation  THERAPY DIAG:  Other low back pain  Pain in thoracic spine  ONSET DATE:   SUBJECTIVE:                                                                                                                                                                                           SUBJECTIVE STATEMENT: 02/18/2023  Pt reports soreness in R shoulder blade region  Eval: Pt had pain in mid/low back in December, was found to have L2 compression fracture. She ended up having kyphoplasty, states minimal low back pain at this time.  She has also had injections and nerve ablasion as well. 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   She states longstanding pain in R shoulder blade which is her main complaint. States most pain when she is standing and cooking in kitchen. Mild at times, and other times, up to 10/10 She is Taking 1/2 muscle relaxer at night.  Likes to lay flat on back , on floor, for comfort.   PERTINENT HISTORY: Osteoporosis, chronic kidney disease, Breast CA- radiation, HTN,  asthma (mostly non symptomatic)    PAIN:  Are you having pain? Yes: NPRS scale: 3-10 /10 Pain location: R shoulder blade/thoracic region Pain description: sore  Aggravating factors: standing, cooking, increased activity, weeding.   Relieving factors: sitting    PRECAUTIONS: None  WEIGHT BEARING RESTRICTIONS: No  FALLS:  Has patient fallen in last 6 months? No  PLOF: Independent  PATIENT GOALS: decreased pain in thoracic region.    OBJECTIVE:   DIAGNOSTIC FINDINGS:    COGNITION: Overall cognitive status: Within functional limits for tasks assessed     SENSATION:  POSTURE: No Significant postural limitations   PALPATION:   pain and muscle tension in R thoracic paraspinals. Minimal pain into R lateral ribs; mild soreness and stiffness in T-spine with light PA s.   LUMBAR ROM:   Hips ROM: WFL Shoulder ROM: WFL  AROM eval  Flexion WFL   Extension WFL  Right lateral flexion WFL  Left lateral flexion WFL  Right rotation   Left rotation    (Blank rows = not tested)   LOWER EXTREMITY MMT:    MMT Right eval Left eval  Hip flexion 4+ 4+  Hip extension    Hip abduction 4 4  Hip adduction    Hip internal rotation    Hip external rotation    Knee flexion 5 5  Knee extension 5 5  Ankle dorsiflexion    Ankle plantarflexion    Ankle inversion    Ankle eversion     (Blank rows = not tested)  LUMBAR SPECIAL TESTS:  TODAY'S TREATMENT:                                                                                                                               DATE:   02/18/2023 Therapeutic Exercise: Aerobic: Supine:  pelvic tilts x 15;    SLR x 10 bil with TA;  Clams GTB with TA x 20;  Seated:  light thoracic extension in chair with towel roll at back 2 x 5;  Thoracic rot x 5 bil;  Standing:  shoulder flex/scaption x 10 with education on back posture , scap squeezes 2 x 10;  Stretches:  LTR x 15;   SKTC 30 sec x 3,  supine SA reaches 2 x 5, slow;   Neuromuscular Re-education: Manual Therapy;   Mid thoracic PA mobs, light grade II, III;    Modalities:     Therapeutic Exercise: Aerobic: Supine:  pelvic tilts x 15;  shoulder flexion with cane for lat stretch x 10;  bridging x 10;  SLR x 10 bil with TA;  Seated:  sit to stand x 10, education on hip hinge and back mechanics.  Standing:  QL/side bending stretch x 3 bil, 10 sec.   Rows Gtb x 15 ; shoulder flex/scaption x 10 with education on back posture :  Stretches:  LTR x 15;   SKTC 30 sec x 3 Neuromuscular Re-education: Manual Therapy;    Modalities:      Therapeutic Exercise: Aerobic: Supine:  shoulder flexion with cane for lat stretch x 10;  Seated:  shoulder protraction x 10;  Standing:  QL/side bending stretch x 3 bil, 10 sec.  Scap squeeze x 10;  Stretches:  LTR x 15;   SKTC 30 sec x 3 Neuromuscular Re-education: Manual Therapy;   STM to R thoracic paraspinals and L lumbar paraspinals.  Modalities:   Self Care:   Therapeutic Exercise: Aerobic: Supine: shoulder protraction x 10;  Seated: Standing:  QL/side bending stretch x 3 bil, 10 sec.  Stretches:  Neuromuscular Re-education: Manual Therapy: STM to R thoracic paraspinals Modalities:  Moist heat pack x 10 min, to t-spine, seated  Self Care:   PATIENT EDUCATION:  Education details: updated and reviewed HEP Person educated: Patient Education method: Explanation, Demonstration, Tactile cues, Verbal cues, and Handouts Education comprehension: verbalized  understanding, returned demonstration, verbal cues required, tactile cues required, and needs further education  HOME EXERCISE PROGRAM: Access Code: [email or web address] previous  Access Code: [email or web address]   new    ASSESSMENT:  CLINICAL IMPRESSION: 02/18/2023   Pt with continued soreness in R thoracic region. Mild soreness with mobilization and palpation today. Plan to continue more muscle tension release to this area next visit. She has pain in this region, also with full UE flexion. Trial for light/small ROM thoracic extension to improve thoracic mobility. Pt with slight pull in front of torso with this, will benefit from improving thoracic mobility as able.   Eval: Patient presents with primary complaint  of increased pain R thoracic spine. She has increased muscle tension and pain with palpation of paraspinals today. She also has stiffness and soreness with t-spine mobilization. She has decreased postural awareness and will benefit from posture education and strengthening. She also has lack of effective HEP for lower back pain that she has had in the past. She has decreased ability for full functional activities standing, IADLs, and cooking due to pain. Pt to benefit from skilled PT to improve deficits and pain.   OBJECTIVE IMPAIRMENTS: decreased activity tolerance, decreased mobility, decreased strength, increased muscle spasms, impaired flexibility, impaired UE functional use, and pain.   ACTIVITY LIMITATIONS: carrying, lifting, bending, standing, squatting, stairs, dressing, reach over head, and locomotion level  PARTICIPATION LIMITATIONS: meal prep, cleaning, laundry, shopping, community activity, and yard work  PERSONAL FACTORS:  none  are also affecting patient's functional outcome.   REHAB POTENTIAL: Good  CLINICAL DECISION MAKING: Stable/uncomplicated  EVALUATION COMPLEXITY: Low   GOALS: Goals reviewed with patient? Yes   SHORT TERM GOALS: Target date: 02/23/2023  Pt to be independent  with initial HEP  Goal status: INITIAL    LONG TERM GOALS: Target date: 04/06/2023   Pt to be independent with final HEP  Goal status: INITIAL  2.  Pt to report decreased pain in R thoracic region to 0-3/10 with standing activity and cooking for at least 30 min.    Goal status: INITIAL   3.  Pt to demo improved soft tissue restrictions  by at least 50% to improve pain in t-spine.   Goal status: INITIAL    PLAN:  PT FREQUENCY: 1-2x/week  PT DURATION: 8 weeks  PLANNED INTERVENTIONS: Therapeutic exercises, Therapeutic activity, Neuromuscular re-education, Balance training, Gait training, Patient/Family education, Self Care, Joint mobilization, Joint manipulation, Stair training, DME instructions, Dry Needling, Spinal manipulation, Spinal mobilization, Cryotherapy, Moist heat, Taping, Traction, Ultrasound, Ionotophoresis 4mg /ml Dexamethasone, and Manual therapy.  PLAN FOR NEXT SESSION:  education on light lumbar HEP, SKTC, start light core strength when able. STM for R thoracic paraspinals, Thoracic mobilization, Posture education and strengthening.     Sedalia Muta, PT, DPT 10:16 AM  02/18/23

## 2023-02-25 ENCOUNTER — Ambulatory Visit: Payer: Medicare HMO | Admitting: Physical Therapy

## 2023-02-25 ENCOUNTER — Encounter: Payer: Self-pay | Admitting: Physical Therapy

## 2023-02-25 DIAGNOSIS — M546 Pain in thoracic spine: Secondary | ICD-10-CM | POA: Diagnosis not present

## 2023-02-25 DIAGNOSIS — M5459 Other low back pain: Secondary | ICD-10-CM | POA: Diagnosis not present

## 2023-02-25 NOTE — Therapy (Signed)
OUTPATIENT PHYSICAL THERAPY THORACOLUMBAR TREATMENT    Patient Name: KIYAN SIWEK MRN: [id number] DOB:[birth year], 82 y.o., female Today's Date: 02/25/2023  END OF SESSION:  PT End of Session - 02/25/23 1055     Visit Number 5    Number of Visits 16    Date for PT Re-Evaluation 04/06/23    Authorization Type [employer]    PT Start Time 1059    PT Stop Time 1142    PT Time Calculation (min) 43 min    Activity Tolerance Patient tolerated treatment well    Behavior During Therapy WFL for tasks assessed/performed               Past Medical History:  Diagnosis Date   ALLERGIC RHINITIS 01/31/2008   Asthma    BCC (basal cell carcinoma)sup& nod 03/01/2017   right nostril   Breast cancer of lower-inner quadrant of left female breast (HCC) 11/01/2015   treated with lumpectomy and radiation   Cataract    Colon cancer screening 05/21/2014   No polyps at age 49. Diverticulosis alone-no more colonoscopies.     DIVERTICULITIS, HX OF 09/07/2008   pt denies this    Eosinophilia 02/08/2009   Fibroid    HYPERLIPIDEMIA 12/20/2006   Hypertension    OSTEOPOROSIS 12/20/2006   PVC's (premature ventricular contractions)    SCC (squamous cell carcinoma) well diff 11/28/2013   left side chin   Vitamin B 12 deficiency    Past Surgical History:  Procedure Laterality Date   BREAST LUMPECTOMY WITH NEEDLE LOCALIZATION Left 01/03/2016   Procedure: BREAST re-excision LUMPECTOMY WITH NEEDLE LOCALIZATION;  Surgeon: Emelia Loron, MD;  Location: [facility];  Service: General;  Laterality: Left;  BREAST re-excision LUMPECTOMY WITH NEEDLE LOCALIZATION   BREAST LUMPECTOMY WITH RADIOACTIVE SEED LOCALIZATION Left 11/28/2015   Procedure: LEFT BREAST LUMPECTOMY WITH BRACKETED RADIOACTIVE SEED LOCALIZATION;  Surgeon: Emelia Loron, MD;  Location: [facility];  Service: General;  Laterality: Left;   BREAST SURGERY     cysts removed x2   CATARACT EXTRACTION BILATERAL W/  ANTERIOR VITRECTOMY  2013   bilateral cataracts   COLONOSCOPY  2005   tics only    FOOT SURGERY     IR KYPHO LUMBAR INC FX REDUCE BONE BX UNI/BIL CANNULATION INC/IMAGING  05/28/2022   IR RADIOLOGIST EVAL & MGMT  05/21/2022   KNEE ARTHROSCOPY     left   NEUROMA SURGERY     x2 feet   thyroid duct cyst     Patient Active Problem List   Diagnosis Date Noted   Lumbar facet arthropathy 08/11/2022   Closed compression fracture of L2 lumbar vertebra, with routine healing, subsequent encounter 08/11/2022   HSV-1 (herpes simplex virus 1) infection 04/28/2022   Pain in left ankle and joints of left foot 06/18/2021   Vasomotor rhinitis 05/10/2019   Chronic allergic conjunctivitis 05/10/2019   PVC (premature ventricular contraction) 04/14/2017   Aortic atherosclerosis (HCC) 11/23/2016   Low vitamin B12 level 06/24/2016   History of breast cancer- Breast cancer of lower-inner quadrant of left female breast 11/01/2015   Stage 3b chronic kidney disease (HCC) 10/22/2015   Family history of abdominal aortic aneurysm 03/08/2015   Asthma, moderate persistent, well-controlled 04/19/2014   Hypertension 11/25/2010   Eosinophilia 02/08/2009   Allergic rhinitis 01/31/2008   Hyperlipidemia 12/20/2006   Osteoporosis 12/20/2006    PCP: Tana Conch  REFERRING PROVIDER: Clementeen Graham  REFERRING DIAG: Lumbar facet arthropathy  Rationale for Evaluation and Treatment: Rehabilitation  THERAPY DIAG:  Other low back pain  Pain in thoracic spine  ONSET DATE:   SUBJECTIVE:                                                                                                                                                                                           SUBJECTIVE STATEMENT: 02/25/2023  Pt reports soreness in R shoulder blade region intermittently, mostly with cooking. Some pain with packing the car this am, but better now.   Eval: Pt had pain in mid/low back in December, was found to have L2  compression fracture. She ended up having kyphoplasty, states minimal low back pain at this time. She has also had injections and nerve ablasion as well. 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   She states longstanding pain in R shoulder blade which is her main complaint. States most pain when she is standing and cooking in kitchen. Mild at times, and other times, up to 10/10 She is Taking 1/2 muscle relaxer at night.  Likes to lay flat on back , on floor, for comfort.   PERTINENT HISTORY: Osteoporosis, chronic kidney disease, Breast CA- radiation, HTN,  asthma (mostly non symptomatic)    PAIN:  Are you having pain? Yes: NPRS scale: 3-10 /10 Pain location: R shoulder blade/thoracic region Pain description: sore  Aggravating factors: standing, cooking, increased activity, weeding.   Relieving factors: sitting    PRECAUTIONS: None  WEIGHT BEARING RESTRICTIONS: No  FALLS:  Has patient fallen in last 6 months? No  PLOF: Independent  PATIENT GOALS: decreased pain in thoracic region.    OBJECTIVE:   DIAGNOSTIC FINDINGS:    COGNITION: Overall cognitive status: Within functional limits for tasks assessed     SENSATION:  POSTURE: No Significant postural limitations   PALPATION:   pain and muscle tension in R thoracic paraspinals. Minimal pain into R lateral ribs; mild soreness and stiffness in T-spine with light PA s.   LUMBAR ROM:   Hips ROM: WFL Shoulder ROM: WFL  AROM eval  Flexion WFL   Extension WFL  Right lateral flexion WFL  Left lateral flexion WFL  Right rotation   Left rotation    (Blank rows = not tested)   LOWER EXTREMITY MMT:    MMT Right eval Left eval  Hip flexion 4+ 4+  Hip extension    Hip abduction 4 4  Hip adduction    Hip internal rotation    Hip external rotation    Knee flexion 5 5  Knee extension 5 5  Ankle dorsiflexion    Ankle plantarflexion    Ankle inversion  Ankle eversion     (Blank rows = not tested)  LUMBAR  SPECIAL TESTS:     TODAY'S TREATMENT:                                                                                                                              DATE:   02/25/2023 Therapeutic Exercise: Aerobic: Supine:   supine SA reaches 2 x 10, Seated:  light thoracic extension in chair with towel roll at back 2 x 5;  Thoracic rot x 5 bil;  Standing:  shoulder flex/scaption x 10 with education on back posture , scap squeezes 2 x 10;  Fwd press- RTB x 10 bil/alternating, with  shoulder and back posture cuing;  Stretches:   Neuromuscular Re-education: Manual Therapy;   Mid thoracic PA mobs, light grade II, III;   STM/DTM to R thoracic paraspinals. Modalities:     Therapeutic Exercise: Aerobic: Supine:  pelvic tilts x 15;    SLR x 10 bil with TA;  Clams GTB with TA x 20;  supine SA reaches 2 x 10, Seated:  light thoracic extension in chair with towel roll at back 2 x 5;  Thoracic rot x 5 bil;  Standing:  shoulder flex/scaption x 10 with education on back posture , scap squeezes 2 x 10;  Stretches:  LTR x 15;   SKTC 30 sec x 3,   slow;    Neuromuscular Re-education: Manual Therapy;   Mid thoracic PA mobs, light grade II, III;    Modalities:     Therapeutic Exercise: Aerobic: Supine:  pelvic tilts x 15;  shoulder flexion with cane for lat stretch x 10;  bridging x 10;  SLR x 10 bil with TA;  Seated:  sit to stand x 10, education on hip hinge and back mechanics.  Standing:  QL/side bending stretch x 3 bil, 10 sec.   Rows Gtb x 15 ; shoulder flex/scaption x 10 with education on back posture :  Stretches:  LTR x 15;   SKTC 30 sec x 3 Neuromuscular Re-education: Manual Therapy;    Modalities:      Therapeutic Exercise: Aerobic: Supine:  shoulder flexion with cane for lat stretch x 10;  Seated:  shoulder protraction x 10;  Standing:  QL/side bending stretch x 3 bil, 10 sec.  Scap squeeze x 10;  Stretches:  LTR x 15;   SKTC 30 sec x 3 Neuromuscular Re-education: Manual  Therapy;   STM to R thoracic paraspinals and L lumbar paraspinals.  Modalities:   Self Care:   Therapeutic Exercise: Aerobic: Supine: shoulder protraction x 10;  Seated: Standing:  QL/side bending stretch x 3 bil, 10 sec.  Stretches:  Neuromuscular Re-education: Manual Therapy: STM to R thoracic paraspinals Modalities:  Moist heat pack x 10 min, to t-spine, seated  Self Care:   PATIENT EDUCATION:  Education details: updated and reviewed HEP Person educated: Patient Education method: Explanation, Demonstration, Tactile cues, Verbal cues, and  Handouts Education comprehension: verbalized understanding, returned demonstration, verbal cues required, tactile cues required, and needs further education  HOME EXERCISE PROGRAM: Access Code: [email or web address] previous  Access Code: [email or web address]   new    ASSESSMENT:  CLINICAL IMPRESSION: 02/25/2023   Pt with continued soreness in R thoracic region. Continued manual for t-spine mobilization and soft tissue release. Reviewed shoulder and back mechanics for UE movement, and not over extending shoulder. Pt still having variable pain and will benefit from continued care.   Eval: Patient presents with primary complaint of increased pain R thoracic spine. She has increased muscle tension and pain with palpation of paraspinals today. She also has stiffness and soreness with t-spine mobilization. She has decreased postural awareness and will benefit from posture education and strengthening. She also has lack of effective HEP for lower back pain that she has had in the past. She has decreased ability for full functional activities standing, IADLs, and cooking due to pain. Pt to benefit from skilled PT to improve deficits and pain.   OBJECTIVE IMPAIRMENTS: decreased activity tolerance, decreased mobility, decreased strength, increased muscle spasms, impaired flexibility, impaired UE functional use, and pain.   ACTIVITY LIMITATIONS: carrying, lifting, bending,  standing, squatting, stairs, dressing, reach over head, and locomotion level  PARTICIPATION LIMITATIONS: meal prep, cleaning, laundry, shopping, community activity, and yard work  PERSONAL FACTORS:  none  are also affecting patient's functional outcome.   REHAB POTENTIAL: Good  CLINICAL DECISION MAKING: Stable/uncomplicated  EVALUATION COMPLEXITY: Low   GOALS: Goals reviewed with patient? Yes   SHORT TERM GOALS: Target date: 02/23/2023  Pt to be independent with initial HEP  Goal status: INITIAL    LONG TERM GOALS: Target date: 04/06/2023   Pt to be independent with final HEP  Goal status: INITIAL  2.  Pt to report decreased pain in R thoracic region to 0-3/10 with standing activity and cooking for at least 30 min.    Goal status: INITIAL   3.  Pt to demo improved soft tissue restrictions  by at least 50% to improve pain in t-spine.   Goal status: INITIAL    PLAN:  PT FREQUENCY: 1-2x/week  PT DURATION: 8 weeks  PLANNED INTERVENTIONS: Therapeutic exercises, Therapeutic activity, Neuromuscular re-education, Balance training, Gait training, Patient/Family education, Self Care, Joint mobilization, Joint manipulation, Stair training, DME instructions, Dry Needling, Spinal manipulation, Spinal mobilization, Cryotherapy, Moist heat, Taping, Traction, Ultrasound, Ionotophoresis 4mg /ml Dexamethasone, and Manual therapy.  PLAN FOR NEXT SESSION:  education on light lumbar HEP, SKTC, start light core strength when able. STM for R thoracic paraspinals, Thoracic mobilization, Posture education and strengthening.  Cat/cow     Sedalia Muta, PT, DPT 11:43 AM  02/25/23

## 2023-03-02 ENCOUNTER — Encounter: Payer: Self-pay | Admitting: Physician Assistant

## 2023-03-02 ENCOUNTER — Ambulatory Visit (INDEPENDENT_AMBULATORY_CARE_PROVIDER_SITE_OTHER): Payer: Medicare HMO | Admitting: Physician Assistant

## 2023-03-02 VITALS — BP 132/80 | HR 65 | Temp 97.5°F | Ht 61.0 in | Wt 152.0 lb

## 2023-03-02 DIAGNOSIS — R21 Rash and other nonspecific skin eruption: Secondary | ICD-10-CM

## 2023-03-02 MED ORDER — PREDNISONE 10 MG PO TABS: ORAL_TABLET | ORAL | 0 refills | Status: DC

## 2023-03-02 MED ORDER — METHYLPREDNISOLONE ACETATE 40 MG/ML IJ SUSP
40.0000 mg | Freq: Once | INTRAMUSCULAR | Status: AC
Start: 2023-03-02 — End: 2023-03-02
  Administered 2023-03-02: 40 mg via INTRAMUSCULAR

## 2023-03-02 NOTE — Patient Instructions (Signed)
It was great to see you!  Continue daily zyrtec  Steroid shot today  Start oral prednisone tomorrow 20 mg daily x 1 week, 10 mg daily x 1 week  Please reach out if any symptom(s) worsen or fail to improve  Take care,  Jarold Motto PA-C

## 2023-03-02 NOTE — Progress Notes (Signed)
Lisa Greene is a 82 y.o. female here for a new problem.  History of Present Illness:   Chief Complaint  Patient presents with   Rash    Pt c/o rash on right arm, thinks [facility], right arm is red and blistery, OTC itch relief and cortisone cream.    Rash on Arms  She complains today of a rash on her right arm, she does believe it can be due to poison oak. The rash started about a week ago. It is mostly located on the right arm but is starting to spread to the left.  The rash is also red and itchy. She has been using an OTC itch relief and cortisone cream 4x daily to help relief her symptoms.  She is also been taking  Denies any accompanying fever, chills, puss, or discharge. Denies any history of glaucoma.    Past Medical History:  Diagnosis Date   ALLERGIC RHINITIS 01/31/2008   Asthma    BCC (basal cell carcinoma)sup& nod 03/01/2017   right nostril   Breast cancer of lower-inner quadrant of left female breast (HCC) 11/01/2015   treated with lumpectomy and radiation   Cataract    Colon cancer screening 05/21/2014   No polyps at age 7. Diverticulosis alone-no more colonoscopies.     DIVERTICULITIS, HX OF 09/07/2008   pt denies this    Eosinophilia 02/08/2009   Fibroid    HYPERLIPIDEMIA 12/20/2006   Hypertension    OSTEOPOROSIS 12/20/2006   PVC's (premature ventricular contractions)    SCC (squamous cell carcinoma) well diff 11/28/2013   left side chin   Vitamin B 12 deficiency      Social History   Tobacco Use   Smoking status: Never   Smokeless tobacco: Never  Vaping Use   Vaping status: Never Used  Substance Use Topics   Alcohol use: No    Alcohol/week: 0.0 standard drinks of alcohol   Drug use: No    Past Surgical History:  Procedure Laterality Date   BREAST LUMPECTOMY WITH NEEDLE LOCALIZATION Left 01/03/2016   Procedure: BREAST re-excision LUMPECTOMY WITH NEEDLE LOCALIZATION;  Surgeon: Emelia Loron, MD;  Location: [facility];  Service:  General;  Laterality: Left;  BREAST re-excision LUMPECTOMY WITH NEEDLE LOCALIZATION   BREAST LUMPECTOMY WITH RADIOACTIVE SEED LOCALIZATION Left 11/28/2015   Procedure: LEFT BREAST LUMPECTOMY WITH BRACKETED RADIOACTIVE SEED LOCALIZATION;  Surgeon: Emelia Loron, MD;  Location: [facility];  Service: General;  Laterality: Left;   BREAST SURGERY     cysts removed x2   CATARACT EXTRACTION BILATERAL W/ ANTERIOR VITRECTOMY  2013   bilateral cataracts   COLONOSCOPY  2005   tics only    FOOT SURGERY     IR KYPHO LUMBAR INC FX REDUCE BONE BX UNI/BIL CANNULATION INC/IMAGING  05/28/2022   IR RADIOLOGIST EVAL & MGMT  05/21/2022   KNEE ARTHROSCOPY     left   NEUROMA SURGERY     x2 feet   thyroid duct cyst      Family History  Problem Relation Age of Onset   Brain cancer Mother    COPD Father    Heart disease Father        stent placed 81   Allergic rhinitis Father    COPD Brother        died of flu/double pna age 27   Healthy Brother        lives local- sees Dr. Durene Cal   Arthritis Brother  back issues   Colon cancer Neg Hx    Stomach cancer Neg Hx     Allergies  Allergen Reactions   Lisinopril Other (See Comments) and Cough    Dry cough and stinging rash   Latex Rash   Penicillins Rash    Has patient had a PCN reaction causing immediate rash, facial/tongue/throat swelling, SOB or lightheadedness with hypotension: Yes Has patient had a PCN reaction causing severe rash involving mucus membranes or skin necrosis: No Has patient had a PCN reaction that required hospitalization No Has patient had a PCN reaction occurring within the last 10 years: No If all of the above answers are "NO", then may proceed with Cephalosporin use.    Sulfonamide Derivatives Rash   Tape Rash    Band-aids break out skin!!    Current Medications:   Current Outpatient Medications:    Albuterol Sulfate (PROAIR RESPICLICK) 108 (90 Base) MCG/ACT AEPB, Inhale 1 puff into the lungs  every 8 (eight) hours as needed (wheezing/shortness of breath)., Disp: , Rfl:    atorvastatin (LIPITOR) 20 MG tablet, Take 1 tablet (20 mg total) by mouth daily., Disp: 90 tablet, Rfl: 3   azelastine (OPTIVAR) 0.05 % ophthalmic solution, , Disp: , Rfl:    BREO ELLIPTA 200-25 MCG/INH AEPB, Inhale 1 puff into the lungs daily., Disp: , Rfl:    cetirizine (ZYRTEC) 10 MG tablet, Take 10 mg by mouth daily., Disp: , Rfl:    cholecalciferol (VITAMIN D) 1000 UNITS tablet, Take 2,000 Units by mouth daily., Disp: , Rfl:    Cyanocobalamin (VITAMIN B 12 PO), Take 1,000 mg by mouth every other day., Disp: , Rfl:    denosumab (PROLIA) 60 MG/ML SOSY injection, INJECT 1 SYRINGE UNDER THE SKIN ONCE EVERY 6 MONTHS., Disp: 1 mL, Rfl: 0   fluticasone (FLONASE) 50 MCG/ACT nasal spray, Place 1 spray into both nostrils daily as needed. , Disp: , Rfl:    hydrochlorothiazide (MICROZIDE) 12.5 MG capsule, Take 1 capsule (12.5 mg total) by mouth every Monday, Wednesday, and Friday., Disp: 45 capsule, Rfl: 2   irbesartan (AVAPRO) 150 MG tablet, TAKE ONE TABLET BY MOUTH DAILY, Disp: 90 tablet, Rfl: 3   metoprolol succinate (TOPROL-XL) 50 MG 24 hr tablet, Take 1 tablet (50 mg total) by mouth daily., Disp: 90 tablet, Rfl: 3   montelukast (SINGULAIR) 10 MG tablet, Take 1 tablet by mouth at bedtime., Disp: , Rfl:    predniSONE (DELTASONE) 10 MG tablet, Take 20 mg daily x 1 week, then 10 mg daily x 1 week., Disp: 21 tablet, Rfl: 0   tiZANidine (ZANAFLEX) 2 MG tablet, Take 1 tablet (2 mg total) by mouth at bedtime. (Patient not taking: Reported on 03/02/2023), Disp: 30 tablet, Rfl: 0   Review of Systems:   Review of Systems  Constitutional:  Negative for chills and fever.  Skin:  Positive for itching and rash.       -puss -discharge    Vitals:   Vitals:   03/02/23 1120  BP: 132/80  Pulse: 65  Temp: (!) 97.5 F (36.4 C)  TempSrc: Temporal  SpO2: 97%  Weight: 152 lb (68.9 kg)  Height: 5\' 1"  (1.549 m)     Body mass  index is 28.72 kg/m.  Physical Exam:   Physical Exam Constitutional:      Appearance: Normal appearance. She is well-developed.  HENT:     Head: Normocephalic and atraumatic.  Eyes:     General: Lids are normal.     Extraocular  Movements: Extraocular movements intact.     Conjunctiva/sclera: Conjunctivae normal.  Pulmonary:     Effort: Pulmonary effort is normal.  Musculoskeletal:        General: Normal range of motion.     Cervical back: Normal range of motion and neck supple.  Skin:    General: Skin is warm and dry.     Comments: Numerous fluid-filled vesicles to R inner distal arm; no active drainage  Neurological:     Mental Status: She is alert and oriented to person, place, and time.  Psychiatric:        Attention and Perception: Attention and perception normal.        Mood and Affect: Mood normal.        Behavior: Behavior normal.        Thought Content: Thought content normal.        Judgment: Judgment normal.     Assessment and Plan:   Rash No red flags Suspect contact dermatitis from poison oak Depomedrol injection provided today Recommend start oral prednisone tomorrow Continue daily antihistamine Follow-up if any concerns or signs of infection - no indication for antibiotic(s) on  my exam   Jarold Motto, PA-C  I,Safa M Kadhim,acting as a scribe for Jarold Motto, PA.,have documented all relevant documentation on the behalf of Jarold Motto, PA,as directed by  Jarold Motto, PA while in the presence of Jarold Motto, [place].   I, Jarold Motto, [place], have reviewed all documentation for this visit. The documentation on 03/02/23 for the exam, diagnosis, procedures, and orders are all accurate and complete.

## 2023-03-03 ENCOUNTER — Telehealth: Payer: Self-pay

## 2023-03-03 ENCOUNTER — Telehealth: Payer: Self-pay | Admitting: Family Medicine

## 2023-03-03 ENCOUNTER — Other Ambulatory Visit (HOSPITAL_COMMUNITY): Payer: Self-pay

## 2023-03-03 ENCOUNTER — Encounter: Payer: Medicare HMO | Admitting: Physical Therapy

## 2023-03-03 NOTE — Telephone Encounter (Signed)
Ok to schedule Prolia

## 2023-03-03 NOTE — Telephone Encounter (Signed)
Pt received Prolia 6 months ago and needs new new Prolia Authorization.

## 2023-03-03 NOTE — Telephone Encounter (Signed)
Please see messages below and advise. 

## 2023-03-03 NOTE — Telephone Encounter (Signed)
Pt ready for scheduling for Prolia on or after : 03/11/23  Out-of-pocket cost due at time of visit: ~$327  Primary: [employer] - Medicare Prolia co-insurance: 20% Admin fee co-insurance: 20%  Secondary: N/A Prolia co-insurance:  Admin fee co-insurance:   Medical Benefit Details: Date Benefits were checked: 03/03/23 Deductible: no/ Coinsurance: 20%/ Admin Fee: 20%  Prior Auth: Approved PA# [id number] Expiration Date: 07/28/2023  # of doses approved:  Pharmacy benefit: Copay $$[id number] If patient wants fill through the pharmacy benefit please send prescription to: [employer], and include estimated need by date in rx notes. Pharmacy will ship medication directly to the office.  Patient not eligible for Prolia Copay Card. Copay Card can make patient's cost as little as $25. Link to apply: [email or web address]://[email or web address]  ** This summary of benefits is an estimation of the patient's out-of-pocket cost. Exact cost may very based on individual plan coverage.

## 2023-03-03 NOTE — Telephone Encounter (Signed)
I believe that the Rx Prior Auth team handles Prolia auths for [place].

## 2023-03-04 NOTE — Telephone Encounter (Signed)
Patient returned call and has been scheduled for 10/15 @ 10:15 am . Patient was informed of OOP cost of $327. Pt stated this should be $120 including administration fee. I advised patient to call insurance to see if something changed in terms of the price and pt verbalized understanding. Patient wanted to continue with schedule visit.

## 2023-03-04 NOTE — Telephone Encounter (Signed)
LVM informing pt of upcoming prolia injection needed as well as copay due at the time of visit.

## 2023-03-05 ENCOUNTER — Encounter: Payer: Medicare HMO | Admitting: Physical Therapy

## 2023-03-08 ENCOUNTER — Ambulatory Visit: Payer: Medicare HMO | Admitting: Physical Therapy

## 2023-03-08 ENCOUNTER — Encounter: Payer: Self-pay | Admitting: Physical Therapy

## 2023-03-08 DIAGNOSIS — M5459 Other low back pain: Secondary | ICD-10-CM

## 2023-03-08 DIAGNOSIS — M546 Pain in thoracic spine: Secondary | ICD-10-CM | POA: Diagnosis not present

## 2023-03-08 NOTE — Therapy (Signed)
OUTPATIENT PHYSICAL THERAPY THORACOLUMBAR TREATMENT    Patient Name: Lisa Greene MRN: [id number] DOB:1941/04/09, 82 y.o., female Today's Date: 03/08/2023  END OF SESSION:  PT End of Session - 03/15/23 1415     Visit Number 6    Number of Visits 16    Date for PT Re-Evaluation 04/06/23    Authorization Type [employer] Medicare    PT Start Time 1306    PT Stop Time 1345    PT Time Calculation (min) 39 min    Activity Tolerance Patient tolerated treatment well    Behavior During Therapy WFL for tasks assessed/performed                Past Medical History:  Diagnosis Date   ALLERGIC RHINITIS 01/31/2008   Asthma    BCC (basal cell carcinoma)sup& nod 03/01/2017   right nostril   Breast cancer of lower-inner quadrant of left female breast (HCC) 11/01/2015   treated with lumpectomy and radiation   Cataract    Colon cancer screening 05/21/2014   No polyps at age 55. Diverticulosis alone-no more colonoscopies.     DIVERTICULITIS, HX OF 09/07/2008   pt denies this    Eosinophilia 02/08/2009   Fibroid    HYPERLIPIDEMIA 12/20/2006   Hypertension    OSTEOPOROSIS 12/20/2006   PVC's (premature ventricular contractions)    SCC (squamous cell carcinoma) well diff 11/28/2013   left side chin   Vitamin B 12 deficiency    Past Surgical History:  Procedure Laterality Date   BREAST LUMPECTOMY WITH NEEDLE LOCALIZATION Left 01/03/2016   Procedure: BREAST re-excision LUMPECTOMY WITH NEEDLE LOCALIZATION;  Surgeon: Emelia Loron, MD;  Location: [facility];  Service: General;  Laterality: Left;  BREAST re-excision LUMPECTOMY WITH NEEDLE LOCALIZATION   BREAST LUMPECTOMY WITH RADIOACTIVE SEED LOCALIZATION Left 11/28/2015   Procedure: LEFT BREAST LUMPECTOMY WITH BRACKETED RADIOACTIVE SEED LOCALIZATION;  Surgeon: Emelia Loron, MD;  Location: [facility];  Service: General;  Laterality: Left;   BREAST SURGERY     cysts removed x2   CATARACT EXTRACTION BILATERAL W/  ANTERIOR VITRECTOMY  2013   bilateral cataracts   COLONOSCOPY  2005   tics only    FOOT SURGERY     IR KYPHO LUMBAR INC FX REDUCE BONE BX UNI/BIL CANNULATION INC/IMAGING  05/28/2022   IR RADIOLOGIST EVAL & MGMT  05/21/2022   KNEE ARTHROSCOPY     left   NEUROMA SURGERY     x2 feet   thyroid duct cyst     Patient Active Problem List   Diagnosis Date Noted   Lumbar facet arthropathy 08/11/2022   Closed compression fracture of L2 lumbar vertebra, with routine healing, subsequent encounter 08/11/2022   HSV-1 (herpes simplex virus 1) infection 04/28/2022   Pain in left ankle and joints of left foot 06/18/2021   Vasomotor rhinitis 05/10/2019   Chronic allergic conjunctivitis 05/10/2019   PVC (premature ventricular contraction) 04/14/2017   Aortic atherosclerosis (HCC) 11/23/2016   Low vitamin B12 level 06/24/2016   History of breast cancer- Breast cancer of lower-inner quadrant of left female breast 11/01/2015   Stage 3b chronic kidney disease (HCC) 10/22/2015   Family history of abdominal aortic aneurysm 03/08/2015   Asthma, moderate persistent, well-controlled 04/19/2014   Hypertension 11/25/2010   Eosinophilia 02/08/2009   Allergic rhinitis 01/31/2008   Hyperlipidemia 12/20/2006   Osteoporosis 12/20/2006    PCP: Tana Conch  REFERRING PROVIDER: Clementeen Graham  REFERRING DIAG: Lumbar facet arthropathy  Rationale for Evaluation and Treatment:  Rehabilitation  THERAPY DIAG:  Other low back pain  Pain in thoracic spine  ONSET DATE:   SUBJECTIVE:                                                                                                                                                                                           SUBJECTIVE STATEMENT:  03/08/2023  : Pt reports soreness in R shoulder blade region intermittently, mostly with cooking. Some pain with packing the car this am, but better now.    Eval: Pt had pain in mid/low back in December, was found to have L2  compression fracture. She ended up having kyphoplasty, states minimal low back pain at this time. She has also had injections and nerve ablasion as well. 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   She states longstanding pain in R shoulder blade which is her main complaint. States most pain when she is standing and cooking in kitchen. Mild at times, and other times, up to 10/10 She is Taking 1/2 muscle relaxer at night.  Likes to lay flat on back , on floor, for comfort.   PERTINENT HISTORY: Osteoporosis, chronic kidney disease, Breast CA- radiation, HTN,  asthma (mostly non symptomatic)    PAIN:  Are you having pain? Yes: NPRS scale: 0-5/10 Pain location: R shoulder blade/thoracic region Pain description: sore  Aggravating factors: standing, cooking, increased activity, weeding.   Relieving factors: sitting    PRECAUTIONS: None  WEIGHT BEARING RESTRICTIONS: No  FALLS:  Has patient fallen in last 6 months? No  PLOF: Independent  PATIENT GOALS: decreased pain in thoracic region.    OBJECTIVE:   DIAGNOSTIC FINDINGS:    COGNITION: Overall cognitive status: Within functional limits for tasks assessed     SENSATION:  POSTURE: No Significant postural limitations   PALPATION:     LUMBAR ROM:   Hips ROM: WFL Shoulder ROM: WFL  AROM eval  Flexion WFL   Extension WFL  Right lateral flexion WFL  Left lateral flexion WFL  Right rotation   Left rotation    (Blank rows = not tested)   LOWER EXTREMITY MMT:    MMT Right eval Left eval  Hip flexion 4+ 4+  Hip extension    Hip abduction 4 4  Hip adduction    Hip internal rotation    Hip external rotation    Knee flexion 5 5  Knee extension 5 5  Ankle dorsiflexion    Ankle plantarflexion    Ankle inversion    Ankle eversion     (Blank rows = not tested)  LUMBAR SPECIAL TESTS:  TODAY'S TREATMENT:                                                                                                                               DATE:   03/08/23: Therapeutic Exercise: Aerobic: Supine:   supine SA reaches 2 x 10;  shoulder flexion x 10 AROM;  Seated:  light thoracic extension in chair with towel roll at back 2 x 5;  Thoracic rot x 5 bil;  Standing:  shoulder flex/scaption x 10 with education on back posture , scap squeezes 2 x 10;  Stretches:  LTR x 15;  SKTC 30 sec x 3 bil;  Neuromuscular Re-education: Manual Therapy;   Modalities:    Previous;   Therapeutic Exercise: Aerobic: Supine:  pelvic tilts x 15;    SLR x 10 bil with TA;  Clams GTB with TA x 20;  supine SA reaches 2 x 10, Seated:  light thoracic extension in chair with towel roll at back 2 x 5;  Thoracic rot x 5 bil;  Standing:  shoulder flex/scaption x 10 with education on back posture , scap squeezes 2 x 10;  Stretches:  LTR x 15;   SKTC 30 sec x 3,   slow;    Neuromuscular Re-education: Manual Therapy;   Mid thoracic PA mobs, light grade II, III;    Modalities:       PATIENT EDUCATION:  Education details: updated and reviewed HEP Person educated: Patient Education method: Explanation, Demonstration, Tactile cues, Verbal cues, and Handouts Education comprehension: verbalized understanding, returned demonstration, verbal cues required, tactile cues required, and needs further education  HOME EXERCISE PROGRAM: Access Code: [email or web address]  Access Code: [email or web address]   new    ASSESSMENT:  CLINICAL IMPRESSION:  03/08/2023   Pt with improving soreness, pain less often, and is doing well with management. She has no pain with session today. She does require cuing for optimal mechanics with exercises. Reviewed HEP. Pt to benefit from continued care.    Eval: Patient presents with primary complaint of increased pain R thoracic spine. She has increased muscle tension and pain with palpation of paraspinals today. She also has stiffness and soreness with t-spine mobilization. She has decreased postural awareness and will  benefit from posture education and strengthening. She also has lack of effective HEP for lower back pain that she has had in the past. She has decreased ability for full functional activities standing, IADLs, and cooking due to pain. Pt to benefit from skilled PT to improve deficits and pain.   OBJECTIVE IMPAIRMENTS: decreased activity tolerance, decreased mobility, decreased strength, increased muscle spasms, impaired flexibility, impaired UE functional use, and pain.   ACTIVITY LIMITATIONS: carrying, lifting, bending, standing, squatting, stairs, dressing, reach over head, and locomotion level  PARTICIPATION LIMITATIONS: meal prep, cleaning, laundry, shopping, community activity, and yard work  PERSONAL FACTORS:  none  are also affecting patient's functional outcome.   REHAB POTENTIAL: Good  CLINICAL DECISION MAKING: Stable/uncomplicated  EVALUATION COMPLEXITY: Low   GOALS: Goals reviewed with patient? Yes   SHORT TERM GOALS: Target date: 02/23/2023  Pt to be independent with initial HEP  Goal status: MET    LONG TERM GOALS: Target date: 04/06/2023   Pt to be independent with final HEP  Goal status: INITIAL  2.  Pt to report decreased pain in R thoracic region to 0-3/10 with standing activity and cooking for at least 30 min.    Goal status: INITIAL   3.  Pt to demo improved soft tissue restrictions  by at least 50% to improve pain in t-spine.   Goal status: INITIAL    PLAN:  PT FREQUENCY: 1-2x/week  PT DURATION: 8 weeks  PLANNED INTERVENTIONS: Therapeutic exercises, Therapeutic activity, Neuromuscular re-education, Balance training, Gait training, Patient/Family education, Self Care, Joint mobilization, Joint manipulation, Stair training, DME instructions, Dry Needling, Spinal manipulation, Spinal mobilization, Cryotherapy, Moist heat, Taping, Traction, Ultrasound, Ionotophoresis 4mg /ml Dexamethasone, and Manual therapy.  PLAN FOR NEXT SESSION:  education on  light lumbar HEP, SKTC, start light core strength when able. STM for R thoracic paraspinals, Thoracic mobilization, Posture education and strengthening.  Cat/cow     Sedalia Muta, PT, DPT 2:16 PM  03/15/23

## 2023-03-10 ENCOUNTER — Encounter: Payer: Medicare HMO | Admitting: Physical Therapy

## 2023-03-11 ENCOUNTER — Encounter: Payer: Medicare HMO | Admitting: Physical Therapy

## 2023-03-11 DIAGNOSIS — Z961 Presence of intraocular lens: Secondary | ICD-10-CM | POA: Diagnosis not present

## 2023-03-11 DIAGNOSIS — H52203 Unspecified astigmatism, bilateral: Secondary | ICD-10-CM | POA: Diagnosis not present

## 2023-03-12 ENCOUNTER — Ambulatory Visit (INDEPENDENT_AMBULATORY_CARE_PROVIDER_SITE_OTHER): Payer: Medicare HMO | Admitting: Family Medicine

## 2023-03-12 ENCOUNTER — Encounter: Payer: Self-pay | Admitting: Family Medicine

## 2023-03-12 VITALS — BP 120/60 | HR 63 | Temp 97.5°F | Resp 18 | Ht 61.0 in | Wt 153.5 lb

## 2023-03-12 DIAGNOSIS — L237 Allergic contact dermatitis due to plants, except food: Secondary | ICD-10-CM | POA: Diagnosis not present

## 2023-03-12 NOTE — Progress Notes (Signed)
Subjective:     Patient ID: Lisa Greene, female    DOB: Oct 17, 1940, 82 y.o.   MRN: [id number]  Chief Complaint  Patient presents with   [facility]    Not completely gone, still have on right arm and ankle    HPI  Poison oak - Complains of a rash from poison oak on her right arm and left ankle. She was previously seen by Rinaldo Cloud for this on 10/1 and was advised to continue zyrtec, was given a steroid injection, and started on prednisone 20 mg for 1 week and 10 mg for 1 week. States the rash has improved but still there. Has tried using calamine lotion and topical cortizone cream to relieve her symptoms-helps.   Had her vision exam yesterday, "eye pressure" was normal per Pt so not exacerbated by pred..    There are no preventive care reminders to display for this patient.  Past Medical History:  Diagnosis Date   ALLERGIC RHINITIS 01/31/2008   Asthma    BCC (basal cell carcinoma)sup& nod 03/01/2017   right nostril   Breast cancer of lower-inner quadrant of left female breast (HCC) 11/01/2015   treated with lumpectomy and radiation   Cataract    Colon cancer screening 05/21/2014   No polyps at age 89. Diverticulosis alone-no more colonoscopies.     DIVERTICULITIS, HX OF 09/07/2008   pt denies this    Eosinophilia 02/08/2009   Fibroid    HYPERLIPIDEMIA 12/20/2006   Hypertension    OSTEOPOROSIS 12/20/2006   PVC's (premature ventricular contractions)    SCC (squamous cell carcinoma) well diff 11/28/2013   left side chin   Vitamin B 12 deficiency     Past Surgical History:  Procedure Laterality Date   BREAST LUMPECTOMY WITH NEEDLE LOCALIZATION Left 01/03/2016   Procedure: BREAST re-excision LUMPECTOMY WITH NEEDLE LOCALIZATION;  Surgeon: Emelia Loron, MD;  Location: [facility];  Service: General;  Laterality: Left;  BREAST re-excision LUMPECTOMY WITH NEEDLE LOCALIZATION   BREAST LUMPECTOMY WITH RADIOACTIVE SEED LOCALIZATION Left 11/28/2015   Procedure: LEFT  BREAST LUMPECTOMY WITH BRACKETED RADIOACTIVE SEED LOCALIZATION;  Surgeon: Emelia Loron, MD;  Location: [facility];  Service: General;  Laterality: Left;   BREAST SURGERY     cysts removed x2   CATARACT EXTRACTION BILATERAL W/ ANTERIOR VITRECTOMY  2013   bilateral cataracts   COLONOSCOPY  2005   tics only    FOOT SURGERY     IR KYPHO LUMBAR INC FX REDUCE BONE BX UNI/BIL CANNULATION INC/IMAGING  05/28/2022   IR RADIOLOGIST EVAL & MGMT  05/21/2022   KNEE ARTHROSCOPY     left   NEUROMA SURGERY     x2 feet   thyroid duct cyst       Current Outpatient Medications:    Albuterol Sulfate (PROAIR RESPICLICK) 108 (90 Base) MCG/ACT AEPB, Inhale 1 puff into the lungs every 8 (eight) hours as needed (wheezing/shortness of breath)., Disp: , Rfl:    atorvastatin (LIPITOR) 20 MG tablet, Take 1 tablet (20 mg total) by mouth daily., Disp: 90 tablet, Rfl: 3   azelastine (OPTIVAR) 0.05 % ophthalmic solution, , Disp: , Rfl:    BREO ELLIPTA 200-25 MCG/INH AEPB, Inhale 1 puff into the lungs daily., Disp: , Rfl:    cetirizine (ZYRTEC) 10 MG tablet, Take 10 mg by mouth daily., Disp: , Rfl:    cholecalciferol (VITAMIN D) 1000 UNITS tablet, Take 2,000 Units by mouth daily., Disp: , Rfl:    Cyanocobalamin (  VITAMIN B 12 PO), Take 1,000 mg by mouth every other day., Disp: , Rfl:    denosumab (PROLIA) 60 MG/ML SOSY injection, INJECT 1 SYRINGE UNDER THE SKIN ONCE EVERY 6 MONTHS., Disp: 1 mL, Rfl: 0   fluticasone (FLONASE) 50 MCG/ACT nasal spray, Place 1 spray into both nostrils daily as needed. , Disp: , Rfl:    hydrochlorothiazide (MICROZIDE) 12.5 MG capsule, Take 1 capsule (12.5 mg total) by mouth every Monday, Wednesday, and Friday., Disp: 45 capsule, Rfl: 2   irbesartan (AVAPRO) 150 MG tablet, TAKE ONE TABLET BY MOUTH DAILY, Disp: 90 tablet, Rfl: 3   metoprolol succinate (TOPROL-XL) 50 MG 24 hr tablet, Take 1 tablet (50 mg total) by mouth daily., Disp: 90 tablet, Rfl: 3   montelukast  (SINGULAIR) 10 MG tablet, Take 1 tablet by mouth at bedtime., Disp: , Rfl:    predniSONE (DELTASONE) 10 MG tablet, Take 20 mg daily x 1 week, then 10 mg daily x 1 week., Disp: 21 tablet, Rfl: 0   tiZANidine (ZANAFLEX) 2 MG tablet, Take 1 tablet (2 mg total) by mouth at bedtime., Disp: 30 tablet, Rfl: 0  Allergies  Allergen Reactions   Lisinopril Other (See Comments) and Cough    Dry cough and stinging rash   Latex Rash   Penicillins Rash    Has patient had a PCN reaction causing immediate rash, facial/tongue/throat swelling, SOB or lightheadedness with hypotension: Yes Has patient had a PCN reaction causing severe rash involving mucus membranes or skin necrosis: No Has patient had a PCN reaction that required hospitalization No Has patient had a PCN reaction occurring within the last 10 years: No If all of the above answers are "NO", then may proceed with Cephalosporin use.    Sulfonamide Derivatives Rash   Tape Rash    Band-aids break out skin!!   ROS neg/noncontributory except as noted HPI/below      Objective:     BP 120/60   Pulse 63   Temp (!) 97.5 F (36.4 C) (Temporal)   Resp 18   Ht 5\' 1"  (1.549 m)   Wt 153 lb 8 oz (69.6 kg)   SpO2 98%   BMI 29.00 kg/m  Wt Readings from Last 3 Encounters:  03/12/23 153 lb 8 oz (69.6 kg)  03/02/23 152 lb (68.9 kg)  01/22/23 150 lb 3.2 oz (68.1 kg)    Physical Exam   Gen: WDWN NAD HEENT: NCAT, conjunctiva not injected, sclera nonicteric EXT:  no edema MSK: no gross abnormalities.  NEURO: A&O x3.  CN II-XII intact.  PSYCH: normal mood. Good eye contact SKIN: Healed poison ivy on anterior right wrist. A couple spots on her right forearm. A couple areas on left ankle. All dried      Assessment & Plan:  Poison ivy dermatitis  Poison ivy-resolving.  Not concerning.  Continue current tx.   Return if symptoms worsen or fail to improve.    I,Rachel Rivera,acting as a scribe for Angelena Sole, MD.,have documented all  relevant documentation on the behalf of Angelena Sole, MD,as directed by  Angelena Sole, MD while in the presence of Angelena Sole, MD.  I, Angelena Sole, MD, have reviewed all documentation for this visit. The documentation on 03/13/23 for the exam, diagnosis, procedures, and orders are all accurate and complete.    Angelena Sole, MD

## 2023-03-12 NOTE — Patient Instructions (Signed)

## 2023-03-15 ENCOUNTER — Ambulatory Visit: Payer: Medicare HMO | Admitting: Physical Therapy

## 2023-03-15 ENCOUNTER — Encounter: Payer: Self-pay | Admitting: Physical Therapy

## 2023-03-15 DIAGNOSIS — M546 Pain in thoracic spine: Secondary | ICD-10-CM

## 2023-03-15 DIAGNOSIS — M5459 Other low back pain: Secondary | ICD-10-CM

## 2023-03-15 NOTE — Therapy (Signed)
OUTPATIENT PHYSICAL THERAPY THORACOLUMBAR TREATMENT    Patient Name: Lisa Greene MRN: [id number] DOB:12/13/1940, 82 y.o., female Today's Date: 10/14 /2024  END OF SESSION:  PT End of Session - 03/15/23 1421     Visit Number 7    Number of Visits 16    Date for PT Re-Evaluation 04/06/23    Authorization Type [employer]    PT Start Time 1303    PT Stop Time 1342    PT Time Calculation (min) 39 min    Activity Tolerance Patient tolerated treatment well    Behavior During Therapy [facility] for tasks assessed/performed                Past Medical History:  Diagnosis Date   ALLERGIC RHINITIS 01/31/2008   Asthma    BCC (basal cell carcinoma)sup& nod 03/01/2017   right nostril   Breast cancer of lower-inner quadrant of left female breast (HCC) 11/01/2015   treated with lumpectomy and radiation   Cataract    Colon cancer screening 05/21/2014   No polyps at age 22. Diverticulosis alone-no more colonoscopies.     DIVERTICULITIS, HX OF 09/07/2008   pt denies this    Eosinophilia 02/08/2009   Fibroid    HYPERLIPIDEMIA 12/20/2006   Hypertension    OSTEOPOROSIS 12/20/2006   PVC's (premature ventricular contractions)    SCC (squamous cell carcinoma) well diff 11/28/2013   left side chin   Vitamin B 12 deficiency    Past Surgical History:  Procedure Laterality Date   BREAST LUMPECTOMY WITH NEEDLE LOCALIZATION Left 01/03/2016   Procedure: BREAST re-excision LUMPECTOMY WITH NEEDLE LOCALIZATION;  Surgeon: Emelia Loron, MD;  Location: [facility];  Service: General;  Laterality: Left;  BREAST re-excision LUMPECTOMY WITH NEEDLE LOCALIZATION   BREAST LUMPECTOMY WITH RADIOACTIVE SEED LOCALIZATION Left 11/28/2015   Procedure: LEFT BREAST LUMPECTOMY WITH BRACKETED RADIOACTIVE SEED LOCALIZATION;  Surgeon: Emelia Loron, MD;  Location: [facility];  Service: General;  Laterality: Left;   BREAST SURGERY     cysts removed x2   CATARACT EXTRACTION BILATERAL  W/ ANTERIOR VITRECTOMY  2013   bilateral cataracts   COLONOSCOPY  2005   tics only    FOOT SURGERY     IR KYPHO LUMBAR INC FX REDUCE BONE BX UNI/BIL CANNULATION INC/IMAGING  05/28/2022   IR RADIOLOGIST EVAL & MGMT  05/21/2022   KNEE ARTHROSCOPY     left   NEUROMA SURGERY     x2 feet   thyroid duct cyst     Patient Active Problem List   Diagnosis Date Noted   Lumbar facet arthropathy 08/11/2022   Closed compression fracture of L2 lumbar vertebra, with routine healing, subsequent encounter 08/11/2022   HSV-1 (herpes simplex virus 1) infection 04/28/2022   Pain in left ankle and joints of left foot 06/18/2021   Vasomotor rhinitis 05/10/2019   Chronic allergic conjunctivitis 05/10/2019   PVC (premature ventricular contraction) 04/14/2017   Aortic atherosclerosis (HCC) 11/23/2016   Low vitamin B12 level 06/24/2016   History of breast cancer- Breast cancer of lower-inner quadrant of left female breast 11/01/2015   Stage 3b chronic kidney disease (HCC) 10/22/2015   Family history of abdominal aortic aneurysm 03/08/2015   Asthma, moderate persistent, well-controlled 04/19/2014   Hypertension 11/25/2010   Eosinophilia 02/08/2009   Allergic rhinitis 01/31/2008   Hyperlipidemia 12/20/2006   Osteoporosis 12/20/2006    PCP: Tana Conch  REFERRING PROVIDER: Clementeen Graham  REFERRING DIAG: Lumbar facet arthropathy  Rationale for Evaluation and  Treatment: Rehabilitation  THERAPY DIAG:  Other low back pain  Pain in thoracic spine  ONSET DATE:   SUBJECTIVE:                                                                                                                                                                                           SUBJECTIVE STATEMENT:  03/15/2023  : Pt reports mild soreness in R shoulder blade region at times. Has been less overall, intermittent, and has been able to get it to subside with moving/stretches. Thinks she is doing better.   Eval: Pt had  pain in mid/low back in December, was found to have L2 compression fracture. She ended up having kyphoplasty, states minimal low back pain at this time. She has also had injections and nerve ablasion as well. 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   She states longstanding pain in R shoulder blade which is her main complaint. States most pain when she is standing and cooking in kitchen. Mild at times, and other times, up to 10/10 She is Taking 1/2 muscle relaxer at night.  Likes to lay flat on back , on floor, for comfort.   PERTINENT HISTORY: Osteoporosis, chronic kidney disease, Breast CA- radiation, HTN,  asthma (mostly non symptomatic)    PAIN:  Are you having pain? Yes: NPRS scale: 0-5/10 Pain location: R shoulder blade/thoracic region Pain description: sore  Aggravating factors: standing, cooking, increased activity, weeding.   Relieving factors: sitting    PRECAUTIONS: None  WEIGHT BEARING RESTRICTIONS: No  FALLS:  Has patient fallen in last 6 months? No  PLOF: Independent  PATIENT GOALS: decreased pain in thoracic region.    OBJECTIVE:   DIAGNOSTIC FINDINGS:    COGNITION: Overall cognitive status: Within functional limits for tasks assessed     SENSATION:  POSTURE: No Significant postural limitations   PALPATION:     LUMBAR ROM:   Hips ROM: WFL Shoulder ROM: WFL  AROM eval  Flexion WFL   Extension WFL  Right lateral flexion WFL  Left lateral flexion WFL  Right rotation   Left rotation    (Blank rows = not tested)   LOWER EXTREMITY MMT:    MMT Right eval Left eval  Hip flexion 4+ 4+  Hip extension    Hip abduction 4 4  Hip adduction    Hip internal rotation    Hip external rotation    Knee flexion 5 5  Knee extension 5 5  Ankle dorsiflexion    Ankle plantarflexion    Ankle inversion    Ankle eversion     (Blank rows = not  tested)  LUMBAR SPECIAL TESTS:     TODAY'S TREATMENT:                                                                                                                               DATE:   03/15/23: Therapeutic Exercise: Aerobic: Supine:   supine SA reaches 2 x 10;   Seated:  shoulder rolls x 10, review of optimal seated posture.  Standing:   scap squeezes 2 x 10;  Rows x 15 with GTB ; Review of self MFR for R rhomboid region with tennis ball at wall.  Stretches:  LTR x 15;   SKTC 30 sec x 3 bil;  Neuromuscular Re-education: Manual Therapy;   Modalities:    03/08/23: Therapeutic Exercise: Aerobic: Supine:   supine SA reaches 2 x 10;  shoulder flexion x 10 AROM;  Seated:  light thoracic extension in chair with towel roll at back 2 x 5;  Thoracic rot x 5 bil;  Standing:  shoulder flex/scaption x 10 with education on back posture , scap squeezes 2 x 10;  Stretches:  LTR x 15;  SKTC 30 sec x 3 bil;  Neuromuscular Re-education: Manual Therapy;   Modalities:    Previous;   Therapeutic Exercise: Aerobic: Supine:  pelvic tilts x 15;    SLR x 10 bil with TA;  Clams GTB with TA x 20;  supine SA reaches 2 x 10, Seated:  light thoracic extension in chair with towel roll at back 2 x 5;  Thoracic rot x 5 bil;  Standing:  shoulder flex/scaption x 10 with education on back posture , scap squeezes 2 x 10;  Stretches:  LTR x 15;   SKTC 30 sec x 3,   slow;    Neuromuscular Re-education: Manual Therapy;   Mid thoracic PA mobs, light grade II, III;    Modalities:       PATIENT EDUCATION:  Education details: updated and reviewed HEP Person educated: Patient Education method: Explanation, Demonstration, Tactile cues, Verbal cues, and Handouts Education comprehension: verbalized understanding, returned demonstration, verbal cues required, tactile cues required, and needs further education  HOME EXERCISE PROGRAM: Access Code: [email or web address]  Access Code: [email or web address]   new    ASSESSMENT:  CLINICAL IMPRESSION:  03/15/2023   Pt with improving soreness and is doing quite well. She still is  having pain at times with prolonged standing, UE activity, but is very intermittent, and she has been able to get it to subside with motion, stretches. We reviewed final HEP in detail today, as well as upper body mechanics, and seated posture. She is ready for d/c to HEP at this time. Pt in agreement with plan. She will f/u with MD if she has increased pain or problems.   Eval: Patient presents with primary complaint of increased pain R thoracic spine. She has increased muscle tension and pain with palpation of paraspinals today. She also has stiffness and soreness with t-spine  mobilization. She has decreased postural awareness and will benefit from posture education and strengthening. She also has lack of effective HEP for lower back pain that she has had in the past. She has decreased ability for full functional activities standing, IADLs, and cooking due to pain. Pt to benefit from skilled PT to improve deficits and pain.   OBJECTIVE IMPAIRMENTS: decreased activity tolerance, decreased mobility, decreased strength, increased muscle spasms, impaired flexibility, impaired UE functional use, and pain.   ACTIVITY LIMITATIONS: carrying, lifting, bending, standing, squatting, stairs, dressing, reach over head, and locomotion level  PARTICIPATION LIMITATIONS: meal prep, cleaning, laundry, shopping, community activity, and yard work  PERSONAL FACTORS:  none  are also affecting patient's functional outcome.   REHAB POTENTIAL: Good  CLINICAL DECISION MAKING: Stable/uncomplicated  EVALUATION COMPLEXITY: Low   GOALS: Goals reviewed with patient? Yes   SHORT TERM GOALS: Target date: 02/23/2023  Pt to be independent with initial HEP  Goal status: MET    LONG TERM GOALS: Target date: 04/06/2023   Pt to be independent with final HEP  Goal status: MET  2.  Pt to report decreased pain in R thoracic region to 0-3/10 with standing activity and cooking for at least 30 min.    Goal status:  MET   3.  Pt to demo improved soft tissue restrictions  by at least 50% to improve pain in t-spine.   Goal status: MET    PLAN:  PT FREQUENCY: 1-2x/week  PT DURATION: 8 weeks  PLANNED INTERVENTIONS: Therapeutic exercises, Therapeutic activity, Neuromuscular re-education, Balance training, Gait training, Patient/Family education, Self Care, Joint mobilization, Joint manipulation, Stair training, DME instructions, Dry Needling, Spinal manipulation, Spinal mobilization, Cryotherapy, Moist heat, Taping, Traction, Ultrasound, Ionotophoresis 4mg /ml Dexamethasone, and Manual therapy.  PLAN FOR NEXT SESSION:  education on light lumbar HEP, SKTC, start light core strength when able. STM for R thoracic paraspinals, Thoracic mobilization, Posture education and strengthening.  Cat/cow     Sedalia Muta, PT, DPT 2:22 PM  03/15/23     PHYSICAL THERAPY DISCHARGE SUMMARY  Visits from Start of Care: 7   Plan: Patient agrees to discharge.  Patient goals were  met. Patient is being discharged due to meeting the stated rehab goals.      Sedalia Muta, PT, DPT 2:27 PM  03/15/23

## 2023-03-16 ENCOUNTER — Ambulatory Visit: Payer: Medicare HMO

## 2023-03-16 ENCOUNTER — Ambulatory Visit (INDEPENDENT_AMBULATORY_CARE_PROVIDER_SITE_OTHER): Payer: Medicare HMO

## 2023-03-16 DIAGNOSIS — M8000XS Age-related osteoporosis with current pathological fracture, unspecified site, sequela: Secondary | ICD-10-CM

## 2023-03-16 DIAGNOSIS — M8080XS Other osteoporosis with current pathological fracture, unspecified site, sequela: Secondary | ICD-10-CM | POA: Diagnosis not present

## 2023-03-16 MED ORDER — DENOSUMAB 60 MG/ML ~~LOC~~ SOSY
60.0000 mg | PREFILLED_SYRINGE | Freq: Once | SUBCUTANEOUS | Status: AC
Start: 2023-03-16 — End: 2023-03-16
  Administered 2023-03-16: 60 mg via SUBCUTANEOUS

## 2023-03-16 NOTE — Telephone Encounter (Signed)
Pt used pharmacy benefit.

## 2023-03-16 NOTE — Progress Notes (Addendum)
Pt came in on the Nurse schedule to receive Prolia injection per [facility]. Administered in the back of the left arm without any complaints.

## 2023-03-17 ENCOUNTER — Encounter: Payer: Medicare HMO | Admitting: Physical Therapy

## 2023-03-22 ENCOUNTER — Encounter: Payer: Medicare HMO | Admitting: Physical Therapy

## 2023-03-22 NOTE — Progress Notes (Unsigned)
[facility] [address] [place] [address] Phone: [phone] Subjective:   Lisa Greene, am serving as a scribe for Dr. Antoine Primas.  I'm seeing this patient by the request  of:  Shelva Majestic, MD  CC: Low back pain follow-up  [id number]  01/19/2023 Patient does state that there is intermittent pain here again. We discussed the potential of repeating x-rays. States that it is only intermittent at the moment. Will continue to monitor. If worsening symptoms she would like to consider it. Follow-up with me again in 6 to 8 weeks otherwise. Due to the increasing discomfort then was given a very low dose of Zanaflex to see if this would be beneficial as well. F   Updated 03/23/2023 Lisa Greene is a 82 y.o. female coming in with complaint of back pain. Been doing well. PT has helped. No pain at all.    For her back pain has been going to physical therapy regularly.  Patient also undergone radiofrequency ablation on September 16.  Past Medical History:  Diagnosis Date   ALLERGIC RHINITIS 01/31/2008   Asthma    BCC (basal cell carcinoma)sup& nod 03/01/2017   right nostril   Breast cancer of lower-inner quadrant of left female breast (HCC) 11/01/2015   treated with lumpectomy and radiation   Cataract    Colon cancer screening 05/21/2014   No polyps at age 50. Diverticulosis alone-no more colonoscopies.     DIVERTICULITIS, HX OF 09/07/2008   pt denies this    Eosinophilia 02/08/2009   Fibroid    HYPERLIPIDEMIA 12/20/2006   Hypertension    OSTEOPOROSIS 12/20/2006   PVC's (premature ventricular contractions)    SCC (squamous cell carcinoma) well diff 11/28/2013   left side chin   Vitamin B 12 deficiency    Past Surgical History:  Procedure Laterality Date   BREAST LUMPECTOMY WITH NEEDLE LOCALIZATION Left 01/03/2016   Procedure: BREAST re-excision LUMPECTOMY WITH NEEDLE LOCALIZATION;  Surgeon: Emelia Loron, MD;  Location: [facility];  Service: General;  Laterality: Left;  BREAST re-excision LUMPECTOMY WITH NEEDLE LOCALIZATION   BREAST LUMPECTOMY WITH RADIOACTIVE SEED LOCALIZATION Left 11/28/2015   Procedure: LEFT BREAST LUMPECTOMY WITH BRACKETED RADIOACTIVE SEED LOCALIZATION;  Surgeon: Emelia Loron, MD;  Location: [facility];  Service: General;  Laterality: Left;   BREAST SURGERY     cysts removed x2   CATARACT EXTRACTION BILATERAL W/ ANTERIOR VITRECTOMY  2013   bilateral cataracts   COLONOSCOPY  2005   tics only    FOOT SURGERY     IR KYPHO LUMBAR INC FX REDUCE BONE BX UNI/BIL CANNULATION INC/IMAGING  05/28/2022   IR RADIOLOGIST EVAL & MGMT  05/21/2022   KNEE ARTHROSCOPY     left   NEUROMA SURGERY     x2 feet   thyroid duct cyst     Social History   Socioeconomic History   Marital status: Married    Spouse name: Not on file   Number of children: Not on file   Years of education: Not on file   Highest education level: Not on file  Occupational History   Occupation: retired  Tobacco Use   Smoking status: Never   Smokeless tobacco: Never  Vaping Use   Vaping status: Never Used  Substance and Sexual Activity   Alcohol use: No    Alcohol/week: 0.0 standard drinks of alcohol   Drug use: No   Sexual activity: Yes    Partners: Male  Birth control/protection: Post-menopausal  Other Topics Concern   Not on file  Social History Narrative   Married. No kids- 2 step kids. 5 step grandkids.  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[employer])      Hobbies: beach, read, crochet, knit, watch tv   Social Determinants of Health   Financial Resource Strain: Low Risk  (11/26/2022)   Overall Financial Resource Strain (CARDIA)    Difficulty of Paying Living Expenses: Not hard at all  Food Insecurity: No Food Insecurity (11/26/2022)   Hunger Vital Sign    Worried About Running Out of Food in the Last Year: Never true    Ran Out of Food in the  Last Year: Never true  Transportation Needs: No Transportation Needs (11/26/2022)   PRAPARE - Administrator, Civil Service (Medical): No    Lack of Transportation (Non-Medical): No  Physical Activity: Sufficiently Active (11/26/2022)   Exercise Vital Sign    Days of Exercise per Week: 6 days    Minutes of Exercise per Session: 30 min  Stress: No Stress Concern Present (11/26/2022)   [employer] of Occupational Health - Occupational Stress Questionnaire    Feeling of Stress : Not at all  Social Connections: Moderately Integrated (11/26/2022)   Social Connection and Isolation Panel [NHANES]    Frequency of Communication with Friends and Family: Twice a week    Frequency of Social Gatherings with Friends and Family: More than three times a week    Attends Religious Services: More than 4 times per year    Active Member of [employer] or Organizations: No    Attends Engineer, structural: Never    Marital Status: Married   Allergies  Allergen Reactions   Lisinopril Other (See Comments) and Cough    Dry cough and stinging rash   Latex Rash   Penicillins Rash    Has patient had a PCN reaction causing immediate rash, facial/tongue/throat swelling, SOB or lightheadedness with hypotension: Yes Has patient had a PCN reaction causing severe rash involving mucus membranes or skin necrosis: No Has patient had a PCN reaction that required hospitalization No Has patient had a PCN reaction occurring within the last 10 years: No If all of the above answers are "NO", then may proceed with Cephalosporin use.    Sulfonamide Derivatives Rash   Tape Rash    Band-aids break out skin!!   Family History  Problem Relation Age of Onset   Brain cancer Mother    COPD Father    Heart disease Father        stent placed 68   Allergic rhinitis Father    COPD Brother        died of flu/double pna age 60   Healthy Brother        lives local- sees Dr. Durene Cal   Arthritis Brother         back issues   Colon cancer Neg Hx    Stomach cancer Neg Hx     Current Outpatient Medications (Endocrine & Metabolic):    denosumab (PROLIA) 60 MG/ML SOSY injection, INJECT 1 SYRINGE UNDER THE SKIN ONCE EVERY 6 MONTHS.   predniSONE (DELTASONE) 10 MG tablet, Take 20 mg daily x 1 week, then 10 mg daily x 1 week.  Current Outpatient Medications (Cardiovascular):    atorvastatin (LIPITOR) 20 MG tablet, Take 1 tablet (20 mg total) by mouth daily.   hydrochlorothiazide (MICROZIDE) 12.5  MG capsule, Take 1 capsule (12.5 mg total) by mouth every Monday, Wednesday, and Friday.   irbesartan (AVAPRO) 150 MG tablet, TAKE ONE TABLET BY MOUTH DAILY   metoprolol succinate (TOPROL-XL) 50 MG 24 hr tablet, Take 1 tablet (50 mg total) by mouth daily.  Current Outpatient Medications (Respiratory):    Albuterol Sulfate (PROAIR RESPICLICK) 108 (90 Base) MCG/ACT AEPB, Inhale 1 puff into the lungs every 8 (eight) hours as needed (wheezing/shortness of breath).   BREO ELLIPTA 200-25 MCG/INH AEPB, Inhale 1 puff into the lungs daily.   cetirizine (ZYRTEC) 10 MG tablet, Take 10 mg by mouth daily.   fluticasone (FLONASE) 50 MCG/ACT nasal spray, Place 1 spray into both nostrils daily as needed.    montelukast (SINGULAIR) 10 MG tablet, Take 1 tablet by mouth at bedtime.   Current Outpatient Medications (Hematological):    Cyanocobalamin (VITAMIN B 12 PO), Take 1,000 mg by mouth every other day.  Current Outpatient Medications (Other):    azelastine (OPTIVAR) 0.05 % ophthalmic solution,    cholecalciferol (VITAMIN D) 1000 UNITS tablet, Take 2,000 Units by mouth daily.   tiZANidine (ZANAFLEX) 2 MG tablet, Take 1 tablet (2 mg total) by mouth at bedtime.     Objective  Blood pressure 122/62, pulse 70, height 5\' 1"  (1.549 m), weight 155 lb (70.3 kg), SpO2 97%.   General: No apparent distress alert and oriented x3 mood and affect normal, dressed appropriately.  HEENT: Pupils equal, extraocular movements intact   Respiratory: Patient's speak in full sentences and does not appear short of breath  Cardiovascular: No lower extremity edema, non tender, no erythema  Low back exam shows significant improvement in range of motion noted.  Patient does seem to be doing better overall.  5 out of 5 strength in lower extremities.  Mild tightness still noted on the left greater than right    Impression and Recommendations:     The above documentation has been reviewed and is accurate and complete Judi Saa, DO

## 2023-03-23 ENCOUNTER — Encounter: Payer: Self-pay | Admitting: Family Medicine

## 2023-03-23 ENCOUNTER — Ambulatory Visit: Payer: Medicare HMO | Admitting: Family Medicine

## 2023-03-23 VITALS — BP 122/62 | HR 70 | Ht 61.0 in | Wt 155.0 lb

## 2023-03-23 DIAGNOSIS — M47816 Spondylosis without myelopathy or radiculopathy, lumbar region: Secondary | ICD-10-CM

## 2023-03-23 NOTE — Assessment & Plan Note (Signed)
Significant improvement noted at this time.  Nothing that is giving her any trouble.  Patient has been discharged from physical therapy as well.  Follow-up with me again on an as-needed basis

## 2023-03-24 ENCOUNTER — Encounter: Payer: Medicare HMO | Admitting: Physical Therapy

## 2023-04-01 ENCOUNTER — Ambulatory Visit (INDEPENDENT_AMBULATORY_CARE_PROVIDER_SITE_OTHER): Payer: Medicare HMO

## 2023-04-01 DIAGNOSIS — Z23 Encounter for immunization: Secondary | ICD-10-CM | POA: Diagnosis not present

## 2023-04-16 ENCOUNTER — Encounter: Payer: Self-pay | Admitting: Family Medicine

## 2023-04-16 ENCOUNTER — Ambulatory Visit (INDEPENDENT_AMBULATORY_CARE_PROVIDER_SITE_OTHER): Payer: Medicare HMO | Admitting: Family Medicine

## 2023-04-16 VITALS — BP 137/61 | HR 72 | Temp 97.8°F | Ht 61.0 in | Wt 152.4 lb

## 2023-04-16 DIAGNOSIS — I10 Essential (primary) hypertension: Secondary | ICD-10-CM

## 2023-04-16 DIAGNOSIS — N1832 Chronic kidney disease, stage 3b: Secondary | ICD-10-CM

## 2023-04-16 DIAGNOSIS — M8080XS Other osteoporosis with current pathological fracture, unspecified site, sequela: Secondary | ICD-10-CM

## 2023-04-16 DIAGNOSIS — E785 Hyperlipidemia, unspecified: Secondary | ICD-10-CM | POA: Diagnosis not present

## 2023-04-16 DIAGNOSIS — R7989 Other specified abnormal findings of blood chemistry: Secondary | ICD-10-CM

## 2023-04-16 DIAGNOSIS — I700 Atherosclerosis of aorta: Secondary | ICD-10-CM | POA: Diagnosis not present

## 2023-04-16 MED ORDER — DOXYCYCLINE HYCLATE 100 MG PO TABS: 100.0000 mg | ORAL_TABLET | Freq: Two times a day (BID) | ORAL | 0 refills | Status: AC

## 2023-04-16 NOTE — Patient Instructions (Addendum)
Please stop by lab before you go If you have mychart- we will send your results within 3 business days of [place] receiving them.  If you do not have mychart- we will call you about results within 5 business days of [place] receiving them.  *please also note that you will see labs on mychart as soon as they post. I will later go in and write notes on them- will say "notes from Dr. Durene Cal"   - I am also going to treat for bacterial  sinusitis with 3 weeks of unrelenting symptoms- she is tender in frontal and maxillary sinuses and appears to have some drainage in pharynx. This may be secondary bacterial infection on top of baseline allergies- continue Flonase and montelukast.   Recommended follow up: Return for next already scheduled visit or sooner if needed. Verbally she agreed to let me know if she fails to get to feeling better

## 2023-04-16 NOTE — Addendum Note (Signed)
Addended by: Shelva Majestic on: 04/16/2023 02:45 PM   Modules accepted: Orders

## 2023-04-16 NOTE — Progress Notes (Signed)
Phone [phone] In person visit   Subjective:   Lisa Greene is a 82 y.o. year old very pleasant female patient who presents for/with See problem oriented charting Chief Complaint  Patient presents with   Hypertension   Fatigue    Pt does not have the energy to do things, for about 3 weeks   Hyperlipidemia   Asthma    Past Medical History-  Patient Active Problem List   Diagnosis Date Noted   PVC (premature ventricular contraction) 04/14/2017    Priority: High   History of breast cancer- Breast cancer of lower-inner quadrant of left female breast 11/01/2015    Priority: High   Low vitamin B12 level 06/24/2016    Priority: Medium    Stage 3b chronic kidney disease (HCC) 10/22/2015    Priority: Medium    Asthma, moderate persistent, well-controlled 04/19/2014    Priority: Medium    Hypertension 11/25/2010    Priority: Medium    Hyperlipidemia 12/20/2006    Priority: Medium    Osteoporosis 12/20/2006    Priority: Medium    HSV-1 (herpes simplex virus 1) infection 04/28/2022    Priority: Low   Vasomotor rhinitis 05/10/2019    Priority: Low   Chronic allergic conjunctivitis 05/10/2019    Priority: Low   Aortic atherosclerosis (HCC) 11/23/2016    Priority: Low   Family history of abdominal aortic aneurysm 03/08/2015    Priority: Low   Eosinophilia 02/08/2009    Priority: Low   Allergic rhinitis 01/31/2008    Priority: Low   Lumbar facet arthropathy 08/11/2022   Closed compression fracture of L2 lumbar vertebra, with routine healing, subsequent encounter 08/11/2022   Pain in left ankle and joints of left foot 06/18/2021    Medications- reviewed and updated Current Outpatient Medications  Medication Sig Dispense Refill   atorvastatin (LIPITOR) 20 MG tablet Take 1 tablet (20 mg total) by mouth daily. 90 tablet 3   azelastine (OPTIVAR) 0.05 % ophthalmic solution      BREO ELLIPTA 200-25 MCG/INH AEPB Inhale 1 puff into the lungs daily.     cetirizine (ZYRTEC)  10 MG tablet Take 10 mg by mouth daily.     cholecalciferol (VITAMIN D) 1000 UNITS tablet Take 2,000 Units by mouth daily.     Cyanocobalamin (VITAMIN B 12 PO) Take 1,000 mg by mouth every other day.     denosumab (PROLIA) 60 MG/ML SOSY injection INJECT 1 SYRINGE UNDER THE SKIN ONCE EVERY 6 MONTHS. 1 mL 0   doxycycline (VIBRA-TABS) 100 MG tablet Take 1 tablet (100 mg total) by mouth 2 (two) times daily for 7 days. 14 tablet 0   fluticasone (FLONASE) 50 MCG/ACT nasal spray Place 1 spray into both nostrils daily as needed.      hydrochlorothiazide (MICROZIDE) 12.5 MG capsule Take 1 capsule (12.5 mg total) by mouth every Monday, Wednesday, and Friday. 45 capsule 2   irbesartan (AVAPRO) 150 MG tablet TAKE ONE TABLET BY MOUTH DAILY 90 tablet 3   metoprolol succinate (TOPROL-XL) 50 MG 24 hr tablet Take 1 tablet (50 mg total) by mouth daily. 90 tablet 3   montelukast (SINGULAIR) 10 MG tablet Take 1 tablet by mouth at bedtime.     tiZANidine (ZANAFLEX) 2 MG tablet Take 1 tablet (2 mg total) by mouth at bedtime. 30 tablet 0   Albuterol Sulfate (PROAIR RESPICLICK) 108 (90 Base) MCG/ACT AEPB Inhale 1 puff into the lungs every 8 (eight) hours as needed (wheezing/shortness of breath). (Patient not taking: Reported on  04/16/2023)     No current facility-administered medications for this visit.     Objective:  BP 137/61   Pulse 72   Temp 97.8 F (36.6 C)   Ht 5\' 1"  (1.549 m)   Wt 152 lb 6.4 oz (69.1 kg)   BMI 28.80 kg/m  Gen: NAD, resting comfortably Frontal and maxillary sinus tenderness, pharynx with mild erythema/signs of darinage, nasal turbinates edematous CV: RRR no murmurs rubs or gallops Lungs: CTAB no crackles, wheeze, rhonchi Abdomen: soft/nontender/nondistended/normal bowel sounds. No rebound or guarding.  Ext: no edema Skin: warm, dry    Assessment and Plan   # Fatigue/low energy S: Patient reports fatigue and low energy for the last 3 weeks. Only other symptoms have been some  possible sinus headaches with frontal sinus tenderness- hurts if presses on area. Some hoarseness here or there and some cough but mainly dry. Just feels run down. No recent COVID- didn't test during this time. Got a flu shot on 10/31 but was already feeling fatigued at that point. No fever or chills. No shortness of breath or chest pain. Has to rest after activity. Morning she gets things done slowly but feels tired- and cannot do it again later in the day like usual. Her phone says she is walking slowly and tells her to speed up. No persistent swelling in legs. Occasional mild nausea- caffeine free coke hlps  -History of breast cancer but she is up-to-date on imaging and exams-released from Dr. Dwain Sarna last year and Dr. Pamelia Hoit previously- mammogram in July  -still does Flonase and montelukast helps her A/P: new onset fatigue in last 3 weeks- we will update labs today - I am also going to treat for bacterial  sinusitis with 3 weeks of unrelenting symptoms- she is tender in frontal and maxillary sinuses and appears to have some drainage in pharynx. This may be secondary bacterial infection on top of baseline allergies- continue Flonase and montelukast.  -would likely use doxycycline with penicillin allergy  - heart rate normal today- doubt bradycardia related on beta blocker  #hyperlipidemia #aortic atherosclerosis- LDL goal under 70 ideally S: Medication:atorvastatin 20 mg  Lab Results  Component Value Date   CHOL 148 07/14/2022   HDL 72.50 07/14/2022   LDLCALC 61 07/14/2022   LDLDIRECT 59.0 10/17/2019   TRIG 71.0 07/14/2022   CHOLHDL 2 07/14/2022  A/P: HLD-at goal- continue current medications  Aortic atherosclerosis (presumed stable)- LDL goal ideally <70 - continue current medications   #hypertension with white coat element/PVCs on metoprolol S: medication: irbesartan 150mg , metoprolol 50 mg XR, hydrochlorothiazide 12.5 mg Monday Wednesday Friday -Edema on amlodipine and with HCTZ in  the past had mild hyponatremia A/P: hypertension reasonably controlled continue current medications -with fatigue she wants to check sodium to be on cautious side- I agree  #Chronic kidney disease stage III S: GFR is typically in the 40s range -Patient knows to avoid NSAIDs  -hold irbesartan if gets dehydrated A/P: hopefully stable- update CMP today. Continue without meds for now    # Asthma/allergies- follows with Dr. Lyla Son: Maintenance Medication: Breo and singulair (astelin and flonase and zyrtec as needed as well). No wheezing with fatigue. Not needing albuterol A/P: reasonable control- does not appear to be cause of fatigue -continue current medications    #Osteoporosis- on Prolia. with fatigue check D   # B12 deficiency S: Current treatment/medication (oral vs. IM):  b12 every other day now Lab Results  Component Value Date   VITAMINB12 904 07/14/2022  A/P: hopefully stable- update b12 today. Continue current meds for now    Recommended follow up: Return for next already scheduled visit or sooner if needed. Future Appointments  Date Time Provider Department Center  07/26/2023  9:00 AM Shelva Majestic, MD [facility]  12/07/2023  9:15 AM [facility] ANNUAL WELLNESS VISIT 1 [facility]   Lab/Order associations:   ICD-10-CM   1. Primary hypertension  I10 Comprehensive metabolic panel    CBC with Differential/Platelet    TSH    2. Hyperlipidemia, unspecified hyperlipidemia type  E78.5 Comprehensive metabolic panel    CBC with Differential/Platelet    TSH    3. Low vitamin B12 level  R79.89 Vitamin B12    4. Localized osteoporosis with current pathological fracture, sequela  M80.80XS VITAMIN D 25 Hydroxy (Vit-D Deficiency, Fractures)    5. Stage 3b chronic kidney disease (HCC)  N18.32     6. Aortic atherosclerosis (HCC)  I70.0      Meds ordered this encounter  Medications   doxycycline (VIBRA-TABS) 100 MG tablet    Sig: Take 1 tablet (100 mg total) by mouth 2  (two) times daily for 7 days.    Dispense:  14 tablet    Refill:  0   Return precautions advised.  Tana Conch, MD

## 2023-04-17 LAB — CBC WITH DIFFERENTIAL/PLATELET
Absolute Lymphocytes: 1600 {cells}/uL (ref 850–3900)
Absolute Monocytes: 851 {cells}/uL (ref 200–950)
Basophils Absolute: 69 {cells}/uL (ref 0–200)
Basophils Relative: 0.8 %
Eosinophils Absolute: 181 {cells}/uL (ref 15–500)
Eosinophils Relative: 2.1 %
HCT: 37.5 % (ref 35.0–45.0)
Hemoglobin: 12.6 g/dL (ref 11.7–15.5)
MCH: 30.8 pg (ref 27.0–33.0)
MCHC: 33.6 g/dL (ref 32.0–36.0)
MCV: 91.7 fL (ref 80.0–100.0)
MPV: 8.9 fL (ref 7.5–12.5)
Monocytes Relative: 9.9 %
Neutro Abs: 5900 {cells}/uL (ref 1500–7800)
Neutrophils Relative %: 68.6 %
Platelets: 329 10*3/uL (ref 140–400)
RBC: 4.09 10*6/uL (ref 3.80–5.10)
RDW: 12.4 % (ref 11.0–15.0)
Total Lymphocyte: 18.6 %
WBC: 8.6 10*3/uL (ref 3.8–10.8)

## 2023-04-17 LAB — COMPREHENSIVE METABOLIC PANEL
AG Ratio: 2 (calc) (ref 1.0–2.5)
ALT: 13 U/L (ref 6–29)
AST: 19 U/L (ref 10–35)
Albumin: 4.1 g/dL (ref 3.6–5.1)
Alkaline phosphatase (APISO): 55 U/L (ref 37–153)
BUN/Creatinine Ratio: 15 (calc) (ref 6–22)
BUN: 21 mg/dL (ref 7–25)
CO2: 24 mmol/L (ref 20–32)
Calcium: 9.7 mg/dL (ref 8.6–10.4)
Chloride: 102 mmol/L (ref 98–110)
Creat: 1.38 mg/dL — ABNORMAL HIGH (ref 0.60–0.95)
Globulin: 2.1 g/dL (ref 1.9–3.7)
Glucose, Bld: 83 mg/dL (ref 65–99)
Potassium: 4.8 mmol/L (ref 3.5–5.3)
Sodium: 136 mmol/L (ref 135–146)
Total Bilirubin: 0.8 mg/dL (ref 0.2–1.2)
Total Protein: 6.2 g/dL (ref 6.1–8.1)

## 2023-04-17 LAB — VITAMIN D 25 HYDROXY (VIT D DEFICIENCY, FRACTURES): Vit D, 25-Hydroxy: 55 ng/mL (ref 30–100)

## 2023-04-17 LAB — VITAMIN B12: Vitamin B-12: 707 pg/mL (ref 200–1100)

## 2023-04-17 LAB — TSH: TSH: 1.92 m[IU]/L (ref 0.40–4.50)

## 2023-04-20 ENCOUNTER — Ambulatory Visit: Payer: Medicare HMO | Admitting: Family Medicine

## 2023-04-20 DIAGNOSIS — J300 Vasomotor rhinitis: Secondary | ICD-10-CM | POA: Diagnosis not present

## 2023-04-20 DIAGNOSIS — H1045 Other chronic allergic conjunctivitis: Secondary | ICD-10-CM | POA: Diagnosis not present

## 2023-04-20 DIAGNOSIS — J4540 Moderate persistent asthma, uncomplicated: Secondary | ICD-10-CM | POA: Diagnosis not present

## 2023-05-20 DIAGNOSIS — R69 Illness, unspecified: Secondary | ICD-10-CM | POA: Diagnosis not present

## 2023-05-27 ENCOUNTER — Ambulatory Visit: Payer: Medicare HMO | Admitting: Family

## 2023-05-27 ENCOUNTER — Encounter: Payer: Self-pay | Admitting: Family

## 2023-05-27 ENCOUNTER — Ambulatory Visit: Payer: Self-pay | Admitting: Family Medicine

## 2023-05-27 VITALS — BP 154/72 | HR 70 | Temp 97.3°F | Ht 61.0 in | Wt 154.2 lb

## 2023-05-27 DIAGNOSIS — R059 Cough, unspecified: Secondary | ICD-10-CM

## 2023-05-27 LAB — POCT INFLUENZA A/B
Influenza A, POC: NEGATIVE
Influenza B, POC: NEGATIVE

## 2023-05-27 LAB — POC COVID19 BINAXNOW: SARS Coronavirus 2 Ag: NEGATIVE

## 2023-05-27 NOTE — Telephone Encounter (Signed)
Copied from CRM [id number]. Topic: Clinical - Red Word Triage >> May 27, 2023 11:09 AM Marica Otter wrote: Red Word that prompted transfer to Nurse Triage: Patient states she has a cough that's getting worse and some chest tightness.  Chief Complaint: Cough that is getting worse.  Medical History :Asthma has not used albuterol. Education provided on albuterol r/t coughing and chest tightness. Symptoms:   Cough - deep cough-  Nothing is coming up. Chest tightness from coughing. Advised to use " rescuer" Albuterol now and repeat for relief of chest tightgness. Frequency:   Cough seems to be getting worse and deep. Cough is throughout the day. Denies cough interferring with her sleep. Pertinent Negatives: Patient denies wheezing. Disposition: [] ED /[] Urgent Care (no appt availability in office) / [x] Appointment(In office/virtual)/ []  [place]/ [] Home Care/ [] Refused Recommended Disposition /[] [place] [facility]/ []  Follow-up with PCP Additional Notes:  Provided review and recommendations. Patient verbalized understanding.  Reason for Disposition  [1] MILD difficulty breathing (e.g., minimal/no SOB at rest, SOB with walking, pulse <100) AND [2] still present when not coughing  Answer Assessment - Initial Assessment Questions 1. ONSET: "When did the cough begin?"       Cough started has been almost a week.  It was hacky at first , but now it is weekend 2. SEVERITY: "How bad is the cough today?"         Patient reports severity   3. SPUTUM: "Describe the color of your sputum" (none, dry cough; clear, white, yellow, green)     Dry Cough 4. HEMOPTYSIS: "Are you coughing up any blood?" If so ask: "How much?" (flecks, streaks, tablespoons, etc.)    Denies  5. DIFFICULTY BREATHING: "Are you having difficulty breathing?" If Yes, ask: "How bad is it?" (e.g., mild, moderate, severe)    - MILD: No SOB at rest, mild SOB with walking, speaks normally in sentences, can lie down, no retractions,  pulse < 100.    - MODERATE: SOB at rest, SOB with minimal exertion and prefers to sit, cannot lie down flat, speaks in phrases, mild retractions, audible wheezing, pulse 100-120.   6. FEVER: "Do you have a fever?" If Yes, ask: "What is your temperature, how was it measured, and when did it start?"      Have not  taken her temp. Get hot often . 7. CARDIAC HISTORY: "Do you have any history of heart disease?" (e.g., heart attack, congestive heart failure)       Extra Heartbeat - Afib  under control  with medications. 8. LUNG HISTORY: "Do you have any history of lung disease?"  (e.g., pulmonary embolus, asthma, emphysema)     Asthma- very mild asthma- Allergist  asked if she  wanting to stop 9. PE RISK FACTORS: "Do you have a history of blood clots?" (or: recent major surgery, recent prolonged travel, bedridden)     Denies 10. OTHER SYMPTOMS: "Do you have any other symptoms?" (e.g., runny nose, wheezing, chest pain)        Chest tightness , denies wheezing.  Protocols used: Cough - Acute Non-Productive-A-AH

## 2023-05-27 NOTE — Progress Notes (Signed)
Patient ID: Lisa Greene, female    DOB: 12/16/1940, 82 y.o.   MRN: [id number]  Chief Complaint  Patient presents with   Cough    Pt c/o Cough x 3 days- Not Productive - No fever- Chest tightness from coughing - denies wheezing. Has tried breo and albuterol inhaler.        Discussed the use of AI scribe software for clinical note transcription with the patient, who gave verbal consent to proceed.  History of Present Illness   The patient, with a history of asthma and irregular heartbeat, presents with a cough and chest tightness for a few days. The patient has not tested for COVID-19 at home and has received a flu shot this year. The patient does not have a fever and does not feel sick, with symptoms primarily in the chest. The patient has been using her Breo inhaler qd and has a rescue inhaler, but does not find it makes a lot of difference. The patient also takes allergy medication and montelukast at night for allergies/asthma. The patient has not needed to sleep propped up and does not have a humidifier at home.     Assessment & Plan:     Cough/possible Asthma flare - Lungs clear. Symptoms for a few days with no fever. No other upper respiratory symptoms. No improvement with use of Breo inhaler and Singulair for asthma. Covid & Flu testing today negative. -Continue using Breo daily. -Restart Albuterol inhaler 1-2 puffs in the morning before breakfast and about 1 hour prior to bedtime for the next 3 days. Can also use around midday if having symptoms. -Consider use of humidifier at home to alleviate dryness that may contribute to cough, especially overnight. -Call back Monday if symptoms have not improved, can also call provider on call over weekend if necessary.     Subjective:    Outpatient Medications Prior to Visit  Medication Sig Dispense Refill   Albuterol Sulfate (PROAIR RESPICLICK) 108 (90 Base) MCG/ACT AEPB Inhale 1 puff into the lungs every 8 (eight) hours as needed  (wheezing/shortness of breath).     atorvastatin (LIPITOR) 20 MG tablet Take 1 tablet (20 mg total) by mouth daily. 90 tablet 3   azelastine (OPTIVAR) 0.05 % ophthalmic solution      BREO ELLIPTA 200-25 MCG/INH AEPB Inhale 1 puff into the lungs daily.     cetirizine (ZYRTEC) 10 MG tablet Take 10 mg by mouth daily.     cholecalciferol (VITAMIN D) 1000 UNITS tablet Take 2,000 Units by mouth daily.     Cyanocobalamin (VITAMIN B 12 PO) Take 1,000 mg by mouth every other day.     denosumab (PROLIA) 60 MG/ML SOSY injection INJECT 1 SYRINGE UNDER THE SKIN ONCE EVERY 6 MONTHS. 1 mL 0   fluticasone (FLONASE) 50 MCG/ACT nasal spray Place 1 spray into both nostrils daily as needed.      hydrochlorothiazide (MICROZIDE) 12.5 MG capsule Take 1 capsule (12.5 mg total) by mouth every Monday, Wednesday, and Friday. 45 capsule 2   irbesartan (AVAPRO) 150 MG tablet TAKE ONE TABLET BY MOUTH DAILY 90 tablet 3   metoprolol succinate (TOPROL-XL) 50 MG 24 hr tablet Take 1 tablet (50 mg total) by mouth daily. 90 tablet 3   montelukast (SINGULAIR) 10 MG tablet Take 1 tablet by mouth at bedtime.     tiZANidine (ZANAFLEX) 2 MG tablet Take 1 tablet (2 mg total) by mouth at bedtime. 30 tablet 0   No facility-administered medications prior to visit.  Past Medical History:  Diagnosis Date   ALLERGIC RHINITIS 01/31/2008   Asthma    BCC (basal cell carcinoma)sup& nod 03/01/2017   right nostril   Breast cancer of lower-inner quadrant of left female breast (HCC) 11/01/2015   treated with lumpectomy and radiation   Cataract    Colon cancer screening 05/21/2014   No polyps at age 68. Diverticulosis alone-no more colonoscopies.     DIVERTICULITIS, HX OF 09/07/2008   pt denies this    Eosinophilia 02/08/2009   Fibroid    HYPERLIPIDEMIA 12/20/2006   Hypertension    OSTEOPOROSIS 12/20/2006   PVC's (premature ventricular contractions)    SCC (squamous cell carcinoma) well diff 11/28/2013   left side chin   Vitamin B 12  deficiency    Past Surgical History:  Procedure Laterality Date   BREAST LUMPECTOMY WITH NEEDLE LOCALIZATION Left 01/03/2016   Procedure: BREAST re-excision LUMPECTOMY WITH NEEDLE LOCALIZATION;  Surgeon: Emelia Loron, MD;  Location: [facility];  Service: General;  Laterality: Left;  BREAST re-excision LUMPECTOMY WITH NEEDLE LOCALIZATION   BREAST LUMPECTOMY WITH RADIOACTIVE SEED LOCALIZATION Left 11/28/2015   Procedure: LEFT BREAST LUMPECTOMY WITH BRACKETED RADIOACTIVE SEED LOCALIZATION;  Surgeon: Emelia Loron, MD;  Location: [facility];  Service: General;  Laterality: Left;   BREAST SURGERY     cysts removed x2   CATARACT EXTRACTION BILATERAL W/ ANTERIOR VITRECTOMY  2013   bilateral cataracts   COLONOSCOPY  2005   tics only    FOOT SURGERY     IR KYPHO LUMBAR INC FX REDUCE BONE BX UNI/BIL CANNULATION INC/IMAGING  05/28/2022   IR RADIOLOGIST EVAL & MGMT  05/21/2022   KNEE ARTHROSCOPY     left   NEUROMA SURGERY     x2 feet   thyroid duct cyst     Allergies  Allergen Reactions   Lisinopril Other (See Comments) and Cough    Dry cough and stinging rash   Latex Rash   Penicillins Rash    Has patient had a PCN reaction causing immediate rash, facial/tongue/throat swelling, SOB or lightheadedness with hypotension: Yes Has patient had a PCN reaction causing severe rash involving mucus membranes or skin necrosis: No Has patient had a PCN reaction that required hospitalization No Has patient had a PCN reaction occurring within the last 10 years: No If all of the above answers are "NO", then may proceed with Cephalosporin use.    Sulfonamide Derivatives Rash   Tape Rash    Band-aids break out skin!!      Objective:    Physical Exam Vitals and nursing note reviewed.  Constitutional:      Appearance: Normal appearance.  Cardiovascular:     Rate and Rhythm: Normal rate and regular rhythm.  Pulmonary:     Effort: Pulmonary effort is normal.      Breath sounds: Normal breath sounds.  Musculoskeletal:        General: Normal range of motion.  Skin:    General: Skin is warm and dry.  Neurological:     Mental Status: She is alert.  Psychiatric:        Mood and Affect: Mood normal.        Behavior: Behavior normal.    BP (!) 154/72 (BP Location: Left Arm, Patient Position: Sitting, Cuff Size: Normal)   Pulse 70   Temp (!) 97.3 F (36.3 C) (Temporal)   Ht 5\' 1"  (1.549 m)   Wt 154 lb 3.2 oz (69.9 kg)   SpO2  98%   BMI 29.14 kg/m  Wt Readings from Last 3 Encounters:  05/27/23 154 lb 3.2 oz (69.9 kg)  04/16/23 152 lb 6.4 oz (69.1 kg)  03/23/23 155 lb (70.3 kg)      Dulce Sellar, NP

## 2023-05-31 DIAGNOSIS — J300 Vasomotor rhinitis: Secondary | ICD-10-CM | POA: Diagnosis not present

## 2023-05-31 DIAGNOSIS — J4540 Moderate persistent asthma, uncomplicated: Secondary | ICD-10-CM | POA: Diagnosis not present

## 2023-05-31 DIAGNOSIS — J209 Acute bronchitis, unspecified: Secondary | ICD-10-CM | POA: Diagnosis not present

## 2023-05-31 DIAGNOSIS — H1045 Other chronic allergic conjunctivitis: Secondary | ICD-10-CM | POA: Diagnosis not present

## 2023-06-07 DIAGNOSIS — L538 Other specified erythematous conditions: Secondary | ICD-10-CM | POA: Diagnosis not present

## 2023-06-07 DIAGNOSIS — L814 Other melanin hyperpigmentation: Secondary | ICD-10-CM | POA: Diagnosis not present

## 2023-06-07 DIAGNOSIS — Z789 Other specified health status: Secondary | ICD-10-CM | POA: Diagnosis not present

## 2023-06-07 DIAGNOSIS — L820 Inflamed seborrheic keratosis: Secondary | ICD-10-CM | POA: Diagnosis not present

## 2023-06-07 DIAGNOSIS — L821 Other seborrheic keratosis: Secondary | ICD-10-CM | POA: Diagnosis not present

## 2023-06-07 DIAGNOSIS — Z08 Encounter for follow-up examination after completed treatment for malignant neoplasm: Secondary | ICD-10-CM | POA: Diagnosis not present

## 2023-06-07 DIAGNOSIS — D225 Melanocytic nevi of trunk: Secondary | ICD-10-CM | POA: Diagnosis not present

## 2023-06-07 DIAGNOSIS — L2989 Other pruritus: Secondary | ICD-10-CM | POA: Diagnosis not present

## 2023-06-09 DIAGNOSIS — K080 Exfoliation of teeth due to systemic causes: Secondary | ICD-10-CM | POA: Diagnosis not present

## 2023-07-26 ENCOUNTER — Ambulatory Visit (INDEPENDENT_AMBULATORY_CARE_PROVIDER_SITE_OTHER): Payer: Medicare Other | Admitting: Family Medicine

## 2023-07-26 ENCOUNTER — Encounter: Payer: Self-pay | Admitting: Family Medicine

## 2023-07-26 VITALS — BP 119/66 | Temp 97.0°F | Ht 61.0 in | Wt 153.0 lb

## 2023-07-26 DIAGNOSIS — N1832 Chronic kidney disease, stage 3b: Secondary | ICD-10-CM

## 2023-07-26 DIAGNOSIS — I10 Essential (primary) hypertension: Secondary | ICD-10-CM | POA: Diagnosis not present

## 2023-07-26 DIAGNOSIS — M8080XS Other osteoporosis with current pathological fracture, unspecified site, sequela: Secondary | ICD-10-CM | POA: Diagnosis not present

## 2023-07-26 DIAGNOSIS — E785 Hyperlipidemia, unspecified: Secondary | ICD-10-CM

## 2023-07-26 DIAGNOSIS — Z131 Encounter for screening for diabetes mellitus: Secondary | ICD-10-CM | POA: Diagnosis not present

## 2023-07-26 DIAGNOSIS — I700 Atherosclerosis of aorta: Secondary | ICD-10-CM

## 2023-07-26 DIAGNOSIS — E663 Overweight: Secondary | ICD-10-CM | POA: Diagnosis not present

## 2023-07-26 DIAGNOSIS — Z0000 Encounter for general adult medical examination without abnormal findings: Secondary | ICD-10-CM

## 2023-07-26 DIAGNOSIS — R7989 Other specified abnormal findings of blood chemistry: Secondary | ICD-10-CM

## 2023-07-26 LAB — CBC WITH DIFFERENTIAL/PLATELET
Basophils Absolute: 0.1 10*3/uL (ref 0.0–0.1)
Basophils Relative: 1.1 % (ref 0.0–3.0)
Eosinophils Absolute: 0.4 10*3/uL (ref 0.0–0.7)
Eosinophils Relative: 5.5 % — ABNORMAL HIGH (ref 0.0–5.0)
HCT: 38.7 % (ref 36.0–46.0)
Hemoglobin: 13.2 g/dL (ref 12.0–15.0)
Lymphocytes Relative: 21.4 % (ref 12.0–46.0)
Lymphs Abs: 1.5 10*3/uL (ref 0.7–4.0)
MCHC: 34.2 g/dL (ref 30.0–36.0)
MCV: 90.5 fl (ref 78.0–100.0)
Monocytes Absolute: 0.6 10*3/uL (ref 0.1–1.0)
Monocytes Relative: 8.6 % (ref 3.0–12.0)
Neutro Abs: 4.4 10*3/uL (ref 1.4–7.7)
Neutrophils Relative %: 63.4 % (ref 43.0–77.0)
Platelets: 313 10*3/uL (ref 150.0–400.0)
RBC: 4.27 Mil/uL (ref 3.87–5.11)
RDW: 13.8 % (ref 11.5–15.5)
WBC: 6.9 10*3/uL (ref 4.0–10.5)

## 2023-07-26 LAB — COMPREHENSIVE METABOLIC PANEL
ALT: 12 U/L (ref 0–35)
AST: 17 U/L (ref 0–37)
Albumin: 4 g/dL (ref 3.5–5.2)
Alkaline Phosphatase: 49 U/L (ref 39–117)
BUN: 16 mg/dL (ref 6–23)
CO2: 25 meq/L (ref 19–32)
Calcium: 9.1 mg/dL (ref 8.4–10.5)
Chloride: 104 meq/L (ref 96–112)
Creatinine, Ser: 1.15 mg/dL (ref 0.40–1.20)
GFR: 44.2 mL/min — ABNORMAL LOW (ref >60.00)
Glucose, Bld: 82 mg/dL (ref 70–99)
Potassium: 4 meq/L (ref 3.5–5.1)
Sodium: 138 meq/L (ref 135–145)
Total Bilirubin: 0.7 mg/dL (ref 0.2–1.2)
Total Protein: 6.4 g/dL (ref 6.0–8.3)

## 2023-07-26 LAB — LIPID PANEL
Cholesterol: 163 mg/dL (ref 0–200)
HDL: 76.5 mg/dL (ref >39.00)
LDL Cholesterol: 68 mg/dL (ref 0–99)
NonHDL: 86.95
Total CHOL/HDL Ratio: 2
Triglycerides: 96 mg/dL (ref 0.0–149.0)
VLDL: 19.2 mg/dL (ref 0.0–40.0)

## 2023-07-26 LAB — HEMOGLOBIN A1C: Hgb A1c MFr Bld: 6.2 % (ref 4.6–6.5)

## 2023-07-26 NOTE — Patient Instructions (Addendum)
 Please stop by lab before you go If you have mychart- we will send your results within 3 business days of [place] receiving them.  If you do not have mychart- we will call you about results within 5 business days of [place] receiving them.  *please also note that you will see labs on mychart as soon as they post. I will later go in and write notes on them- will say "notes from Dr. Durene Cal"   Recommended follow up: Return in about 6 months (around 01/23/2024) for followup or sooner if needed.Schedule b4 you leave.

## 2023-07-26 NOTE — Progress Notes (Signed)
 Phone [phone]   Subjective:  Patient presents today for their annual physical. Chief complaint-noted.   See problem oriented charting- ROS- full  review of systems was completed and negative except for: recovered from prior bronchitis in December  The following were reviewed and entered/updated in epic: Past Medical History:  Diagnosis Date   ALLERGIC RHINITIS 01/31/2008   Allergy 1960   Asthma 2019   BCC (basal cell carcinoma)sup& nod 03/01/2017   right nostril   Breast cancer of lower-inner quadrant of left female breast (HCC) 11/01/2015   treated with lumpectomy and radiation   Cataract    Colon cancer screening 05/21/2014   No polyps at age 56. Diverticulosis alone-no more colonoscopies.     DIVERTICULITIS, HX OF 09/07/2008   pt denies this    Eosinophilia 02/08/2009   Fibroid    Heart murmur    HYPERLIPIDEMIA 12/20/2006   Hypertension    OSTEOPOROSIS 12/20/2006   PVC's (premature ventricular contractions)    SCC (squamous cell carcinoma) well diff 11/28/2013   left side chin   Vitamin B 12 deficiency    Patient Active Problem List   Diagnosis Date Noted   PVC (premature ventricular contraction) 04/14/2017    Priority: High   History of breast cancer- Breast cancer of lower-inner quadrant of left female breast 11/01/2015    Priority: High   Low vitamin B12 level 06/24/2016    Priority: Medium    Stage 3b chronic kidney disease (HCC) 10/22/2015    Priority: Medium    Asthma, moderate persistent, well-controlled 04/19/2014    Priority: Medium    Hypertension 11/25/2010    Priority: Medium    Hyperlipidemia 12/20/2006    Priority: Medium    Osteoporosis 12/20/2006    Priority: Medium    HSV-1 (herpes simplex virus 1) infection 04/28/2022    Priority: Low   Vasomotor rhinitis 05/10/2019    Priority: Low   Chronic allergic conjunctivitis 05/10/2019    Priority: Low   Aortic atherosclerosis (HCC) 11/23/2016    Priority: Low   Family history of  abdominal aortic aneurysm 03/08/2015    Priority: Low   Eosinophilia 02/08/2009    Priority: Low   Allergic rhinitis 01/31/2008    Priority: Low   Lumbar facet arthropathy 08/11/2022    Priority: 1.   Closed compression fracture of L2 lumbar vertebra, with routine healing, subsequent encounter 08/11/2022    Priority: 1.   Pain in left ankle and joints of left foot 06/18/2021    Priority: 1.   Past Surgical History:  Procedure Laterality Date   BREAST LUMPECTOMY WITH NEEDLE LOCALIZATION Left 01/03/2016   Procedure: BREAST re-excision LUMPECTOMY WITH NEEDLE LOCALIZATION;  Surgeon: Emelia Loron, MD;  Location: [facility];  Service: General;  Laterality: Left;  BREAST re-excision LUMPECTOMY WITH NEEDLE LOCALIZATION   BREAST LUMPECTOMY WITH RADIOACTIVE SEED LOCALIZATION Left 11/28/2015   Procedure: LEFT BREAST LUMPECTOMY WITH BRACKETED RADIOACTIVE SEED LOCALIZATION;  Surgeon: Emelia Loron, MD;  Location: [facility];  Service: General;  Laterality: Left;   BREAST SURGERY  2017   cysts removed x2   CATARACT EXTRACTION BILATERAL W/ ANTERIOR VITRECTOMY  2013   bilateral cataracts   COLONOSCOPY  2005   tics only    FOOT SURGERY     IR KYPHO LUMBAR INC FX REDUCE BONE BX UNI/BIL CANNULATION INC/IMAGING  05/28/2022   IR RADIOLOGIST EVAL & MGMT  05/21/2022   KNEE ARTHROSCOPY     left   NEUROMA SURGERY  x2 feet   thyroid duct cyst      Family History  Problem Relation Age of Onset   Brain cancer Mother    COPD Father    Heart disease Father        stent placed 37   Allergic rhinitis Father    COPD Brother        died of flu/double pna age 45   Healthy Brother        lives local- sees Dr. Durene Cal   Arthritis Brother        back issues   Colon cancer Neg Hx    Stomach cancer Neg Hx     Medications- reviewed and updated Current Outpatient Medications  Medication Sig Dispense Refill   Albuterol Sulfate (PROAIR RESPICLICK) 108 (90 Base)  MCG/ACT AEPB Inhale 1 puff into the lungs every 8 (eight) hours as needed (wheezing/shortness of breath).     atorvastatin (LIPITOR) 20 MG tablet Take 1 tablet (20 mg total) by mouth daily. 90 tablet 3   azelastine (OPTIVAR) 0.05 % ophthalmic solution      BREO ELLIPTA 200-25 MCG/INH AEPB Inhale 1 puff into the lungs daily.     cetirizine (ZYRTEC) 10 MG tablet Take 10 mg by mouth daily.     cholecalciferol (VITAMIN D) 1000 UNITS tablet Take 2,000 Units by mouth daily.     Cyanocobalamin (VITAMIN B 12 PO) Take 1,000 mg by mouth every other day.     denosumab (PROLIA) 60 MG/ML SOSY injection INJECT 1 SYRINGE UNDER THE SKIN ONCE EVERY 6 MONTHS. 1 mL 0   fluticasone (FLONASE) 50 MCG/ACT nasal spray Place 1 spray into both nostrils daily as needed.      hydrochlorothiazide (MICROZIDE) 12.5 MG capsule Take 1 capsule (12.5 mg total) by mouth every Monday, Wednesday, and Friday. 45 capsule 2   irbesartan (AVAPRO) 150 MG tablet TAKE ONE TABLET BY MOUTH DAILY 90 tablet 3   metoprolol succinate (TOPROL-XL) 50 MG 24 hr tablet Take 1 tablet (50 mg total) by mouth daily. 90 tablet 3   montelukast (SINGULAIR) 10 MG tablet Take 1 tablet by mouth at bedtime.     tiZANidine (ZANAFLEX) 2 MG tablet Take 1 tablet (2 mg total) by mouth at bedtime. 30 tablet 0   No current facility-administered medications for this visit.    Allergies-reviewed and updated Allergies  Allergen Reactions   Lisinopril Other (See Comments) and Cough    Dry cough and stinging rash   Latex Rash   Penicillins Rash    Has patient had a PCN reaction causing immediate rash, facial/tongue/throat swelling, SOB or lightheadedness with hypotension: Yes Has patient had a PCN reaction causing severe rash involving mucus membranes or skin necrosis: No Has patient had a PCN reaction that required hospitalization No Has patient had a PCN reaction occurring within the last 10 years: No If all of the above answers are "NO", then may proceed with  Cephalosporin use.    Sulfonamide Derivatives Rash   Tape Rash    Band-aids break out skin!!    Social History   Social History Narrative   Married. No kids- 2 step kids. 5 step grandkids.  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 Oropharynx normal Neck: no thyromegaly CV: RRR no murmurs rubs or gallops Lungs: CTAB no crackles, wheeze, rhonchi Abdomen: soft/nontender/nondistended/normal bowel sounds. No rebound or guarding.  Ext: no edema Skin: warm, dry Neuro: grossly normal, moves all extremities, PERRLA   Assessment and Plan   83 y.o. female presenting for annual physical.  Health Maintenance counseling: 1. Anticipatory guidance: Patient counseled regarding regular dental exams -q6 months, eye exams - yearly,  avoiding smoking and second hand smoke , limiting alcohol to 1 beverage per day- doesn't drink , no illicit drugs .   2. Risk factor reduction:  Advised patient of need for regular exercise and diet rich and fruits and vegetables to reduce risk of heart attack and stroke.  Exercise-last year with compression fracture had a setback on her exercise but was planning to restart on her bike- doing 40- 50 minutes 5 days a week .  Diet/weight management-weight up 7 pounds in the last year-some variability at home. Stability likely key at her age Wt Readings from Last 3 Encounters:  07/26/23 153 lb (69.4 kg)  05/27/23 154 lb 3.2 oz (69.9 kg)  04/16/23 152 lb 6.4 oz (69.1 kg)  3. Immunizations/screenings/ancillary studies-holding off on further COVID-19 vaccinations but otherwise up-to-date  Immunization History  Administered Date(s) Administered   Fluad  Quad(high Dose 65+) 04/07/2019, 05/07/2020, 04/01/2022   Fluad Trivalent(High Dose 65+) 04/01/2023   Hepatitis A 10/22/1998   Influenza Whole 03/02/2011   Influenza, High Dose Seasonal PF 05/04/2016, 05/05/2019, 04/15/2021   PFIZER(Purple Top)SARS-COV-2 Vaccination 06/22/2019, 07/13/2019, 03/07/2020, 05/03/2020   Pneumococcal Conjugate-13 01/19/2007, 04/19/2014, 06/06/2015   Pneumococcal Polysaccharide-23 01/19/2007, 10/08/2014, 11/04/2015, 05/03/2017, 05/10/2018, 05/05/2019, 05/03/2020   Rsv, Bivalent, Protein Subunit Rsvpref,pf Verdis Frederickson) 07/25/2022   Td 07/02/2002, 09/23/2017, 12/22/2022   Tdap 08/22/2012   Zoster Recombinant(Shingrix) 12/28/2018, 06/08/2019   Zoster, Live 02/10/2010  4. Cervical cancer screening- -still saw Dr. Oscar La before she left but past formal age-based screening recommendations- may need new visit 2 years out from GYN- will be new one with change in insurance 5. Breast cancer screening-  Malignant neoplasm of lower-inner quadrant of left breast in female s/p lumpectomy and reexcision, estrogen receptor positive -patient now off tamoxifen due to recurrent UTI. Close follow-up  Dr. Dwain Sarna released her in 2023 but can come back if desired. Prior saw Dr. Pamelia Hoit with oncology. 12/21/2022 mammogram- and will do annually  6. Colon cancer screening -May 2015 with no recall due to age.  No blood in the stool or melena 7. Skin cancer screening-she saw a new dermatologist starting last year -prior Dr. Jorja Loa.advised regular sunscreen use. Denies worrisome, changing, or new skin lesions.  8. Birth control/STD check-only active with husband and postmenopausal 9. Osteoporosis screening at 65-see below 10. Smoking associated screening -never smoker  Status of chronic or acute concerns   #hyperlipidemia #aortic atherosclerosis- LDL goal under 70 ideally S: Medication:atorvastatin 20 mg  Lab Results  Component Value Date   CHOL 148 07/14/2022   HDL 72.50 07/14/2022    LDLCALC 61 07/14/2022   LDLDIRECT 59.0 10/17/2019   TRIG 71.0 07/14/2022   CHOLHDL 2 07/14/2022  A/P: HLD-hopefully stable- update lipid panel today. Continue current meds for now  Aortic atherosclerosis (presumed stable)- LDL goal ideally <70 - continue current medications if remains at goal on check today  #hypertension with white coat element/PVCs on metoprolol S: medication: irbesartan 150mg ,  metoprolol 50 mg XR, hydrochlorothiazide 12.5 mg Monday Wednesday Friday -Edema on amlodipine and with HCTZ in the past had mild hyponatremia Home readings #s:  home cuff previously verified. 3 readings recently brought and overall ok A/P: well controlled continue current medications   #Chronic kidney disease stage III S: GFR is typically in the 40s range -Patient knows to avoid NSAIDs  -hold irbesartan if gets dehydrated A/P: hopefully stable- update cmp today. No meds  # Asthma/allergies- follows with Dr. [place] Callas S: Maintenance Medication: Virgel Bouquet 200-25 mcg/inh 1 puff dailyand singulair (astelin and flonase and zyrtec as needed as well) As needed medication: albuterol. Patient is using this only when ill.  A/P: well controlled continue current medications    #Osteoporosis S: Medication: Prolia started 08/24/22-had injection in April and October 2024 Last DEXA: repeat 2024 slightly improved but still osteoporosis range  -did not tolerate aledronate when tried- hot flashes and didn't feel well  -compression fracture in lumbar spine with kyphoplasty 05/28/22  Calcium: 1200mg  (through diet ok) recommended -taking Vitamin D: 1000 units a day recommended- she takes this Last vitamin D Lab Results  Component Value Date   VD25OH 55 04/16/2023  A/P: likely stable- continue current medications and update bone density 2026   # B12 deficiency S: Current treatment/medication (oral vs. IM):  b12 every other day now  Lab Results  Component Value Date   VITAMINB12 707 04/16/2023  A/P: doing well-  continue current medications and hold off on check today   #prior fatigue has improved  Recommended follow up: Return in about 6 months (around 01/23/2024) for followup or sooner if needed.Schedule b4 you leave. Future Appointments  Date Time Provider Department Center  08/27/2023  1:40 PM Christell Constant, MD [facility]  12/07/2023  9:15 AM [facility] ANNUAL WELLNESS VISIT 1 [facility]   Lab/Order associations: fasting   ICD-10-CM   1. Preventative health care  Z00.00     2. Hyperlipidemia, unspecified hyperlipidemia type  E78.5 Comprehensive metabolic panel    CBC with Differential/Platelet    Lipid panel    3. Localized osteoporosis with current pathological fracture, sequela  M80.80XS     4. Primary hypertension  I10     5. Stage 3b chronic kidney disease (HCC)  N18.32     6. Low vitamin B12 level  R79.89     7. Aortic atherosclerosis (HCC)  I70.0     8. Overweight  E66.3 Hemoglobin A1c    9. Screening for diabetes mellitus  Z13.1 Hemoglobin A1c      No orders of the defined types were placed in this encounter.   Return precautions advised.  Tana Conch, MD

## 2023-07-30 ENCOUNTER — Other Ambulatory Visit: Payer: Self-pay | Admitting: Internal Medicine

## 2023-08-16 ENCOUNTER — Other Ambulatory Visit: Payer: Self-pay | Admitting: Family Medicine

## 2023-08-16 NOTE — Telephone Encounter (Signed)
 Last Fill: 02/09/23  Last OV: 07/26/23 Next OV: 01/24/24  Routing to provider for review/authorization.

## 2023-08-16 NOTE — Telephone Encounter (Signed)
 Copied from CRM [id number]. Topic: Clinical - Medication Refill >> Aug 16, 2023  4:20 PM Sonny Dandy B wrote: Most Recent Primary Care Visit:  Provider: Shelva Majestic  Department: [facility]  Visit Type: PHYSICAL  Date: 07/26/2023  Medication: denosumab (PROLIA) 60 MG/ML SOSY injection   Has the patient contacted their pharmacy? Yes (Agent: If no, request that the patient contact the pharmacy for the refill. If patient does not wish to contact the pharmacy document the reason why and proceed with request.) (Agent: If yes, when and what did the pharmacy advise?)  Is this the correct pharmacy for this prescription? Yes If no, delete pharmacy and type the correct one.  This is the patient's preferred pharmacy:    [facility], [place] - [address] [place] [address] Phone: [phone] Fax: [phone]   Has the prescription been filled recently? Yes  Is the patient out of the medication? Yes  Has the patient been seen for an appointment in the last year OR does the patient have an upcoming appointment? Yes  Can we respond through MyChart? Yes  Agent: Please be advised that Rx refills may take up to 3 business days. We ask that you follow-up with your pharmacy.

## 2023-08-17 MED ORDER — PROLIA 60 MG/ML ~~LOC~~ SOSY: 60.0000 mg | PREFILLED_SYRINGE | SUBCUTANEOUS | 1 refills | Status: DC

## 2023-08-18 ENCOUNTER — Other Ambulatory Visit: Payer: Self-pay | Admitting: Family Medicine

## 2023-08-18 MED ORDER — PROLIA 60 MG/ML ~~LOC~~ SOSY: 60.0000 mg | PREFILLED_SYRINGE | SUBCUTANEOUS | 1 refills | Status: DC

## 2023-08-18 NOTE — Telephone Encounter (Signed)
 Copied from CRM [id number]. Topic: Clinical - Prescription Issue >> Aug 18, 2023 11:04 AM Lisa Greene wrote: Reason for CRM: Patient states her medication denosumab (PROLIA) 60 MG/ML SOSY injection was sent to the wrong pharmacy - it was supposed to [facility], states it usually gets delivered to the office and she comes in for the nurse to give her the injection. Requesting for the medication to be sent to the correct pharmacy.

## 2023-08-23 ENCOUNTER — Ambulatory Visit (INDEPENDENT_AMBULATORY_CARE_PROVIDER_SITE_OTHER)

## 2023-08-23 ENCOUNTER — Encounter: Payer: Self-pay | Admitting: Podiatry

## 2023-08-23 ENCOUNTER — Telehealth: Payer: Self-pay

## 2023-08-23 ENCOUNTER — Ambulatory Visit: Admitting: Podiatry

## 2023-08-23 DIAGNOSIS — M2040 Other hammer toe(s) (acquired), unspecified foot: Secondary | ICD-10-CM

## 2023-08-23 DIAGNOSIS — M7752 Other enthesopathy of left foot: Secondary | ICD-10-CM

## 2023-08-23 DIAGNOSIS — M79672 Pain in left foot: Secondary | ICD-10-CM

## 2023-08-23 DIAGNOSIS — M2041 Other hammer toe(s) (acquired), right foot: Secondary | ICD-10-CM | POA: Diagnosis not present

## 2023-08-23 MED ORDER — TRIAMCINOLONE ACETONIDE 10 MG/ML IJ SUSP
10.0000 mg | Freq: Once | INTRAMUSCULAR | Status: AC
  Administered 2023-08-23: 10 mg via INTRA_ARTICULAR

## 2023-08-23 NOTE — Telephone Encounter (Signed)
 Called and lm on pt vm making aware that her pharmacy can ship her prolia anytime M-F from 8am-5pm.  Copied from CRM [id number]. Topic: Clinical - Medication Question >> Aug 20, 2023 12:47 PM Deaijah H wrote: Reason for CRM: Patient stated pharmacy stated they would like to know when denosumab (PROLIA) 60 MG/ML SOSY injection can be shipped to the office . Please call [phone]

## 2023-08-24 ENCOUNTER — Encounter: Payer: Self-pay | Admitting: Family Medicine

## 2023-08-24 ENCOUNTER — Ambulatory Visit (INDEPENDENT_AMBULATORY_CARE_PROVIDER_SITE_OTHER): Admitting: Family Medicine

## 2023-08-24 VITALS — BP 162/86 | HR 69 | Temp 97.5°F | Resp 16 | Ht 61.0 in | Wt 153.4 lb

## 2023-08-24 DIAGNOSIS — R300 Dysuria: Secondary | ICD-10-CM | POA: Diagnosis not present

## 2023-08-24 DIAGNOSIS — N3091 Cystitis, unspecified with hematuria: Secondary | ICD-10-CM | POA: Diagnosis not present

## 2023-08-24 DIAGNOSIS — I10 Essential (primary) hypertension: Secondary | ICD-10-CM | POA: Diagnosis not present

## 2023-08-24 LAB — POC URINALSYSI DIPSTICK (AUTOMATED)
Bilirubin, UA: NEGATIVE
Blood, UA: POSITIVE
Glucose, UA: NEGATIVE
Ketones, UA: NEGATIVE
Nitrite, UA: NEGATIVE
Protein, UA: NEGATIVE
Spec Grav, UA: 1.01 (ref 1.010–1.025)
Urobilinogen, UA: 0.2 U/dL
pH, UA: 6 (ref 5.0–8.0)

## 2023-08-24 MED ORDER — CEFUROXIME AXETIL 250 MG PO TABS: 250.0000 mg | ORAL_TABLET | Freq: Two times a day (BID) | ORAL | 0 refills | Status: DC

## 2023-08-24 NOTE — Progress Notes (Signed)
 Subjective:     Patient ID: Lisa Greene, female    DOB: 12/08/1940, 83 y.o.   MRN: [id number]  Chief Complaint  Patient presents with   Dsyuria    Sx started about 1 week ago, off and on    HPI Discussed the use of AI scribe software for clinical note transcription with the patient, who gave verbal consent to proceed. Something happened and AI note lost.  Needed to recreate note to best of my abilty For 1 wk, some lower pelvic pain when needs to urinate.  + dysuria.  Going smaller amounts w/some dribbling.  Some urgency.   No f/c.  No n/v/d/c.  No back pain.  Was getting some better, but then worse again end of last wk.  Didn't want to go to UC so sch here.    Htn-bp's usually fine at home.  Elevated here today.  Pt concerned about husband's sugar dropped, and then some "white coat".        There are no preventive care reminders to display for this patient.  Past Medical History:  Diagnosis Date   ALLERGIC RHINITIS 01/31/2008   Allergy 1960   Asthma 2019   BCC (basal cell carcinoma)sup& nod 03/01/2017   right nostril   Breast cancer of lower-inner quadrant of left female breast (HCC) 11/01/2015   treated with lumpectomy and radiation   Cataract    Colon cancer screening 05/21/2014   No polyps at age 40. Diverticulosis alone-no more colonoscopies.     DIVERTICULITIS, HX OF 09/07/2008   pt denies this    Eosinophilia 02/08/2009   Fibroid    Heart murmur    HYPERLIPIDEMIA 12/20/2006   Hypertension    OSTEOPOROSIS 12/20/2006   PVC's (premature ventricular contractions)    SCC (squamous cell carcinoma) well diff 11/28/2013   left side chin   Vitamin B 12 deficiency     Past Surgical History:  Procedure Laterality Date   BREAST LUMPECTOMY WITH NEEDLE LOCALIZATION Left 01/03/2016   Procedure: BREAST re-excision LUMPECTOMY WITH NEEDLE LOCALIZATION;  Surgeon: Emelia Loron, MD;  Location: [facility];  Service: General;  Laterality: Left;  BREAST  re-excision LUMPECTOMY WITH NEEDLE LOCALIZATION   BREAST LUMPECTOMY WITH RADIOACTIVE SEED LOCALIZATION Left 11/28/2015   Procedure: LEFT BREAST LUMPECTOMY WITH BRACKETED RADIOACTIVE SEED LOCALIZATION;  Surgeon: Emelia Loron, MD;  Location: [facility];  Service: General;  Laterality: Left;   BREAST SURGERY  2017   cysts removed x2   CATARACT EXTRACTION BILATERAL W/ ANTERIOR VITRECTOMY  2013   bilateral cataracts   COLONOSCOPY  2005   tics only    FOOT SURGERY     IR KYPHO LUMBAR INC FX REDUCE BONE BX UNI/BIL CANNULATION INC/IMAGING  05/28/2022   IR RADIOLOGIST EVAL & MGMT  05/21/2022   KNEE ARTHROSCOPY     left   NEUROMA SURGERY     x2 feet   thyroid duct cyst       Current Outpatient Medications:    Albuterol Sulfate (PROAIR RESPICLICK) 108 (90 Base) MCG/ACT AEPB, Inhale 1 puff into the lungs every 8 (eight) hours as needed (wheezing/shortness of breath)., Disp: , Rfl:    atorvastatin (LIPITOR) 20 MG tablet, Take 1 tablet (20 mg total) by mouth daily., Disp: 90 tablet, Rfl: 3   azelastine (OPTIVAR) 0.05 % ophthalmic solution, , Disp: , Rfl:    BREO ELLIPTA 200-25 MCG/INH AEPB, Inhale 1 puff into the lungs daily., Disp: , Rfl:    cefUROXime (CEFTIN)  250 MG tablet, Take 1 tablet (250 mg total) by mouth 2 (two) times daily with a meal., Disp: 14 tablet, Rfl: 0   cetirizine (ZYRTEC) 10 MG tablet, Take 10 mg by mouth daily., Disp: , Rfl:    cholecalciferol (VITAMIN D) 1000 UNITS tablet, Take 2,000 Units by mouth daily., Disp: , Rfl:    Cyanocobalamin (VITAMIN B 12 PO), Take 1,000 mg by mouth every other day., Disp: , Rfl:    denosumab (PROLIA) 60 MG/ML SOSY injection, INJECT 60 MG INTO THE SKIN EVERY 6 (SIX) MONTHS., Disp: 1 mL, Rfl: 1   fluticasone (FLONASE) 50 MCG/ACT nasal spray, Place 1 spray into both nostrils daily as needed. , Disp: , Rfl:    hydrochlorothiazide (MICROZIDE) 12.5 MG capsule, TAKE 1 CAPSULE BY MOUTH ON MONDAY WEDNESDAY AND FRIDAY AS DIRECTED, Disp:  36 capsule, Rfl: 0   irbesartan (AVAPRO) 150 MG tablet, TAKE ONE TABLET BY MOUTH DAILY, Disp: 90 tablet, Rfl: 3   metoprolol succinate (TOPROL-XL) 50 MG 24 hr tablet, Take 1 tablet (50 mg total) by mouth daily., Disp: 90 tablet, Rfl: 3   montelukast (SINGULAIR) 10 MG tablet, Take 1 tablet by mouth at bedtime., Disp: , Rfl:   Allergies  Allergen Reactions   Lisinopril Other (See Comments) and Cough    Dry cough and stinging rash   Latex Rash   Penicillins Rash    Has patient had a PCN reaction causing immediate rash, facial/tongue/throat swelling, SOB or lightheadedness with hypotension: Yes Has patient had a PCN reaction causing severe rash involving mucus membranes or skin necrosis: No Has patient had a PCN reaction that required hospitalization No Has patient had a PCN reaction occurring within the last 10 years: No If all of the above answers are "NO", then may proceed with Cephalosporin use.    Sulfonamide Derivatives Rash   Tape Rash    Band-aids break out skin!!   ROS neg/noncontributory except as noted HPI/below      Objective:     BP (!) 162/86 (BP Location: Left Arm, Patient Position: Sitting, Cuff Size: Normal)   Pulse 69   Temp (!) 97.5 F (36.4 C) (Temporal)   Resp 16   Ht 5\' 1"  (1.549 m)   Wt 153 lb 6 oz (69.6 kg)   SpO2 99%   BMI 28.98 kg/m  Wt Readings from Last 3 Encounters:  08/24/23 153 lb 6 oz (69.6 kg)  07/26/23 153 lb (69.4 kg)  05/27/23 154 lb 3.2 oz (69.9 kg)    Physical Exam   Gen: WDWN NAD HEENT: NCAT, conjunctiva not injected, sclera nonicteric CARDIAC: RRR, S1S2+r.  LUNGS: CTAB. No wheezes ABDOMEN:  BS+, soft, mild suprapubic tenderness, No HSM, no masses.  No cvat EXT:  no edema MSK: no gross abnormalities.  NEURO: A&O x3.  CN II-XII intact.  PSYCH: normal mood. Good eye contact Results for orders placed or performed in visit on 08/24/23  POCT Urinalysis Dipstick (Automated)   Collection Time: 08/24/23  4:21 PM  Result Value Ref  Range   Color, UA YELLOW    Clarity, UA CLEAR    Glucose, UA Negative Negative   Bilirubin, UA NEG    Ketones, UA NEG    Spec Grav, UA 1.010 1.010 - 1.025   Blood, UA POS    pH, UA 6.0 5.0 - 8.0   Protein, UA Negative Negative   Urobilinogen, UA 0.2 0.2 or 1.0 E.U./dL   Nitrite, UA NEG    Leukocytes, UA Moderate (2+) (A)  Negative        Assessment & Plan:  Cystitis with hematuria  Dysuria -     POCT Urinalysis Dipstick (Automated) -     Urine Culture  Primary hypertension  Other orders -     Cefuroxime Axetil; Take 1 tablet (250 mg total) by mouth 2 (two) times daily with a meal.  Dispense: 14 tablet; Refill: 0  Assessment and Plan UTI-reviewed old labs and micro.  Not resolve on keflex in June and needed cipro.  Will start w/ceftin 250mg  bid.    HTN-bp's elevated today.  Had been controlled.  Not sure if from UTI or other.  Advised to monitor  Return if symptoms worsen or fail to improve.  Angelena Sole, MD

## 2023-08-24 NOTE — Progress Notes (Signed)
 Subjective:   Patient ID: Lisa Greene, female   DOB: 83 y.o.   MRN: [id number]   HPI Patient presents with 2 separate problems with 1 being pain in the left ankle that is been present for around a month and a long-term hammertoe deformity second right with concerns about worsening of the condition.  Patient states having trouble ambulating left ankle and does not smoke likes to be active   Review of Systems  All other systems reviewed and are negative.       Objective:  Physical Exam Vitals and nursing note reviewed.  Constitutional:      Appearance: She is well-developed.  Pulmonary:     Effort: Pulmonary effort is normal.  Musculoskeletal:        General: Normal range of motion.  Skin:    General: Skin is warm.  Neurological:     Mental Status: She is alert.     Neurovascular status was found to be intact muscle strength is adequate range of motion reduced subtalar joint left mostly due to splinting because of pain with patient found to have rigid contracture digit to right and inflammation pain of the sinus tarsi left with fluid buildup within the joint.  Good digital perfusion well-oriented     Assessment:  Inflammatory capsulitis of the sinus tarsi left with fluid buildup along with hammertoe deformity second digit right long-term duration     Plan:  H&P reviewed both conditions.  For the left sterile prep and did inject the sinus tarsi 3 mg Kenalog 5 mg Xylocaine and applied sterile dressing.  For the right discussed the procedure and it could be fixed but would require surgery and at this point we will just get a try cushioning which was dispensed for her to utilize.  Also shoe gear modifications  X-rays indicate no signs of arthritis of the subtalar joint left moderate elevation second digit right with consistent digital contracture

## 2023-08-24 NOTE — Patient Instructions (Addendum)
 It was very nice to see you today!  Meds sent to pharmacy.   PLEASE NOTE:  If you had any lab tests please let us know if you have not heard back within a few days. You may see your results on MyChart before we have a chance to review them but we will give you a call once they are reviewed by [place]. If we ordered any referrals today, please let us know if you have not heard from their office within the next week.   Please try these tips to maintain a healthy lifestyle:  Eat most of your calories during the day when you are active. Eliminate processed foods including packaged sweets (pies, cakes, cookies), reduce intake of potatoes, white bread, white pasta, and white rice. Look for whole grain options, oat flour or almond flour.  Each meal should contain half fruits/vegetables, one quarter protein, and one quarter carbs (no bigger than a computer mouse).  Cut down on sweet beverages. This includes juice, soda, and sweet tea. Also watch fruit intake, though this is a healthier sweet option, it still contains natural sugar! Limit to 3 servings daily.  Drink at least 1 glass of water with each meal and aim for at least 8 glasses per day  Exercise at least 150 minutes every week.

## 2023-08-26 ENCOUNTER — Encounter: Payer: Self-pay | Admitting: Family Medicine

## 2023-08-26 LAB — URINE CULTURE
MICRO NUMBER:: 16244766
SPECIMEN QUALITY:: ADEQUATE

## 2023-08-26 NOTE — Progress Notes (Signed)
 The cefuroxime should help.  How is she doing?

## 2023-08-27 ENCOUNTER — Ambulatory Visit: Payer: Medicare Other | Attending: Internal Medicine | Admitting: Internal Medicine

## 2023-08-27 ENCOUNTER — Telehealth: Payer: Self-pay

## 2023-08-27 VITALS — BP 142/80 | HR 61 | Ht 62.0 in | Wt 154.0 lb

## 2023-08-27 DIAGNOSIS — I340 Nonrheumatic mitral (valve) insufficiency: Secondary | ICD-10-CM | POA: Diagnosis not present

## 2023-08-27 DIAGNOSIS — I10 Essential (primary) hypertension: Secondary | ICD-10-CM

## 2023-08-27 DIAGNOSIS — I700 Atherosclerosis of aorta: Secondary | ICD-10-CM

## 2023-08-27 DIAGNOSIS — I493 Ventricular premature depolarization: Secondary | ICD-10-CM

## 2023-08-27 NOTE — Telephone Encounter (Signed)
 Pt needing PA for prolia.  Copied from CRM [id number]. Topic: Referral - Prior Authorization Question >> Aug 27, 2023 12:39 PM Pascal Lux wrote: Reason for CRM: Patient stated that her insurance company no longer cover prolia shots and so she will need a prior authorization sen to [employer].

## 2023-08-27 NOTE — Progress Notes (Signed)
 Cardiology Office Note:    Date:  08/27/2023   ID:  EMAN RYNDERS, DOB [birth year], MRN [id number]  PCP:  Lisa Majestic, MD   [facility] Providers Cardiologist:  Lisa Noe, MD (Inactive)     Referring MD: Lisa Majestic, MD   CC: Follow up visit  History of Present Illness:    Lisa Greene is a 83 y.o. female with a hx of HLD with Aortic atherosclerosis and frequent asymptomatic PVCs.  Complicated by hypotension. 2024: Asymptomatic:  BP cuff elevated.  White Coat HTN.  Loves to bake (at our index visit made pound cake).  Lisa Greene "[name]" is an 83 year old female with non-rheumatic mitral regurgitation who presents for a cardiology follow-up.  She has a history of non-rheumatic mitral regurgitation. In the past, a heart murmur was noted during a cardiology visit, although it was not detected by her primary care physician. An echocardiogram in 2021-2022 showed mild mitral regurgitation. She is currently asymptomatic with no palpitations, shortness of breath, or dizziness during physical activity.  She experiences frequent premature ventricular contractions (PVCs) that began following radiation therapy for breast cancer. These PVCs are currently asymptomatic. An echocardiogram in 2022 showed normal heart function despite the presence of frequent PVCs.  She has white coat hypertension, with blood pressures typically well-controlled at home, averaging under 130/80 mmHg on her current medications. She reports occasional elevated readings, but overall, her blood pressure is stable.  She has hyperlipidemia and a history of aortic atherosclerosis. Her LDL levels are currently under 70 mg/dL. She maintains a healthy lifestyle, including regular exercise on an exercise bike for 40-50 minutes, five days a week. She takes a low dose of cholesterol medication, approximately 25 mg.  She has a history of breast cancer, specifically carcinoma in situ, which was  treated with surgery and radiation. There is no current evidence of active disease.  She maintains an active lifestyle, exercising regularly on an exercise bike for 40-50 minutes, five days a week, and follows a healthy diet with limited beef intake.  Past Medical History:  Diagnosis Date   ALLERGIC RHINITIS 01/31/2008   Allergy 1960   Asthma 2019   BCC (basal cell carcinoma)sup& nod 03/01/2017   right nostril   Breast cancer of lower-inner quadrant of left female breast (HCC) 11/01/2015   treated with lumpectomy and radiation   Cataract    Colon cancer screening 05/21/2014   No polyps at age 31. Diverticulosis alone-no more colonoscopies.     DIVERTICULITIS, HX OF 09/07/2008   pt denies this    Eosinophilia 02/08/2009   Fibroid    Heart murmur    HYPERLIPIDEMIA 12/20/2006   Hypertension    OSTEOPOROSIS 12/20/2006   PVC's (premature ventricular contractions)    SCC (squamous cell carcinoma) well diff 11/28/2013   left side chin   Vitamin B 12 deficiency     Past Surgical History:  Procedure Laterality Date   BREAST LUMPECTOMY WITH NEEDLE LOCALIZATION Left 01/03/2016   Procedure: BREAST re-excision LUMPECTOMY WITH NEEDLE LOCALIZATION;  Surgeon: Emelia Loron, MD;  Location: [facility];  Service: General;  Laterality: Left;  BREAST re-excision LUMPECTOMY WITH NEEDLE LOCALIZATION   BREAST LUMPECTOMY WITH RADIOACTIVE SEED LOCALIZATION Left 11/28/2015   Procedure: LEFT BREAST LUMPECTOMY WITH BRACKETED RADIOACTIVE SEED LOCALIZATION;  Surgeon: Emelia Loron, MD;  Location: [facility];  Service: General;  Laterality: Left;   BREAST SURGERY  2017   cysts removed x2  CATARACT EXTRACTION BILATERAL W/ ANTERIOR VITRECTOMY  2013   bilateral cataracts   COLONOSCOPY  2005   tics only    FOOT SURGERY     IR KYPHO LUMBAR INC FX REDUCE BONE BX UNI/BIL CANNULATION INC/IMAGING  05/28/2022   IR RADIOLOGIST EVAL & MGMT  05/21/2022   KNEE ARTHROSCOPY      left   NEUROMA SURGERY     x2 feet   thyroid duct cyst      Current Medications: Current Meds  Medication Sig   Albuterol Sulfate (PROAIR RESPICLICK) 108 (90 Base) MCG/ACT AEPB Inhale 1 puff into the lungs every 8 (eight) hours as needed (wheezing/shortness of breath).   atorvastatin (LIPITOR) 20 MG tablet Take 1 tablet (20 mg total) by mouth daily.   azelastine (OPTIVAR) 0.05 % ophthalmic solution    BREO ELLIPTA 200-25 MCG/INH AEPB Inhale 1 puff into the lungs daily.   cefUROXime (CEFTIN) 250 MG tablet Take 1 tablet (250 mg total) by mouth 2 (two) times daily with a meal.   cetirizine (ZYRTEC) 10 MG tablet Take 10 mg by mouth daily.   cholecalciferol (VITAMIN D) 1000 UNITS tablet Take 2,000 Units by mouth daily.   Cyanocobalamin (VITAMIN B 12 PO) Take 1,000 mg by mouth every other day.   denosumab (PROLIA) 60 MG/ML SOSY injection INJECT 60 MG INTO THE SKIN EVERY 6 (SIX) MONTHS.   fluticasone (FLONASE) 50 MCG/ACT nasal spray Place 1 spray into both nostrils daily as needed.    hydrochlorothiazide (MICROZIDE) 12.5 MG capsule TAKE 1 CAPSULE BY MOUTH ON MONDAY WEDNESDAY AND FRIDAY AS DIRECTED   irbesartan (AVAPRO) 150 MG tablet TAKE ONE TABLET BY MOUTH DAILY   metoprolol succinate (TOPROL-XL) 50 MG 24 hr tablet Take 1 tablet (50 mg total) by mouth daily.   montelukast (SINGULAIR) 10 MG tablet Take 1 tablet by mouth at bedtime.     Allergies:   Lisinopril, Latex, Penicillins, Sulfonamide derivatives, and Tape   Social History   Socioeconomic History   Marital status: Married    Spouse name: Not on file   Number of children: Not on file   Years of education: Not on file   Highest education level: Not on file  Occupational History   Occupation: retired  Tobacco Use   Smoking status: Never   Smokeless tobacco: Never  Vaping Use   Vaping status: Never Used  Substance and Sexual Activity   Alcohol use: No    Alcohol/week: 0.0 standard drinks of alcohol   Drug use: No   Sexual  activity: Not Currently    Partners: Male    Birth control/protection: Abstinence, Post-menopausal, None  Other Topics Concern   Not on file  Social History Narrative   Married. No kids- 2 step kids. 5 step grandkids.  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[employer])      Hobbies: beach, read, crochet, knit, watch tv   Social Drivers of Health   Financial Resource Strain: Low Risk  (11/26/2022)   Overall Financial Resource Strain (CARDIA)    Difficulty of Paying Living Expenses: Not hard at all  Food Insecurity: No Food Insecurity (11/26/2022)   Hunger Vital Sign    Worried About Running Out of Food in the Last Year: Never true    Ran Out of Food in the Last Year: Never true  Transportation Needs: No Transportation Needs (11/26/2022)   PRAPARE - Administrator, Civil Service (Medical): No  Lack of Transportation (Non-Medical): No  Physical Activity: Sufficiently Active (11/26/2022)   Exercise Vital Sign    Days of Exercise per Week: 6 days    Minutes of Exercise per Session: 30 min  Stress: No Stress Concern Present (11/26/2022)   [employer] of Occupational Health - Occupational Stress Questionnaire    Feeling of Stress : Not at all  Social Connections: Moderately Integrated (11/26/2022)   Social Connection and Isolation Panel [NHANES]    Frequency of Communication with Friends and Family: Twice a week    Frequency of Social Gatherings with Friends and Family: More than three times a week    Attends Religious Services: More than 4 times per year    Active Member of [employer] or Organizations: No    Attends Engineer, structural: Never    Marital Status: Married    Family History: The patient's family history includes Allergic rhinitis in her father; Arthritis in her brother; Brain cancer in her mother; COPD in her brother and father; Healthy in her brother; Heart disease in her father. There is no history of Colon cancer  or Stomach cancer.  ROS:   Please see the history of present illness.     EKGs/Labs/Other Studies Reviewed:     Cardiac Studies & Procedures   ______________________________________________________________________________________________   STRESS TESTS  MYOCARDIAL PERFUSION IMAGING 08/05/2020  Narrative  The left ventricular ejection fraction is moderately decreased (30-44%).  Nuclear stress EF: 38%.  Blood pressure demonstrated a normal response to exercise.  Upsloping ST segment depression ST segment depression of 1 mm was noted during stress in the II, III, aVF, V5, V6 and V4 leads.  This is an intermediate risk study.  Normal perfusion. LVEF 38% with global hypokinesis. Frequent PVC's, however, were noted with exercise and may have adversely affected gating leading to possibly a falsely low LVEF- echo correlation is recommended. Moderately impaired exercise tolerance. Overall an intermediate risk study, but no ischemia. No prior for comparison.   ECHOCARDIOGRAM  ECHOCARDIOGRAM COMPLETE 07/01/2020  Narrative ECHOCARDIOGRAM REPORT    Patient Name:   Lisa Greene Date of Exam: 07/01/2020 Medical Rec #:  [id number]       Height:       60.0 in Accession #:    [id number]      Weight:       151.2 lb Date of Birth:  04/07/1941       BSA:          1.657 m Patient Age:    79 years        BP:           154/82 mmHg Patient Gender: F               HR:           62 bpm. Exam Location:  [address]  Procedure: 2D Echo, Cardiac Doppler and Color Doppler  Indications:    I49.3 PVC.  History:        Patient has prior history of Echocardiogram examinations, most recent 11/26/2016. Arrythmias:PVC, Signs/Symptoms:Murmur; Risk Factors:Hypertension and Dyslipidemia. CKD stage 3. H/o breast cancer.  Sonographer:    Garald Braver, RDCS Referring Phys: [phone] [facility]  IMPRESSIONS   1. Infero basal hypokinesis . Left ventricular ejection fraction, by estimation, is 50 to  55%. The left ventricle has low normal function. The left ventricle demonstrates regional wall motion abnormalities (see scoring diagram/findings for description). Left ventricular diastolic parameters are consistent with Grade II  diastolic dysfunction (pseudonormalization). Elevated left ventricular end-diastolic pressure. 2. Right ventricular systolic function is normal. The right ventricular size is normal. There is normal pulmonary artery systolic pressure. 3. Left atrial size was mildly dilated. 4. The mitral valve is degenerative. Mild mitral valve regurgitation. No evidence of mitral stenosis. 5. The aortic valve is tricuspid. Aortic valve regurgitation is not visualized. No aortic stenosis is present. 6. The inferior vena cava is normal in size with greater than 50% respiratory variability, suggesting right atrial pressure of 3 mmHg.  Comparison(s): 11/26/16 EF 60-65%. PA pressure .  FINDINGS Left Ventricle: Infero basal hypokinesis. Left ventricular ejection fraction, by estimation, is 50 to 55%. The left ventricle has low normal function. The left ventricle demonstrates regional wall motion abnormalities. The left ventricular internal cavity size was normal in size. There is no left ventricular hypertrophy. Left ventricular diastolic parameters are consistent with Grade II diastolic dysfunction (pseudonormalization). Elevated left ventricular end-diastolic pressure.  Right Ventricle: The right ventricular size is normal. No increase in right ventricular wall thickness. Right ventricular systolic function is normal. There is normal pulmonary artery systolic pressure. The tricuspid regurgitant velocity is 2.64 m/s, and with an assumed right atrial pressure of 8 mmHg, the estimated right ventricular systolic pressure is 35.9 mmHg.  Left Atrium: Left atrial size was mildly dilated.  Right Atrium: Right atrial size was normal in size.  Pericardium: There is no evidence of pericardial  effusion.  Mitral Valve: The mitral valve is degenerative in appearance. There is mild thickening of the mitral valve leaflet(s). There is mild calcification of the mitral valve leaflet(s). Mild mitral annular calcification. Mild mitral valve regurgitation. No evidence of mitral valve stenosis.  Tricuspid Valve: The tricuspid valve is normal in structure. Tricuspid valve regurgitation is mild . No evidence of tricuspid stenosis.  Aortic Valve: The aortic valve is tricuspid. Aortic valve regurgitation is not visualized. No aortic stenosis is present.  Pulmonic Valve: The pulmonic valve was normal in structure. Pulmonic valve regurgitation is not visualized. No evidence of pulmonic stenosis.  Aorta: The aortic root is normal in size and structure.  Venous: The inferior vena cava is normal in size with greater than 50% respiratory variability, suggesting right atrial pressure of 3 mmHg.  IAS/Shunts: No atrial level shunt detected by color flow Doppler.   LEFT VENTRICLE PLAX 2D LVIDd:         4.70 cm  Diastology LVIDs:         3.20 cm  LV e' medial:    3.31 cm/s LV PW:         0.80 cm  LV E/e' medial:  14.2 LV IVS:        0.80 cm  LV e' lateral:   3.12 cm/s LVOT diam:     2.00 cm  LV E/e' lateral: 15.1 LV SV:         47 LV SV Index:   28 LVOT Area:     3.14 cm   RIGHT VENTRICLE RV Basal diam:  2.50 cm RV S prime:     10.80 cm/s TAPSE (M-mode): 2.1 cm RVSP:           35.9 mmHg  LEFT ATRIUM             Index       RIGHT ATRIUM           Index LA diam:        4.00 cm 2.41 cm/m  RA Pressure: 8.00 mmHg LA Vol (A2C):  42.9 ml 25.88 ml/m RA Area:     9.01 cm LA Vol (A4C):   48.8 ml 29.44 ml/m RA Volume:   14.90 ml  8.99 ml/m LA Biplane Vol: 45.9 ml 27.69 ml/m AORTIC VALVE LVOT Vmax:   58.75 cm/s LVOT Vmean:  37.400 cm/s LVOT VTI:    0.149 m  AORTA Ao Root diam: 3.00 cm Ao Asc diam:  2.80 cm  MITRAL VALVE               TRICUSPID VALVE TR Peak grad:   27.9 mmHg TR Vmax:         264.00 cm/s MV E velocity: 47.00 cm/s  Estimated RAP:  8.00 mmHg MV A velocity: 93.00 cm/s  RVSP:           35.9 mmHg MV E/A ratio:  0.51 SHUNTS Systemic VTI:  0.15 m Systemic Diam: 2.00 cm  Charlton Haws MD Electronically signed by Charlton Haws MD Signature Date/Time: 07/01/2020/5:26:55 PM    Final    MONITORS  CARDIAC EVENT MONITOR 05/16/2020  Narrative  Normal sinus rhythm and sinus bradycardia. HR range 48-114 bpm, ave 74 bpm  Frequent PVC's occasionally bigeminy and trigeminy.  PVC burden 15%  No atrial fibrillation.  C/O flutter and skipped beat is related to PVC's  Compared to prior monitor 11/2016, PVC burden has decreased. No atrial fibrillation or ventricular tachycardia.  We should consider EP consult if still symptomatic.       ______________________________________________________________________________________________       Recent Labs: 04/16/2023: TSH 1.92 07/26/2023: ALT 12; BUN 16; Creatinine, Ser 1.15; Hemoglobin 13.2; Platelets 313.0; Potassium 4.0; Sodium 138  Recent Lipid Panel    Component Value Date/Time   CHOL 163 07/26/2023 0935   TRIG 96.0 07/26/2023 0935   HDL 76.50 07/26/2023 0935   CHOLHDL 2 07/26/2023 0935   VLDL 19.2 07/26/2023 0935   LDLCALC 68 07/26/2023 0935   LDLCALC 58 04/02/2020 1138   LDLDIRECT 59.0 10/17/2019 0930        Physical Exam:    VS:  BP (!) 142/80 (BP Location: Left Arm)   Pulse 61   Ht 5\' 2"  (1.575 m)   Wt 69.9 kg   SpO2 98%   BMI 28.17 kg/m     Wt Readings from Last 3 Encounters:  08/27/23 69.9 kg  08/24/23 69.6 kg  07/26/23 69.4 kg    GEN:  Well nourished, well developed in no acute distress HEENT: Normal NECK: No JVD CARDIAC: Rare irregular beat, no rubs, gallops, soft holosystolic murmur RESPIRATORY:  Clear to auscultation without rales, wheezing or rhonchi  ABDOMEN: Soft, non-tender, non-distended MUSCULOSKELETAL:  no edema; No deformity  SKIN: Warm and dry NEUROLOGIC:   Alert and oriented x 3 PSYCHIATRIC:  Normal affect   ASSESSMENT:    1. Primary hypertension   2. Aortic atherosclerosis (HCC)   3. PVC (premature ventricular contraction)     PLAN:    Non-rheumatic mitral regurgitation Mild mitral regurgitation with a newly detected heart murmur. Previous echocardiogram in 2021 or 2022. Clinical guidelines recommend echocardiogram every three years for mild valve disease. - Order echocardiogram to assess mitral regurgitation  Premature ventricular contractions (PVCs) Frequent asymptomatic PVCs, with 14-15% being asymptomatic. No evidence of congestive heart failure, normal heart function on 2022 echocardiogram. Asymptomatic with no palpitations, dyspnea, or syncope during exercise. Possibly related to previous radiation therapy. - Continue current management as she is asymptomatic  Hyperlipidemia and aortic atherosclerosis LDL cholesterol well-controlled under 70 mg/dL. Aortic atherosclerosis with plaque  buildup on previous CT scans. Radiation therapy as a risk factor for future cardiovascular events. Aggressive hyperlipidemia management due to these risk factors. - Continue current lipid management  White coat hypertension Blood pressures well-controlled at home, averaging under 130/80 mmHg on current medications. Elevated readings in clinical settings. Current management effective. - Continue current management as blood pressures are well-controlled at home  Ductal carcinoma in situ Treated with surgical excision and radiation therapy. Radiation therapy may have contributed to PVCs and increased risk for HLD.   Follow-up Overall well-being with no significant symptoms or concerns. Active, exercises regularly, and feels fine. Prefers to space out follow-up visits unless needed sooner. If echocardiogram results are normal, follow-up can be extended to every two years. - Schedule follow-up in two years unless new symptoms arise - Communicate  echocardiogram results to her - Advise her to contact the office if any new symptoms or concerns arise        Medication Adjustments/Labs and Tests Ordered: Current medicines are reviewed at length with the patient today.  Concerns regarding medicines are outlined above.  No orders of the defined types were placed in this encounter.  No orders of the defined types were placed in this encounter.   There are no Patient Instructions on file for this visit.   Signed, Christell Constant, MD  08/27/2023 2:08 PM    [facility]

## 2023-08-27 NOTE — Patient Instructions (Signed)
 Medication Instructions:  Your physician recommends that you continue on your current medications as directed. Please refer to the Current Medication list given to you today.  *If you need a refill on your cardiac medications before your next appointment, please call your pharmacy*  Lab Work: NONE  If you have labs (blood work) drawn today and your tests are completely normal, you will receive your results only by: MyChart Message (if you have MyChart) OR A paper copy in the mail If you have any lab test that is abnormal or we need to change your treatment, we will call you to review the results.  Testing/Procedures: Your physician has requested that you have an echocardiogram. Echocardiography is a painless test that uses sound waves to create images of your heart. It provides your doctor with information about the size and shape of your heart and how well your heart's chambers and valves are working. This procedure takes approximately one hour. There are no restrictions for this procedure. Please do NOT wear cologne, perfume, aftershave, or lotions (deodorant is allowed). Please arrive 15 minutes prior to your appointment time.  Please note: We ask at that you not bring children with you during ultrasound (echo/ vascular) testing. Due to room size and safety concerns, children are not allowed in the ultrasound rooms during exams. Our front office staff cannot provide observation of children in our lobby area while testing is being conducted. An adult accompanying a patient to their appointment will only be allowed in the ultrasound room at the discretion of the ultrasound technician under special circumstances. We apologize for any inconvenience.   Follow-Up: At [facility], you and your health needs are our priority.  As part of our continuing mission to provide you with exceptional heart care, our providers are all part of one team.  This team includes your primary Cardiologist  (physician) and Advanced Practice Providers or APPs (Physician Assistants and Nurse Practitioners) who all work together to provide you with the care you need, when you need it.  Your next appointment:   2 year(s)  Provider:   Christell Constant, MD     Other Instructions       1st Floor: - Lobby - Registration  - Pharmacy  - Lab - Cafe  2nd Floor: - PV Lab - Diagnostic Testing (echo, CT, nuclear med)  3rd Floor: - Vacant  4th Floor: - TCTS (cardiothoracic surgery) - AFib Clinic - Structural Heart Clinic - Vascular Surgery  - Vascular Ultrasound  5th Floor: - HeartCare Cardiology (general and EP) - Clinical Pharmacy for coumadin, hypertension, lipid, weight-loss medications, and med management appointments    Valet parking services will be available as well.

## 2023-08-30 ENCOUNTER — Telehealth: Payer: Self-pay

## 2023-08-30 ENCOUNTER — Other Ambulatory Visit (HOSPITAL_COMMUNITY): Payer: Self-pay

## 2023-08-30 NOTE — Telephone Encounter (Signed)
 Prolia VOB initiated via [email or web address]  Next Prolia inj DUE: 09/14/23

## 2023-08-30 NOTE — Telephone Encounter (Signed)
 FYI

## 2023-08-30 NOTE — Telephone Encounter (Signed)
 Marland Kitchen

## 2023-08-30 NOTE — Telephone Encounter (Signed)
 Pt ready for scheduling for Prolia on or after : 09/14/23  Option# 1: Buy/Bill (Office supplied medication)  Out-of-pocket cost due at time of clinic visit: $331  Number of injection/visits approved: --  Primary: [employer] of [place] - Medicare Prolia co-insurance: 20% Admin fee co-insurance: 100%  Secondary: N/A Prolia co-insurance:  Admin fee co-insurance:   Medical Benefit Details: Date Benefits were checked: 08/30/23 Deductible: no/ Coinsurance: 20%/ Admin Fee: 100%  Prior Auth: not required PA# Expiration Date:   # of doses approved: ----------------------------------------------------------------------- Option# 2- Med Obtained from pharmacy:  Pharmacy benefit: Copay $297 (Paid to pharmacy) Admin Fee: 100% (Pay at clinic)  Prior Auth: not required PA# Expiration Date:   # of doses approved:   If patient wants fill through the pharmacy benefit please send prescription to:  [facility] , and include estimated need by date in rx notes. Pharmacy will ship medication directly to the office.  Patient not eligible for Prolia Copay Card. Copay Card can make patient's cost as little as $25. Link to apply: [email or web address]://[email or web address]  ** This summary of benefits is an estimation of the patient's out-of-pocket cost. Exact cost may very based on individual plan coverage.

## 2023-09-03 NOTE — Telephone Encounter (Signed)
 Called and spoke with pt and she will cb to schedule.

## 2023-09-13 ENCOUNTER — Telehealth: Payer: Self-pay

## 2023-09-13 NOTE — Telephone Encounter (Signed)
 Left pt detailed message that her Out-of-pocket cost due at time of clinic visit tomorrow 09/14/23 for Prolia injection will be $331. If pt is unable to pay that amount she would need to reschedule. Sent MyChart message as well.

## 2023-09-14 ENCOUNTER — Ambulatory Visit

## 2023-09-14 DIAGNOSIS — M8000XS Age-related osteoporosis with current pathological fracture, unspecified site, sequela: Secondary | ICD-10-CM

## 2023-09-14 DIAGNOSIS — M810 Age-related osteoporosis without current pathological fracture: Secondary | ICD-10-CM

## 2023-09-14 MED ORDER — DENOSUMAB 60 MG/ML ~~LOC~~ SOSY
60.0000 mg | PREFILLED_SYRINGE | Freq: Once | SUBCUTANEOUS | Status: AC
Start: 2024-03-15 — End: ?

## 2023-09-14 MED ORDER — DENOSUMAB 60 MG/ML ~~LOC~~ SOSY
60.0000 mg | PREFILLED_SYRINGE | SUBCUTANEOUS | Status: AC
Start: 2023-09-14 — End: ?
  Administered 2023-09-14: 60 mg via SUBCUTANEOUS

## 2023-09-14 NOTE — Progress Notes (Signed)
 Patient is in office today for a nurse visit for  Prolia , per PCP's order. Patient Injection was given in the  Left arm. Patient tolerated injection well. Left lower arm

## 2023-09-23 ENCOUNTER — Ambulatory Visit (INDEPENDENT_AMBULATORY_CARE_PROVIDER_SITE_OTHER): Admitting: Family Medicine

## 2023-09-23 ENCOUNTER — Other Ambulatory Visit: Payer: Self-pay

## 2023-09-23 ENCOUNTER — Encounter: Payer: Self-pay | Admitting: Family Medicine

## 2023-09-23 VITALS — BP 120/70 | Temp 97.2°F | Ht 62.0 in | Wt 154.2 lb

## 2023-09-23 DIAGNOSIS — N3000 Acute cystitis without hematuria: Secondary | ICD-10-CM

## 2023-09-23 DIAGNOSIS — R300 Dysuria: Secondary | ICD-10-CM

## 2023-09-23 DIAGNOSIS — J4541 Moderate persistent asthma with (acute) exacerbation: Secondary | ICD-10-CM | POA: Diagnosis not present

## 2023-09-23 DIAGNOSIS — I10 Essential (primary) hypertension: Secondary | ICD-10-CM

## 2023-09-23 LAB — POC URINALSYSI DIPSTICK (AUTOMATED)
Bilirubin, UA: NEGATIVE
Blood, UA: NEGATIVE
Glucose, UA: NEGATIVE
Ketones, UA: NEGATIVE
Leukocytes, UA: NEGATIVE
Nitrite, UA: NEGATIVE
Protein, UA: NEGATIVE
Spec Grav, UA: 1.01 (ref 1.010–1.025)
Urobilinogen, UA: 0.2 U/dL
pH, UA: 6.5 (ref 5.0–8.0)

## 2023-09-23 MED ORDER — PREDNISONE 20 MG PO TABS: ORAL_TABLET | ORAL | 0 refills | Status: DC

## 2023-09-23 MED ORDER — CIPROFLOXACIN HCL 250 MG PO TABS: 250.0000 mg | ORAL_TABLET | Freq: Two times a day (BID) | ORAL | 0 refills | Status: AC

## 2023-09-23 NOTE — Patient Instructions (Addendum)
 Urinalysis reassuring but per symptoms still possible/likely UTI. Will get culture. Empiric treatment with: ciprofloxacin  (went over potential side effects but recent proteus with several resistances to antibiotic(s) and/or she has allergies to other medicatoins) Patient to follow up if new or worsening symptoms or failure to improve.   With increased albuterol  usage -I'm also concerned about asthma/allergy flare- treat with 7 days of prednisone - follow up if fail to improve or seek care over weekend if worsens

## 2023-09-23 NOTE — Progress Notes (Signed)
 Phone [phone] In person visit   Subjective:   Lisa Greene is a 83 y.o. year old very pleasant female patient who presents for/with See problem oriented charting Chief Complaint  Patient presents with   Dysuria    Pt c/o painful urination since Sunday.    Past Medical History-  Patient Active Problem List   Diagnosis Date Noted   PVC (premature ventricular contraction) 04/14/2017    Priority: High   History of breast cancer- Breast cancer of lower-inner quadrant of left female breast 11/01/2015    Priority: High   Low vitamin B12 level 06/24/2016    Priority: Medium    Stage 3b chronic kidney disease (HCC) 10/22/2015    Priority: Medium    Asthma, moderate persistent, well-controlled 04/19/2014    Priority: Medium    Hypertension 11/25/2010    Priority: Medium    Hyperlipidemia 12/20/2006    Priority: Medium    Osteoporosis 12/20/2006    Priority: Medium    HSV-1 (herpes simplex virus 1) infection 04/28/2022    Priority: Low   Vasomotor rhinitis 05/10/2019    Priority: Low   Chronic allergic conjunctivitis 05/10/2019    Priority: Low   Aortic atherosclerosis (HCC) 11/23/2016    Priority: Low   Family history of abdominal aortic aneurysm 03/08/2015    Priority: Low   Eosinophilia 02/08/2009    Priority: Low   Allergic rhinitis 01/31/2008    Priority: Low   Lumbar facet arthropathy 08/11/2022    Priority: 1.   Closed compression fracture of L2 lumbar vertebra, with routine healing, subsequent encounter 08/11/2022    Priority: 1.   Pain in left ankle and joints of left foot 06/18/2021    Priority: 1.    Medications- reviewed and updated Current Outpatient Medications  Medication Sig Dispense Refill   Albuterol  Sulfate (PROAIR  RESPICLICK) 108 (90 Base) MCG/ACT AEPB Inhale 1 puff into the lungs every 8 (eight) hours as needed (wheezing/shortness of breath).     atorvastatin  (LIPITOR) 20 MG tablet Take 1 tablet (20 mg total) by mouth daily. 90 tablet 3    azelastine (OPTIVAR) 0.05 % ophthalmic solution      BREO ELLIPTA 200-25 MCG/INH AEPB Inhale 1 puff into the lungs daily.     cetirizine (ZYRTEC) 10 MG tablet Take 10 mg by mouth daily.     cholecalciferol (VITAMIN D ) 1000 UNITS tablet Take 2,000 Units by mouth daily.     ciprofloxacin  (CIPRO ) 250 MG tablet Take 1 tablet (250 mg total) by mouth 2 (two) times daily for 3 days. 6 tablet 0   Cyanocobalamin  (VITAMIN B 12 PO) Take 1,000 mg by mouth every other day.     denosumab  (PROLIA ) 60 MG/ML SOSY injection INJECT 60 MG INTO THE SKIN EVERY 6 (SIX) MONTHS. 1 mL 1   fluticasone (FLONASE) 50 MCG/ACT nasal spray Place 1 spray into both nostrils daily as needed.      hydrochlorothiazide  (MICROZIDE ) 12.5 MG capsule TAKE 1 CAPSULE BY MOUTH ON MONDAY WEDNESDAY AND FRIDAY AS DIRECTED 36 capsule 0   irbesartan  (AVAPRO ) 150 MG tablet TAKE ONE TABLET BY MOUTH DAILY 90 tablet 3   metoprolol  succinate (TOPROL -XL) 50 MG 24 hr tablet Take 1 tablet (50 mg total) by mouth daily. 90 tablet 3   montelukast (SINGULAIR) 10 MG tablet Take 1 tablet by mouth at bedtime.     predniSONE  (DELTASONE ) 20 MG tablet Take 2 pills for 3 days, 1 pill for 4 days for asthma flare 10 tablet 0  Current Facility-Administered Medications  Medication Dose Route Frequency Provider Last Rate Last Admin   denosumab  (PROLIA ) injection 60 mg  60 mg Subcutaneous Q6 months Christel Cousins, MD   60 mg at 09/14/23 0912   [START ON 03/15/2024] denosumab  (PROLIA ) injection 60 mg  60 mg Subcutaneous Once Kulik, Ann Marie, MD       Cecily Cohen ON 03/15/2024] denosumab  (PROLIA ) injection 60 mg  60 mg Subcutaneous Once Kulik, Ann Marie, MD         Objective:  BP 120/70 Comment: most recent home reading  Temp (!) 97.2 F (36.2 C)   Ht 5\' 2"  (1.575 m)   Wt 154 lb 3.2 oz (69.9 kg)   BMI 28.20 kg/m  Gen: NAD, resting comfortably CV: RRR no murmurs rubs or gallops Lungs: CTAB no crackles, wheeze, rhonchi Abdomen: mild lower abdominal pain Ext: no  edema Skin: warm, dry   Results for orders placed or performed in visit on 09/23/23 (from the past 24 hours)  POCT Urinalysis Dipstick (Automated)     Status: None   Collection Time: 09/23/23  9:05 AM  Result Value Ref Range   Color, UA yellow    Clarity, UA clear    Glucose, UA Negative Negative   Bilirubin, UA neg    Ketones, UA neg    Spec Grav, UA 1.010 1.010 - 1.025   Blood, UA neg    pH, UA 6.5 5.0 - 8.0   Protein, UA Negative Negative   Urobilinogen, UA 0.2 0.2 or 1.0 E.U./dL   Nitrite, UA neg    Leukocytes, UA Negative Negative       Assessment and Plan   #Concern for UTI S: Patients symptoms started on Sunday-4 days ago. Feels more weak with this and trembling that comes and goes. Also feels almost smothered after some yard work- rescue inhaler helps.  -Of note had been treated for UTI 08/24/2023 by Dr. Waldo Guitar and did have Proteus that was resistant to cefazolin but patient had improvement on cefuroxime  and course was completed Complains of dysuria: YES described as discomfort; polyuria: YES; nocturia: 3-4x a night with this; urgency: some. Some lower abdominal pain Symptoms are about the same from Sunday- other than feeling weaker with time.  ROS- no fever, chills, nausea, vomiting, flank pain (at least not new). No blood in urine.  A/P: UA reassuring but per symptoms possible UTI. Will get culture. Empiric treatment with: cpirofloxacin (went over potential side effects but recent proteus with several resistances to antibiotic(s) and/or she has allergies to other) Patient to follow up if new or worsening symptoms or failure to improve.    #hypertension with white coat element/PVCs on metoprolol  S: medication: irbesartan  150mg , metoprolol  50 mg XR, hydrochlorothiazide  12.5 mg Monday Wednesday Friday Home readings #s: usually peak 140- often much lower at home BP Readings from Last 3 Encounters:  09/23/23 120/70  08/27/23 (!) 142/80  08/24/23 (!) 162/86   A/P: Elevation  in office but well-controlled at home-continue current medication  # Asthma/allergies- follows with Dr. Johnson Nanny: Maintenance Medication: Breo 200-25 mcg/inh 1 puff daily each morning and singulair (astelin and flonase and zyrtec as needed as well) As needed medication: albuterol . Needing this more- about 3x yesterday but had been doing more yard work earlier in the week- staying indoors more now- still got out an hour yesterday -some headache(s)/sinus congestion - tylenol  helps - no chest pain  A/P: I am concerned patient may have asthma flare with increased albuterol  usage-not wheezing today  but had recently used albuterol .  Treat with 7 days of prednisone  and seek care over the weekend if symptoms worsen or develops new symptoms like chest pain  Recommended follow up: Return for as needed for new, worsening, persistent symptoms. Future Appointments  Date Time Provider Department Center  09/28/2023  2:00 PM [facility] 1 [id number]  12/07/2023  9:20 AM [facility] ANNUAL WELLNESS VISIT 1 [facility]  01/24/2024 11:00 AM Almira Jaeger, MD [facility]   Lab/Order associations:   ICD-10-CM   1. Dysuria  R30.0 POCT Urinalysis Dipstick (Automated)    Urine Culture    2. Acute cystitis without hematuria  N30.00     3. Primary hypertension  I10     4. Moderate persistent asthma with exacerbation  J45.41       Meds ordered this encounter  Medications   ciprofloxacin  (CIPRO ) 250 MG tablet    Sig: Take 1 tablet (250 mg total) by mouth 2 (two) times daily for 3 days.    Dispense:  6 tablet    Refill:  0   predniSONE  (DELTASONE ) 20 MG tablet    Sig: Take 2 pills for 3 days, 1 pill for 4 days for asthma flare    Dispense:  10 tablet    Refill:  0    Return precautions advised.  Clarisa Crooked, MD

## 2023-09-24 LAB — URINE CULTURE
MICRO NUMBER:: 16370964
Result:: NO GROWTH
SPECIMEN QUALITY:: ADEQUATE

## 2023-09-25 ENCOUNTER — Encounter: Payer: Self-pay | Admitting: Family Medicine

## 2023-09-28 ENCOUNTER — Ambulatory Visit (HOSPITAL_COMMUNITY): Attending: Internal Medicine

## 2023-09-28 DIAGNOSIS — I340 Nonrheumatic mitral (valve) insufficiency: Secondary | ICD-10-CM | POA: Diagnosis not present

## 2023-09-29 ENCOUNTER — Other Ambulatory Visit: Payer: Self-pay | Admitting: Internal Medicine

## 2023-09-30 LAB — ECHOCARDIOGRAM COMPLETE
Area-P 1/2: 3.92 cm2
S' Lateral: 2.6 cm

## 2023-10-19 DIAGNOSIS — H1045 Other chronic allergic conjunctivitis: Secondary | ICD-10-CM | POA: Diagnosis not present

## 2023-10-19 DIAGNOSIS — J300 Vasomotor rhinitis: Secondary | ICD-10-CM | POA: Diagnosis not present

## 2023-10-19 DIAGNOSIS — J4540 Moderate persistent asthma, uncomplicated: Secondary | ICD-10-CM | POA: Diagnosis not present

## 2023-10-22 ENCOUNTER — Other Ambulatory Visit: Payer: Self-pay | Admitting: Internal Medicine

## 2023-10-31 ENCOUNTER — Other Ambulatory Visit: Payer: Self-pay | Admitting: Internal Medicine

## 2023-10-31 DIAGNOSIS — E785 Hyperlipidemia, unspecified: Secondary | ICD-10-CM

## 2023-11-01 ENCOUNTER — Other Ambulatory Visit: Payer: Self-pay

## 2023-11-01 DIAGNOSIS — E785 Hyperlipidemia, unspecified: Secondary | ICD-10-CM

## 2023-11-01 MED ORDER — ATORVASTATIN CALCIUM 20 MG PO TABS
20.0000 mg | ORAL_TABLET | Freq: Every day | ORAL | 3 refills | Status: AC
Start: 2023-11-01 — End: ?

## 2023-12-07 ENCOUNTER — Ambulatory Visit (INDEPENDENT_AMBULATORY_CARE_PROVIDER_SITE_OTHER): Payer: Medicare HMO

## 2023-12-07 VITALS — Ht 61.0 in | Wt 151.0 lb

## 2023-12-07 DIAGNOSIS — Z0000 Encounter for general adult medical examination without abnormal findings: Secondary | ICD-10-CM

## 2023-12-07 NOTE — Progress Notes (Signed)
 Subjective:   Lisa Greene is a 83 y.o. who presents for a Medicare Wellness preventive visit.  As a reminder, Annual Wellness Visits don't include a physical exam, and some assessments may be limited, especially if this visit is performed virtually. We may recommend an in-person follow-up visit with your provider if needed.  Visit Complete: Virtual I connected with  Lisa Greene on 12/07/23 by a audio enabled telemedicine application and verified that I am speaking with the correct person using two identifiers.  Patient Location: Home  Provider Location: Office/Clinic  I discussed the limitations of evaluation and management by telemedicine. The patient expressed understanding and agreed to proceed.  Vital Signs: Because this visit was a virtual/telehealth visit, some criteria may be missing or patient reported. Any vitals not documented were not able to be obtained and vitals that have been documented are patient reported.  VideoDeclined- This patient declined Librarian, academic. Therefore the visit was completed with audio only.  Persons Participating in Visit: Patient.  AWV Questionnaire: No: Patient Medicare AWV questionnaire was not completed prior to this visit.  Cardiac Risk Factors include: advanced age (>20men, >28 women);hypertension;dyslipidemia     Objective:    Today's Vitals   12/07/23 0944  Weight: 151 lb (68.5 kg)  Height: 5' 1 (1.549 m)  PainSc: 3    Body mass index is 28.53 kg/m.     12/07/2023    9:53 AM 02/09/2023   11:43 AM 11/26/2022    9:08 AM 05/18/2022   12:20 PM 11/18/2021   10:32 AM 09/02/2020   12:10 PM 03/16/2019    2:54 PM  Advanced Directives  Does Patient Have a Medical Advance Directive? Yes Yes Yes No Yes Yes Yes  Type of Estate agent of [place];Living will Healthcare Power of [place];Living will Healthcare Power of [place];Living will  Healthcare Power of Attorney Living  will;Healthcare Power of Attorney Living will;Healthcare Power of Attorney  Does patient want to make changes to medical advance directive?       No - Patient declined  Copy of Healthcare Power of Attorney in Chart? No - copy requested No - copy requested No - copy requested  No - copy requested No - copy requested No - copy requested  Would patient like information on creating a medical advance directive? No - Patient declined No - Patient declined  No - Patient declined       Current Medications (verified) Outpatient Encounter Medications as of 12/07/2023  Medication Sig   Albuterol  Sulfate (PROAIR  RESPICLICK) 108 (90 Base) MCG/ACT AEPB Inhale 1 puff into the lungs every 8 (eight) hours as needed (wheezing/shortness of breath).   atorvastatin  (LIPITOR) 20 MG tablet Take 1 tablet (20 mg total) by mouth daily.   azelastine (OPTIVAR) 0.05 % ophthalmic solution    BREZTRI AEROSPHERE 160-9-4.8 MCG/ACT AERO inhaler Inhale into the lungs.   cetirizine (ZYRTEC) 10 MG tablet Take 10 mg by mouth daily.   cholecalciferol (VITAMIN D ) 1000 UNITS tablet Take 2,000 Units by mouth daily.   Cyanocobalamin  (VITAMIN B 12 PO) Take 1,000 mg by mouth every other day.   denosumab  (PROLIA ) 60 MG/ML SOSY injection INJECT 60 MG INTO THE SKIN EVERY 6 (SIX) MONTHS.   fluticasone (FLONASE) 50 MCG/ACT nasal spray Place 1 spray into both nostrils daily as needed.    hydrochlorothiazide  (MICROZIDE ) 12.5 MG capsule Take 1 capsule (12.5 mg total) by mouth every Monday, Wednesday, and Friday. AS DIRECTED   irbesartan  (AVAPRO ) 150  MG tablet TAKE ONE TABLET BY MOUTH DAILY   metoprolol  succinate (TOPROL -XL) 50 MG 24 hr tablet TAKE 1 TABLET BY MOUTH DAILY   montelukast (SINGULAIR) 10 MG tablet Take 1 tablet by mouth at bedtime.   [DISCONTINUED] BREO ELLIPTA 200-25 MCG/INH AEPB Inhale 1 puff into the lungs daily.   [DISCONTINUED] predniSONE  (DELTASONE ) 20 MG tablet Take 2 pills for 3 days, 1 pill for 4 days for asthma flare    Facility-Administered Encounter Medications as of 12/07/2023  Medication   denosumab  (PROLIA ) injection 60 mg   [START ON 03/15/2024] denosumab  (PROLIA ) injection 60 mg   [START ON 03/15/2024] denosumab  (PROLIA ) injection 60 mg    Allergies (verified) Doxycycline , Lisinopril , Latex, Penicillins, Sulfonamide derivatives, and Tape   History: Past Medical History:  Diagnosis Date   ALLERGIC RHINITIS 01/31/2008   Allergy 1960   Asthma 2019   BCC (basal cell carcinoma)sup& nod 03/01/2017   right nostril   Breast cancer of lower-inner quadrant of left female breast (HCC) 11/01/2015   treated with lumpectomy and radiation   Cataract    Colon cancer screening 05/21/2014   No polyps at age 1. Diverticulosis alone-no more colonoscopies.     DIVERTICULITIS, HX OF 09/07/2008   pt denies this    Eosinophilia 02/08/2009   Fibroid    Heart murmur    HYPERLIPIDEMIA 12/20/2006   Hypertension    OSTEOPOROSIS 12/20/2006   PVC's (premature ventricular contractions)    SCC (squamous cell carcinoma) well diff 11/28/2013   left side chin   Vitamin B 12 deficiency    Past Surgical History:  Procedure Laterality Date   BREAST LUMPECTOMY WITH NEEDLE LOCALIZATION Left 01/03/2016   Procedure: BREAST re-excision LUMPECTOMY WITH NEEDLE LOCALIZATION;  Surgeon: Donnice Bury, MD;  Location: [facility];  Service: General;  Laterality: Left;  BREAST re-excision LUMPECTOMY WITH NEEDLE LOCALIZATION   BREAST LUMPECTOMY WITH RADIOACTIVE SEED LOCALIZATION Left 11/28/2015   Procedure: LEFT BREAST LUMPECTOMY WITH BRACKETED RADIOACTIVE SEED LOCALIZATION;  Surgeon: Donnice Bury, MD;  Location: [facility];  Service: General;  Laterality: Left;   BREAST SURGERY  2017   cysts removed x2   CATARACT EXTRACTION BILATERAL W/ ANTERIOR VITRECTOMY  2013   bilateral cataracts   COLONOSCOPY  2005   tics only    FOOT SURGERY     IR KYPHO LUMBAR INC FX REDUCE BONE BX UNI/BIL  CANNULATION INC/IMAGING  05/28/2022   IR RADIOLOGIST EVAL & MGMT  05/21/2022   KNEE ARTHROSCOPY     left   NEUROMA SURGERY     x2 feet   thyroid  duct cyst     Family History  Problem Relation Age of Onset   Brain cancer Mother    COPD Father    Heart disease Father        stent placed 40   Allergic rhinitis Father    COPD Brother        died of flu/double pna age 86   Healthy Brother        lives local- sees Dr. Katrinka   Arthritis Brother        back issues   Colon cancer Neg Hx    Stomach cancer Neg Hx    Social History   Socioeconomic History   Marital status: Married    Spouse name: Not on file   Number of children: Not on file   Years of education: Not on file   Highest education level: Not on file  Occupational History  Occupation: retired  Tobacco Use   Smoking status: Never   Smokeless tobacco: Never  Vaping Use   Vaping status: Never Used  Substance and Sexual Activity   Alcohol use: No    Alcohol/week: 0.0 standard drinks of alcohol   Drug use: No   Sexual activity: Not Currently    Partners: Male    Birth control/protection: Abstinence, Post-menopausal, None  Other Topics Concern   Not on file  Social History Narrative   Married. No kids- 2 step kids. 5 step grandkids.  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[employer])      Hobbies: beach, read, crochet, knit, watch tv   Social Drivers of Health   Financial Resource Strain: Low Risk  (12/07/2023)   Overall Financial Resource Strain (CARDIA)    Difficulty of Paying Living Expenses: Not hard at all  Food Insecurity: No Food Insecurity (12/07/2023)   Hunger Vital Sign    Worried About Running Out of Food in the Last Year: Never true    Ran Out of Food in the Last Year: Never true  Transportation Needs: No Transportation Needs (12/07/2023)   PRAPARE - Administrator, Civil Service (Medical): No    Lack of Transportation (Non-Medical): No  Physical  Activity: Sufficiently Active (12/07/2023)   Exercise Vital Sign    Days of Exercise per Week: 3 days    Minutes of Exercise per Session: 120 min  Stress: No Stress Concern Present (12/07/2023)   [employer] of Occupational Health - Occupational Stress Questionnaire    Feeling of Stress: Not at all  Social Connections: Moderately Integrated (12/07/2023)   Social Connection and Isolation Panel    Frequency of Communication with Friends and Family: More than three times a week    Frequency of Social Gatherings with Friends and Family: More than three times a week    Attends Religious Services: More than 4 times per year    Active Member of [employer] or Organizations: No    Attends Engineer, structural: Never    Marital Status: Married    Tobacco Counseling Counseling given: Not Answered    Clinical Intake:  Pre-visit preparation completed: Yes  Pain : 0-10 Pain Score: 3  Pain Type: Acute pain Pain Location: Neck Pain Descriptors / Indicators: Sore Pain Onset: 1 to 4 weeks ago     BMI - recorded: 28.53 Nutritional Status: BMI 25 -29 Overweight Nutritional Risks: None Diabetes: No  Lab Results  Component Value Date   HGBA1C 6.2 07/26/2023     How often do you need to have someone help you when you read instructions, pamphlets, or other written materials from your doctor or pharmacy?: 1 - Never  Interpreter Needed?: No  Information entered by :: Ellouise Haws, LPN   Activities of Daily Living     12/07/2023    9:47 AM  In your present state of health, do you have any difficulty performing the following activities:  Hearing? 0  Vision? 0  Difficulty concentrating or making decisions? 0  Walking or climbing stairs? 0  Dressing or bathing? 0  Doing errands, shopping? 0  Preparing Food and eating ? N  Using the Toilet? N  In the past six months, have you accidently leaked urine? N  Do you have problems with loss of bowel control? N  Managing your  Medications? N  Managing your Finances? N  Housekeeping or managing your Housekeeping? N  Patient Care Team: Katrinka Garnette KIDD, MD as PCP - General (Family Medicine) Santo Stanly LABOR, MD as PCP - Cardiology (Cardiology) Ebbie Cough, MD as Consulting Physician (General Surgery) Odean Potts, MD as Consulting Physician (Hematology and Oncology) Dewey Rush, MD as Consulting Physician (Radiation Oncology) Charmayne Molly, MD as Consulting Physician (Ophthalmology) Frutoso Luz, MD as Referring Physician (Allergy) Porter Andrez SAUNDERS, PA-C as Physician Assistant (Dermatology)  I have updated your Care Teams any recent Medical Services you may have received from other providers in the past year.     Assessment:   This is a routine wellness examination for [place].  Hearing/Vision screen Hearing Screening - Comments:: Pt denies any hearing issues  Vision Screening - Comments:: Wears rx glasses - up to date with routine eye exams with Dr Elspeth Charmayne     Goals Addressed             This Visit's Progress    Patient Stated       Lose a little weight        Depression Screen     12/07/2023    9:49 AM 03/12/2023    1:42 PM 11/26/2022    9:06 AM 11/06/2022   11:47 AM 08/14/2022   11:03 AM 07/14/2022    9:45 AM 11/18/2021   10:32 AM  PHQ 2/9 Scores  PHQ - 2 Score 0 0 0 0 0 0 0  PHQ- 9 Score 0 0 0 0 0 0     Fall Risk     12/07/2023    9:54 AM 03/12/2023    1:38 PM 11/26/2022    9:09 AM 11/06/2022   11:47 AM 08/14/2022   11:03 AM  Fall Risk   Falls in the past year? 0 0 0 0 0  Number falls in past yr: 0 0 0 0 0  Injury with Fall? 0 0 0 0 0  Risk for fall due to : No Fall Risks  No Fall Risks No Fall Risks No Fall Risks  Follow up Falls prevention discussed Falls evaluation completed Falls prevention discussed Falls evaluation completed     MEDICARE RISK AT HOME:  Medicare Risk at Home Any stairs in or around the home?: Yes If so, are there any without  handrails?: No Home free of loose throw rugs in walkways, pet beds, electrical cords, etc?: Yes Adequate lighting in your home to reduce risk of falls?: Yes Life alert?: No Use of a cane, walker or w/c?: No Grab bars in the bathroom?: No Shower chair or bench in shower?: No Elevated toilet seat or a handicapped toilet?: No  TIMED UP AND GO:  Was the test performed?  No  Cognitive Function: 6CIT completed    11/06/2016   11:53 AM  MMSE - Mini Mental State Exam  Not completed: --        12/07/2023    9:54 AM 11/26/2022    9:10 AM 11/18/2021   10:35 AM 09/02/2020   12:13 PM 03/16/2019    3:12 PM  6CIT Screen  What Year? 0 points 0 points 0 points 0 points 0 points  What month? 0 points 0 points 0 points 0 points 0 points  What time? 0 points 0 points 0 points  0 points  Count back from 20 0 points 0 points 0 points 0 points 0 points  Months in reverse 0 points 0 points 0 points 0 points 0 points  Repeat phrase 0 points 0 points 2 points 0 points 0  points  Total Score 0 points 0 points 2 points  0 points    Immunizations Immunization History  Administered Date(s) Administered   Fluad Quad(high Dose 65+) 04/07/2019, 05/07/2020, 04/01/2022   Fluad Trivalent(High Dose 65+) 04/01/2023   Hep A, Unspecified 10/22/1998   Hepatitis A 10/22/1998   Influenza Whole 03/02/2011   Influenza, High Dose Seasonal PF 05/04/2016, 05/05/2019, 04/15/2021   Influenza-Unspecified 05/01/2021   PFIZER(Purple Top)SARS-COV-2 Vaccination 06/22/2019, 07/13/2019, 03/07/2020, 05/03/2020   Pneumococcal Conjugate-13 01/19/2007, 04/19/2014, 06/06/2015   Pneumococcal Polysaccharide-23 01/19/2007, 10/08/2014, 11/04/2015, 05/03/2017, 05/10/2018, 05/05/2019, 05/03/2020   Rsv, Bivalent, Protein Subunit Rsvpref,pf [place]) 07/25/2022   Td 07/02/2002, 09/23/2017, 12/22/2022   Tdap 08/22/2012   Zoster Recombinant(Shingrix ) 12/28/2018, 06/08/2019   Zoster, Live 02/10/2010    Screening Tests Health Maintenance   Topic Date Due   COVID-19 Vaccine (5 - 2024-25 season) 01/31/2023   MAMMOGRAM  12/21/2023   INFLUENZA VACCINE  12/31/2023   Medicare Annual Wellness (AWV)  12/06/2024   DTaP/Tdap/Td (5 - Td or Tdap) 12/21/2032   Pneumococcal Vaccine: 50+ Years  Completed   DEXA SCAN  Completed   Zoster Vaccines- Shingrix   Completed   Hepatitis B Vaccines  Aged Out   HPV VACCINES  Aged Out   Meningococcal B Vaccine  Aged Out   Hepatitis C Screening  Discontinued    Health Maintenance  Health Maintenance Due  Topic Date Due   COVID-19 Vaccine (5 - 2024-25 season) 01/31/2023   Health Maintenance Items Addressed: See Nurse Notes at the end of this note  Additional Screening:  Vision Screening: Recommended annual ophthalmology exams for early detection of glaucoma and other disorders of the eye. Would you like a referral to an eye doctor? No    Dental Screening: Recommended annual dental exams for proper oral hygiene  Community Resource Referral / Chronic Care Management: CRR required this visit?  No   CCM required this visit?  No   Plan:    I have personally reviewed and noted the following in the patient's chart:   Medical and social history Use of alcohol, tobacco or illicit drugs  Current medications and supplements including opioid prescriptions. Patient is not currently taking opioid prescriptions. Functional ability and status Nutritional status Physical activity Advanced directives List of other physicians Hospitalizations, surgeries, and ER visits in previous 12 months Vitals Screenings to include cognitive, depression, and falls Referrals and appointments  In addition, I have reviewed and discussed with patient certain preventive protocols, quality metrics, and best practice recommendations. A written personalized care plan for preventive services as well as general preventive health recommendations were provided to patient.   Ellouise VEAR Haws, LPN   07/02/7972   After  Visit Summary: (MyChart) Due to this being a telephonic visit, the after visit summary with patients personalized plan was offered to patient via MyChart   Notes: PCP Follow Up Recommendations: Pt complained of neck and head pain stated earliest appointment was 01/24/24 today pt complained of a knot on her left foot with swelling .appt moved up to 12/13/23 at 4pm pt is also on waitlist

## 2023-12-07 NOTE — Patient Instructions (Signed)
 [name] , Thank you for taking time out of your busy schedule to complete your Annual Wellness Visit with me. I enjoyed our conversation and look forward to speaking with you again next year. I, as well as your care team,  appreciate your ongoing commitment to your health goals. Please review the following plan we discussed and let me know if I can assist you in the future. Your Game plan/ To Do List    Referrals: If you haven't heard from the office you've been referred to, please reach out to them at the phone provided.   Follow up Visits: Next Medicare AWV with our clinical staff: 12/11/24   Have you seen your provider in the last 6 months (3 months if uncontrolled diabetes)? Yes Next Office Visit with your provider: 01/24/24  Clinician Recommendations:  Aim for 30 minutes of exercise or brisk walking, 6-8 glasses of water, and 5 servings of fruits and vegetables each day.    This is a list of the screening recommended for you and due dates:  Health Maintenance  Topic Date Due   COVID-19 Vaccine (5 - 2024-25 season) 01/31/2023   Medicare Annual Wellness Visit  11/26/2023   Mammogram  12/21/2023   Flu Shot  12/31/2023   DTaP/Tdap/Td vaccine (5 - Td or Tdap) 12/21/2032   Pneumococcal Vaccine for age over 11  Completed   DEXA scan (bone density measurement)  Completed   Zoster (Shingles) Vaccine  Completed   Hepatitis B Vaccine  Aged Out   HPV Vaccine  Aged Out   Meningitis B Vaccine  Aged Out   Hepatitis C Screening  Discontinued    Advanced directives: (Copy Requested) Please bring a copy of your health care power of attorney and living will to the office to be added to your chart at your convenience. You can mail to [facility] [address] [place], [place] [address] or email to [email or web address] Advance Care Planning is important because it:  [x]  Makes sure you receive the medical care that is consistent with your values, goals, and preferences  [x]   It provides guidance to your family and loved ones and reduces their decisional burden about whether or not they are making the right decisions based on your wishes.  Follow the link provided in your after visit summary or read over the paperwork we have mailed to you to help you started getting your Advance Directives in place. If you need assistance in completing these, please reach out to us  so that we can help you!  See attachments for Preventive Care and Fall Prevention Tips.

## 2023-12-13 ENCOUNTER — Encounter: Payer: Self-pay | Admitting: Family Medicine

## 2023-12-13 ENCOUNTER — Ambulatory Visit (INDEPENDENT_AMBULATORY_CARE_PROVIDER_SITE_OTHER): Admitting: Family Medicine

## 2023-12-13 VITALS — BP 112/70 | HR 51 | Temp 98.0°F | Ht 61.0 in | Wt 153.0 lb

## 2023-12-13 DIAGNOSIS — E785 Hyperlipidemia, unspecified: Secondary | ICD-10-CM | POA: Diagnosis not present

## 2023-12-13 DIAGNOSIS — I10 Essential (primary) hypertension: Secondary | ICD-10-CM | POA: Diagnosis not present

## 2023-12-13 DIAGNOSIS — M542 Cervicalgia: Secondary | ICD-10-CM

## 2023-12-13 DIAGNOSIS — R600 Localized edema: Secondary | ICD-10-CM

## 2023-12-13 MED ORDER — FLUTICASONE PROPIONATE 50 MCG/ACT NA SUSP: 2.0000 | Freq: Every day | NASAL | 3 refills | Status: AC

## 2023-12-13 NOTE — Progress Notes (Signed)
 Phone [phone] In person visit   Subjective:   Lisa Greene is a 83 y.o. year old very pleasant female patient who presents for/with See problem oriented charting Chief Complaint  Patient presents with   Neck Pain    Pt c/o neck pain in the back that rises up to the back of her beck   Headache   left ankle pain    Pt c/o left ankle pain and swelling x1 month   Past Medical History-  Patient Active Problem List   Diagnosis Date Noted   PVC (premature ventricular contraction) 04/14/2017    Priority: High   History of breast cancer- Breast cancer of lower-inner quadrant of left female breast 11/01/2015    Priority: High   Low vitamin B12 level 06/24/2016    Priority: Medium    Stage 3b chronic kidney disease (HCC) 10/22/2015    Priority: Medium    Asthma, moderate persistent, well-controlled 04/19/2014    Priority: Medium    Hypertension 11/25/2010    Priority: Medium    Hyperlipidemia 12/20/2006    Priority: Medium    Osteoporosis 12/20/2006    Priority: Medium    HSV-1 (herpes simplex virus 1) infection 04/28/2022    Priority: Low   Vasomotor rhinitis 05/10/2019    Priority: Low   Chronic allergic conjunctivitis 05/10/2019    Priority: Low   Aortic atherosclerosis (HCC) 11/23/2016    Priority: Low   Family history of abdominal aortic aneurysm 03/08/2015    Priority: Low   Eosinophilia 02/08/2009    Priority: Low   Allergic rhinitis 01/31/2008    Priority: Low   Lumbar facet arthropathy 08/11/2022    Priority: 1.   Closed compression fracture of L2 lumbar vertebra, with routine healing, subsequent encounter 08/11/2022    Priority: 1.   Pain in left ankle and joints of left foot 06/18/2021    Priority: 1.    Medications- reviewed and updated Current Outpatient Medications  Medication Sig Dispense Refill   atorvastatin  (LIPITOR) 20 MG tablet Take 1 tablet (20 mg total) by mouth daily. 90 tablet 3   azelastine (OPTIVAR) 0.05 % ophthalmic solution       BREZTRI AEROSPHERE 160-9-4.8 MCG/ACT AERO inhaler Inhale into the lungs.     cetirizine (ZYRTEC) 10 MG tablet Take 10 mg by mouth daily.     cholecalciferol (VITAMIN D ) 1000 UNITS tablet Take 2,000 Units by mouth daily.     Cyanocobalamin  (VITAMIN B 12 PO) Take 1,000 mg by mouth every other day.     denosumab  (PROLIA ) 60 MG/ML SOSY injection INJECT 60 MG INTO THE SKIN EVERY 6 (SIX) MONTHS. 1 mL 1   fluticasone  (FLONASE ) 50 MCG/ACT nasal spray Place 2 sprays into both nostrils daily. 48 g 3   hydrochlorothiazide  (MICROZIDE ) 12.5 MG capsule Take 1 capsule (12.5 mg total) by mouth every Monday, Wednesday, and Friday. AS DIRECTED 36 capsule 3   irbesartan  (AVAPRO ) 150 MG tablet TAKE ONE TABLET BY MOUTH DAILY 90 tablet 3   metoprolol  succinate (TOPROL -XL) 50 MG 24 hr tablet TAKE 1 TABLET BY MOUTH DAILY 90 tablet 3   montelukast (SINGULAIR) 10 MG tablet Take 1 tablet by mouth at bedtime.     Albuterol  Sulfate (PROAIR  RESPICLICK) 108 (90 Base) MCG/ACT AEPB Inhale 1 puff into the lungs every 8 (eight) hours as needed (wheezing/shortness of breath).     Current Facility-Administered Medications  Medication Dose Route Frequency Provider Last Rate Last Admin   denosumab  (PROLIA ) injection 60 mg  60  mg Subcutaneous Q6 months Wendolyn Jenkins Jansky, MD   60 mg at 09/14/23 0912   [START ON 03/15/2024] denosumab  (PROLIA ) injection 60 mg  60 mg Subcutaneous Once Kulik, Ann Marie, MD       NOREEN ON 03/15/2024] denosumab  (PROLIA ) injection 60 mg  60 mg Subcutaneous Once Kulik, Ann Marie, MD         Objective:  BP 112/70   Pulse (!) 51   Temp 98 F (36.7 C)   Ht 5' 1 (1.549 m)   Wt 153 lb (69.4 kg)   SpO2 97%   BMI 28.91 kg/m  Gen: NAD, resting comfortably CV: RRR no murmurs rubs or gallops Lungs: CTAB no crackles, wheeze, rhonchi Ext: Minimal edema on the right, 1-2+ edema on the left with no calf pain or increased calf size 10 cm below tibial plateau Skin: warm, dry  Neuro: CN II-XII intact, sensation  and reflexes normal throughout, 5/5 muscle strength in bilateral upper and lower extremities. Normal finger to nose. Normal rapid alternating movements. No pronator drift. Normal romberg. Normal gait.      Assessment and Plan   # Neck pain S: Patient notes pain in her neck from mid to upper portions. First felt like couldn't turn head all the way like significant crick in the neck. She still has limited range of motion despite it being nearly a month of this. She kept thinking it would go away. At times she will get headaches related to the neck tension- starts in back of scalp and can reach top of scalp -she thought last night possibly pillow not high enough- used to buy every 3 months but stretching this out more recently really from allergic perspective. Got a new one 3 months ago but was feeling smothered/possibly allergic and went back to old one.   -She has had compression fractures in the past and lumbar spine and has history of osteoporosis -no midline pain, no arm pain. No weakness in upper extremities -no fall or injury -had been weed eating for multiple hours day before this started -some fatigue in mornings. Better by afternoon in general- not linked to acute issues A/P: new onset bilateral neck pain without midline pain with loss of range of motion without fever/illness to suggest meningitis- appears to be more of a strain  -wants to try new pillow -wants to try heat -if fails to improve we could refer to physical therapy  - if possible get someone to weed eat for you for a few days -Offered x-rays but she wants to hold off  She has had some associated headaches intermittently-neurological exam reassuring today  # Left ankle pain/swelling S: Over the last month patient has noted left ankle pain as well as some swelling -No history of DVT . Started off with freezer carton hitting her on anterior and medial portion of ankle. Better with elevation and resolves overnight. Had a  swollen up area on medial side that thankfully has gone back donw. Leg shiny compared to the right/normal side A/P: Patient with rather significant left ankle swelling with some associated pain after prior trauma-I think this is likely trauma related but her primary concern is to rule out clot and we will order venous duplex to be on the safe side-hopefully through [facility]     #hyperlipidemia #aortic atherosclerosis- LDL goal under 70 ideally S: Medication:atorvastatin  20 mg  Lab Results  Component Value Date   CHOL 163 07/26/2023   HDL 76.50 07/26/2023   LDLCALC 68  07/26/2023   LDLDIRECT 59.0 10/17/2019   TRIG 96.0 07/26/2023   CHOLHDL 2 07/26/2023   A/P: HLD-very well-controlled-continue current medication Aortic atherosclerosis-lipids at goal even for aortic atherosclerosis-continue current medication  #hypertension with white coat element/PVCs on metoprolol  S: medication: irbesartan  150mg , metoprolol  50 mg XR, hydrochlorothiazide  12.5 mg Monday Wednesday Friday.  Had been on amlodipine  in the past but have edema Home readings #s:  home cuff previously verified.  A/P: Blood pressure very well-controlled-continue current medicine  Recommended follow up: Return for as needed for new, worsening, persistent symptoms. Future Appointments  Date Time Provider Department Center  12/14/2023 10:45 AM [facility] US  1 [facility]  01/24/2024 11:00 AM Katrinka Garnette KIDD, MD [facility]  12/11/2024  9:20 AM [facility] ANNUAL WELLNESS VISIT 1 [facility]    Lab/Order associations:   ICD-10-CM   1. Leg edema, left  R60.0 US  Venous Img Lower Unilateral Left (DVT)    CANCELED: US  Venous Img Lower Unilateral Left (DVT)    2. Neck pain  M54.2     3. Primary hypertension  I10     4. Hyperlipidemia, unspecified hyperlipidemia type  E78.5       Meds ordered this encounter  Medications   fluticasone  (FLONASE ) 50 MCG/ACT nasal spray    Sig: Place 2 sprays into both nostrils  daily.    Dispense:  48 g    Refill:  3    Return precautions advised.  Garnette Katrinka, MD

## 2023-12-13 NOTE — Patient Instructions (Addendum)
 new onset with loss of range of motion without fever/illness to suggest meningitis- appears to be more of a strain  -wants to try new pillow -wants to try heat -if fails to improve we could refer to physical therapy  - if possible get someone to weed eat for you for a few days  We will call you within two weeks about your referral for ultrasound of leg to rule out clot through [facility].  Their phone number is [phone].  Please call them if you have not heard in 1-2 weeks  -if any shortness of breath or chest pain  seek care immediately   Recommended follow up: Return for as needed for new, worsening, persistent symptoms.

## 2023-12-14 ENCOUNTER — Inpatient Hospital Stay
Admission: RE | Admit: 2023-12-14 | Discharge: 2023-12-14 | Discharge status: Home / Self Care | Source: Ambulatory Visit | Attending: Family Medicine | Admitting: Family Medicine

## 2023-12-14 ENCOUNTER — Ambulatory Visit: Payer: Self-pay | Admitting: Family Medicine

## 2023-12-14 DIAGNOSIS — R600 Localized edema: Secondary | ICD-10-CM | POA: Diagnosis not present

## 2023-12-16 ENCOUNTER — Ambulatory Visit: Payer: Self-pay | Admitting: Family Medicine

## 2023-12-20 ENCOUNTER — Ambulatory Visit: Admitting: Podiatry

## 2023-12-21 NOTE — Progress Notes (Unsigned)
   LILLETTE Ileana Collet, PhD, LAT, ATC acting as a scribe for Artist Lloyd, MD.  Lisa Greene is a 83 y.o. female who presents to [facility] today for L ankle pain. Pt was previously seen by Dr. Claudene for lumbar facet arthropathy.  Today, pt c/o L ankle pain ongoing since mid-June. Pain started after a freezer carton hitting her on anterior and medial portion of ankle. Then [place] the [place], she hit her shin trying to get up a big step, the is a small healing wound, but no pain on the shin. Pain worsened on Monday. Pt locates pain to the lateral aspect of the L ankle and into the plantar aspect of the arch. +swelling.   Dx testing: 12/14/23 L LE vasc US   Pertinent review of systems: No fevers or chills  Relevant historical information: L2 compression fracture.  Treated with kyphoplasty 2023.  CKD stage III.  No known history of gout.   Exam:  BP 128/86   Pulse 79   Ht 5' 1 (1.549 m)   Wt 155 lb (70.3 kg)   SpO2 98%   BMI 29.29 kg/m  General: Well Developed, well nourished, and in no acute distress.   MSK: Left ankle some swelling without severe erythema or induration.  Tender palpation anterior and lateral ankle. Pulses capillary fill and sensation are intact distally.    Lab and Radiology Results  X-ray images left ankle obtained today personally and independently interpreted. No acute fractures are visible.  Mild degenerative changes are present. Await formal radiology review.     Assessment and Plan: 83 y.o. female with left ankle pain and swelling occurring following dropping an object onto her foot and ankle.  She did have a fair amount of swelling initially and thankfully had a negative DVT vascular ultrasound test.  Plan for CAM Walker boot Voltaren gel and Tylenol .  Okay to continue Arnica cream.  If she can tolerate it I do think compression stockings would be helpful but I am worried that she will be able to get the compression  stocking on her ankle at this time.  Will go ahead and check uric acid and metabolic panel.  She does have CKD 3 and could have gout.   PDMP not reviewed this encounter. Orders Placed This Encounter  Procedures   DG Ankle Complete Left    Standing Status:   Future    Number of Occurrences:   1    Expiration Date:   12/21/2024    Reason for Exam (SYMPTOM  OR DIAGNOSIS REQUIRED):   left ankle pain    Preferred imaging location?:   [facility]   Basic metabolic panel with GFR    Standing Status:   Future    Number of Occurrences:   1    Expiration Date:   12/21/2024   Uric acid    Standing Status:   Future    Number of Occurrences:   1    Expiration Date:   12/21/2024   No orders of the defined types were placed in this encounter.    Discussed warning signs or symptoms. Please see discharge instructions. Patient expresses understanding.   The above documentation has been reviewed and is accurate and complete Artist Lloyd, M.D.

## 2023-12-22 ENCOUNTER — Ambulatory Visit (INDEPENDENT_AMBULATORY_CARE_PROVIDER_SITE_OTHER)

## 2023-12-22 ENCOUNTER — Ambulatory Visit: Admitting: Family Medicine

## 2023-12-22 VITALS — BP 128/86 | HR 79 | Ht 61.0 in | Wt 155.0 lb

## 2023-12-22 DIAGNOSIS — G8929 Other chronic pain: Secondary | ICD-10-CM

## 2023-12-22 DIAGNOSIS — M19072 Primary osteoarthritis, left ankle and foot: Secondary | ICD-10-CM | POA: Diagnosis not present

## 2023-12-22 DIAGNOSIS — N1832 Chronic kidney disease, stage 3b: Secondary | ICD-10-CM

## 2023-12-22 DIAGNOSIS — M7732 Calcaneal spur, left foot: Secondary | ICD-10-CM | POA: Diagnosis not present

## 2023-12-22 DIAGNOSIS — M25572 Pain in left ankle and joints of left foot: Secondary | ICD-10-CM

## 2023-12-22 DIAGNOSIS — M7989 Other specified soft tissue disorders: Secondary | ICD-10-CM | POA: Diagnosis not present

## 2023-12-22 LAB — URIC ACID: Uric Acid, Serum: 7.5 mg/dL — ABNORMAL HIGH (ref 2.4–7.0)

## 2023-12-22 LAB — BASIC METABOLIC PANEL WITH GFR
BUN: 11 mg/dL (ref 6–23)
CO2: 25 meq/L (ref 19–32)
Calcium: 8.8 mg/dL (ref 8.4–10.5)
Chloride: 104 meq/L (ref 96–112)
Creatinine, Ser: 1.22 mg/dL — ABNORMAL HIGH (ref 0.40–1.20)
GFR: 41.06 mL/min — ABNORMAL LOW (ref >60.00)
Glucose, Bld: 100 mg/dL — ABNORMAL HIGH (ref 70–99)
Potassium: 3.6 meq/L (ref 3.5–5.1)
Sodium: 138 meq/L (ref 135–145)

## 2023-12-22 NOTE — Patient Instructions (Addendum)
 Thank you for coming in today.   Compression stocking  Please use Voltaren gel (Generic Diclofenac Gel) up to 4x daily for pain as needed.  This is available over-the-counter as both the name brand Voltaren gel and the generic diclofenac gel.   Please get labs today before you leave   Try wearing your CAM Walker Boot  Tylenol   Check back as needed

## 2023-12-23 ENCOUNTER — Ambulatory Visit: Admitting: Sports Medicine

## 2023-12-23 ENCOUNTER — Ambulatory Visit: Payer: Self-pay | Admitting: Family Medicine

## 2023-12-23 MED ORDER — ALLOPURINOL 100 MG PO TABS: 50.0000 mg | ORAL_TABLET | Freq: Every day | ORAL | 3 refills | Status: AC

## 2023-12-23 NOTE — Progress Notes (Signed)
 Uric acid is elevated which could cause gout.  I am going to go ahead and start allopurinol .  This will be 1/2 pill daily to try to reduce uric acid and reduce chance of gout flare in the future.  Please let me know how this goes.  Uric acid should be rechecked in about 2 months either in my clinic or with Dr. Katrinka.

## 2023-12-28 NOTE — Progress Notes (Signed)
Left ankle x-ray shows some mild arthritis

## 2023-12-30 ENCOUNTER — Ambulatory Visit: Payer: Self-pay

## 2023-12-30 DIAGNOSIS — Z1231 Encounter for screening mammogram for malignant neoplasm of breast: Secondary | ICD-10-CM | POA: Diagnosis not present

## 2023-12-30 LAB — HM MAMMOGRAPHY

## 2023-12-30 NOTE — Telephone Encounter (Signed)
 FYI Only or Action Required?: Action required by provider: request for appointment. Patient only wants to see Dr. Katrinka. Refused appointment with another provider and refused to go to ED or Urgent care.   Patient was last seen in primary care on 12/13/2023 by Katrinka Garnette KIDD, MD.  Called Nurse Triage reporting Cough.  Symptoms began several days ago.  Interventions attempted: Prescription medications: Inhaler.  Symptoms are: gradually worsening.  Triage Disposition: See HCP Within 4 Hours (Or PCP Triage)  Patient/caregiver understands and will follow disposition?: No, wishes to speak with PCP     Copied from CRM #[id number]. Topic: Clinical - Red Word Triage >> Dec 30, 2023  1:28 PM Rosina D wrote: Reason for RMF:izze cough and wheezing from asthma      Reason for Disposition  Wheezing is present  Answer Assessment - Initial Assessment Questions Patient refused urgent care and refused an appointment tomorrow with another provider, stating she only wants to be seen by Dr. Katrinka. Please advise.     1. ONSET: When did the cough begin?      2-3 days ago 2. SEVERITY: How bad is the cough today?      Mild to moderate  3. SPUTUM: Describe the color of your sputum (e.g., none, dry cough; clear, white, yellow, green)     No 4. HEMOPTYSIS: Are you coughing up any blood? If Yes, ask: How much? (e.g., flecks, streaks, tablespoons, etc.)     No 5. DIFFICULTY BREATHING: Are you having difficulty breathing? If Yes, ask: How bad is it? (e.g., mild, moderate, severe)      Mild shortness of breath 6. FEVER: Do you have a fever? If Yes, ask: What is your temperature, how was it measured, and when did it start?     No 7. CARDIAC HISTORY: Do you have any history of heart disease? (e.g., heart attack, congestive heart failure)      No 8. LUNG HISTORY: Do you have any history of lung disease?  (e.g., pulmonary embolus, asthma, emphysema)     Asthma  9. PE RISK  FACTORS: Do you have a history of blood clots? (or: recent major surgery, recent prolonged travel, bedridden)     No 10. OTHER SYMPTOMS: Do you have any other symptoms? (e.g., runny nose, wheezing, chest pain)       Wheezing  Protocols used: Cough - Acute Non-Productive-A-AH

## 2023-12-30 NOTE — Telephone Encounter (Signed)
 Please schedule ov with available provider In [place] absence.

## 2024-01-02 ENCOUNTER — Other Ambulatory Visit: Payer: Self-pay | Admitting: Family Medicine

## 2024-01-03 ENCOUNTER — Ambulatory Visit: Admitting: Physician Assistant

## 2024-01-10 ENCOUNTER — Telehealth: Payer: Self-pay | Admitting: Family Medicine

## 2024-01-10 NOTE — Telephone Encounter (Signed)
 Copied from CRM [id number]. Topic: General - Call Back - No Documentation >> Jan 10, 2024  4:44 PM Rea C wrote: Reason for CRM: Patient needs to leave a message for Dr. Katrinka to have physical therapy.  [phone] (M)

## 2024-01-10 NOTE — Telephone Encounter (Signed)
 Please advise on need for PT and I will send a referral.   Copied from CRM [id number]. Topic: General - Call Back - No Documentation >> Jan 10, 2024  4:44 PM Rea C wrote: Reason for CRM: Patient needs to leave a message for Dr. Katrinka to have physical therapy.  [phone] (M)

## 2024-01-11 ENCOUNTER — Other Ambulatory Visit: Payer: Self-pay | Admitting: Family Medicine

## 2024-01-11 DIAGNOSIS — M542 Cervicalgia: Secondary | ICD-10-CM

## 2024-01-11 NOTE — Telephone Encounter (Signed)
 Referral placed and patient is aware.

## 2024-01-19 ENCOUNTER — Ambulatory Visit: Admitting: Sports Medicine

## 2024-01-19 VITALS — BP 138/70 | HR 73 | Ht 61.0 in | Wt 154.0 lb

## 2024-01-19 DIAGNOSIS — M542 Cervicalgia: Secondary | ICD-10-CM

## 2024-01-19 DIAGNOSIS — M2550 Pain in unspecified joint: Secondary | ICD-10-CM | POA: Diagnosis not present

## 2024-01-19 DIAGNOSIS — M25572 Pain in left ankle and joints of left foot: Secondary | ICD-10-CM

## 2024-01-19 DIAGNOSIS — G8929 Other chronic pain: Secondary | ICD-10-CM

## 2024-01-19 DIAGNOSIS — M25472 Effusion, left ankle: Secondary | ICD-10-CM | POA: Diagnosis not present

## 2024-01-19 LAB — URIC ACID: Uric Acid, Serum: 6.6 mg/dL (ref 2.4–7.0)

## 2024-01-19 NOTE — Patient Instructions (Addendum)
 Thank you for coming in today.  Recommend reaching out to your cardiologist.  You can tell them that we are concerned for gout because of your foot and ankle swelling and elevated uric acid level.  You can asked them if there is an alternative to the hydrochlorothiazide  (HCTZ) medication that you are taking.  Please stop by our lab on the way out to get a uric acid level  Please continue to take allopurinol  50 mg daily  Continue home exercises for ankle and neck and we will refer to physical therapy  Follow-up in 4 weeks with either myself or with Dr. Joane.  Home exercises for neck and ankle. Physical therapy referral to [facility].

## 2024-01-19 NOTE — Progress Notes (Signed)
 Lisa Greene D.[name] [address] [place] [address] Phone: [phone]   Assessment and Plan:     1. Chronic pain of left ankle 2. Left ankle swelling - Chronic with exacerbation, subsequent visit - Continued left foot and ankle swelling and pain.  Overall improving with relative rest, starting allopurinol  50 mg daily.  Reassuring that patient had negative DVT vascular ultrasound.  Patient may be experiencing musculoskeletal foot and ankle pain with edema versus gout - Uric acid mildly elevated at 7.5 on 07/25/2023.  Patient has CKD with GFR 41 and is currently taking HCTZ.  We will recheck uric acid level today.  Recommend patient reach out to cardiology to see if they would recommend an alternative medication to HCTZ.  If patient must stay on HCTZ, could consider increasing allopurinol  if uric acid remains elevated today - Start HEP and physical therapy for foot and ankle  3. Neck pain  -Chronic with exacerbation, initial visit - Continued neck pain and stiffness - Recommend starting HEP and physical therapy  15 additional minutes spent for educating Therapeutic Home Exercise Program.  This included exercises focusing on stretching, strengthening, with focus on eccentric aspects.   Long term goals include an improvement in range of motion, strength, endurance as well as avoiding reinjury. Patient's frequency would include in 1-2 times a day, 3-5 times a week for a duration of 6-12 weeks. Proper technique shown and discussed handout in great detail with ATC.  All questions were discussed and answered.    Pertinent previous records reviewed include lab work from 12/22/2023  Follow Up: 4 weeks for reevaluation.  Could consider compression stockings versus adjusting allopurinol  medication   Subjective:    Chief Complaint: left ankle pain  HPI:  12/22/23 Patient saw Dr. Joane for chronic pain in the left ankle.   01/19/24 Patient states  that the left ankle is better. It still swells and the leg still swells. It still hurts as well. Patient states she does elevate it throughout the day when she is not doing anything. It is not often because she stays busy.  Relevant Historical Information: CKD, hypertension,  Additional pertinent review of systems negative.   Current Outpatient Medications:    Albuterol  Sulfate (PROAIR  RESPICLICK) 108 (90 Base) MCG/ACT AEPB, Inhale 1 puff into the lungs every 8 (eight) hours as needed (wheezing/shortness of breath)., Disp: , Rfl:    allopurinol  (ZYLOPRIM ) 100 MG tablet, Take 0.5 tablets (50 mg total) by mouth daily., Disp: 30 tablet, Rfl: 3   atorvastatin  (LIPITOR) 20 MG tablet, Take 1 tablet (20 mg total) by mouth daily., Disp: 90 tablet, Rfl: 3   azelastine (OPTIVAR) 0.05 % ophthalmic solution, , Disp: , Rfl:    BREZTRI AEROSPHERE 160-9-4.8 MCG/ACT AERO inhaler, Inhale into the lungs., Disp: , Rfl:    cetirizine (ZYRTEC) 10 MG tablet, Take 10 mg by mouth daily., Disp: , Rfl:    cholecalciferol (VITAMIN D ) 1000 UNITS tablet, Take 2,000 Units by mouth daily., Disp: , Rfl:    Cyanocobalamin  (VITAMIN B 12 PO), Take 1,000 mg by mouth every other day., Disp: , Rfl:    denosumab  (PROLIA ) 60 MG/ML SOSY injection, INJECT 60 MG INTO THE SKIN EVERY 6 (SIX) MONTHS., Disp: 1 mL, Rfl: 1   fluticasone  (FLONASE ) 50 MCG/ACT nasal spray, Place 2 sprays into both nostrils daily., Disp: 48 g, Rfl: 3   hydrochlorothiazide  (MICROZIDE ) 12.5 MG capsule, Take 1 capsule (12.5 mg total) by mouth every Monday, Wednesday,  and Friday. AS DIRECTED, Disp: 36 capsule, Rfl: 3   irbesartan  (AVAPRO ) 150 MG tablet, TAKE 1 TABLET BY MOUTH DAILY, Disp: 90 tablet, Rfl: 3   metoprolol  succinate (TOPROL -XL) 50 MG 24 hr tablet, TAKE 1 TABLET BY MOUTH DAILY, Disp: 90 tablet, Rfl: 3   montelukast (SINGULAIR) 10 MG tablet, Take 1 tablet by mouth at bedtime., Disp: , Rfl:   Current Facility-Administered Medications:    denosumab   (PROLIA ) injection 60 mg, 60 mg, Subcutaneous, Q6 months, Wendolyn Jenkins Jansky, MD, 60 mg at 09/14/23 0912   [START ON 03/15/2024] denosumab  (PROLIA ) injection 60 mg, 60 mg, Subcutaneous, Once, Wendolyn Jenkins Jansky, MD   NOREEN ON 03/15/2024] denosumab  (PROLIA ) injection 60 mg, 60 mg, Subcutaneous, Once, Wendolyn Jenkins Jansky, MD   Objective:     Vitals:   01/19/24 1441  BP: 138/70  Pulse: 73  SpO2: 97%  Weight: 154 lb (69.9 kg)  Height: 5' 1 (1.549 m)      Body mass index is 29.1 kg/m.    Physical Exam:    Gen: Appears well, nad, nontoxic and pleasant Psych: Alert and oriented, appropriate mood and affect Neuro: sensation intact, strength is 5/5 with df/pf/inv/ev, muscle tone wnl Skin: no susupicious lesions or rashes  Left foot/ankle:  No deformity, erythema, warmth.  Capillary refill intact 2+ pitting edema from mid shin and distal Generalized TTP with foot and ankle edema with areas of maximal tenderness along lateral malleolus, ATFL ROM DF 20, PF 25, inv/ev intact but reduced  Electronically signed by:  Odis Mace D.[name] 3:13 PM 01/19/24

## 2024-01-20 ENCOUNTER — Telehealth: Payer: Self-pay | Admitting: Internal Medicine

## 2024-01-20 ENCOUNTER — Encounter: Payer: Self-pay | Admitting: Physical Therapy

## 2024-01-20 ENCOUNTER — Ambulatory Visit: Payer: Self-pay | Admitting: Sports Medicine

## 2024-01-20 ENCOUNTER — Ambulatory Visit: Admitting: Physical Therapy

## 2024-01-20 DIAGNOSIS — M79605 Pain in left leg: Secondary | ICD-10-CM

## 2024-01-20 DIAGNOSIS — M25572 Pain in left ankle and joints of left foot: Secondary | ICD-10-CM | POA: Diagnosis not present

## 2024-01-20 DIAGNOSIS — M542 Cervicalgia: Secondary | ICD-10-CM | POA: Diagnosis not present

## 2024-01-20 NOTE — Telephone Encounter (Signed)
 Pt c/o medication issue:  1. Name of Medication:   hydrochlorothiazide  (MICROZIDE ) 12.5 MG capsule    2. How are you currently taking this medication (dosage and times per day)? Take 1 capsule (12.5 mg total) by mouth every Monday, Wednesday, and Friday. AS DIRECTED   3. Are you having a reaction (difficulty breathing--STAT)? No  4. What is your medication issue? Pt got blood work done yesterday and provider that prescribed blood work would like to see if changing the above medication to higher dose would bring down uric acid levels. Please advise.

## 2024-01-20 NOTE — Therapy (Signed)
 OUTPATIENT PHYSICAL THERAPY LOWER EXTREMITY EVALUATION   Patient Name: Lisa Greene MRN: [id number] DOB:1941-01-07, 83 y.o., female Today's Date: 01/20/2024  END OF SESSION:  PT End of Session - 01/20/24 1459     Visit Number 1    Number of Visits 16    Date for PT Re-Evaluation 03/16/24    Authorization Type [employer]    PT Start Time 1435    PT Stop Time 1510    PT Time Calculation (min) 35 min    Activity Tolerance Patient tolerated treatment well    Behavior During Therapy [facility] for tasks assessed/performed          Past Medical History:  Diagnosis Date   ALLERGIC RHINITIS 01/31/2008   Allergy 1960   Asthma 2019   BCC (basal cell carcinoma)sup& nod 03/01/2017   right nostril   Breast cancer of lower-inner quadrant of left female breast (HCC) 11/01/2015   treated with lumpectomy and radiation   Cataract    Colon cancer screening 05/21/2014   No polyps at age 33. Diverticulosis alone-no more colonoscopies.     DIVERTICULITIS, HX OF 09/07/2008   pt denies this    Eosinophilia 02/08/2009   Fibroid    Heart murmur    HYPERLIPIDEMIA 12/20/2006   Hypertension    OSTEOPOROSIS 12/20/2006   PVC's (premature ventricular contractions)    SCC (squamous cell carcinoma) well diff 11/28/2013   left side chin   Vitamin B 12 deficiency    Past Surgical History:  Procedure Laterality Date   BREAST LUMPECTOMY WITH NEEDLE LOCALIZATION Left 01/03/2016   Procedure: BREAST re-excision LUMPECTOMY WITH NEEDLE LOCALIZATION;  Surgeon: Donnice Bury, MD;  Location: [facility];  Service: General;  Laterality: Left;  BREAST re-excision LUMPECTOMY WITH NEEDLE LOCALIZATION   BREAST LUMPECTOMY WITH RADIOACTIVE SEED LOCALIZATION Left 11/28/2015   Procedure: LEFT BREAST LUMPECTOMY WITH BRACKETED RADIOACTIVE SEED LOCALIZATION;  Surgeon: Donnice Bury, MD;  Location: [facility];  Service: General;  Laterality: Left;   BREAST SURGERY  2017   cysts  removed x2   CATARACT EXTRACTION BILATERAL W/ ANTERIOR VITRECTOMY  2013   bilateral cataracts   COLONOSCOPY  2005   tics only    FOOT SURGERY     IR KYPHO LUMBAR INC FX REDUCE BONE BX UNI/BIL CANNULATION INC/IMAGING  05/28/2022   IR RADIOLOGIST EVAL & MGMT  05/21/2022   KNEE ARTHROSCOPY     left   NEUROMA SURGERY     x2 feet   thyroid  duct cyst     Patient Active Problem List   Diagnosis Date Noted   Lumbar facet arthropathy 08/11/2022   Closed compression fracture of L2 lumbar vertebra, with routine healing, subsequent encounter 08/11/2022   HSV-1 (herpes simplex virus 1) infection 04/28/2022   Pain in left ankle and joints of left foot 06/18/2021   Vasomotor rhinitis 05/10/2019   Chronic allergic conjunctivitis 05/10/2019   PVC (premature ventricular contraction) 04/14/2017   Aortic atherosclerosis (HCC) 11/23/2016   Low vitamin B12 level 06/24/2016   History of breast cancer- Breast cancer of lower-inner quadrant of left female breast 11/01/2015   Stage 3b chronic kidney disease (HCC) 10/22/2015   Family history of abdominal aortic aneurysm 03/08/2015   Asthma, moderate persistent, well-controlled 04/19/2014   Hypertension 11/25/2010   Eosinophilia 02/08/2009   Allergic rhinitis 01/31/2008   Hyperlipidemia 12/20/2006   Osteoporosis 12/20/2006     PCP: Garnette Lukes  REFERRING PROVIDER: Odis Mace  REFERRING DIAG: Pain in L ankle,  Neck pain   THERAPY DIAG:  Pain in left ankle and joints of left foot  Pain in left leg  Cervicalgia  Rationale for Evaluation and Treatment: Rehabilitation  ONSET DATE:    SUBJECTIVE:   SUBJECTIVE STATEMENT: Neck Pain: increased pain for about 2 months, no incident to report. Woke up and couldn't turn head, thought it would get better. R side still hurts, mostly with with R rotation, feels limited, Also hurts on R with L rotation. Difficulty laying on back due to neck pain. Increased pain In back of head.   L foot and lower  leg started swelling about 2-3 months. Sometimes painful- up to 10/10. Can also be fine. Painful daily. Standing is worst pain.    PERTINENT HISTORY: CKD, breast CA, L2 comp fracture, Osteoporosis  PAIN:  Are you having pain? Yes: NPRS scale: 8/10 Pain location: Neck  Pain description: sore, stiff Aggravating factors: movement, rotation Relieving factors: none stated   Are you having pain? Yes: NPRS scale: up to 10/10 Pain location: Foot Left  Pain description: swollen, painful Aggravating factors: Standing, going down steps  Relieving factors: none stated    PRECAUTIONS: None  WEIGHT BEARING RESTRICTIONS: No  FALLS:  Has patient fallen in last 6 months? No   PLOF: Independent  PATIENT GOALS:  Decreased pain in neck and foot   NEXT MD VISIT:   OBJECTIVE:   DIAGNOSTIC FINDINGS:   PATIENT SURVEYS:    COGNITION: Overall cognitive status: Within functional limits for tasks assessed     SENSATION: WFL  EDEMA: Mild in L foot,   mild/mod in L lower leg    POSTURE:    No Significant postural limitations  PALPATION:   LOWER EXTREMITY ROM: Cervical Flexion: WFL,   L rot: 62, R rot: 55 , Ext: mod/sign limitation/ pain Shoulders: WFL/mild limitation for flexion  Ankle:  mild limitation for DF,  other WFL .    LOWER EXTREMITY MMT:  MMT Left eval Right  eval  Shoulder  flexion 4 4   extension    Shoulder   abduction 4 4  adduction    Shouder internal rotation    Shoulder  external rotation    Knee flexion    Knee extension    Ankle dorsiflexion 4   Ankle plantarflexion 4   Ankle inversion 4   Ankle eversion 4    (Blank rows = not tested)  SPECIAL TESTS:   FUNCTIONAL TESTS:    GAIT: Unremarkable    TODAY'S TREATMENT:                                                                                                                              DATE:  Eval:  Ther ex: See below for HEP   PATIENT EDUCATION:  Education details: PT POC, Exam  findings, HEP for neck and ankle -(has from MD)  Person educated: Patient Education method: Explanation, Demonstration, Tactile cues, Verbal cues, and Handouts Education  comprehension: verbalized understanding, returned demonstration, verbal cues required, tactile cues required, and needs further education   HOME EXERCISE PROGRAM: Access Code: [email or web address] URL: [email or web address] Date: 01/26/2024 Prepared by: Tinnie Don  Exercises - Supine Cervical Rotation AROM on Pillow  - 2 x daily - 1 sets - 10 reps - Seated Cervical Flexion AROM  - 2 x daily - 1 sets - 5- 10 reps  ASSESSMENT:  CLINICAL IMPRESSION: Patient presents with primary complaint of  pain in neck and L foot . She has had swelling and pain in L lower leg and foot. Is a bit better this week, but has been ongoing. She has minimal pain to palpate or test today, but overall mild weakness in foot. She has stiffness in neck, with decreased ROM for rotation. Pt with decreased ability for full functional activities. Pt will  benefit from skilled PT to improve deficits and pain and to return to PLOF.   OBJECTIVE IMPAIRMENTS: decreased activity tolerance, decreased mobility, decreased ROM, decreased strength, increased muscle spasms, impaired flexibility, improper body mechanics, and pain.   ACTIVITY LIMITATIONS: lifting, standing, squatting, stairs, and locomotion level  PARTICIPATION LIMITATIONS: meal prep, cleaning, laundry, driving, shopping, and community activity  PERSONAL FACTORS: Past/current experiences and Time since onset of injury/illness/exacerbation are also affecting patient's functional outcome.   REHAB POTENTIAL: Good  CLINICAL DECISION MAKING: Evolving/moderate complexity  EVALUATION COMPLEXITY: Moderate   GOALS: Goals reviewed with patient? Yes  SHORT TERM GOALS: Target date: 02/10/2024  Pt to be independent with initial HEP  Goal status: INITIAL  2.  Pt to be compliant with compression  sock wearing for L le swelling.   Goal status: INITIAL   LONG TERM GOALS: Target date: 03/16/2024   Pt to be independent with final HEP  Goal status: INITIAL  2.  Pt to demo improved neck rotation to be equal on L and R, without pain, to improve ability for driving and IADLs.   Goal status: INITIAL  3.  Pt to report decreased pain in neck and foot by at least 2 points.   Goal status: INITIAL  4.  Pt to demo ability for ambulation and stairs without pain in L foot, to improve ability for community activity.   Goal status: INITIAL    PLAN:  PT FREQUENCY: 1-2x/week  PT DURATION: 8 weeks  PLANNED INTERVENTIONS: Therapeutic exercises, Therapeutic activity, Neuromuscular re-education, Patient/Family education, Self Care, Joint mobilization, Joint manipulation, Stair training, Orthotic/Fit training, DME instructions, Aquatic Therapy, Dry Needling, Electrical stimulation, Cryotherapy, Moist heat, Taping, Ultrasound, Ionotophoresis 4mg /ml Dexamethasone , Manual therapy,  Vasopneumatic device, Traction, Spinal manipulation, Spinal mobilization,Balance training, Gait training,   PLAN FOR NEXT SESSION:    Tinnie Don, PT, DPT 3:00 PM  01/20/24

## 2024-01-20 NOTE — Telephone Encounter (Signed)
 Spoke with pt, her uric acid level is elevated and her orthopedic doctor thinks the hydrochlorothiazide  maybe causing that and ask if it could be adjusted. Patient given the okay to stop the hydrochlorothiazide  and to track her blood pressure and let us  know if it is consistently above 130/85.

## 2024-01-24 ENCOUNTER — Encounter: Payer: Self-pay | Admitting: Family Medicine

## 2024-01-24 ENCOUNTER — Ambulatory Visit (INDEPENDENT_AMBULATORY_CARE_PROVIDER_SITE_OTHER): Payer: Medicare Other | Admitting: Family Medicine

## 2024-01-24 VITALS — BP 128/70 | HR 71 | Temp 97.5°F | Ht 61.0 in | Wt 151.4 lb

## 2024-01-24 DIAGNOSIS — R059 Cough, unspecified: Secondary | ICD-10-CM | POA: Diagnosis not present

## 2024-01-24 DIAGNOSIS — B9689 Other specified bacterial agents as the cause of diseases classified elsewhere: Secondary | ICD-10-CM

## 2024-01-24 DIAGNOSIS — I10 Essential (primary) hypertension: Secondary | ICD-10-CM | POA: Diagnosis not present

## 2024-01-24 DIAGNOSIS — J329 Chronic sinusitis, unspecified: Secondary | ICD-10-CM | POA: Diagnosis not present

## 2024-01-24 DIAGNOSIS — E785 Hyperlipidemia, unspecified: Secondary | ICD-10-CM

## 2024-01-24 MED ORDER — AMOXICILLIN-POT CLAVULANATE 875-125 MG PO TABS: 1.0000 | ORAL_TABLET | Freq: Two times a day (BID) | ORAL | 0 refills | Status: AC

## 2024-01-24 NOTE — Patient Instructions (Addendum)
 1 month of cough/throat congestoin/sinus pressure with double sickening concerning for bacterial sinusitis - has tolerated Augmentin  in the past though penicillin allergy listed- we will retrial this- if fails to improve in 10-14 days or worsens she will reach out and we can do x-ray. I do not see evidence this is her asthma but could reach out to allergy team as alternate.   No other changes today- glad blood pressure looks ok off of hydrochlorothiazide    Recommended follow up: Return in about 6 months (around 07/26/2024) for physical or sooner if needed.Schedule b4 you leave. -unless we need to check in sooner such as if not improving

## 2024-01-24 NOTE — Progress Notes (Signed)
 Phone [phone] In person visit   Subjective:   Lisa Greene is a 83 y.o. year old very pleasant female patient who presents for/with See problem oriented charting Chief Complaint  Patient presents with   Hyperlipidemia   Hypertension   Past Medical History-  Patient Active Problem List   Diagnosis Date Noted   PVC (premature ventricular contraction) 04/14/2017    Priority: High   History of breast cancer- Breast cancer of lower-inner quadrant of left female breast 11/01/2015    Priority: High   Low vitamin B12 level 06/24/2016    Priority: Medium    Stage 3b chronic kidney disease (HCC) 10/22/2015    Priority: Medium    Asthma, moderate persistent, well-controlled 04/19/2014    Priority: Medium    Hypertension 11/25/2010    Priority: Medium    Hyperlipidemia 12/20/2006    Priority: Medium    Osteoporosis 12/20/2006    Priority: Medium    HSV-1 (herpes simplex virus 1) infection 04/28/2022    Priority: Low   Vasomotor rhinitis 05/10/2019    Priority: Low   Chronic allergic conjunctivitis 05/10/2019    Priority: Low   Aortic atherosclerosis (HCC) 11/23/2016    Priority: Low   Family history of abdominal aortic aneurysm 03/08/2015    Priority: Low   Eosinophilia 02/08/2009    Priority: Low   Allergic rhinitis 01/31/2008    Priority: Low   Lumbar facet arthropathy 08/11/2022    Priority: 1.   Closed compression fracture of L2 lumbar vertebra, with routine healing, subsequent encounter 08/11/2022    Priority: 1.   Pain in left ankle and joints of left foot 06/18/2021    Priority: 1.    Medications- reviewed and updated Current Outpatient Medications  Medication Sig Dispense Refill   Albuterol  Sulfate (PROAIR  RESPICLICK) 108 (90 Base) MCG/ACT AEPB Inhale 1 puff into the lungs every 8 (eight) hours as needed (wheezing/shortness of breath).     allopurinol  (ZYLOPRIM ) 100 MG tablet Take 0.5 tablets (50 mg total) by mouth daily. 30 tablet 3    amoxicillin -clavulanate (AUGMENTIN ) 875-125 MG tablet Take 1 tablet by mouth 2 (two) times daily for 7 days. 14 tablet 0   atorvastatin  (LIPITOR) 20 MG tablet Take 1 tablet (20 mg total) by mouth daily. 90 tablet 3   azelastine (OPTIVAR) 0.05 % ophthalmic solution      BREZTRI AEROSPHERE 160-9-4.8 MCG/ACT AERO inhaler Inhale into the lungs.     cetirizine (ZYRTEC) 10 MG tablet Take 10 mg by mouth daily.     cholecalciferol (VITAMIN D ) 1000 UNITS tablet Take 2,000 Units by mouth daily.     Cyanocobalamin  (VITAMIN B 12 PO) Take 1,000 mg by mouth every other day.     denosumab  (PROLIA ) 60 MG/ML SOSY injection INJECT 60 MG INTO THE SKIN EVERY 6 (SIX) MONTHS. 1 mL 1   fluticasone  (FLONASE ) 50 MCG/ACT nasal spray Place 2 sprays into both nostrils daily. 48 g 3   irbesartan  (AVAPRO ) 150 MG tablet TAKE 1 TABLET BY MOUTH DAILY 90 tablet 3   metoprolol  succinate (TOPROL -XL) 50 MG 24 hr tablet TAKE 1 TABLET BY MOUTH DAILY 90 tablet 3   montelukast (SINGULAIR) 10 MG tablet Take 1 tablet by mouth at bedtime.     Current Facility-Administered Medications  Medication Dose Route Frequency Provider Last Rate Last Admin   denosumab  (PROLIA ) injection 60 mg  60 mg Subcutaneous Q6 months Wendolyn Jenkins Jansky, MD   60 mg at 09/14/23 0912   [START ON 03/15/2024] denosumab  (  PROLIA ) injection 60 mg  60 mg Subcutaneous Once Kulik, Ann Marie, MD       NOREEN ON 03/15/2024] denosumab  (PROLIA ) injection 60 mg  60 mg Subcutaneous Once Kulik, Ann Marie, MD         Objective:  BP 128/70 (BP Location: Left Arm, Patient Position: Sitting, Cuff Size: Normal)   Pulse 71   Temp (!) 97.5 F (36.4 C) (Temporal)   Ht 5' 1 (1.549 m)   Wt 151 lb 6.4 oz (68.7 kg)   SpO2 98%   BMI 28.61 kg/m  Gen: NAD, resting comfortably Nasal turbinate edema on the right and slightly on the left. Maxillary sinus pressure bilaterally and on the left frontal sinus. Some drainage in pharynx noted. Tympanic membrane normal bilaterally  CV: regular  rate no murmurs rubs or gallops Lungs: CTAB no crackles, wheeze, rhonchi Ext: no edema Skin: warm, dry     Assessment and Plan    # Left leg edema and pain-see last visit 12/13/2023 and thankfully venous duplex showed no DVT.  Had occurred after prior trauma to the leg and we suspected that was most likely cause -Her uric acid levels were later elevated when seen by sports medicine up to 7.5 and she was started on allopurinol  50 mg with levels improving for 7.5-6.6 with plan for repeat check late September and they also opted to stop the hydrochlorothiazide  to see if that would help  -has tried compression stockings as well - repeat uric acid  planned with sports medicine   # Bilateral neck pain without midline pain-noted at last visit as well-she wanted to try a new pillow and heat and see physical therapy if fail to improve.  She also declined x-rays initially - Later referred to physical therapy and also working on ankle  # Cough S:going on for about a month. Wondered if allergies or cold at first. No wheeze. No shortness of breath .  Can get in coughing spells. Albuterol  has not helped the cough. Still taking singulair and zyrtec and Flonase .  Seems to be improving but somedays has bad days. Some maxillary sinus pressure. Feels congested in throat.  -does not feel typical for her asthma and she is taking the Breztri regularly -has had improvement at times then worsening again- double sickening A/P: 1 month of cough/throat congestoin/sinus pressure with double sickening concerning for bacterial sinusitis - has tolerated Augmentin  in the past though penicillin allergy listed- we will retrial this- if fails to improve in 10-14 days or worsens she will reach out and we can do x-ray. I do not see evidence this is her asthma but could reach out to allergy team as alternate.   #hyperlipidemia S: Medication:atorvastatin  20 mg daily Lab Results  Component Value Date   CHOL 163 07/26/2023   HDL  76.50 07/26/2023   LDLCALC 68 07/26/2023   LDLDIRECT 59.0 10/17/2019   TRIG 96.0 07/26/2023   CHOLHDL 2 07/26/2023  A/P: HLD-well controlled continue current medications   #hypertension with white coat element/PVCs on metoprolol  S: medication: irbesartan  150mg , metoprolol  50 mg XR -prior hydrochlorothiazide  12.5 mg Monday Wednesday Friday -Edema on amlodipine  and with HCTZ in the past had mild hyponatremia plus gout issues  Home readings #s: some cuff issues but most recent read 124  A/P: blood pressure looks great even without he hydrochlorothiazide  today- continue current medications   Recommended follow up: Return in about 6 months (around 07/26/2024) for physical or sooner if needed.Schedule b4 you leave. Future Appointments  Date  Time Provider Department Center  02/09/2024  1:00 PM Dow Maxwell, PT [facility] None  02/11/2024 11:00 AM Dow Maxwell, PT [facility] None  02/14/2024 11:00 AM Dow Maxwell, PT [facility] None  02/16/2024  2:00 PM Leonce Katz, DO [facility] None  02/17/2024 11:00 AM Dow Maxwell, PT [facility] None  12/11/2024  9:20 AM [facility] ANNUAL WELLNESS VISIT 1 [facility]    Lab/Order associations:   ICD-10-CM   1. Bacterial sinusitis  J32.9    B96.89     2. Primary hypertension  I10     3. Hyperlipidemia, unspecified hyperlipidemia type  E78.5     4. Cough, unspecified type  R05.9       Meds ordered this encounter  Medications   amoxicillin -clavulanate (AUGMENTIN ) 875-125 MG tablet    Sig: Take 1 tablet by mouth 2 (two) times daily for 7 days.    Dispense:  14 tablet    Refill:  0    Return precautions advised.  Garnette Lukes, MD

## 2024-02-07 ENCOUNTER — Ambulatory Visit (INDEPENDENT_AMBULATORY_CARE_PROVIDER_SITE_OTHER): Admitting: Family Medicine

## 2024-02-07 ENCOUNTER — Ambulatory Visit

## 2024-02-07 ENCOUNTER — Ambulatory Visit: Admitting: Family Medicine

## 2024-02-07 ENCOUNTER — Encounter: Payer: Self-pay | Admitting: Family Medicine

## 2024-02-07 VITALS — BP 129/72 | HR 71 | Temp 97.4°F | Ht 61.0 in | Wt 154.2 lb

## 2024-02-07 DIAGNOSIS — R053 Chronic cough: Secondary | ICD-10-CM

## 2024-02-07 DIAGNOSIS — I10 Essential (primary) hypertension: Secondary | ICD-10-CM

## 2024-02-07 DIAGNOSIS — R059 Cough, unspecified: Secondary | ICD-10-CM | POA: Diagnosis not present

## 2024-02-07 DIAGNOSIS — K449 Diaphragmatic hernia without obstruction or gangrene: Secondary | ICD-10-CM | POA: Diagnosis not present

## 2024-02-07 DIAGNOSIS — J4540 Moderate persistent asthma, uncomplicated: Secondary | ICD-10-CM | POA: Diagnosis not present

## 2024-02-07 DIAGNOSIS — N1832 Chronic kidney disease, stage 3b: Secondary | ICD-10-CM | POA: Diagnosis not present

## 2024-02-07 DIAGNOSIS — I700 Atherosclerosis of aorta: Secondary | ICD-10-CM | POA: Diagnosis not present

## 2024-02-07 DIAGNOSIS — I517 Cardiomegaly: Secondary | ICD-10-CM | POA: Diagnosis not present

## 2024-02-07 DIAGNOSIS — E785 Hyperlipidemia, unspecified: Secondary | ICD-10-CM | POA: Diagnosis not present

## 2024-02-07 MED ORDER — OMEPRAZOLE 40 MG PO CPDR: 40.0000 mg | DELAYED_RELEASE_CAPSULE | Freq: Every day | ORAL | 3 refills | Status: AC

## 2024-02-07 NOTE — Patient Instructions (Addendum)
 Chest x-ray today before you leave  Considering prednisone  if x-ray reassuring  Trying reflux medication(s) omeprazole  for 1-2 months to see if helps chronic cough as well  May strongly / carefully consider going back to breo if have ongoing frustrations with current treatment  Recommended follow up: Return for next already scheduled visit or sooner if needed. fds

## 2024-02-07 NOTE — Progress Notes (Signed)
 Phone [phone] In person visit   Subjective:   Lisa Greene is a 83 y.o. year old very pleasant female patient who presents for/with See problem oriented charting Chief Complaint  Patient presents with   Medical Management of Chronic Issues    Medication check;    Discussed the use of AI scribe software for clinical note transcription with the patient, who gave verbal consent to proceed.  History of Present Illness   Jonica Bickhart Dalia Jollie is an 83 year old female who presents with a persistent cough and sinus issues.  She has been experiencing a persistent cough for approximately six weeks. Initially, the cough was accompanied by throat congestion and sinus pressure, which improved by 75-80% after treatment with Augmentin . However, the cough has returned, though the sinus pressure has not recurred. The cough is intermittent, and she is uncertain if it is related to allergies or other factors.no shortness of breath or fever.   She has a history of asthma and is currently using a Breztri inhaler, which she feels may cause a slight wheeze after use. She previously used Breo and found it more effective. She also takes Singulair 10 mg daily for asthma management.  She has a history of a hiatal hernia and has experienced similar coughs in the past, which resolved for 3 months after steroid treatment. No symptoms of acid reflux other than possible silent reflux related chronic ocugh.  Her current medications for allergies include Astelin, Flonase , and Zyrtec, which she takes regularly. Despite this, she continues to experience an intermittent cough.  She has a history of hyperlipidemia, hypertension, and chronic kidney disease stage three. Her current medications include atorvastatin  20 mg for hyperlipidemia, irbesartan  150 mg, and metoprolol  50 mg extended release for hypertension. She previously took hydrochlorothiazide  but discontinued it due to hyponatremia and gout issues. She also  avoids NSAIDs due to her kidney condition.  She mentions a history of gout and is currently taking allopurinol  50mg . She notes occasional swelling around her ankle on left, which she attributes to prior swelling episodes- has not worsened      Past Medical History-  Patient Active Problem List   Diagnosis Date Noted   PVC (premature ventricular contraction) 04/14/2017    Priority: High   History of breast cancer- Breast cancer of lower-inner quadrant of left female breast 11/01/2015    Priority: High   Low vitamin B12 level 06/24/2016    Priority: Medium    Stage 3b chronic kidney disease (HCC) 10/22/2015    Priority: Medium    Asthma, moderate persistent, well-controlled 04/19/2014    Priority: Medium    Hypertension 11/25/2010    Priority: Medium    Hyperlipidemia 12/20/2006    Priority: Medium    Osteoporosis 12/20/2006    Priority: Medium    HSV-1 (herpes simplex virus 1) infection 04/28/2022    Priority: Low   Vasomotor rhinitis 05/10/2019    Priority: Low   Chronic allergic conjunctivitis 05/10/2019    Priority: Low   Aortic atherosclerosis (HCC) 11/23/2016    Priority: Low   Family history of abdominal aortic aneurysm 03/08/2015    Priority: Low   Eosinophilia 02/08/2009    Priority: Low   Allergic rhinitis 01/31/2008    Priority: Low   Lumbar facet arthropathy 08/11/2022    Priority: 1.   Closed compression fracture of L2 lumbar vertebra, with routine healing, subsequent encounter 08/11/2022    Priority: 1.   Pain in left ankle and joints of left foot  06/18/2021    Priority: 1.    Medications- reviewed and updated Current Outpatient Medications  Medication Sig Dispense Refill   Albuterol  Sulfate (PROAIR  RESPICLICK) 108 (90 Base) MCG/ACT AEPB Inhale 1 puff into the lungs every 8 (eight) hours as needed (wheezing/shortness of breath).     allopurinol  (ZYLOPRIM ) 100 MG tablet Take 0.5 tablets (50 mg total) by mouth daily. 30 tablet 3   atorvastatin  (LIPITOR) 20  MG tablet Take 1 tablet (20 mg total) by mouth daily. 90 tablet 3   azelastine (OPTIVAR) 0.05 % ophthalmic solution      BREZTRI AEROSPHERE 160-9-4.8 MCG/ACT AERO inhaler Inhale into the lungs.     cetirizine (ZYRTEC) 10 MG tablet Take 10 mg by mouth daily.     cholecalciferol (VITAMIN D ) 1000 UNITS tablet Take 2,000 Units by mouth daily.     Cyanocobalamin  (VITAMIN B 12 PO) Take 1,000 mg by mouth every other day.     denosumab  (PROLIA ) 60 MG/ML SOSY injection INJECT 60 MG INTO THE SKIN EVERY 6 (SIX) MONTHS. 1 mL 1   fluticasone  (FLONASE ) 50 MCG/ACT nasal spray Place 2 sprays into both nostrils daily. 48 g 3   irbesartan  (AVAPRO ) 150 MG tablet TAKE 1 TABLET BY MOUTH DAILY 90 tablet 3   metoprolol  succinate (TOPROL -XL) 50 MG 24 hr tablet TAKE 1 TABLET BY MOUTH DAILY 90 tablet 3   montelukast (SINGULAIR) 10 MG tablet Take 1 tablet by mouth at bedtime.     omeprazole  (PRILOSEC) 40 MG capsule Take 1 capsule (40 mg total) by mouth daily. Treating for reflux potentially causing cough- does not have traditional reflux symptoms 30 capsule 3   Current Facility-Administered Medications  Medication Dose Route Frequency Provider Last Rate Last Admin   denosumab  (PROLIA ) injection 60 mg  60 mg Subcutaneous Q6 months Wendolyn Jenkins Jansky, MD   60 mg at 09/14/23 0912   [START ON 03/15/2024] denosumab  (PROLIA ) injection 60 mg  60 mg Subcutaneous Once Kulik, Ann Marie, MD       [START ON 03/15/2024] denosumab  (PROLIA ) injection 60 mg  60 mg Subcutaneous Once Kulik, Ann Marie, MD         Objective:  BP 129/72 Comment: most recent home reading  Pulse 71   Temp (!) 97.4 F (36.3 C) (Temporal)   Ht 5' 1 (1.549 m)   Wt 154 lb 3.2 oz (69.9 kg)   SpO2 96%   BMI 29.14 kg/m  Gen: NAD, resting comfortably No sinus tenderness.  Turbinates mildly edematous but not erythematous.  Pharynx largely reassuring other than slight drainage CV: RRR no murmurs rubs or gallops Lungs: CTAB no crackles, wheeze, rhonchi Ext:  Trace edema left greater than right Skin: warm, dry     Assessment and Plan       #Chronic cough -persisting for almost two months. Initial improvement with Augmentin , but symptoms returned post-treatment. Differential includes atypical pneumonia, allergies, and reflux-related cough. Sinus issues seem resolved, and allergies are well-managed with current medications though we did consider prednisone  with prior course of treatment if x-ray ok. Discussed the possibility of atypical bacteria not covered by Augmentin  and the potential role of reflux in the cough. - Order chest x-ray to rule out atypical pneumonia - Initiate omeprazole  40 mg for potential reflux-related cough -plan on steroids if x-ray reassuring  #Hiatal hernia with possible reflux-related cough Hiatal hernia present. Possible contribution to chronic cough through reflux, despite absence of traditional reflux symptoms. Discussed the potential for reflux to cause cough even without  typical symptoms. - Initiate omeprazole  40 mg to address potential reflux-related cough  #Asthma Asthma managed with Breztri inhaler. Reports wheezing post-inhalation, suggesting possible intolerance to the Aerosphere formulation. Previously did better on Breo. - Consider switching back to [facility] if symptoms persist- can ask Dr. Frutoso -also we are considering prednisone  which would help allergies and asthma though asthma does not appear flared- no wheeze  #Allergic rhinitis Allergic rhinitis managed with Astelin, Flonase , and Zyrtec. Symptoms well-controlled with current regimen. consider prednisone   #Chronic kidney disease stage 3b -with recent GFR of 41. She is aware of the need to avoid NSAIDs. -also add UACR   #Gout Gout managed with allopurinol  50 mg with no recent flare.  Previous use of hydrochlorothiazide  discontinued due to risk of gout exacerbation. Occasional swelling noted, possibly residual from prior episodes. too early for uric  acid repeat  #Hypertension Hypertension well-controlled with irbesartan  150 mg and metoprolol  50 mg extended release. Recent home blood pressure readings are excellent. continue current medication  #Hyperlipidemia Hyperlipidemia well-controlled with atorvastatin  20 mg. Recent LDL was 68, meeting the goal of less than 70.      Recommended follow up: Return for next already scheduled visit or sooner if needed. Future Appointments  Date Time Provider Department Center  02/09/2024  1:00 PM Dow Maxwell, PT [facility] None  02/11/2024 11:00 AM Dow Maxwell, PT [facility] None  02/14/2024 11:00 AM Dow Maxwell, PT [facility] None  02/16/2024  2:00 PM Leonce Katz, DO [facility] None  02/17/2024 11:00 AM Dow Maxwell, PT [facility] None  08/16/2024  8:00 AM Katrinka Garnette KIDD, MD [facility]  12/11/2024  9:20 AM [facility] ANNUAL WELLNESS VISIT 1 [facility] [place]    Lab/Order associations:   ICD-10-CM   1. Chronic cough  R05.3 DG Chest 2 View    2. Hyperlipidemia, unspecified hyperlipidemia type  E78.5     3. Stage 3b chronic kidney disease (HCC)  N18.32 Microalbumin / creatinine urine ratio    4. Primary hypertension  I10     5. Asthma, moderate persistent, well-controlled  J45.40       Meds ordered this encounter  Medications   omeprazole  (PRILOSEC) 40 MG capsule    Sig: Take 1 capsule (40 mg total) by mouth daily. Treating for reflux potentially causing cough- does not have traditional reflux symptoms    Dispense:  30 capsule    Refill:  3    Return precautions advised.  Garnette Katrinka, MD

## 2024-02-08 ENCOUNTER — Ambulatory Visit: Payer: Self-pay | Admitting: Family Medicine

## 2024-02-08 ENCOUNTER — Other Ambulatory Visit (INDEPENDENT_AMBULATORY_CARE_PROVIDER_SITE_OTHER)

## 2024-02-08 DIAGNOSIS — N1832 Chronic kidney disease, stage 3b: Secondary | ICD-10-CM | POA: Diagnosis not present

## 2024-02-08 LAB — MICROALBUMIN / CREATININE URINE RATIO
Creatinine,U: 35.3 mg/dL
Microalb Creat Ratio: UNDETERMINED mg/g (ref 0.0–30.0)
Microalb, Ur: <0.7 mg/dL

## 2024-02-09 ENCOUNTER — Ambulatory Visit: Admitting: Physical Therapy

## 2024-02-09 ENCOUNTER — Encounter: Payer: Self-pay | Admitting: Physical Therapy

## 2024-02-09 DIAGNOSIS — M79605 Pain in left leg: Secondary | ICD-10-CM

## 2024-02-09 DIAGNOSIS — M25572 Pain in left ankle and joints of left foot: Secondary | ICD-10-CM

## 2024-02-09 NOTE — Therapy (Signed)
 OUTPATIENT PHYSICAL THERAPY LOWER EXTREMITY TREATMENT    Patient Name: Lisa Greene MRN: [id number] DOB:1940/08/03, 83 y.o., female Today's Date: 02/09/2024  END OF SESSION:  PT End of Session - 02/09/24 1406     Visit Number 2    Number of Visits 16    Date for PT Re-Evaluation 03/16/24    Authorization Type [employer]    PT Start Time 1304    PT Stop Time 1345    PT Time Calculation (min) 41 min    Activity Tolerance Patient tolerated treatment well    Behavior During Therapy [facility] for tasks assessed/performed           Past Medical History:  Diagnosis Date   ALLERGIC RHINITIS 01/31/2008   Allergy 1960   Asthma 2019   BCC (basal cell carcinoma)sup& nod 03/01/2017   right nostril   Breast cancer of lower-inner quadrant of left female breast (HCC) 11/01/2015   treated with lumpectomy and radiation   Cataract    Colon cancer screening 05/21/2014   No polyps at age 27. Diverticulosis alone-no more colonoscopies.     DIVERTICULITIS, HX OF 09/07/2008   pt denies this    Eosinophilia 02/08/2009   Fibroid    Heart murmur    HYPERLIPIDEMIA 12/20/2006   Hypertension    OSTEOPOROSIS 12/20/2006   PVC's (premature ventricular contractions)    SCC (squamous cell carcinoma) well diff 11/28/2013   left side chin   Vitamin B 12 deficiency    Past Surgical History:  Procedure Laterality Date   BREAST LUMPECTOMY WITH NEEDLE LOCALIZATION Left 01/03/2016   Procedure: BREAST re-excision LUMPECTOMY WITH NEEDLE LOCALIZATION;  Surgeon: Donnice Bury, MD;  Location: [facility];  Service: General;  Laterality: Left;  BREAST re-excision LUMPECTOMY WITH NEEDLE LOCALIZATION   BREAST LUMPECTOMY WITH RADIOACTIVE SEED LOCALIZATION Left 11/28/2015   Procedure: LEFT BREAST LUMPECTOMY WITH BRACKETED RADIOACTIVE SEED LOCALIZATION;  Surgeon: Donnice Bury, MD;  Location: [facility];  Service: General;  Laterality: Left;   BREAST SURGERY  2017   cysts  removed x2   CATARACT EXTRACTION BILATERAL W/ ANTERIOR VITRECTOMY  2013   bilateral cataracts   COLONOSCOPY  2005   tics only    FOOT SURGERY     IR KYPHO LUMBAR INC FX REDUCE BONE BX UNI/BIL CANNULATION INC/IMAGING  05/28/2022   IR RADIOLOGIST EVAL & MGMT  05/21/2022   KNEE ARTHROSCOPY     left   NEUROMA SURGERY     x2 feet   thyroid  duct cyst     Patient Active Problem List   Diagnosis Date Noted   Lumbar facet arthropathy 08/11/2022   Closed compression fracture of L2 lumbar vertebra, with routine healing, subsequent encounter 08/11/2022   HSV-1 (herpes simplex virus 1) infection 04/28/2022   Pain in left ankle and joints of left foot 06/18/2021   Vasomotor rhinitis 05/10/2019   Chronic allergic conjunctivitis 05/10/2019   PVC (premature ventricular contraction) 04/14/2017   Aortic atherosclerosis (HCC) 11/23/2016   Low vitamin B12 level 06/24/2016   History of breast cancer- Breast cancer of lower-inner quadrant of left female breast 11/01/2015   Stage 3b chronic kidney disease (HCC) 10/22/2015   Family history of abdominal aortic aneurysm 03/08/2015   Asthma, moderate persistent, well-controlled 04/19/2014   Hypertension 11/25/2010   Eosinophilia 02/08/2009   Allergic rhinitis 01/31/2008   Hyperlipidemia 12/20/2006   Osteoporosis 12/20/2006     PCP: Garnette Lukes  REFERRING PROVIDER: Odis Mace  REFERRING DIAG: Pain in L  ankle,  Neck pain   THERAPY DIAG:  Pain in left ankle and joints of left foot  Pain in left leg  Rationale for Evaluation and Treatment: Rehabilitation  ONSET DATE:    SUBJECTIVE:   SUBJECTIVE STATEMENT: Pt stats pain in L foot improving, has been able to ride her stationary bike. Still Sore on outside and with mild swelling in lower leg. She is Not wearing compression sock much. Also states neck pain is better, but still feels stiff.    Neck Pain: increased pain for about 2 months, no incident to report. Woke up and couldn't turn  head, thought it would get better. R side still hurts, mostly with with R rotation, feels limited, Also hurts on R with L rotation. Difficulty laying on back due to neck pain. Increased pain In back of head.   L foot and lower leg started swelling about 2-3 months. Sometimes painful- up to 10/10. Can also be fine. Painful daily. Standing is worst pain.    PERTINENT HISTORY: CKD, breast CA, L2 comp fracture, Osteoporosis  PAIN:  Are you having pain? Yes: NPRS scale: 8/10 Pain location: Neck  Pain description: sore, stiff Aggravating factors: movement, rotation Relieving factors: none stated   Are you having pain? Yes: NPRS scale: up to 10/10 Pain location: Foot Left  Pain description: swollen, painful Aggravating factors: Standing, going down steps  Relieving factors: none stated    PRECAUTIONS: None  WEIGHT BEARING RESTRICTIONS: No  FALLS:  Has patient fallen in last 6 months? No   PLOF: Independent  PATIENT GOALS:  Decreased pain in neck and foot   NEXT MD VISIT:   OBJECTIVE:   DIAGNOSTIC FINDINGS:   PATIENT SURVEYS:    COGNITION: Overall cognitive status: Within functional limits for tasks assessed     SENSATION: WFL  EDEMA: Mild in L foot,   mild/mod in L lower leg    POSTURE:    No Significant postural limitations  PALPATION:   LOWER EXTREMITY ROM: Cervical Flexion: WFL,   L rot: 62, R rot: 55 , Ext: mod/sign limitation/ pain Shoulders: WFL/mild limitation for flexion  Ankle:  mild limitation for DF,  other WFL .    LOWER EXTREMITY MMT:  MMT Left eval Right  eval  Shoulder  flexion 4 4   extension    Shoulder   abduction 4 4  adduction    Shouder internal rotation    Shoulder  external rotation    Knee flexion    Knee extension    Ankle dorsiflexion 4   Ankle plantarflexion 4   Ankle inversion 4   Ankle eversion 4    (Blank rows = not tested)  SPECIAL TESTS:   FUNCTIONAL TESTS:    GAIT: Unremarkable    TODAY'S TREATMENT:  DATE:  02/09/24 Therapeutic Exercise: reviewed HEP  Aerobic: Supine: Seated:  ankle PF RTB x 20;   thoracic rotation x 10,  cervical rotation x 10;  R Side bending stretch x 3,  Standing:  tandem stance 20 sec x 5;  gastroc stretch x 3 bil;  Stretches:  Neuromuscular Re-education: Manual Therapy:  prom for rotation, Cervical PA mobs, cervical distraction ; SOR;  Therapeutic Activity:  Self Care:    PATIENT EDUCATION:  Education details: PT POC, Exam findings, HEP for neck and ankle -(has from MD)  Person educated: Patient Education method: Explanation, Demonstration, Tactile cues, Verbal cues, and Handouts Education comprehension: verbalized understanding, returned demonstration, verbal cues required, tactile cues required, and needs further education   HOME EXERCISE PROGRAM: Access Code: [email or web address] URL: [email or web address] Date: 01/26/2024 Prepared by: Tinnie Don  Exercises - Supine Cervical Rotation AROM on Pillow  - 2 x daily - 1 sets - 10 reps - Seated Cervical Flexion AROM  - 2 x daily - 1 sets - 5- 10 reps  ASSESSMENT:  CLINICAL IMPRESSION: Pt with improving pain in foot and neck. She has no pain with foot/ankle exercises today, but does have tenderness with palpation. She does have mild swelling in lower leg, recommended continued/more consistent use of compression sock to improve. Neck still limited with R rotation, but improved from last session. Pt will benefit from continued care.   Eval: Patient presents with primary complaint of  pain in neck and L foot . She has had swelling and pain in L lower leg and foot. Is a bit better this week, but has been ongoing. She has minimal pain to palpate or test today, but overall mild weakness in foot. She has stiffness in neck, with decreased ROM for rotation. Pt with decreased ability for  full functional activities. Pt will  benefit from skilled PT to improve deficits and pain and to return to PLOF.   OBJECTIVE IMPAIRMENTS: decreased activity tolerance, decreased mobility, decreased ROM, decreased strength, increased muscle spasms, impaired flexibility, improper body mechanics, and pain.   ACTIVITY LIMITATIONS: lifting, standing, squatting, stairs, and locomotion level  PARTICIPATION LIMITATIONS: meal prep, cleaning, laundry, driving, shopping, and community activity  PERSONAL FACTORS: Past/current experiences and Time since onset of injury/illness/exacerbation are also affecting patient's functional outcome.   REHAB POTENTIAL: Good  CLINICAL DECISION MAKING: Evolving/moderate complexity  EVALUATION COMPLEXITY: Moderate   GOALS: Goals reviewed with patient? Yes  SHORT TERM GOALS: Target date: 02/10/2024  Pt to be independent with initial HEP  Goal status: INITIAL  2.  Pt to be compliant with compression sock wearing for L le swelling.   Goal status: INITIAL   LONG TERM GOALS: Target date: 03/16/2024   Pt to be independent with final HEP  Goal status: INITIAL  2.  Pt to demo improved neck rotation to be equal on L and R, without pain, to improve ability for driving and IADLs.   Goal status: INITIAL  3.  Pt to report decreased pain in neck and foot by at least 2 points.   Goal status: INITIAL  4.  Pt to demo ability for ambulation and stairs without pain in L foot, to improve ability for community activity.   Goal status: INITIAL    PLAN:  PT FREQUENCY: 1-2x/week  PT DURATION: 8 weeks  PLANNED INTERVENTIONS: Therapeutic exercises, Therapeutic activity, Neuromuscular re-education, Patient/Family education, Self Care, Joint mobilization, Joint manipulation, Stair training, Orthotic/Fit training, DME instructions, Aquatic Therapy, Dry Needling, Electrical stimulation, Cryotherapy, Moist heat, Taping, Ultrasound,  Ionotophoresis 4mg /ml Dexamethasone ,  Manual therapy,  Vasopneumatic device, Traction, Spinal manipulation, Spinal mobilization,Balance training, Gait training,   PLAN FOR NEXT SESSION:    Tinnie Don, PT, DPT 4:51 PM  02/09/24

## 2024-02-11 ENCOUNTER — Ambulatory Visit: Admitting: Physical Therapy

## 2024-02-11 ENCOUNTER — Encounter: Payer: Self-pay | Admitting: Physical Therapy

## 2024-02-11 DIAGNOSIS — M5459 Other low back pain: Secondary | ICD-10-CM | POA: Diagnosis not present

## 2024-02-11 DIAGNOSIS — M25572 Pain in left ankle and joints of left foot: Secondary | ICD-10-CM | POA: Diagnosis not present

## 2024-02-11 DIAGNOSIS — M546 Pain in thoracic spine: Secondary | ICD-10-CM

## 2024-02-11 DIAGNOSIS — M79605 Pain in left leg: Secondary | ICD-10-CM | POA: Diagnosis not present

## 2024-02-11 DIAGNOSIS — M542 Cervicalgia: Secondary | ICD-10-CM | POA: Diagnosis not present

## 2024-02-11 NOTE — Therapy (Unsigned)
 OUTPATIENT PHYSICAL THERAPY LOWER EXTREMITY TREATMENT    Patient Name: Lisa Greene MRN: [id number] DOB:Nov 13, 1940, 83 y.o., female Today's Date: 02/11/2024  END OF SESSION:  PT End of Session - 02/11/24 1058     Visit Number 3    Number of Visits 16    Date for PT Re-Evaluation 03/16/24    Authorization Type [employer]    PT Start Time 1100    PT Stop Time 1140    PT Time Calculation (min) 40 min    Activity Tolerance Patient tolerated treatment well    Behavior During Therapy [facility] for tasks assessed/performed           Past Medical History:  Diagnosis Date   ALLERGIC RHINITIS 01/31/2008   Allergy 1960   Asthma 2019   BCC (basal cell carcinoma)sup& nod 03/01/2017   right nostril   Breast cancer of lower-inner quadrant of left female breast (HCC) 11/01/2015   treated with lumpectomy and radiation   Cataract    Colon cancer screening 05/21/2014   No polyps at age 29. Diverticulosis alone-no more colonoscopies.     DIVERTICULITIS, HX OF 09/07/2008   pt denies this    Eosinophilia 02/08/2009   Fibroid    Heart murmur    HYPERLIPIDEMIA 12/20/2006   Hypertension    OSTEOPOROSIS 12/20/2006   PVC's (premature ventricular contractions)    SCC (squamous cell carcinoma) well diff 11/28/2013   left side chin   Vitamin B 12 deficiency    Past Surgical History:  Procedure Laterality Date   BREAST LUMPECTOMY WITH NEEDLE LOCALIZATION Left 01/03/2016   Procedure: BREAST re-excision LUMPECTOMY WITH NEEDLE LOCALIZATION;  Surgeon: Donnice Bury, MD;  Location: [facility];  Service: General;  Laterality: Left;  BREAST re-excision LUMPECTOMY WITH NEEDLE LOCALIZATION   BREAST LUMPECTOMY WITH RADIOACTIVE SEED LOCALIZATION Left 11/28/2015   Procedure: LEFT BREAST LUMPECTOMY WITH BRACKETED RADIOACTIVE SEED LOCALIZATION;  Surgeon: Donnice Bury, MD;  Location: [facility];  Service: General;  Laterality: Left;   BREAST SURGERY  2017   cysts  removed x2   CATARACT EXTRACTION BILATERAL W/ ANTERIOR VITRECTOMY  2013   bilateral cataracts   COLONOSCOPY  2005   tics only    FOOT SURGERY     IR KYPHO LUMBAR INC FX REDUCE BONE BX UNI/BIL CANNULATION INC/IMAGING  05/28/2022   IR RADIOLOGIST EVAL & MGMT  05/21/2022   KNEE ARTHROSCOPY     left   NEUROMA SURGERY     x2 feet   thyroid  duct cyst     Patient Active Problem List   Diagnosis Date Noted   Lumbar facet arthropathy 08/11/2022   Closed compression fracture of L2 lumbar vertebra, with routine healing, subsequent encounter 08/11/2022   HSV-1 (herpes simplex virus 1) infection 04/28/2022   Pain in left ankle and joints of left foot 06/18/2021   Vasomotor rhinitis 05/10/2019   Chronic allergic conjunctivitis 05/10/2019   PVC (premature ventricular contraction) 04/14/2017   Aortic atherosclerosis (HCC) 11/23/2016   Low vitamin B12 level 06/24/2016   History of breast cancer- Breast cancer of lower-inner quadrant of left female breast 11/01/2015   Stage 3b chronic kidney disease (HCC) 10/22/2015   Family history of abdominal aortic aneurysm 03/08/2015   Asthma, moderate persistent, well-controlled 04/19/2014   Hypertension 11/25/2010   Eosinophilia 02/08/2009   Allergic rhinitis 01/31/2008   Hyperlipidemia 12/20/2006   Osteoporosis 12/20/2006     PCP: Garnette Lukes  REFERRING PROVIDER: Odis Mace  REFERRING DIAG: Pain in L  ankle,  Neck pain   THERAPY DIAG:  Pain in left ankle and joints of left foot  Pain in left leg  Cervicalgia  Other low back pain  Pain in thoracic spine  Rationale for Evaluation and Treatment: Rehabilitation  ONSET DATE:    SUBJECTIVE:   SUBJECTIVE STATEMENT:  Pt with no new complaints. Feels foot is doing very well. Neck still stiff/sore, but better.    Neck Pain: increased pain for about 2 months, no incident to report. Woke up and couldn't turn head, thought it would get better. R side still hurts, mostly with with R  rotation, feels limited, Also hurts on R with L rotation. Difficulty laying on back due to neck pain. Increased pain In back of head.   L foot and lower leg started swelling about 2-3 months. Sometimes painful- up to 10/10. Can also be fine. Painful daily. Standing is worst pain.    PERTINENT HISTORY: CKD, breast CA, L2 comp fracture, Osteoporosis  PAIN:  Are you having pain? Yes: NPRS scale: 8/10 Pain location: Neck  Pain description: sore, stiff Aggravating factors: movement, rotation Relieving factors: none stated   Are you having pain? Yes: NPRS scale: up to 10/10 Pain location: Foot Left  Pain description: swollen, painful Aggravating factors: Standing, going down steps  Relieving factors: none stated    PRECAUTIONS: None  WEIGHT BEARING RESTRICTIONS: No  FALLS:  Has patient fallen in last 6 months? No   PLOF: Independent  PATIENT GOALS:  Decreased pain in neck and foot   NEXT MD VISIT:   OBJECTIVE:   DIAGNOSTIC FINDINGS:   PATIENT SURVEYS:    COGNITION: Overall cognitive status: Within functional limits for tasks assessed     SENSATION: WFL  EDEMA: Mild in L foot,   mild/mod in L lower leg    POSTURE:    No Significant postural limitations  PALPATION:   LOWER EXTREMITY ROM: Cervical Flexion: WFL,   L rot: 62, R rot: 55 , Ext: mod/sign limitation/ pain Shoulders: WFL/mild limitation for flexion  Ankle:  mild limitation for DF,  other WFL .    LOWER EXTREMITY MMT:  MMT Left eval Right  eval  Shoulder  flexion 4 4   extension    Shoulder   abduction 4 4  adduction    Shouder internal rotation    Shoulder  external rotation    Knee flexion    Knee extension    Ankle dorsiflexion 4   Ankle plantarflexion 4   Ankle inversion 4   Ankle eversion 4    (Blank rows = not tested)  SPECIAL TESTS:   FUNCTIONAL TESTS:    GAIT: Unremarkable    TODAY'S TREATMENT:                                                                                                                               DATE:  02/11/24 Therapeutic Exercise: reviewed HEP  Aerobic: Supine: Seated:  heel raises x 20 ;   ankle PF RTB x 20;  thoracic rotation x 10,  cervical rotation x 10;   R Side bending stretch x 3,  Standing:  tandem stance 20 sec x 5;  gastroc stretch x 3 bil;   Marching x 25;  Stretches:  Neuromuscular Re-education: Manual Therapy:  prom for rotation, Cervical PA mobs, cervical distraction ; manual UT stretching, Light distraction,  Therapeutic Activity:  Self Care:    PATIENT EDUCATION:  Education details: updated and reviewed HEP Person educated: Patient Education method: Explanation, Demonstration, Tactile cues, Verbal cues, and Handouts Education comprehension: verbalized understanding, returned demonstration, verbal cues required, tactile cues required, and needs further education   HOME EXERCISE PROGRAM: Access Code: [email or web address] URL: [email or web address] Date: 01/26/2024 Prepared by: Tinnie Don  Exercises - Supine Cervical Rotation AROM on Pillow  - 2 x daily - 1 sets - 10 reps - Seated Cervical Flexion AROM  - 2 x daily - 1 sets - 5- 10 reps  ASSESSMENT:  CLINICAL IMPRESSION:  Pt with improving pain in foot and neck. She has no pain with foot/ankle exercises today, but does have tenderness with palpation. Neck still mildly limited with R rotation, but overall improving. Will benefit from continued manual and focus on neck stiffness and pain.   Eval: Patient presents with primary complaint of  pain in neck and L foot . She has had swelling and pain in L lower leg and foot. Is a bit better this week, but has been ongoing. She has minimal pain to palpate or test today, but overall mild weakness in foot. She has stiffness in neck, with decreased ROM for rotation. Pt with decreased ability for full functional activities. Pt will  benefit from skilled PT to improve deficits and pain and to return to PLOF.    OBJECTIVE IMPAIRMENTS: decreased activity tolerance, decreased mobility, decreased ROM, decreased strength, increased muscle spasms, impaired flexibility, improper body mechanics, and pain.   ACTIVITY LIMITATIONS: lifting, standing, squatting, stairs, and locomotion level  PARTICIPATION LIMITATIONS: meal prep, cleaning, laundry, driving, shopping, and community activity  PERSONAL FACTORS: Past/current experiences and Time since onset of injury/illness/exacerbation are also affecting patient's functional outcome.   REHAB POTENTIAL: Good  CLINICAL DECISION MAKING: Evolving/moderate complexity  EVALUATION COMPLEXITY: Moderate   GOALS: Goals reviewed with patient? Yes  SHORT TERM GOALS: Target date: 02/10/2024  Pt to be independent with initial HEP  Goal status: INITIAL  2.  Pt to be compliant with compression sock wearing for L le swelling.   Goal status: INITIAL   LONG TERM GOALS: Target date: 03/16/2024   Pt to be independent with final HEP  Goal status: INITIAL  2.  Pt to demo improved neck rotation to be equal on L and R, without pain, to improve ability for driving and IADLs.   Goal status: INITIAL  3.  Pt to report decreased pain in neck and foot by at least 2 points.   Goal status: INITIAL  4.  Pt to demo ability for ambulation and stairs without pain in L foot, to improve ability for community activity.   Goal status: INITIAL    PLAN:  PT FREQUENCY: 1-2x/week  PT DURATION: 8 weeks  PLANNED INTERVENTIONS: Therapeutic exercises, Therapeutic activity, Neuromuscular re-education, Patient/Family education, Self Care, Joint mobilization, Joint manipulation, Stair training, Orthotic/Fit training, DME instructions, Aquatic Therapy, Dry Needling, Electrical stimulation, Cryotherapy, Moist heat, Taping, Ultrasound, Ionotophoresis 4mg /ml Dexamethasone , Manual therapy,  Vasopneumatic device, Traction, Spinal manipulation, Spinal mobilization,Balance training, Gait  training,   PLAN FOR NEXT SESSION:    Tinnie Don, PT, DPT 10:59 AM  02/11/24

## 2024-02-14 ENCOUNTER — Ambulatory Visit: Admitting: Physical Therapy

## 2024-02-14 ENCOUNTER — Other Ambulatory Visit (HOSPITAL_COMMUNITY): Payer: Self-pay

## 2024-02-14 ENCOUNTER — Telehealth: Payer: Self-pay

## 2024-02-14 ENCOUNTER — Encounter: Payer: Self-pay | Admitting: Physical Therapy

## 2024-02-14 DIAGNOSIS — M79605 Pain in left leg: Secondary | ICD-10-CM

## 2024-02-14 DIAGNOSIS — M25572 Pain in left ankle and joints of left foot: Secondary | ICD-10-CM | POA: Diagnosis not present

## 2024-02-14 DIAGNOSIS — M542 Cervicalgia: Secondary | ICD-10-CM

## 2024-02-14 NOTE — Telephone Encounter (Signed)
 SABRA

## 2024-02-14 NOTE — Therapy (Signed)
 OUTPATIENT PHYSICAL THERAPY LOWER EXTREMITY TREATMENT    Patient Name: OTTILIA PIPPENGER MRN: [id number] DOB:Oct 24, 1940, 83 y.o., female Today's Date: 02/14/2024  END OF SESSION:  PT End of Session - 02/14/24 1141     Visit Number 4    Number of Visits 16    Date for PT Re-Evaluation 03/16/24    Authorization Type [employer] Medicare    PT Start Time 1103    PT Stop Time 1142    PT Time Calculation (min) 39 min    Activity Tolerance Patient tolerated treatment well    Behavior During Therapy [facility] for tasks assessed/performed            Past Medical History:  Diagnosis Date   ALLERGIC RHINITIS 01/31/2008   Allergy 1960   Asthma 2019   BCC (basal cell carcinoma)sup& nod 03/01/2017   right nostril   Breast cancer of lower-inner quadrant of left female breast (HCC) 11/01/2015   treated with lumpectomy and radiation   Cataract    Colon cancer screening 05/21/2014   No polyps at age 73. Diverticulosis alone-no more colonoscopies.     DIVERTICULITIS, HX OF 09/07/2008   pt denies this    Eosinophilia 02/08/2009   Fibroid    Heart murmur    HYPERLIPIDEMIA 12/20/2006   Hypertension    OSTEOPOROSIS 12/20/2006   PVC's (premature ventricular contractions)    SCC (squamous cell carcinoma) well diff 11/28/2013   left side chin   Vitamin B 12 deficiency    Past Surgical History:  Procedure Laterality Date   BREAST LUMPECTOMY WITH NEEDLE LOCALIZATION Left 01/03/2016   Procedure: BREAST re-excision LUMPECTOMY WITH NEEDLE LOCALIZATION;  Surgeon: Donnice Bury, MD;  Location: [facility];  Service: General;  Laterality: Left;  BREAST re-excision LUMPECTOMY WITH NEEDLE LOCALIZATION   BREAST LUMPECTOMY WITH RADIOACTIVE SEED LOCALIZATION Left 11/28/2015   Procedure: LEFT BREAST LUMPECTOMY WITH BRACKETED RADIOACTIVE SEED LOCALIZATION;  Surgeon: Donnice Bury, MD;  [facility];  Service: General;  Laterality: Left;   BREAST SURGERY  2017   cysts  removed x2   CATARACT EXTRACTION BILATERAL W/ ANTERIOR VITRECTOMY  2013   bilateral cataracts   COLONOSCOPY  2005   tics only    FOOT SURGERY     IR KYPHO LUMBAR INC FX REDUCE BONE BX UNI/BIL CANNULATION INC/IMAGING  05/28/2022   IR RADIOLOGIST EVAL & MGMT  05/21/2022   KNEE ARTHROSCOPY     left   NEUROMA SURGERY     x2 feet   thyroid  duct cyst     Patient Active Problem List   Diagnosis Date Noted   Lumbar facet arthropathy 08/11/2022   Closed compression fracture of L2 lumbar vertebra, with routine healing, subsequent encounter 08/11/2022   HSV-1 (herpes simplex virus 1) infection 04/28/2022   Pain in left ankle and joints of left foot 06/18/2021   Vasomotor rhinitis 05/10/2019   Chronic allergic conjunctivitis 05/10/2019   PVC (premature ventricular contraction) 04/14/2017   Aortic atherosclerosis (HCC) 11/23/2016   Low vitamin B12 level 06/24/2016   History of breast cancer- Breast cancer of lower-inner quadrant of left female breast 11/01/2015   Stage 3b chronic kidney disease (HCC) 10/22/2015   Family history of abdominal aortic aneurysm 03/08/2015   Asthma, moderate persistent, well-controlled 04/19/2014   Hypertension 11/25/2010   Eosinophilia 02/08/2009   Allergic rhinitis 01/31/2008   Hyperlipidemia 12/20/2006   Osteoporosis 12/20/2006     PCP: Garnette Lukes  REFERRING PROVIDER: Odis Mace  REFERRING DIAG: Pain in  L ankle,  Neck pain   THERAPY DIAG:  Pain in left ankle and joints of left foot  Pain in left leg  Cervicalgia  Rationale for Evaluation and Treatment: Rehabilitation  ONSET DATE:    SUBJECTIVE:   SUBJECTIVE STATEMENT:  Pt with no new complaints. Feels foot is doing well, still swelling a bit. Was swollen this am, even first thing. She has been wearing compression sock.  Neck still stiff/sore, but better.    Neck Pain: increased pain for about 2 months, no incident to report. Woke up and couldn't turn head, thought it would get  better. R side still hurts, mostly with with R rotation, feels limited, Also hurts on R with L rotation. Difficulty laying on back due to neck pain. Increased pain In back of head.   L foot and lower leg started swelling about 2-3 months. Sometimes painful- up to 10/10. Can also be fine. Painful daily. Standing is worst pain.    PERTINENT HISTORY: CKD, breast CA, L2 comp fracture, Osteoporosis  PAIN:  Are you having pain? Yes: NPRS scale: 8/10 Pain location: Neck  Pain description: sore, stiff Aggravating factors: movement, rotation Relieving factors: none stated   Are you having pain? Yes: NPRS scale: up to 10/10 Pain location: Foot Left  Pain description: swollen, painful Aggravating factors: Standing, going down steps  Relieving factors: none stated    PRECAUTIONS: None  WEIGHT BEARING RESTRICTIONS: No  FALLS:  Has patient fallen in last 6 months? No   PLOF: Independent  PATIENT GOALS:  Decreased pain in neck and foot   NEXT MD VISIT:   OBJECTIVE:   DIAGNOSTIC FINDINGS:   PATIENT SURVEYS:    COGNITION: Overall cognitive status: Within functional limits for tasks assessed     SENSATION: WFL  EDEMA: Mild in L foot,   mild/mod in L lower leg    POSTURE:    No Significant postural limitations  PALPATION:   LOWER EXTREMITY ROM: Cervical Flexion: WFL,   L rot: 62, R rot: 55 , Ext: mod/sign limitation/ pain Shoulders: WFL/mild limitation for flexion  Ankle:  mild limitation for DF,  other WFL .    LOWER EXTREMITY MMT:  MMT Left eval Right  eval  Shoulder  flexion 4 4   extension    Shoulder   abduction 4 4  adduction    Shouder internal rotation    Shoulder  external rotation    Knee flexion    Knee extension    Ankle dorsiflexion 4   Ankle plantarflexion 4   Ankle inversion 4   Ankle eversion 4    (Blank rows = not tested)  SPECIAL TESTS:   FUNCTIONAL TESTS:   GAIT: Unremarkable   TODAY'S TREATMENT:                                                                                                                               DATE:  02/14/24 Therapeutic Exercise:  reviewed HEP  Aerobic: Supine: Seated:  thoracic rotation x 10,  cervical rotation x 10;  Scap squeeze x 15;  R UT and levator stretch x 3,  Standing:  tandem stance 20 sec x 5;   Stretches:  Neuromuscular Re-education: Manual Therapy:  prom for rotation, Cervical PA mobs, cervical distraction ; manual UT stretching, Light distraction, STM/TPR to R UT and levator (in sitting)  Therapeutic Activity:  Self Care:    Therapeutic Exercise: reviewed HEP  Aerobic: Supine: Seated:  heel raises x 20 ;   ankle PF RTB x 20;  thoracic rotation x 10,  cervical rotation x 10;   R Side bending stretch x 3,  Standing:  tandem stance 20 sec x 5;  gastroc stretch x 3 bil;   Marching x 25;  Stretches:  Neuromuscular Re-education: Manual Therapy:  prom for rotation, Cervical PA mobs, cervical distraction ; manual UT stretching, Light distraction,  Therapeutic Activity:  Self Care:    PATIENT EDUCATION:  Education details: updated and reviewed HEP Person educated: Patient Education method: Explanation, Demonstration, Tactile cues, Verbal cues, and Handouts Education comprehension: verbalized understanding, returned demonstration, verbal cues required, tactile cues required, and needs further education   HOME EXERCISE PROGRAM: Access Code: [email or web address] URL: [email or web address] Date: 01/26/2024 Prepared by: Tinnie Don  Exercises - Supine Cervical Rotation AROM on Pillow  - 2 x daily - 1 sets - 10 reps - Seated Cervical Flexion AROM  - 2 x daily - 1 sets - 5- 10 reps  ASSESSMENT:  CLINICAL IMPRESSION: Pt with improving pain in foot and neck. Much improved pain by end of session today, In R musculature. Will benefit from continued care.   Eval: Patient presents with primary complaint of  pain in neck and L foot . She has had swelling and pain  in L lower leg and foot. Is a bit better this week, but has been ongoing. She has minimal pain to palpate or test today, but overall mild weakness in foot. She has stiffness in neck, with decreased ROM for rotation. Pt with decreased ability for full functional activities. Pt will  benefit from skilled PT to improve deficits and pain and to return to PLOF.   OBJECTIVE IMPAIRMENTS: decreased activity tolerance, decreased mobility, decreased ROM, decreased strength, increased muscle spasms, impaired flexibility, improper body mechanics, and pain.   ACTIVITY LIMITATIONS: lifting, standing, squatting, stairs, and locomotion level  PARTICIPATION LIMITATIONS: meal prep, cleaning, laundry, driving, shopping, and community activity  PERSONAL FACTORS: Past/current experiences and Time since onset of injury/illness/exacerbation are also affecting patient's functional outcome.   REHAB POTENTIAL: Good  CLINICAL DECISION MAKING: Evolving/moderate complexity  EVALUATION COMPLEXITY: Moderate   GOALS: Goals reviewed with patient? Yes  SHORT TERM GOALS: Target date: 02/10/2024  Pt to be independent with initial HEP  Goal status: INITIAL  2.  Pt to be compliant with compression sock wearing for L le swelling.   Goal status: INITIAL   LONG TERM GOALS: Target date: 03/16/2024   Pt to be independent with final HEP  Goal status: INITIAL  2.  Pt to demo improved neck rotation to be equal on L and R, without pain, to improve ability for driving and IADLs.   Goal status: INITIAL  3.  Pt to report decreased pain in neck and foot by at least 2 points.   Goal status: INITIAL  4.  Pt to demo ability for ambulation and stairs without pain in L foot, to improve ability for community activity.  Goal status: INITIAL    PLAN:  PT FREQUENCY: 1-2x/week  PT DURATION: 8 weeks  PLANNED INTERVENTIONS: Therapeutic exercises, Therapeutic activity, Neuromuscular re-education, Patient/Family education,  Self Care, Joint mobilization, Joint manipulation, Stair training, Orthotic/Fit training, DME instructions, Aquatic Therapy, Dry Needling, Electrical stimulation, Cryotherapy, Moist heat, Taping, Ultrasound, Ionotophoresis 4mg /ml Dexamethasone , Manual therapy,  Vasopneumatic device, Traction, Spinal manipulation, Spinal mobilization,Balance training, Gait training,   PLAN FOR NEXT SESSION:    Tinnie Don, PT, DPT 12:39 PM  02/14/24

## 2024-02-14 NOTE — Telephone Encounter (Signed)
 Prolia  VOB initiated via [email or web address]  Next Prolia  inj DUE: 03/15/24

## 2024-02-14 NOTE — Telephone Encounter (Signed)
 Buy and [employer] PA submitted via [employer]. Authorization Number : [id number]   PHARMACY: [id number]

## 2024-02-15 MED ORDER — PREDNISONE 20 MG PO TABS: ORAL_TABLET | ORAL | 0 refills | Status: DC

## 2024-02-15 NOTE — Telephone Encounter (Signed)
 Pt ready for scheduling for PROLIA  on or after : 03/15/24  Option# 1: Buy/Bill (Office supplied medication)  Out-of-pocket cost due at time of clinic visit: $357  Number of injection/visits approved: 2  Primary: [employer] Prolia  co-insurance: 20% Admin fee co-insurance: 20%  Secondary: --- Prolia  co-insurance:  Admin fee co-insurance:   Medical Benefit Details: Date Benefits were checked: 02/14/24 Deductible: NO/ Coinsurance: 20%/ Admin Fee: 20%  Prior Auth: APPROVED PA# [id number]  Expiration Date: 02/14/24-02/13/25  # of doses approved: 2 ----------------------------------------------------------------------- Option# 2- Med Obtained from pharmacy:  Pharmacy benefit: Copay $520.04 (Paid to pharmacy) Admin Fee: 20% (Pay at clinic)  Prior Auth: N/A PA# Expiration Date:   # of doses approved:   If patient wants fill through the pharmacy benefit please send prescription to: [facility], and include estimated need by date in rx notes. Pharmacy will ship medication directly to the office.  Patient NOT eligible for Prolia  Copay Card. Copay Card can make patient's cost as little as $25. Link to apply: [email or web address]  ** This summary of benefits is an estimation of the patient's out-of-pocket cost. Exact cost may very based on individual plan coverage.

## 2024-02-15 NOTE — Progress Notes (Unsigned)
 Ben Jackson D.[name] [address] [place] [address] Phone: [phone]   Assessment and Plan:     1. Chronic pain of left ankle (Primary) 2. Bilateral lower extremity edema -Chronic with exacerbation, subsequent visit - Continued left lower extremity swelling with patient experiencing mild right lower extremity swelling.  Overall decrease pain with physical therapy, allopurinol  50 mg daily. - Patient had mildly elevated uric acid level at 7.5 on 07/25/2023 with past medical history of CKD and patient taking HCTZ.  Uric acid level decreased to 6.6 on 01/19/2024 after starting allopurinol  50 mg daily.  Patient discontinued HCTZ 1 month ago, so we will recheck uric acid level today.  In patients with history of gout flare, our goal is uric acid level <6.0.  With past medical history of CKD, we will use lowest effective dose of allopurinol .  Continue allopurinol  50 mg daily. -Patient's continued lower extremity swelling is not consistent with gout at this time.  Suspect alternative condition such as peripheral vascular disease.  Echo was unremarkable on 09/28/2023 with EF preserved, negative venous ultrasound for DVT on 12/14/2023.  Recommend evaluation with vascular specialist.  Referral placed.  Continue elevation, OTC compression stockings - May continue HEP and physical therapy as tolerated  Pertinent previous records reviewed include echo 09/28/2023, cardiology telephone encounter 01/20/2024, family medicine note 02/07/2024, physical therapy note 02/14/2024   Follow Up: As needed.   Subjective:   I, Lisa Greene, am serving as a Neurosurgeon for Doctor Morene Mace  Chief Complaint: left ankle pain   HPI:  12/22/23 Patient saw Dr. Joane for chronic pain in the left ankle.    01/19/24 Patient states that the left ankle is better. It still swells and the leg still swells. It still hurts as well. Patient states she does elevate it throughout the  day when she is not doing anything. It is not often because she stays busy.  02/16/2024 Patient states a little better but still swelling   Relevant Historical Information: CKD, hypertension,  Additional pertinent review of systems negative.   Current Outpatient Medications:    Albuterol  Sulfate (PROAIR  RESPICLICK) 108 (90 Base) MCG/ACT AEPB, Inhale 1 puff into the lungs every 8 (eight) hours as needed (wheezing/shortness of breath)., Disp: , Rfl:    allopurinol  (ZYLOPRIM ) 100 MG tablet, Take 0.5 tablets (50 mg total) by mouth daily., Disp: 30 tablet, Rfl: 3   atorvastatin  (LIPITOR) 20 MG tablet, Take 1 tablet (20 mg total) by mouth daily., Disp: 90 tablet, Rfl: 3   azelastine (OPTIVAR) 0.05 % ophthalmic solution, , Disp: , Rfl:    BREZTRI AEROSPHERE 160-9-4.8 MCG/ACT AERO inhaler, Inhale into the lungs., Disp: , Rfl:    cetirizine (ZYRTEC) 10 MG tablet, Take 10 mg by mouth daily., Disp: , Rfl:    cholecalciferol (VITAMIN D ) 1000 UNITS tablet, Take 2,000 Units by mouth daily., Disp: , Rfl:    Cyanocobalamin  (VITAMIN B 12 PO), Take 1,000 mg by mouth every other day., Disp: , Rfl:    denosumab  (PROLIA ) 60 MG/ML SOSY injection, INJECT 60 MG INTO THE SKIN EVERY 6 (SIX) MONTHS., Disp: 1 mL, Rfl: 1   fluticasone  (FLONASE ) 50 MCG/ACT nasal spray, Place 2 sprays into both nostrils daily., Disp: 48 g, Rfl: 3   irbesartan  (AVAPRO ) 150 MG tablet, TAKE 1 TABLET BY MOUTH DAILY, Disp: 90 tablet, Rfl: 3   metoprolol  succinate (TOPROL -XL) 50 MG 24 hr tablet, TAKE 1 TABLET BY MOUTH DAILY, Disp: 90 tablet, Rfl: 3  montelukast (SINGULAIR) 10 MG tablet, Take 1 tablet by mouth at bedtime., Disp: , Rfl:    omeprazole  (PRILOSEC) 40 MG capsule, Take 1 capsule (40 mg total) by mouth daily. Treating for reflux potentially causing cough- does not have traditional reflux symptoms, Disp: 30 capsule, Rfl: 3   predniSONE  (DELTASONE ) 20 MG tablet, Take 2 pills for 3 days, 1 pill for 4 days, Disp: 10 tablet, Rfl:  0  Current Facility-Administered Medications:    denosumab  (PROLIA ) injection 60 mg, 60 mg, Subcutaneous, Q6 months, Wendolyn Jenkins Jansky, MD, 60 mg at 09/14/23 0912   [START ON 03/15/2024] denosumab  (PROLIA ) injection 60 mg, 60 mg, Subcutaneous, Once, Wendolyn Jenkins Jansky, MD   NOREEN ON 03/15/2024] denosumab  (PROLIA ) injection 60 mg, 60 mg, Subcutaneous, Once, Wendolyn Jenkins Jansky, MD   Objective:     Vitals:   02/16/24 1341  Pulse: 81  SpO2: 98%  Weight: 154 lb (69.9 kg)  Height: 5' 1 (1.549 m)      Body mass index is 29.1 kg/m.    Physical Exam:    Gen: Appears well, nad, nontoxic and pleasant Psych: Alert and oriented, appropriate mood and affect Neuro: sensation intact, strength is 5/5 with df/pf/inv/ev, muscle tone wnl Skin: no susupicious lesions or rashes   Left foot/ankle:  No deformity, erythema, warmth.  Capillary refill intact 2+ pitting edema from mid shin and distal with 1+ pitting edema of right lower extremity from mid shin and distal Generalized TTP with foot and ankle edema with areas of maximal tenderness along lateral malleolus, ATFL ROM DF 20, PF 25, inv/ev intact but reduced    Electronically signed by:  Odis Mace [name] 2:15 PM 02/16/24

## 2024-02-16 ENCOUNTER — Ambulatory Visit: Admitting: Sports Medicine

## 2024-02-16 VITALS — HR 81 | Ht 61.0 in | Wt 154.0 lb

## 2024-02-16 DIAGNOSIS — R600 Localized edema: Secondary | ICD-10-CM

## 2024-02-16 DIAGNOSIS — M25572 Pain in left ankle and joints of left foot: Secondary | ICD-10-CM

## 2024-02-16 DIAGNOSIS — G8929 Other chronic pain: Secondary | ICD-10-CM

## 2024-02-16 LAB — URIC ACID: Uric Acid, Serum: 5.8 mg/dL (ref 2.4–7.0)

## 2024-02-16 NOTE — Patient Instructions (Addendum)
 Vascular referral   Labs on the way out   Continue elevation and compression stockings  As needed follow up

## 2024-02-17 ENCOUNTER — Encounter: Admitting: Physical Therapy

## 2024-02-17 ENCOUNTER — Ambulatory Visit: Payer: Self-pay | Admitting: Sports Medicine

## 2024-02-21 ENCOUNTER — Ambulatory Visit: Admitting: Physical Therapy

## 2024-02-21 DIAGNOSIS — M79605 Pain in left leg: Secondary | ICD-10-CM | POA: Diagnosis not present

## 2024-02-21 DIAGNOSIS — M25572 Pain in left ankle and joints of left foot: Secondary | ICD-10-CM

## 2024-02-21 DIAGNOSIS — M542 Cervicalgia: Secondary | ICD-10-CM | POA: Diagnosis not present

## 2024-02-21 NOTE — Therapy (Unsigned)
 OUTPATIENT PHYSICAL THERAPY LOWER EXTREMITY TREATMENT    Patient Name: Lisa Greene MRN: [id number] DOB:17-Feb-1941, 83 y.o., female Today's Date: 02/21/2024  END OF SESSION:  PT End of Session - 02/21/24 1520     Visit Number 5    Number of Visits 16    Date for Recertification  03/16/24    Authorization Type [employer]    PT Start Time 1520    PT Stop Time 1600    PT Time Calculation (min) 40 min    Activity Tolerance Patient tolerated treatment well    Behavior During Therapy [facility] for tasks assessed/performed            Past Medical History:  Diagnosis Date   ALLERGIC RHINITIS 01/31/2008   Allergy 1960   Asthma 2019   BCC (basal cell carcinoma)sup& nod 03/01/2017   right nostril   Breast cancer of lower-inner quadrant of left female breast (HCC) 11/01/2015   treated with lumpectomy and radiation   Cataract    Colon cancer screening 05/21/2014   No polyps at age 26. Diverticulosis alone-no more colonoscopies.     DIVERTICULITIS, HX OF 09/07/2008   pt denies this    Eosinophilia 02/08/2009   Fibroid    Heart murmur    HYPERLIPIDEMIA 12/20/2006   Hypertension    OSTEOPOROSIS 12/20/2006   PVC's (premature ventricular contractions)    SCC (squamous cell carcinoma) well diff 11/28/2013   left side chin   Vitamin B 12 deficiency    Past Surgical History:  Procedure Laterality Date   BREAST LUMPECTOMY WITH NEEDLE LOCALIZATION Left 01/03/2016   Procedure: BREAST re-excision LUMPECTOMY WITH NEEDLE LOCALIZATION;  Surgeon: Donnice Bury, MD;  Location: [facility];  Service: General;  Laterality: Left;  BREAST re-excision LUMPECTOMY WITH NEEDLE LOCALIZATION   BREAST LUMPECTOMY WITH RADIOACTIVE SEED LOCALIZATION Left 11/28/2015   Procedure: LEFT BREAST LUMPECTOMY WITH BRACKETED RADIOACTIVE SEED LOCALIZATION;  Surgeon: Donnice Bury, MD;  Location: [facility];  Service: General;  Laterality: Left;   BREAST SURGERY  2017   cysts  removed x2   CATARACT EXTRACTION BILATERAL W/ ANTERIOR VITRECTOMY  2013   bilateral cataracts   COLONOSCOPY  2005   tics only    FOOT SURGERY     IR KYPHO LUMBAR INC FX REDUCE BONE BX UNI/BIL CANNULATION INC/IMAGING  05/28/2022   IR RADIOLOGIST EVAL & MGMT  05/21/2022   KNEE ARTHROSCOPY     left   NEUROMA SURGERY     x2 feet   thyroid  duct cyst     Patient Active Problem List   Diagnosis Date Noted   Lumbar facet arthropathy 08/11/2022   Closed compression fracture of L2 lumbar vertebra, with routine healing, subsequent encounter 08/11/2022   HSV-1 (herpes simplex virus 1) infection 04/28/2022   Pain in left ankle and joints of left foot 06/18/2021   Vasomotor rhinitis 05/10/2019   Chronic allergic conjunctivitis 05/10/2019   PVC (premature ventricular contraction) 04/14/2017   Aortic atherosclerosis 11/23/2016   Low vitamin B12 level 06/24/2016   History of breast cancer- Breast cancer of lower-inner quadrant of left female breast 11/01/2015   Stage 3b chronic kidney disease (HCC) 10/22/2015   Family history of abdominal aortic aneurysm 03/08/2015   Asthma, moderate persistent, well-controlled 04/19/2014   Hypertension 11/25/2010   Eosinophilia 02/08/2009   Allergic rhinitis 01/31/2008   Hyperlipidemia 12/20/2006   Osteoporosis 12/20/2006     PCP: Garnette Lukes  REFERRING PROVIDER: Odis Mace  REFERRING DIAG: Pain in L  ankle,  Neck pain   THERAPY DIAG:  Pain in left ankle and joints of left foot  Pain in left leg  Cervicalgia  Rationale for Evaluation and Treatment: Rehabilitation  ONSET DATE:    SUBJECTIVE:   SUBJECTIVE STATEMENT: Pt with no new complaints. Feels foot is doing well, much less swollen this week, has been on prednisone .  Neck also doing much better, more movement and less pain.    Neck Pain: increased pain for about 2 months, no incident to report. Woke up and couldn't turn head, thought it would get better. R side still hurts, mostly  with with R rotation, feels limited, Also hurts on R with L rotation. Difficulty laying on back due to neck pain. Increased pain In back of head.   L foot and lower leg started swelling about 2-3 months. Sometimes painful- up to 10/10. Can also be fine. Painful daily. Standing is worst pain.    PERTINENT HISTORY: CKD, breast CA, L2 comp fracture, Osteoporosis  PAIN:  Are you having pain? Yes: NPRS scale: 3-4/10 Pain location: Neck  Pain description: sore, stiff Aggravating factors: movement, rotation Relieving factors: none stated   Are you having pain? Yes: NPRS scale: up to 4/10 Pain location: Foot Left  Pain description: swollen, painful Aggravating factors: Standing, going down steps  Relieving factors: none stated    PRECAUTIONS: None  WEIGHT BEARING RESTRICTIONS: No  FALLS:  Has patient fallen in last 6 months? No   PLOF: Independent  PATIENT GOALS:  Decreased pain in neck and foot   NEXT MD VISIT:   OBJECTIVE:   DIAGNOSTIC FINDINGS:   PATIENT SURVEYS:    COGNITION: Overall cognitive status: Within functional limits for tasks assessed     SENSATION: WFL  EDEMA: Mild in L foot,   mild/mod in L lower leg    POSTURE:    No Significant postural limitations  PALPATION:   LOWER EXTREMITY ROM: Cervical Flexion: WFL,   L rot: 70, R rot: 65 , Ext: mod/sign limitation/ pain Shoulders: WFL/mild limitation for flexion  Ankle:  mild limitation for DF,  other WFL .    LOWER EXTREMITY MMT:  MMT Left eval Right  eval  Shoulder  flexion 4 4   extension    Shoulder   abduction 4 4  adduction    Shouder internal rotation    Shoulder  external rotation    Knee flexion    Knee extension    Ankle dorsiflexion 4   Ankle plantarflexion 4   Ankle inversion 4   Ankle eversion 4    (Blank rows = not tested)  SPECIAL TESTS:   FUNCTIONAL TESTS:   GAIT: Unremarkable   TODAY'S TREATMENT:                                                                                                                               DATE:  02/21/24 Therapeutic Exercise:  reviewed HEP  Aerobic: Supine:  Seated:  thoracic rotation x 10,  cervical rotation x 10;   R UT and levator stretch x 3,  Standing:    shoulder ER x 15; shoulder flexion x 12; Rows GTB 2 x 10;  Stretches:  Neuromuscular Re-education: Manual Therapy:  prom for rotation, Cervical PA mobs, cervical distraction ; manual UT stretching, Light distraction, STM/TPR to R UT and levator (in sitting)  Therapeutic Activity:  Self Care:    Therapeutic Exercise: reviewed HEP  Aerobic: Supine: Seated:  heel raises x 20 ;   ankle PF RTB x 20;  thoracic rotation x 10,  cervical rotation x 10;   R Side bending stretch x 3,  Standing:  tandem stance 20 sec x 5;  gastroc stretch x 3 bil;   Marching x 25;  Stretches:  Neuromuscular Re-education: Manual Therapy:  prom for rotation, Cervical PA mobs, cervical distraction ; manual UT stretching, Light distraction,  Therapeutic Activity:  Self Care:    PATIENT EDUCATION:  Education details: updated and reviewed HEP Person educated: Patient Education method: Explanation, Demonstration, Tactile cues, Verbal cues, and Handouts Education comprehension: verbalized understanding, returned demonstration, verbal cues required, tactile cues required, and needs further education   HOME EXERCISE PROGRAM: Access Code: [email or web address] URL: [email or web address] Date: 01/26/2024 Prepared by: Tinnie Don  Exercises - Supine Cervical Rotation AROM on Pillow  - 2 x daily - 1 sets - 10 reps - Seated Cervical Flexion AROM  - 2 x daily - 1 sets - 5- 10 reps  ASSESSMENT:  CLINICAL IMPRESSION: Pt with much improving neck pain, and improved rom. She has decreased pain and swelling in foot, but is also on prednisone  this week. Pt to benefit from 1-3 more visits, likley working towards d/c.    Eval: Patient presents with primary complaint of  pain in neck  and L foot . She has had swelling and pain in L lower leg and foot. Is a bit better this week, but has been ongoing. She has minimal pain to palpate or test today, but overall mild weakness in foot. She has stiffness in neck, with decreased ROM for rotation. Pt with decreased ability for full functional activities. Pt will  benefit from skilled PT to improve deficits and pain and to return to PLOF.   OBJECTIVE IMPAIRMENTS: decreased activity tolerance, decreased mobility, decreased ROM, decreased strength, increased muscle spasms, impaired flexibility, improper body mechanics, and pain.   ACTIVITY LIMITATIONS: lifting, standing, squatting, stairs, and locomotion level  PARTICIPATION LIMITATIONS: meal prep, cleaning, laundry, driving, shopping, and community activity  PERSONAL FACTORS: Past/current experiences and Time since onset of injury/illness/exacerbation are also affecting patient's functional outcome.   REHAB POTENTIAL: Good  CLINICAL DECISION MAKING: Evolving/moderate complexity  EVALUATION COMPLEXITY: Moderate   GOALS: Goals reviewed with patient? Yes  SHORT TERM GOALS: Target date: 02/10/2024  Pt to be independent with initial HEP  Goal status: INITIAL  2.  Pt to be compliant with compression sock wearing for L le swelling.   Goal status: INITIAL   LONG TERM GOALS: Target date: 03/16/2024   Pt to be independent with final HEP  Goal status: INITIAL  2.  Pt to demo improved neck rotation to be equal on L and R, without pain, to improve ability for driving and IADLs.   Goal status: INITIAL  3.  Pt to report decreased pain in neck and foot by at least 2 points.   Goal status: INITIAL  4.  Pt to demo ability for ambulation and stairs without pain  in L foot, to improve ability for community activity.   Goal status: INITIAL    PLAN:  PT FREQUENCY: 1-2x/week  PT DURATION: 8 weeks  PLANNED INTERVENTIONS: Therapeutic exercises, Therapeutic activity,  Neuromuscular re-education, Patient/Family education, Self Care, Joint mobilization, Joint manipulation, Stair training, Orthotic/Fit training, DME instructions, Aquatic Therapy, Dry Needling, Electrical stimulation, Cryotherapy, Moist heat, Taping, Ultrasound, Ionotophoresis 4mg /ml Dexamethasone , Manual therapy,  Vasopneumatic device, Traction, Spinal manipulation, Spinal mobilization,Balance training, Gait training,   PLAN FOR NEXT SESSION:    Tinnie Don, PT, DPT 3:20 PM  02/21/24

## 2024-02-22 ENCOUNTER — Encounter: Payer: Self-pay | Admitting: Cardiovascular Disease

## 2024-02-22 ENCOUNTER — Ambulatory Visit: Attending: Cardiovascular Disease | Admitting: Cardiovascular Disease

## 2024-02-22 VITALS — BP 140/60 | HR 69 | Ht 61.0 in | Wt 156.0 lb

## 2024-02-22 DIAGNOSIS — E785 Hyperlipidemia, unspecified: Secondary | ICD-10-CM

## 2024-02-22 DIAGNOSIS — I83892 Varicose veins of left lower extremities with other complications: Secondary | ICD-10-CM | POA: Diagnosis not present

## 2024-02-22 DIAGNOSIS — R600 Localized edema: Secondary | ICD-10-CM | POA: Insufficient documentation

## 2024-02-22 NOTE — Assessment & Plan Note (Signed)
 Lisa Greene was referred to me by Morene Mace, DO because of left lower extremity venous.  She is a cardiology patient of Dr.Chandrasekhar.  The edema began 2 months ago.  It was principally unilateral.  She had a 2D echo that showed normal LV systolic function with grade 2 diastolic dysfunction.  She denies symptoms of heart failure.  She had venous Doppler studies that showed no evidence of DVT.  She was started on prednisone  for another reason recently and as a result her edema almost completely resolved for unclear reasons.  It is no longer an issue.  I do not think there is a vascular etiology.

## 2024-02-22 NOTE — Patient Instructions (Signed)

## 2024-02-22 NOTE — Progress Notes (Signed)
 02/22/2024 Lisa Greene   02/13/1941  [id number]  Primary Physician Katrinka Garnette KIDD, MD Primary Cardiologist: Dorn JINNY Lesches MD [facility], [place], [place]  HPI:  SYNA Lisa Greene is a 83 y.o. 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  She is a patient of Dr.Chandrasekhar.  She was referred by Dr. Morene Mace for left lower extremity edema.  She does have a history of treated hypertension and hyperlipidemia.  She is never smoked.  There is no family history of heart disease.  She is never had heart attack or stroke.  She is fairly active, does gardening and rides her exercise bike 30 to 40 minutes a day without symptoms.  She did have a 2D echocardiogram performed/29/25 that revealed normal LV systolic function, grade 2 diastolic dysfunction, mild MR and mildly elevated RV systolic pressure.  Venous Dopplers revealed no evidence of left lower extremity DVT.  She has no claudication symptoms or pain in her legs.  She started on prednisone  several days ago for another reason and the swelling in her left leg resolved as a result.  She is wearing a compression stocking.   Current Meds  Medication Sig   Albuterol  Sulfate (PROAIR  RESPICLICK) 108 (90 Base) MCG/ACT AEPB Inhale 1 puff into the lungs every 8 (eight) hours as needed (wheezing/shortness of breath).   allopurinol  (ZYLOPRIM ) 100 MG tablet Take 0.5 tablets (50 mg total) by mouth daily.   atorvastatin  (LIPITOR) 20 MG tablet Take 1 tablet (20 mg total) by mouth daily.   azelastine (OPTIVAR) 0.05 % ophthalmic solution    BREZTRI AEROSPHERE 160-9-4.8 MCG/ACT AERO inhaler Inhale into the lungs.   cetirizine (ZYRTEC) 10 MG tablet Take 10 mg by mouth daily.   cholecalciferol (VITAMIN D ) 1000 UNITS tablet Take 2,000 Units by mouth daily.   Cyanocobalamin  (VITAMIN B 12 PO) Take 1,000 mg by mouth every other day.   denosumab  (PROLIA ) 60 MG/ML SOSY injection  INJECT 60 MG INTO THE SKIN EVERY 6 (SIX) MONTHS.   fluticasone  (FLONASE ) 50 MCG/ACT nasal spray Place 2 sprays into both nostrils daily.   irbesartan  (AVAPRO ) 150 MG tablet TAKE 1 TABLET BY MOUTH DAILY   metoprolol  succinate (TOPROL -XL) 50 MG 24 hr tablet TAKE 1 TABLET BY MOUTH DAILY   montelukast (SINGULAIR) 10 MG tablet Take 1 tablet by mouth at bedtime.   omeprazole  (PRILOSEC) 40 MG capsule Take 1 capsule (40 mg total) by mouth daily. Treating for reflux potentially causing cough- does not have traditional reflux symptoms   predniSONE  (DELTASONE ) 20 MG tablet Take 2 pills for 3 days, 1 pill for 4 days   Current Facility-Administered Medications for the 02/22/24 encounter (Office Visit) with Lesches Dorn JINNY, MD  Medication   denosumab  (PROLIA ) injection 60 mg   [START ON 03/15/2024] denosumab  (PROLIA ) injection 60 mg   [START ON 03/15/2024] denosumab  (PROLIA ) injection 60 mg     Allergies  Allergen Reactions   Doxycycline      Nausea    Lisinopril  Other (See Comments) and Cough    Dry cough and stinging rash   Latex Rash   Penicillins Rash    Has patient had a PCN reaction causing immediate rash, facial/tongue/throat swelling, SOB or lightheadedness with hypotension: Yes Has patient had a PCN reaction causing severe rash involving mucus membranes or skin necrosis: No Has patient had a PCN reaction that required hospitalization No Has patient had a PCN reaction occurring  within the last 10 years: No If all of the above answers are NO, then may proceed with Cephalosporin use.    Sulfonamide Derivatives Rash   Tape Rash    Band-aids break out skin!!    Social History   Socioeconomic History   Marital status: Married    Spouse name: Not on file   Number of children: Not on file   Years of education: Not on file   Highest education level: Not on file  Occupational History   Occupation: retired  Tobacco Use   Smoking status: Never   Smokeless tobacco: Never  Vaping Use    Vaping status: Never Used  Substance and Sexual Activity   Alcohol use: No    Alcohol/week: 0.0 standard drinks of alcohol   Drug use: No   Sexual activity: Not Currently    Partners: Male    Birth control/protection: Abstinence, Post-menopausal, None  Other Topics Concern   Not on file  Social History Narrative   Married. No kids- 2 step kids. 5 step grandkids.  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[employer])      Hobbies: beach, read, crochet, knit, watch tv   Social Drivers of Health   Financial Resource Strain: Low Risk  (12/07/2023)   Overall Financial Resource Strain (CARDIA)    Difficulty of Paying Living Expenses: Not hard at all  Food Insecurity: No Food Insecurity (12/07/2023)   Hunger Vital Sign    Worried About Running Out of Food in the Last Year: Never true    Ran Out of Food in the Last Year: Never true  Transportation Needs: No Transportation Needs (12/07/2023)   PRAPARE - Administrator, Civil Service (Medical): No    Lack of Transportation (Non-Medical): No  Physical Activity: Sufficiently Active (12/07/2023)   Exercise Vital Sign    Days of Exercise per Week: 3 days    Minutes of Exercise per Session: 120 min  Stress: No Stress Concern Present (12/07/2023)   [employer] of Occupational Health - Occupational Stress Questionnaire    Feeling of Stress: Not at all  Social Connections: Moderately Integrated (12/07/2023)   Social Connection and Isolation Panel    Frequency of Communication with Friends and Family: More than three times a week    Frequency of Social Gatherings with Friends and Family: More than three times a week    Attends Religious Services: More than 4 times per year    Active Member of [employer] or Organizations: No    Attends Banker Meetings: Never    Marital Status: Married  Catering manager Violence: Not At Risk (12/07/2023)   Humiliation, Afraid, Rape, and Kick questionnaire    Fear of  Current or Ex-Partner: No    Emotionally Abused: No    Physically Abused: No    Sexually Abused: No     Review of Systems: General: negative for chills, fever, night sweats or weight changes.  Cardiovascular: negative for chest pain, dyspnea on exertion, edema, orthopnea, palpitations, paroxysmal nocturnal dyspnea or shortness of breath Dermatological: negative for rash Respiratory: negative for cough or wheezing Urologic: negative for hematuria Abdominal: negative for nausea, vomiting, diarrhea, bright red blood per rectum, melena, or hematemesis Neurologic: negative for visual changes, syncope, or dizziness All other systems reviewed and are otherwise negative except as noted above.    Blood pressure (!) 140/60, pulse 69, height 5' 1 (1.549 m), weight 156 lb (70.8  kg).  General appearance: alert and no distress Neck: no adenopathy, no carotid bruit, no JVD, supple, symmetrical, trachea midline, and thyroid  not enlarged, symmetric, no tenderness/mass/nodules Lungs: clear to auscultation bilaterally Heart: regular rate and rhythm, S1, S2 normal, no murmur, click, rub or gallop Extremities: 1+ left lower extremity edema Pulses: 2+ and symmetric Skin: Skin color, texture, turgor normal. No rashes or lesions Neurologic: Grossly normal  EKG EKG Interpretation Date/Time:  Tuesday February 22 2024 14:10:24 EDT Ventricular Rate:  69 PR Interval:  168 QRS Duration:  84 QT Interval:  434 QTC Calculation: 465 R Axis:   4  Text Interpretation: Sinus rhythm with occasional Premature ventricular complexes Nonspecific ST and T wave abnormality When compared with ECG of 19-Nov-2010 15:02, Premature ventricular complexes are now Present Nonspecific T wave abnormality now evident in Lateral leads Confirmed by Court Carrier [phone]) on 02/22/2024 2:15:32 PM    ASSESSMENT AND PLAN:   Edema of left lower extremity Ms. Brunke was referred to me by Morene Mace, DO because of left lower  extremity venous.  She is a cardiology patient of Dr.Chandrasekhar.  The edema began 2 months ago.  It was principally unilateral.  She had a 2D echo that showed normal LV systolic function with grade 2 diastolic dysfunction.  She denies symptoms of heart failure.  She had venous Doppler studies that showed no evidence of DVT.  She was started on prednisone  for another reason recently and as a result her edema almost completely resolved for unclear reasons.  It is no longer an issue.  I do not think there is a vascular etiology.     Carrier DOROTHA Court MD FACP,FACC,FAHA, [facility] 02/22/2024 2:26 PM

## 2024-02-24 ENCOUNTER — Encounter: Payer: Self-pay | Admitting: Physical Therapy

## 2024-02-24 ENCOUNTER — Ambulatory Visit: Admitting: Physical Therapy

## 2024-02-24 DIAGNOSIS — M542 Cervicalgia: Secondary | ICD-10-CM

## 2024-02-24 DIAGNOSIS — M79605 Pain in left leg: Secondary | ICD-10-CM

## 2024-02-24 DIAGNOSIS — M25572 Pain in left ankle and joints of left foot: Secondary | ICD-10-CM | POA: Diagnosis not present

## 2024-02-24 NOTE — Therapy (Signed)
 OUTPATIENT PHYSICAL THERAPY LOWER EXTREMITY TREATMENT    Patient Name: Lisa Greene MRN: [id number] DOB:1940-12-22, 83 y.o., female Today's Date: 02/24/2024  END OF SESSION:  PT End of Session - 02/24/24 1103     Visit Number 6    Number of Visits 16    Date for Recertification  03/16/24    Authorization Type A[employer]na Medicare    PT Start Time 1103    PT Stop Time 1143    PT Time Calculation (min) 40 min    Activity Tolerance Patient tolerated treatment well    Behavior During Therapy [facility] for tasks assessed/performed            Past Medical History:  Diagnosis Date   ALLERGIC RHINITIS 01/31/2008   Allergy 1960   Asthma 2019   BCC (basal cell carcinoma)sup& nod 03/01/2017   right nostril   Breast cancer of lower-inner quadrant of left female breast (HCC) 11/01/2015   treated with lumpectomy and radiation   Cataract    Colon cancer screening 05/21/2014   No polyps at age 29. Diverticulosis alone-no more colonoscopies.     DIVERTICULITIS, HX OF 09/07/2008   pt denies this    Eosinophilia 02/08/2009   Fibroid    Heart murmur    HYPERLIPIDEMIA 12/20/2006   Hypertension    OSTEOPOROSIS 12/20/2006   PVC's (premature ventricular contractions)    SCC (squamous cell carcinoma) well diff 11/28/2013   left side chin   Vitamin B 12 deficiency    Past Surgical History:  Procedure Laterality Date   BREAST LUMPECTOMY WITH NEEDLE LOCALIZATION Left 01/03/2016   Procedure: BREAST re-excision LUMPECTOMY WITH NEEDLE LOCALIZATION;  Surgeon: Donnice Bury, MD;  Location: [facility];  Service: General;  Laterality: Left;  BREAST re-excision LUMPECTOMY WITH NEEDLE LOCALIZATION   BREAST LUMPECTOMY WITH RADIOACTIVE SEED LOCALIZATION Left 11/28/2015   Procedure: LEFT BREAST LUMPECTOMY WITH BRACKETED RADIOACTIVE SEED LOCALIZATION;  Surgeon: Donnice Bury, MD;  Location: [facility];  Service: General;  Laterality: Left;   BREAST SURGERY  2017   cysts  removed x2   CATARACT EXTRACTION BILATERAL W/ ANTERIOR VITRECTOMY  2013   bilateral cataracts   COLONOSCOPY  2005   tics only    FOOT SURGERY     IR KYPHO LUMBAR INC FX REDUCE BONE BX UNI/BIL CANNULATION INC/IMAGING  05/28/2022   IR RADIOLOGIST EVAL & MGMT  05/21/2022   KNEE ARTHROSCOPY     left   NEUROMA SURGERY     x2 feet   thyroid  duct cyst     Patient Active Problem List   Diagnosis Date Noted   Edema of left lower extremity 02/22/2024   Lumbar facet arthropathy 08/11/2022   Closed compression fracture of L2 lumbar vertebra, with routine healing, subsequent encounter 08/11/2022   HSV-1 (herpes simplex virus 1) infection 04/28/2022   Pain in left ankle and joints of left foot 06/18/2021   Vasomotor rhinitis 05/10/2019   Chronic allergic conjunctivitis 05/10/2019   PVC (premature ventricular contraction) 04/14/2017   Aortic atherosclerosis 11/23/2016   Low vitamin B12 level 06/24/2016   History of breast cancer- Breast cancer of lower-inner quadrant of left female breast 11/01/2015   Stage 3b chronic kidney disease (HCC) 10/22/2015   Family history of abdominal aortic aneurysm 03/08/2015   Asthma, moderate persistent, well-controlled 04/19/2014   Hypertension 11/25/2010   Eosinophilia 02/08/2009   Allergic rhinitis 01/31/2008   Hyperlipidemia 12/20/2006   Osteoporosis 12/20/2006     PCP: Garnette Lukes  REFERRING PROVIDER:  Odis Mace  REFERRING DIAG: Pain in L ankle,  Neck pain   THERAPY DIAG:  Pain in left ankle and joints of left foot  Pain in left leg  Cervicalgia  Rationale for Evaluation and Treatment: Rehabilitation  ONSET DATE:    SUBJECTIVE:   SUBJECTIVE STATEMENT: Pt finished prednisone , leg still not swollen. Did have vascular appt, they did not think it was vascular in nature. Neck still doing quite well.   Neck Pain: increased pain for about 2 months, no incident to report. Woke up and couldn't turn head, thought it would get better. R  side still hurts, mostly with with R rotation, feels limited, Also hurts on R with L rotation. Difficulty laying on back due to neck pain. Increased pain In back of head.   L foot and lower leg started swelling about 2-3 months. Sometimes painful- up to 10/10. Can also be fine. Painful daily. Standing is worst pain.    PERTINENT HISTORY: CKD, breast CA, L2 comp fracture, Osteoporosis  PAIN:  Are you having pain? Yes: NPRS scale: 3-4/10 Pain location: Neck  Pain description: sore, stiff Aggravating factors: movement, rotation Relieving factors: none stated   Are you having pain? Yes: NPRS scale: up to 4/10 Pain location: Foot Left  Pain description: swollen, painful Aggravating factors: Standing, going down steps  Relieving factors: none stated    PRECAUTIONS: None  WEIGHT BEARING RESTRICTIONS: No  FALLS:  Has patient fallen in last 6 months? No   PLOF: Independent  PATIENT GOALS:  Decreased pain in neck and foot   NEXT MD VISIT:   OBJECTIVE:   DIAGNOSTIC FINDINGS:   PATIENT SURVEYS:    COGNITION: Overall cognitive status: Within functional limits for tasks assessed     SENSATION: WFL  EDEMA: Mild in L foot,   mild/mod in L lower leg    POSTURE:    No Significant postural limitations  PALPATION:   LOWER EXTREMITY ROM: Cervical Flexion: WFL,   L rot: 70, R rot: 65 , Ext: mild/mod limitation Shoulders: WFL/mild limitation for flexion  Ankle:  mild limitation for DF,  other WFL .    LOWER EXTREMITY MMT:  MMT Left eval Right  eval  Shoulder  flexion 4 4   extension    Shoulder   abduction 4 4  adduction    Shouder internal rotation    Shoulder  external rotation    Knee flexion    Knee extension    Ankle dorsiflexion 4   Ankle plantarflexion 4   Ankle inversion 4   Ankle eversion 4    (Blank rows = not tested)  SPECIAL TESTS:   FUNCTIONAL TESTS:   GAIT: Unremarkable   TODAY'S TREATMENT:                                                                                                                               DATE:  02/24/24 Therapeutic Exercise:  education on final HEP  Aerobic: Supine: Seated:  thoracic rotation x 10,  cervical rotation x 10;  cervical nod for flexion stretch x 10;  side bending stretch x 4 bil;  Standing:    shoulder ER x 15;  shoulder flexion  2 x  6   ; scap squeeze x 12  Stretches:  Neuromuscular Re-education: Manual Therapy:  prom for rotation, Cervical PA mobs, cervical distraction ; manual UT stretching, Light distraction, STM/TPR to R UT and levator (in sitting)  Therapeutic Activity:  Self Care:    Therapeutic Exercise: reviewed HEP  Aerobic: Supine: Seated:  heel raises x 20 ;   ankle PF RTB x 20;  thoracic rotation x 10,  cervical rotation x 10;   R Side bending stretch x 3,  Standing:  tandem stance 20 sec x 5;  gastroc stretch x 3 bil;   Marching x 25;  Stretches:  Neuromuscular Re-education: Manual Therapy:  prom for rotation, Cervical PA mobs, cervical distraction ; manual UT stretching, Light distraction,  Therapeutic Activity:  Self Care:    PATIENT EDUCATION:  Education details: updated and reviewed HEP Person educated: Patient Education method: Explanation, Demonstration, Tactile cues, Verbal cues, and Handouts Education comprehension: verbalized understanding, returned demonstration, verbal cues required, tactile cues required, and needs further education   HOME EXERCISE PROGRAM: Access Code: [email or web address] URL: [email or web address] Date: 01/26/2024 Prepared by: Tinnie Don  Exercises - Supine Cervical Rotation AROM on Pillow  - 2 x daily - 1 sets - 10 reps - Seated Cervical Flexion AROM  - 2 x daily - 1 sets - 5- 10 reps  ASSESSMENT:  CLINICAL IMPRESSION: Pt with much improving neck pain, and improved rom. She also has much decreased swelling and no pain in foot. Just finished steroid. She has met goals at this time for pain. Pt to benefit from  1 more visit, to ensure pain relief not just from taking steroid. She will schedule 1 more visit, then likely d/c to HEP. Final hep reviewed today.    Eval: Patient presents with primary complaint of  pain in neck and L foot . She has had swelling and pain in L lower leg and foot. Is a bit better this week, but has been ongoing. She has minimal pain to palpate or test today, but overall mild weakness in foot. She has stiffness in neck, with decreased ROM for rotation. Pt with decreased ability for full functional activities. Pt will  benefit from skilled PT to improve deficits and pain and to return to PLOF.   OBJECTIVE IMPAIRMENTS: decreased activity tolerance, decreased mobility, decreased ROM, decreased strength, increased muscle spasms, impaired flexibility, improper body mechanics, and pain.   ACTIVITY LIMITATIONS: lifting, standing, squatting, stairs, and locomotion level  PARTICIPATION LIMITATIONS: meal prep, cleaning, laundry, driving, shopping, and community activity  PERSONAL FACTORS: Past/current experiences and Time since onset of injury/illness/exacerbation are also affecting patient's functional outcome.   REHAB POTENTIAL: Good  CLINICAL DECISION MAKING: Evolving/moderate complexity  EVALUATION COMPLEXITY: Moderate   GOALS: Goals reviewed with patient? Yes  SHORT TERM GOALS: Target date: 02/10/2024  Pt to be independent with initial HEP  Goal status:MET  2.  Pt to be compliant with compression sock wearing for L le swelling.   Goal status:MET   LONG TERM GOALS: Target date: 03/16/2024   Pt to be independent with final HEP  Goal status: MET  2.  Pt to demo improved neck rotation to be equal on L and R, without pain, to improve ability for driving and IADLs.  Goal status: MET  3.  Pt to report decreased pain in neck and foot by at least 2 points.   Goal status: MET  4.  Pt to demo ability for ambulation and stairs without pain in L foot, to improve ability  for community activity.   Goal status: In progress     PLAN:  PT FREQUENCY: 1-2x/week  PT DURATION: 8 weeks  PLANNED INTERVENTIONS: Therapeutic exercises, Therapeutic activity, Neuromuscular re-education, Patient/Family education, Self Care, Joint mobilization, Joint manipulation, Stair training, Orthotic/Fit training, DME instructions, Aquatic Therapy, Dry Needling, Electrical stimulation, Cryotherapy, Moist heat, Taping, Ultrasound, Ionotophoresis 4mg /ml Dexamethasone , Manual therapy,  Vasopneumatic device, Traction, Spinal manipulation, Spinal mobilization,Balance training, Gait training,   PLAN FOR NEXT SESSION:  test stairs, review UE rom/strength/ mechanics for not over using neck with IADLs.    Tinnie Don, PT, DPT 11:04 AM  02/24/24

## 2024-02-25 DIAGNOSIS — Z008 Encounter for other general examination: Secondary | ICD-10-CM | POA: Diagnosis not present

## 2024-02-29 ENCOUNTER — Encounter: Admitting: Physical Therapy

## 2024-03-02 ENCOUNTER — Encounter: Admitting: Physical Therapy

## 2024-03-10 ENCOUNTER — Encounter: Payer: Self-pay | Admitting: Physical Therapy

## 2024-03-10 ENCOUNTER — Ambulatory Visit: Admitting: Physical Therapy

## 2024-03-10 DIAGNOSIS — M25572 Pain in left ankle and joints of left foot: Secondary | ICD-10-CM

## 2024-03-10 DIAGNOSIS — M542 Cervicalgia: Secondary | ICD-10-CM

## 2024-03-10 DIAGNOSIS — M79605 Pain in left leg: Secondary | ICD-10-CM

## 2024-03-10 NOTE — Therapy (Signed)
 OUTPATIENT PHYSICAL THERAPY LOWER EXTREMITY TREATMENT    Patient Name: Lisa Greene MRN: [id number] DOB:April 17, 1941, 83 y.o., female Today's Date: 03/10/2024  END OF SESSION:  PT End of Session - 03/10/24 1050     Visit Number 7    Number of Visits 16    Date for Recertification  03/16/24    Authorization Type [employer]    PT Start Time 1051    PT Stop Time 1130    PT Time Calculation (min) 39 min    Activity Tolerance Patient tolerated treatment well    Behavior During Therapy [facility] for tasks assessed/performed            Past Medical History:  Diagnosis Date   ALLERGIC RHINITIS 01/31/2008   Allergy 1960   Asthma 2019   BCC (basal cell carcinoma)sup& nod 03/01/2017   right nostril   Breast cancer of lower-inner quadrant of left female breast (HCC) 11/01/2015   treated with lumpectomy and radiation   Cataract    Colon cancer screening 05/21/2014   No polyps at age 17. Diverticulosis alone-no more colonoscopies.     DIVERTICULITIS, HX OF 09/07/2008   pt denies this    Eosinophilia 02/08/2009   Fibroid    Heart murmur    HYPERLIPIDEMIA 12/20/2006   Hypertension    OSTEOPOROSIS 12/20/2006   PVC's (premature ventricular contractions)    SCC (squamous cell carcinoma) well diff 11/28/2013   left side chin   Vitamin B 12 deficiency    Past Surgical History:  Procedure Laterality Date   BREAST LUMPECTOMY WITH NEEDLE LOCALIZATION Left 01/03/2016   Procedure: BREAST re-excision LUMPECTOMY WITH NEEDLE LOCALIZATION;  Surgeon: Donnice Bury, MD;  Location: [facility];  Service: General;  Laterality: Left;  BREAST re-excision LUMPECTOMY WITH NEEDLE LOCALIZATION   BREAST LUMPECTOMY WITH RADIOACTIVE SEED LOCALIZATION Left 11/28/2015   Procedure: LEFT BREAST LUMPECTOMY WITH BRACKETED RADIOACTIVE SEED LOCALIZATION;  Surgeon: Donnice Bury, MD;  Location: [facility];  Service: General;  Laterality: Left;   BREAST SURGERY  2017   cysts  removed x2   CATARACT EXTRACTION BILATERAL W/ ANTERIOR VITRECTOMY  2013   bilateral cataracts   COLONOSCOPY  2005   tics only    FOOT SURGERY     IR KYPHO LUMBAR INC FX REDUCE BONE BX UNI/BIL CANNULATION INC/IMAGING  05/28/2022   IR RADIOLOGIST EVAL & MGMT  05/21/2022   KNEE ARTHROSCOPY     left   NEUROMA SURGERY     x2 feet   thyroid  duct cyst     Patient Active Problem List   Diagnosis Date Noted   Edema of left lower extremity 02/22/2024   Lumbar facet arthropathy 08/11/2022   Closed compression fracture of L2 lumbar vertebra, with routine healing, subsequent encounter 08/11/2022   HSV-1 (herpes simplex virus 1) infection 04/28/2022   Pain in left ankle and joints of left foot 06/18/2021   Vasomotor rhinitis 05/10/2019   Chronic allergic conjunctivitis 05/10/2019   PVC (premature ventricular contraction) 04/14/2017   Aortic atherosclerosis 11/23/2016   Low vitamin B12 level 06/24/2016   History of breast cancer- Breast cancer of lower-inner quadrant of left female breast 11/01/2015   Stage 3b chronic kidney disease (HCC) 10/22/2015   Family history of abdominal aortic aneurysm 03/08/2015   Asthma, moderate persistent, well-controlled 04/19/2014   Hypertension 11/25/2010   Eosinophilia 02/08/2009   Allergic rhinitis 01/31/2008   Hyperlipidemia 12/20/2006   Osteoporosis 12/20/2006     PCP: Garnette Lukes  REFERRING PROVIDER:  Odis Mace  REFERRING DIAG: Pain in L ankle,  Neck pain   THERAPY DIAG:  Pain in left ankle and joints of left foot  Pain in left leg  Cervicalgia  Rationale for Evaluation and Treatment: Rehabilitation  ONSET DATE:    SUBJECTIVE:   SUBJECTIVE STATEMENT: Pt finished prednisone , leg still doing very well. Her neck is also doing well. Feels mild stiffness on R at times.   Neck Pain: increased pain for about 2 months, no incident to report. Woke up and couldn't turn head, thought it would get better. R side still hurts, mostly with with  R rotation, feels limited, Also hurts on R with L rotation. Difficulty laying on back due to neck pain. Increased pain In back of head.   L foot and lower leg started swelling about 2-3 months. Sometimes painful- up to 10/10. Can also be fine. Painful daily. Standing is worst pain.    PERTINENT HISTORY: CKD, breast CA, L2 comp fracture, Osteoporosis  PAIN:  Are you having pain? Yes: NPRS scale: 3-4/10 Pain location: Neck  Pain description: sore, stiff Aggravating factors: movement, rotation Relieving factors: none stated   Are you having pain? Yes: NPRS scale: up to 4/10 Pain location: Foot Left  Pain description: swollen, painful Aggravating factors: Standing, going down steps  Relieving factors: none stated    PRECAUTIONS: None  WEIGHT BEARING RESTRICTIONS: No  FALLS:  Has patient fallen in last 6 months? No   PLOF: Independent  PATIENT GOALS:  Decreased pain in neck and foot   NEXT MD VISIT:   OBJECTIVE:   DIAGNOSTIC FINDINGS:   PATIENT SURVEYS:    COGNITION: Overall cognitive status: Within functional limits for tasks assessed     SENSATION: WFL  EDEMA: Mild in L foot,   mild/mod in L lower leg    POSTURE:    No Significant postural limitations  PALPATION:   LOWER EXTREMITY ROM: Cervical Flexion: WFL,   L rot: 70, R rot: 65 , Ext: mild/mod limitation Shoulders: WFL/mild limitation for flexion  Ankle:  mild limitation for DF,  other WFL .    LOWER EXTREMITY MMT:  MMT Left eval Right  eval  Shoulder  flexion 4 4   extension    Shoulder   abduction 4 4  adduction    Shouder internal rotation    Shoulder  external rotation    Knee flexion    Knee extension    Ankle dorsiflexion 4   Ankle plantarflexion 4   Ankle inversion 4   Ankle eversion 4    (Blank rows = not tested)  SPECIAL TESTS:   FUNCTIONAL TESTS:   GAIT: Unremarkable   TODAY'S TREATMENT:                                                                                                                               DATE:  03/10/24 Therapeutic Exercise:  Education on final HEP  Aerobic: Supine: LTR  x 10,  SKTC 20 sec x 2;  Seated:  thoracic rotation x 10,  cervical rotation x 10;  cervical nod for flexion stretch x 10;   Standing:    shoulder ER x 15;  shoulder flexion  2 x  6 ;  scap squeeze x 12  Stretches:  Neuromuscular Re-education: Manual Therapy:  prom for rotation, Cervical PA mobs, cervical distraction ; manual UT stretching, Light distraction,  Therapeutic Activity:  Self Care:    Therapeutic Exercise: reviewed HEP  Aerobic: Supine: Seated:  heel raises x 20 ;   ankle PF RTB x 20;  thoracic rotation x 10,  cervical rotation x 10;   R Side bending stretch x 3,  Standing:  tandem stance 20 sec x 5;  gastroc stretch x 3 bil;   Marching x 25;  Stretches:  Neuromuscular Re-education: Manual Therapy:  prom for rotation, Cervical PA mobs, cervical distraction ; manual UT stretching, Light distraction,  Therapeutic Activity:  Self Care:    PATIENT EDUCATION:  Education details: updated and reviewed HEP Person educated: Patient Education method: Explanation, Demonstration, Tactile cues, Verbal cues, and Handouts Education comprehension: verbalized understanding, returned demonstration, verbal cues required, tactile cues required, and needs further education   HOME EXERCISE PROGRAM: Access Code: [email or web address]   ASSESSMENT:  CLINICAL IMPRESSION:  Pt with much improved neck pain, and improved rom. She has mild tightness on R intermittently. She also has much decreased swelling and no pain in foot. She has met goals at this time. Final Hep reviewed today. Pt in agreement with plan.   Eval: Patient presents with primary complaint of  pain in neck and L foot . She has had swelling and pain in L lower leg and foot. Is a bit better this week, but has been ongoing. She has minimal pain to palpate or test today, but overall mild weakness in foot. She has  stiffness in neck, with decreased ROM for rotation. Pt with decreased ability for full functional activities. Pt will  benefit from skilled PT to improve deficits and pain and to return to PLOF.   OBJECTIVE IMPAIRMENTS: decreased activity tolerance, decreased mobility, decreased ROM, decreased strength, increased muscle spasms, impaired flexibility, improper body mechanics, and pain.   ACTIVITY LIMITATIONS: lifting, standing, squatting, stairs, and locomotion level  PARTICIPATION LIMITATIONS: meal prep, cleaning, laundry, driving, shopping, and community activity  PERSONAL FACTORS: Past/current experiences and Time since onset of injury/illness/exacerbation are also affecting patient's functional outcome.   REHAB POTENTIAL: Good  CLINICAL DECISION MAKING: Evolving/moderate complexity  EVALUATION COMPLEXITY: Moderate   GOALS: Goals reviewed with patient? Yes  SHORT TERM GOALS: Target date: 02/10/2024  Pt to be independent with initial HEP  Goal status:MET  2.  Pt to be compliant with compression sock wearing for L le swelling.   Goal status:MET   LONG TERM GOALS: Target date: 03/16/2024   Pt to be independent with final HEP  Goal status: MET  2.  Pt to demo improved neck rotation to be equal on L and R, without pain, to improve ability for driving and IADLs.   Goal status: MET  3.  Pt to report decreased pain in neck and foot by at least 2 points.   Goal status: MET  4.  Pt to demo ability for ambulation and stairs without pain in L foot, to improve ability for community activity.   Goal status: MET    PLAN:  PT FREQUENCY: 1-2x/week  PT DURATION: 8 weeks  PLANNED INTERVENTIONS: Therapeutic exercises,  Therapeutic activity, Neuromuscular re-education, Patient/Family education, Self Care, Joint mobilization, Joint manipulation, Stair training, Orthotic/Fit training, DME instructions, Aquatic Therapy, Dry Needling, Electrical stimulation, Cryotherapy, Moist heat,  Taping, Ultrasound, Ionotophoresis 4mg /ml Dexamethasone , Manual therapy,  Vasopneumatic device, Traction, Spinal manipulation, Spinal mobilization,Balance training, Gait training,   PLAN FOR NEXT SESSION:  test stairs, review UE rom/strength/ mechanics for not over using neck with IADLs.    Tinnie Don, PT, DPT 10:50 AM  03/10/24   PHYSICAL THERAPY DISCHARGE SUMMARY  Visits from Start of Care: 7   Plan: Patient agrees to discharge.  Patient goals were met. Patient is being discharged due to meeting the stated rehab goals.     Tinnie Don, PT, DPT 10:50 AM  03/10/24

## 2024-03-16 ENCOUNTER — Telehealth: Payer: Self-pay | Admitting: Family Medicine

## 2024-03-16 NOTE — Telephone Encounter (Signed)
 Will update patients phone number    Copied from CRM [id number]. Topic: General - Other >> Mar 16, 2024  8:20 AM Lisa Greene wrote: Reason for CRM: patient called stating the office called her stating they was going  to send a code to her old phone number but she does not have that number anymore.Patient wanted the office to have the correct number which is [phone]

## 2024-03-22 DIAGNOSIS — Z961 Presence of intraocular lens: Secondary | ICD-10-CM | POA: Diagnosis not present

## 2024-03-23 ENCOUNTER — Ambulatory Visit

## 2024-03-23 MED ORDER — DENOSUMAB 60 MG/ML ~~LOC~~ SOSY
60.0000 mg | PREFILLED_SYRINGE | SUBCUTANEOUS | Status: AC
  Administered 2024-03-28: 60 mg via SUBCUTANEOUS

## 2024-03-23 NOTE — Progress Notes (Deleted)
 Patient is in office today for a nurse visit for Prolia . Patient {NurseVisitDetails:[id number]}

## 2024-03-28 ENCOUNTER — Ambulatory Visit

## 2024-03-28 DIAGNOSIS — M8000XS Age-related osteoporosis with current pathological fracture, unspecified site, sequela: Secondary | ICD-10-CM

## 2024-03-28 DIAGNOSIS — M8000XD Age-related osteoporosis with current pathological fracture, unspecified site, subsequent encounter for fracture with routine healing: Secondary | ICD-10-CM

## 2024-03-28 MED ORDER — DENOSUMAB 60 MG/ML ~~LOC~~ SOSY: 60.0000 mg | PREFILLED_SYRINGE | SUBCUTANEOUS | Status: AC

## 2024-03-28 NOTE — Progress Notes (Signed)
 Patient is in office today for a nurse visit for Prolia . Patient Injection was given in the  Right arm. Patient tolerated injection well. Order placed for next injection q6 months. Patient scheduling appt on way out.

## 2024-04-03 ENCOUNTER — Encounter: Payer: Self-pay | Admitting: Family Medicine

## 2024-04-03 ENCOUNTER — Ambulatory Visit: Admitting: Family Medicine

## 2024-04-03 VITALS — BP 128/60 | HR 67 | Temp 97.2°F | Ht 61.0 in | Wt 149.0 lb

## 2024-04-03 DIAGNOSIS — J019 Acute sinusitis, unspecified: Secondary | ICD-10-CM | POA: Diagnosis not present

## 2024-04-03 DIAGNOSIS — B9789 Other viral agents as the cause of diseases classified elsewhere: Secondary | ICD-10-CM | POA: Diagnosis not present

## 2024-04-03 DIAGNOSIS — J4540 Moderate persistent asthma, uncomplicated: Secondary | ICD-10-CM | POA: Diagnosis not present

## 2024-04-03 DIAGNOSIS — M1A00X0 Idiopathic chronic gout, unspecified site, without tophus (tophi): Secondary | ICD-10-CM | POA: Diagnosis not present

## 2024-04-03 DIAGNOSIS — L820 Inflamed seborrheic keratosis: Secondary | ICD-10-CM | POA: Diagnosis not present

## 2024-04-03 DIAGNOSIS — R208 Other disturbances of skin sensation: Secondary | ICD-10-CM | POA: Diagnosis not present

## 2024-04-03 DIAGNOSIS — R051 Acute cough: Secondary | ICD-10-CM

## 2024-04-03 DIAGNOSIS — L814 Other melanin hyperpigmentation: Secondary | ICD-10-CM | POA: Diagnosis not present

## 2024-04-03 DIAGNOSIS — Z789 Other specified health status: Secondary | ICD-10-CM | POA: Diagnosis not present

## 2024-04-03 DIAGNOSIS — I10 Essential (primary) hypertension: Secondary | ICD-10-CM | POA: Diagnosis not present

## 2024-04-03 DIAGNOSIS — L538 Other specified erythematous conditions: Secondary | ICD-10-CM | POA: Diagnosis not present

## 2024-04-03 DIAGNOSIS — L2989 Other pruritus: Secondary | ICD-10-CM | POA: Diagnosis not present

## 2024-04-03 DIAGNOSIS — D485 Neoplasm of uncertain behavior of skin: Secondary | ICD-10-CM | POA: Diagnosis not present

## 2024-04-03 LAB — POC COVID19 BINAXNOW: SARS Coronavirus 2 Ag: NEGATIVE

## 2024-04-03 MED ORDER — PREDNISONE 20 MG PO TABS: ORAL_TABLET | ORAL | 0 refills | Status: DC

## 2024-04-03 NOTE — Patient Instructions (Addendum)
 illness day 4 today- could be viral sinusitis- if continuing to worsen by Thursday or if improves then symptom(s) worsen would be indications for antibiotics for her sinuses -with allergen exposure and her asthma- we opted to trial prednisone  as well while we wait -covid negative  See your podiatrist  Recommended follow up: Return for as needed for new, worsening, persistent symptoms.

## 2024-04-03 NOTE — Progress Notes (Signed)
 Phone [phone] In person visit   Subjective:   Lisa Greene is a 83 y.o. year old very pleasant female patient who presents for/with See problem oriented charting Chief Complaint  Patient presents with   Cough    Started Saturday night; no known fever but has been waking up hot and sweaty and then develops chills; headache;    sinus pressure    Sinus pressure above eyes; no nasal drainage;     Past Medical History-  Patient Active Problem List   Diagnosis Date Noted   PVC (premature ventricular contraction) 04/14/2017    Priority: High   History of breast cancer- Breast cancer of lower-inner quadrant of left female breast 11/01/2015    Priority: High   Low vitamin B12 level 06/24/2016    Priority: Medium    Stage 3b chronic kidney disease (HCC) 10/22/2015    Priority: Medium    Asthma, moderate persistent, well-controlled 04/19/2014    Priority: Medium    Hypertension 11/25/2010    Priority: Medium    Hyperlipidemia 12/20/2006    Priority: Medium    Osteoporosis 12/20/2006    Priority: Medium    HSV-1 (herpes simplex virus 1) infection 04/28/2022    Priority: Low   Vasomotor rhinitis 05/10/2019    Priority: Low   Chronic allergic conjunctivitis 05/10/2019    Priority: Low   Aortic atherosclerosis 11/23/2016    Priority: Low   Family history of abdominal aortic aneurysm 03/08/2015    Priority: Low   Eosinophilia 02/08/2009    Priority: Low   Allergic rhinitis 01/31/2008    Priority: Low   Lumbar facet arthropathy 08/11/2022    Priority: 1.   Closed compression fracture of L2 lumbar vertebra, with routine healing, subsequent encounter 08/11/2022    Priority: 1.   Pain in left ankle and joints of left foot 06/18/2021    Priority: 1.   Edema of left lower extremity 02/22/2024    Medications- reviewed and updated Current Outpatient Medications  Medication Sig Dispense Refill   Albuterol  Sulfate (PROAIR  RESPICLICK) 108 (90 Base) MCG/ACT AEPB Inhale 1 puff  into the lungs every 8 (eight) hours as needed (wheezing/shortness of breath).     allopurinol  (ZYLOPRIM ) 100 MG tablet Take 0.5 tablets (50 mg total) by mouth daily. 30 tablet 3   atorvastatin  (LIPITOR) 20 MG tablet Take 1 tablet (20 mg total) by mouth daily. 90 tablet 3   azelastine (OPTIVAR) 0.05 % ophthalmic solution      BREZTRI AEROSPHERE 160-9-4.8 MCG/ACT AERO inhaler Inhale into the lungs.     cetirizine (ZYRTEC) 10 MG tablet Take 10 mg by mouth daily.     cholecalciferol (VITAMIN D ) 1000 UNITS tablet Take 2,000 Units by mouth daily.     Cyanocobalamin  (VITAMIN B 12 PO) Take 1,000 mg by mouth every other day.     denosumab  (PROLIA ) 60 MG/ML SOSY injection INJECT 60 MG INTO THE SKIN EVERY 6 (SIX) MONTHS. 1 mL 1   fluticasone  (FLONASE ) 50 MCG/ACT nasal spray Place 2 sprays into both nostrils daily. 48 g 3   irbesartan  (AVAPRO ) 150 MG tablet TAKE 1 TABLET BY MOUTH DAILY 90 tablet 3   metoprolol  succinate (TOPROL -XL) 50 MG 24 hr tablet TAKE 1 TABLET BY MOUTH DAILY 90 tablet 3   montelukast (SINGULAIR) 10 MG tablet Take 1 tablet by mouth at bedtime.     omeprazole  (PRILOSEC) 40 MG capsule Take 1 capsule (40 mg total) by mouth daily. Treating for reflux potentially causing cough- does not  have traditional reflux symptoms 30 capsule 3   predniSONE  (DELTASONE ) 20 MG tablet Take 2 pills for 3 days, 1 pill for 4 days 10 tablet 0   Current Facility-Administered Medications  Medication Dose Route Frequency Provider Last Rate Last Admin   denosumab  (PROLIA ) injection 60 mg  60 mg Subcutaneous Q6 months Wendolyn Jenkins Jansky, MD   60 mg at 09/14/23 0912   denosumab  (PROLIA ) injection 60 mg  60 mg Subcutaneous Once Kulik, Ann Marie, MD       denosumab  (PROLIA ) injection 60 mg  60 mg Subcutaneous Once Kulik, Ann Marie, MD       denosumab  (PROLIA ) injection 60 mg  60 mg Subcutaneous Q6 months Katrinka Garnette KIDD, MD   60 mg at 03/28/24 1005   denosumab  (PROLIA ) injection 60 mg  60 mg Subcutaneous Q6 months  Katrinka Garnette KIDD, MD         Objective:  BP 128/60 (BP Location: Left Arm, Patient Position: Sitting, Cuff Size: Normal)   Pulse 67   Temp (!) 97.2 F (36.2 C) (Temporal)   Ht 5' 1 (1.549 m)   Wt 149 lb (67.6 kg)   SpO2 97%   BMI 28.15 kg/m  Gen: NAD, resting comfortably Tympanic membrane normal, frontal sinus pressure without maxillary sinus pressure.  Nasal turbinates edematous with clear drainage, pharynx with some drainage noted and mild erythema CV: RRR no rubs or gallops Lungs: CTAB no crackles, wheeze, rhonchi Skin: warm, dry  Results for orders placed or performed in visit on 04/03/24 (from the past 24 hours)  POC COVID-19     Status: None   Collection Time: 04/03/24  3:26 PM  Result Value Ref Range   SARS Coronavirus 2 Ag Negative Negative       Assessment and Plan   # Cough/sinus pressure S: Patient started with cough on Friday night. Light amount of phlegm- clear. Nsal discharge clear.  Has not noticed any fever but has been waking up hot and sweaty and had chills as well- that part has improved thankfully.  Has noted some headaches along with some sinus pressure above her eyes without nasal drainage. Mildly hoarse. Cough feels up in throat for most part- mild upper chest congestion earlier today. No wheezing.  - has been working in yard picking up [employer] and has allergy - coughs a little more after treatment- wears mask - using her Breztri consistently twice daily - has not tried her albuterol  yet.  - mildly worsening other than chills better A/P: illness day 4 today- could be viral sinusitis- if continuing to worsen by Thursday or if improves then symptom(s) worsen would be indications for antibiotics for her sinuses -with allergen exposure and her asthma- we opted to trial prednisone  as well while we wait -covid negative -declined flu testing  #left pain around MTP joints- recommended podiatry visit- gets bruised discoloration and is tender around the MTP  joints toes 2 through 4-seems to have a dropped arch in this area  #Gout S: Medication: allopurinol  50 mg, also stopped hctz  Lab Results  Component Value Date   LABURIC 5.8 02/16/2024    A/P: Uric acid has been at goal-I do not think her foot pain represents gout flare especially with well-controlled uric acid but she could get podiatry's opinion on fluid analysis if able to obtain fluid  #hypertension with white coat element/PVCs on metoprolol  S: medication: irbesartan  150mg , metoprolol  50 mg XR A/P: Well-controlled-continue current medication  # Asthma/allergies- follows with Dr. Laurinda to  be well-controlled but with possible allergic element we opted to add prednisone  to her regimen today along with ongoing Breztri.  Has not needed albuterol  yet.  Recommended follow up: Return for as needed for new, worsening, persistent symptoms. Future Appointments  Date Time Provider Department Center  08/16/2024  8:00 AM Katrinka Garnette KIDD, MD [facility]  12/11/2024  9:20 AM [facility] ANNUAL WELLNESS VISIT 1 [facility]    Lab/Order associations:   ICD-10-CM   1. Acute cough  R05.1 POC COVID-19    2. Acute viral sinusitis  J01.90    B97.89     3. Asthma, moderate persistent, well-controlled  J45.40     4. Primary hypertension  I10     5. Idiopathic chronic gout without tophus, unspecified site  M1A.00X0      Meds ordered this encounter  Medications   predniSONE  (DELTASONE ) 20 MG tablet    Sig: Take 2 pills for 3 days, 1 pill for 4 days    Dispense:  10 tablet    Refill:  0    Return precautions advised.  Garnette Katrinka, MD

## 2024-04-04 ENCOUNTER — Ambulatory Visit

## 2024-04-13 ENCOUNTER — Ambulatory Visit

## 2024-04-13 DIAGNOSIS — Z23 Encounter for immunization: Secondary | ICD-10-CM

## 2024-04-13 NOTE — Progress Notes (Signed)
 Patient is in office today for a nurse visit for influenza Vaccine. Patient Injection was given in the  Right deltoid. Patient tolerated injection well. No other questions or concerns were addressed during this visit.

## 2024-04-14 ENCOUNTER — Ambulatory Visit

## 2024-04-21 DIAGNOSIS — J300 Vasomotor rhinitis: Secondary | ICD-10-CM | POA: Diagnosis not present

## 2024-04-21 DIAGNOSIS — H1045 Other chronic allergic conjunctivitis: Secondary | ICD-10-CM | POA: Diagnosis not present

## 2024-04-21 DIAGNOSIS — J4540 Moderate persistent asthma, uncomplicated: Secondary | ICD-10-CM | POA: Diagnosis not present

## 2024-06-16 ENCOUNTER — Telehealth: Payer: Self-pay

## 2024-06-16 DIAGNOSIS — Z131 Encounter for screening for diabetes mellitus: Secondary | ICD-10-CM

## 2024-06-16 DIAGNOSIS — E785 Hyperlipidemia, unspecified: Secondary | ICD-10-CM

## 2024-06-16 DIAGNOSIS — R7989 Other specified abnormal findings of blood chemistry: Secondary | ICD-10-CM

## 2024-06-16 DIAGNOSIS — I10 Essential (primary) hypertension: Secondary | ICD-10-CM

## 2024-06-16 DIAGNOSIS — E663 Overweight: Secondary | ICD-10-CM

## 2024-06-16 NOTE — Telephone Encounter (Signed)
 Please advise if ok to place order before CPE.

## 2024-06-16 NOTE — Telephone Encounter (Signed)
 I ordered these-please set her up for labs

## 2024-06-16 NOTE — Addendum Note (Signed)
 Addended by: KATRINKA GARNETTE KIDD on: 06/16/2024 05:55 PM   Modules accepted: Orders

## 2024-06-16 NOTE — Telephone Encounter (Signed)
 Copied from CRM [id number]. Topic: Clinical - Request for Lab/Test Order >> Jun 16, 2024  3:29 PM Rea ORN wrote: Reason for CRM: Pt calling to request labs to be ordered prior to her March CPE. Please call back to advise when she can schedule her lab appt, [phone]

## 2024-06-19 NOTE — Telephone Encounter (Signed)
 LVM to schedule prior labs for CPE

## 2024-06-21 ENCOUNTER — Telehealth: Payer: Self-pay | Admitting: Family Medicine

## 2024-06-21 NOTE — Telephone Encounter (Signed)
 Called patient and scheduled her to see Dr. Kennyth 06/23/2023 at 0820. Patient requests only to see an MD since Dr. Katrinka is out. Patient declined triage again and wants to wait to be seen tomorrow.

## 2024-06-21 NOTE — Telephone Encounter (Unsigned)
 Copied from CRM #[id number]. Topic: Clinical - Refused Triage >> Jun 21, 2024  1:54 PM Vena HERO wrote: Patient/caller voiced complaints of Facial swelling. Declined transfer to triage. Pt called in to schedule an appt with Dr Katrinka and when I asked probing questions for why she needed an appt, she said she woke up with facial swelling and shoulder pain. I offered to bring a nurse on the line but she was adamant to see Dr Katrinka and asked to be put on the wait list.

## 2024-06-22 ENCOUNTER — Ambulatory Visit: Admitting: Family Medicine

## 2024-06-22 ENCOUNTER — Encounter: Payer: Self-pay | Admitting: Family Medicine

## 2024-06-22 VITALS — BP 120/80 | HR 69 | Temp 97.2°F | Ht 61.0 in | Wt 148.0 lb

## 2024-06-22 DIAGNOSIS — R220 Localized swelling, mass and lump, head: Secondary | ICD-10-CM

## 2024-06-22 DIAGNOSIS — M25511 Pain in right shoulder: Secondary | ICD-10-CM

## 2024-06-22 DIAGNOSIS — I10 Essential (primary) hypertension: Secondary | ICD-10-CM | POA: Diagnosis not present

## 2024-06-22 DIAGNOSIS — M25512 Pain in left shoulder: Secondary | ICD-10-CM

## 2024-06-22 MED ORDER — PREDNISONE 20 MG PO TABS: 20.0000 mg | ORAL_TABLET | Freq: Every day | ORAL | 0 refills | Status: AC

## 2024-06-22 NOTE — Patient Instructions (Signed)
 It was very nice to see you today!  VISIT SUMMARY: During your visit, we addressed your facial swelling and shoulder pain. We also reviewed your hypertension management.  YOUR PLAN: ALLERGIC REACTION WITH FACIAL SWELLING: You experienced significant facial swelling, likely due to an allergic reaction. -We prescribed prednisone  for one week to help reduce the swelling. -Monitor for any recurrence of facial swelling. -If the swelling returns, we may need to consider discontinuing irbesartan .  BILATERAL SHOULDER OSTEOARTHRITIS: You have chronic pain in both shoulders, more severe on the left, likely due to osteoarthritis. -We prescribed prednisone  for one week to help with the pain. -If the pain persists, we may need to get x-rays. -We may also consider referring you to orthopedics or physical therapy if the pain does not improve.  PRIMARY HYPERTENSION: Your blood pressure is managed with irbesartan . -Continue taking irbesartan  as prescribed. -Monitor for any recurrence of facial swelling. -If the swelling returns, we may need to reevaluate your medication.  Return if symptoms worsen or fail to improve.   Take care, Dr Kennyth  PLEASE NOTE:  If you had any lab tests, please let us  know if you have not heard back within a few days. You may see your results on mychart before we have a chance to review them but we will give you a call once they are reviewed by us .   If we ordered any referrals today, please let us  know if you have not heard from their office within the next week.   If you had any urgent prescriptions sent in today, please check with the pharmacy within an hour of our visit to make sure the prescription was transmitted appropriately.   Please try these tips to maintain a healthy lifestyle:  Eat at least 3 REAL meals and 1-2 snacks per day.  Aim for no more than 5 hours between eating.  If you eat breakfast, please do so within one hour of getting up.   Each meal should  contain half fruits/vegetables, one quarter protein, and one quarter carbs (no bigger than a computer mouse)  Cut down on sweet beverages. This includes juice, soda, and sweet tea.   Drink at least 1 glass of water with each meal and aim for at least 8 glasses per day  Exercise at least 150 minutes every week.

## 2024-06-22 NOTE — Progress Notes (Signed)
 "  Lisa Greene is a 84 y.o. female who presents today for an office visit.  Assessment/Plan:  Facial Swelling However symptoms have improved significantly since yesterday.  No obvious swelling on exam however patient does feel like her face still feels a bit puffy especially on the right side.  This is most consistent with allergic reaction however we did discuss that she may have potentially had an episode of angioedema related to irbesartan .  She does not have any obvious exposures or other precipitating events.  We did discuss discontinuing her irbesartan  however given that symptoms have significantly improved she would like to continue for now.  Given her significant improvement over the last day or so, this is reasonable although we did discuss reasons to return to care and seek emergent care.  If she has any recurrent or persistent issues with facial swelling going forward discussed with patient we should probably stop the irbesartan  at that time.  We are starting a prednisone  burst as below for her shoulder pain which should help for any potential exposure that could have caused her symptoms.    Bilateral Shoulder Pain  Likely mild osteoarthritis flare.  May have some muscular involvement as well.  We discussed treatment options.  Will start prednisone  burst due to her chronic stage 3 kidney disease.  We did discuss imaging and potential referral to PT however she would like to hold off on this for now.  She will let us  know if not proved by next week and would consider imaging versus referral at that time  Hypertension Blood pressure is at goal today on irbesartan  150 mg daily and metoprolol  succinate 50 mg daily.  As above we did discuss discontinuing irbesartan  today due to concern for potential angioedema however given significant improvement in symptoms today we will continue for now.  Low threshold to discontinue in the future if she has any recurrence of facial swelling.  Allergies /  Asthma She is currently on Zyrtec 10 mg twice daily per allergy though she has no reported history of urticaria.  This may be contributing to above facial swelling as well.  She will continue her current treatment plan per allergy though did advise her to discuss with allergist at her next appointment with them.    Subjective:  HPI:  See assessment / plan for status of chronic conditions.    Discussed the use of AI scribe software for clinical note transcription with the patient, who gave verbal consent to proceed.  History of Present Illness Lisa Greene is an 84 year old female who presents with facial swelling and shoulder pain.  She experienced significant swelling on the right side of her face upon waking yesterday, which progressed to involve her entire face by this morning. The swelling has decreased somewhat since yesterday. There have been no new exposures to allergens or recent dietary changes. She discontinued her asthma inhaler two months ago. She takes Zyrtec 10 mg daily for allergies and has a history of asthma, though she has not used her inhaler for two months after a persistent cough resolved upon discontinuation.  She has bilateral shoulder pain, with the left shoulder being more painful than the right. The pain has been present for approximately six months, with no specific injury or trauma. The pain is described as sore, particularly in the muscles, and sometimes wakes her up at night. She has been taking Tylenol  for pain relief, which has been minimally effective. No stiffness or significant limitation  in movement, though she mentions that the left shoulder used to 'catch' but no longer does.  Her past medical history includes a stress fracture in her back, which causes pain in damp and cold weather, managed with Tylenol . She has a history of allergies confirmed by testing, showing sensitivity to certain trees, grass, and some foods. She avoids pork due to severe  reactions. She has been on irbesartan  for blood pressure management for a long time without previous issues.         Objective:  Physical Exam: BP 120/80   Pulse 69   Temp (!) 97.2 F (36.2 C) (Temporal)   Ht 5' 1 (1.549 m)   Wt 148 lb (67.1 kg)   SpO2 98%   BMI 27.96 kg/m   Gen: No acute distress, resting comfortably HEENT: No gross edema noted. OP clear.  CV: Regular rate and rhythm with no murmurs appreciated Pulm: Normal work of breathing, clear to auscultation bilaterally with no crackles, wheezes, or rhonchi.  Speaking in full sentences.  No stridor or signs of respiratory distress. MUSCULOSKELETAL: - Shoulders: No deformities.  Pain with palpation of anterior and lateral joints bilaterally.  Crepitus noted with active and passive range of motion.  Neer and Hawkens test negative.  Neurovascular intact distally. Neuro: Grossly normal, moves all extremities Psych: Normal affect and thought content      Sunita Demond M. Kennyth, MD 06/22/2024 9:11 AM  "

## 2024-07-06 ENCOUNTER — Ambulatory Visit: Admitting: Family Medicine

## 2024-07-06 ENCOUNTER — Encounter: Payer: Self-pay | Admitting: Family Medicine

## 2024-07-06 VITALS — BP 118/72 | HR 73 | Temp 97.8°F | Ht 61.0 in | Wt 150.2 lb

## 2024-07-06 DIAGNOSIS — I10 Essential (primary) hypertension: Secondary | ICD-10-CM

## 2024-07-06 DIAGNOSIS — B9689 Other specified bacterial agents as the cause of diseases classified elsewhere: Secondary | ICD-10-CM

## 2024-07-06 DIAGNOSIS — N1832 Chronic kidney disease, stage 3b: Secondary | ICD-10-CM

## 2024-07-06 MED ORDER — AMOXICILLIN-POT CLAVULANATE 875-125 MG PO TABS: 1.0000 | ORAL_TABLET | Freq: Two times a day (BID) | ORAL | 0 refills | Status: AC

## 2024-07-06 NOTE — Progress Notes (Signed)
 " Phone [phone] In person visit   Subjective:   Lisa Greene is a 84 y.o. year old very pleasant female patient who presents for/with See problem oriented charting Chief Complaint  Patient presents with   Medical Management of Chronic Issues    Pt in today for blood pressure check; not having shoulder pain or facial swelling today; runny nose, sneezing, losing voice from time to time for the past 3 weeks but feels she improving daily; was on prednisone  but has since finished it;    Hypertension    Past Medical History-  Patient Active Problem List   Diagnosis Date Noted   PVC (premature ventricular contraction) 04/14/2017    Priority: High   History of breast cancer- Breast cancer of lower-inner quadrant of left female breast 11/01/2015    Priority: High   Low vitamin B12 level 06/24/2016    Priority: Medium    Stage 3b chronic kidney disease (HCC) 10/22/2015    Priority: Medium    Asthma, moderate persistent, well-controlled 04/19/2014    Priority: Medium    Hypertension 11/25/2010    Priority: Medium    Hyperlipidemia 12/20/2006    Priority: Medium    Osteoporosis 12/20/2006    Priority: Medium    HSV-1 (herpes simplex virus 1) infection 04/28/2022    Priority: Low   Vasomotor rhinitis 05/10/2019    Priority: Low   Chronic allergic conjunctivitis 05/10/2019    Priority: Low   Aortic atherosclerosis 11/23/2016    Priority: Low   Family history of abdominal aortic aneurysm 03/08/2015    Priority: Low   Eosinophilia 02/08/2009    Priority: Low   Allergic rhinitis 01/31/2008    Priority: Low   Lumbar facet arthropathy 08/11/2022    Priority: 1.   Closed compression fracture of L2 lumbar vertebra, with routine healing, subsequent encounter 08/11/2022    Priority: 1.   Pain in left ankle and joints of left foot 06/18/2021    Priority: 1.   Edema of left lower extremity 02/22/2024    Medications- reviewed and updated Current Outpatient Medications   Medication Sig Dispense Refill   Albuterol  Sulfate (PROAIR  RESPICLICK) 108 (90 Base) MCG/ACT AEPB Inhale 1 puff into the lungs every 8 (eight) hours as needed (wheezing/shortness of breath).     allopurinol  (ZYLOPRIM ) 100 MG tablet Take 0.5 tablets (50 mg total) by mouth daily. 30 tablet 3   amoxicillin -clavulanate (AUGMENTIN ) 875-125 MG tablet Take 1 tablet by mouth 2 (two) times daily for 7 days. Tolerates although penicillin allergy listed 14 tablet 0   atorvastatin  (LIPITOR) 20 MG tablet Take 1 tablet (20 mg total) by mouth daily. 90 tablet 3   azelastine (OPTIVAR) 0.05 % ophthalmic solution      cetirizine (ZYRTEC) 10 MG tablet Take 10 mg by mouth daily.     cholecalciferol (VITAMIN D ) 1000 UNITS tablet Take 2,000 Units by mouth daily.     Cyanocobalamin  (VITAMIN B 12 PO) Take 1,000 mg by mouth every other day.     denosumab  (PROLIA ) 60 MG/ML SOSY injection INJECT 60 MG INTO THE SKIN EVERY 6 (SIX) MONTHS. 1 mL 1   fluticasone  (FLONASE ) 50 MCG/ACT nasal spray Place 2 sprays into both nostrils daily. 48 g 3   irbesartan  (AVAPRO ) 150 MG tablet TAKE 1 TABLET BY MOUTH DAILY 90 tablet 3   metoprolol  succinate (TOPROL -XL) 50 MG 24 hr tablet TAKE 1 TABLET BY MOUTH DAILY 90 tablet 3   montelukast (SINGULAIR) 10 MG tablet Take 1 tablet by  mouth at bedtime.     omeprazole  (PRILOSEC) 40 MG capsule Take 1 capsule (40 mg total) by mouth daily. Treating for reflux potentially causing cough- does not have traditional reflux symptoms 30 capsule 3   predniSONE  (DELTASONE ) 20 MG tablet Take 1 tablet (20 mg total) by mouth daily with breakfast. (Patient not taking: Reported on 07/06/2024) 7 tablet 0   Current Facility-Administered Medications  Medication Dose Route Frequency Provider Last Rate Last Admin   denosumab  (PROLIA ) injection 60 mg  60 mg Subcutaneous Q6 months Wendolyn Jenkins Jansky, MD   60 mg at 09/14/23 0912   denosumab  (PROLIA ) injection 60 mg  60 mg Subcutaneous Once Kulik, Ann Marie, MD        denosumab  (PROLIA ) injection 60 mg  60 mg Subcutaneous Once Kulik, Ann Marie, MD       denosumab  (PROLIA ) injection 60 mg  60 mg Subcutaneous Q6 months Katrinka Garnette KIDD, MD   60 mg at 03/28/24 1005   denosumab  (PROLIA ) injection 60 mg  60 mg Subcutaneous Q6 months Katrinka Garnette KIDD, MD         Objective:  BP 118/72 (BP Location: Left Arm, Patient Position: Sitting, Cuff Size: Normal)   Pulse 73   Temp 97.8 F (36.6 C) (Temporal)   Ht 5' 1 (1.549 m)   Wt 150 lb 3.2 oz (68.1 kg)   SpO2 96%   BMI 28.38 kg/m  Gen: NAD, resting comfortably Bilateral maxillary and frontal sinus tenderness.  Tympanic membranes normal bilaterally.  Nasal turbinates mildly edematous.  Pharynx with minimal drainage CV: RRR no murmurs rubs or gallops Lungs: CTAB no crackles, wheeze, rhonchi Ext: no edema Skin: warm, dry     Assessment and Plan   # Sinus pressure S:symptoms about 2 weeks including runny nose, sneezing, some hoarseness. Symptom(s) started when she improved the prednisone . No cough. Slight temperature at first but has resolved- had chills but resolved. Tylenol  helped slightly with feeling run down. Now her runny nose has stopped. Still sounds somewhat congested. Some sinus pressure. Mild throat discomfort A/P:  2 weeks of sinus congestion/sinus pressure but improving though still with significant sinus tenderness on exam- could be viral sinusitis that is going to continue to improve but if fails to improve she is going to take the Augmentin  that I sent in today- technically at 2 weeks she could go ahead and take this for bacterial sinusitis --She has penicillin allergy listed from childhood but has tolerated Augmentin  previously-she is willing to retrial this today given other allergies and limitations    #cough- prior cough resolved off Breztri . No wheezing or shortness of breath with her being off the medicine either- only albuterol  as needed and not needing  #hypertension with white coat  element/PVCs on metoprolol  S: medication: irbesartan  150mg , metoprolol  50 mg XR -Prior facial edema was likely allergic-resolved fully on prednisone  and has not had recurrence.  We do not think this is related to her irbesartan  ultimately -She also reports her shoulder is better-see last visit with Dr. Kennyth  BP Readings from Last 3 Encounters:  07/06/24 118/72  06/22/24 120/80  04/03/24 128/60  Assessment / plan: Blood pressure is well-controlled-continue current medication   #Chronic kidney disease stage III S: GFR is typically in the 40s range-most recently at 85 -Patient knows to avoid NSAIDs  -hold irbesartan  if gets dehydrated A/P: Overall has been stable-already scheduled for labs in March and will be rechecked at that time  Recommended follow up: Return for as  needed for new, worsening, persistent symptoms.  Otherwise keep March visit with labs beforehand Future Appointments  Date Time Provider Department Center  08/08/2024  9:00 AM [facility] LAB [facility]  08/16/2024  8:00 AM Katrinka Garnette KIDD, MD [facility]  12/11/2024  9:20 AM [facility] ANNUAL WELLNESS VISIT 1 [facility] [place]    Lab/Order associations:   ICD-10-CM   1. Bacterial sinusitis  J32.9    B96.89     2. Primary hypertension  I10     3. Stage 3b chronic kidney disease (HCC)  N18.32       Meds ordered this encounter  Medications   amoxicillin -clavulanate (AUGMENTIN ) 875-125 MG tablet    Sig: Take 1 tablet by mouth 2 (two) times daily for 7 days. Tolerates although penicillin allergy listed    Dispense:  14 tablet    Refill:  0    Return precautions advised.  Garnette Katrinka, MD  "

## 2024-07-06 NOTE — Patient Instructions (Addendum)
 2 weeks of sinus congestion/sinus pressure but improving- could be viral sinusitis that is going to continue to improve but if fails to improve she is going to take the Augmentin  that I sent in today- technically at 2 weeks she could go ahead and take this for bacterial sinsusitis   Recommended follow up: Return for as needed for new, worsening, persistent symptoms.

## 2024-07-13 ENCOUNTER — Ambulatory Visit: Admitting: Family Medicine

## 2024-07-17 ENCOUNTER — Ambulatory Visit: Admitting: Family Medicine

## 2024-08-08 ENCOUNTER — Other Ambulatory Visit

## 2024-08-16 ENCOUNTER — Encounter: Admitting: Family Medicine

## 2024-12-11 ENCOUNTER — Ambulatory Visit
